# Patient Record
Sex: Female | Born: 1935 | Race: Black or African American | Hispanic: No | Smoking: Former smoker
Health system: Southern US, Community
[De-identification: ages and names within clinical notes are randomized; demographics above are authoritative.]

## PROBLEM LIST (undated history)

## (undated) DIAGNOSIS — I10 Essential (primary) hypertension: Secondary | ICD-10-CM

## (undated) DIAGNOSIS — J449 Chronic obstructive pulmonary disease, unspecified: Secondary | ICD-10-CM

## (undated) DIAGNOSIS — E875 Hyperkalemia: Secondary | ICD-10-CM

## (undated) DIAGNOSIS — J9690 Respiratory failure, unspecified, unspecified whether with hypoxia or hypercapnia: Secondary | ICD-10-CM

## (undated) DIAGNOSIS — N289 Disorder of kidney and ureter, unspecified: Secondary | ICD-10-CM

## (undated) DIAGNOSIS — E119 Type 2 diabetes mellitus without complications: Secondary | ICD-10-CM

## (undated) DIAGNOSIS — J40 Bronchitis, not specified as acute or chronic: Secondary | ICD-10-CM

## (undated) DIAGNOSIS — M1990 Unspecified osteoarthritis, unspecified site: Secondary | ICD-10-CM

## (undated) DIAGNOSIS — F0390 Unspecified dementia without behavioral disturbance: Secondary | ICD-10-CM

## (undated) DIAGNOSIS — I509 Heart failure, unspecified: Secondary | ICD-10-CM

## (undated) HISTORY — DX: Bronchitis, not specified as acute or chronic: J40

## (undated) HISTORY — PX: OTHER SURGICAL HISTORY: SHX169

---

## 1973-06-01 HISTORY — PX: ABDOMINAL HYSTERECTOMY: SHX81

## 2000-10-07 ENCOUNTER — Ambulatory Visit (HOSPITAL_COMMUNITY): Admission: RE | Admit: 2000-10-07 | Discharge: 2000-10-07 | Payer: Self-pay | Admitting: Orthopaedic Surgery

## 2000-11-12 ENCOUNTER — Ambulatory Visit (HOSPITAL_COMMUNITY): Admission: RE | Admit: 2000-11-12 | Discharge: 2000-11-12 | Payer: Self-pay | Admitting: Orthopaedic Surgery

## 2000-11-12 ENCOUNTER — Encounter: Payer: Self-pay | Admitting: Orthopaedic Surgery

## 2000-12-20 ENCOUNTER — Encounter: Payer: Self-pay | Admitting: Family Medicine

## 2000-12-20 ENCOUNTER — Ambulatory Visit (HOSPITAL_COMMUNITY): Admission: RE | Admit: 2000-12-20 | Discharge: 2000-12-20 | Payer: Self-pay | Admitting: Family Medicine

## 2001-06-29 ENCOUNTER — Encounter (HOSPITAL_COMMUNITY): Admission: RE | Admit: 2001-06-29 | Discharge: 2001-07-29 | Payer: Self-pay | Admitting: Orthopedic Surgery

## 2002-01-11 ENCOUNTER — Encounter: Payer: Self-pay | Admitting: Family Medicine

## 2002-01-11 ENCOUNTER — Ambulatory Visit (HOSPITAL_COMMUNITY): Admission: RE | Admit: 2002-01-11 | Discharge: 2002-01-11 | Payer: Self-pay | Admitting: Family Medicine

## 2002-03-01 ENCOUNTER — Encounter: Payer: Self-pay | Admitting: Family Medicine

## 2002-03-01 ENCOUNTER — Ambulatory Visit (HOSPITAL_COMMUNITY): Admission: RE | Admit: 2002-03-01 | Discharge: 2002-03-01 | Payer: Self-pay | Admitting: Family Medicine

## 2002-03-15 ENCOUNTER — Other Ambulatory Visit: Admission: RE | Admit: 2002-03-15 | Discharge: 2002-03-15 | Payer: Self-pay | Admitting: Dermatology

## 2003-03-01 ENCOUNTER — Encounter: Payer: Self-pay | Admitting: Orthopedic Surgery

## 2003-03-14 ENCOUNTER — Encounter: Payer: Self-pay | Admitting: Family Medicine

## 2003-03-14 ENCOUNTER — Ambulatory Visit (HOSPITAL_COMMUNITY): Admission: RE | Admit: 2003-03-14 | Discharge: 2003-03-14 | Payer: Self-pay | Admitting: Family Medicine

## 2003-11-23 ENCOUNTER — Ambulatory Visit (HOSPITAL_COMMUNITY): Admission: RE | Admit: 2003-11-23 | Discharge: 2003-11-23 | Payer: Self-pay | Admitting: Family Medicine

## 2004-03-17 ENCOUNTER — Ambulatory Visit (HOSPITAL_COMMUNITY): Admission: RE | Admit: 2004-03-17 | Discharge: 2004-03-17 | Payer: Self-pay | Admitting: Family Medicine

## 2004-04-21 ENCOUNTER — Ambulatory Visit: Payer: Self-pay | Admitting: Orthopedic Surgery

## 2004-04-30 ENCOUNTER — Ambulatory Visit (HOSPITAL_COMMUNITY): Admission: RE | Admit: 2004-04-30 | Discharge: 2004-04-30 | Payer: Self-pay | Admitting: Orthopedic Surgery

## 2004-07-21 ENCOUNTER — Ambulatory Visit: Payer: Self-pay | Admitting: Orthopedic Surgery

## 2004-07-31 ENCOUNTER — Encounter: Admission: RE | Admit: 2004-07-31 | Discharge: 2004-07-31 | Payer: Self-pay | Admitting: Orthopedic Surgery

## 2004-08-19 ENCOUNTER — Encounter: Admission: RE | Admit: 2004-08-19 | Discharge: 2004-08-19 | Payer: Self-pay | Admitting: Orthopedic Surgery

## 2004-09-04 ENCOUNTER — Encounter: Admission: RE | Admit: 2004-09-04 | Discharge: 2004-09-04 | Payer: Self-pay | Admitting: Orthopedic Surgery

## 2005-01-14 ENCOUNTER — Ambulatory Visit: Payer: Self-pay | Admitting: Orthopedic Surgery

## 2005-03-19 ENCOUNTER — Ambulatory Visit (HOSPITAL_COMMUNITY): Admission: RE | Admit: 2005-03-19 | Discharge: 2005-03-19 | Payer: Self-pay | Admitting: Family Medicine

## 2006-01-06 ENCOUNTER — Ambulatory Visit: Payer: Self-pay | Admitting: Orthopedic Surgery

## 2006-02-11 ENCOUNTER — Ambulatory Visit: Payer: Self-pay | Admitting: Internal Medicine

## 2006-02-15 ENCOUNTER — Encounter (INDEPENDENT_AMBULATORY_CARE_PROVIDER_SITE_OTHER): Payer: Self-pay | Admitting: Internal Medicine

## 2006-03-09 ENCOUNTER — Ambulatory Visit (HOSPITAL_COMMUNITY): Admission: RE | Admit: 2006-03-09 | Discharge: 2006-03-09 | Payer: Self-pay | Admitting: Ophthalmology

## 2006-03-11 ENCOUNTER — Ambulatory Visit: Payer: Self-pay | Admitting: Internal Medicine

## 2006-03-11 LAB — CONVERTED CEMR LAB
ALT: 18 units/L
Alkaline Phosphatase: 73 units/L
Glucose, Bld: 109 mg/dL
Sodium: 141 meq/L
Total Bilirubin: 0.5 mg/dL
Total CHOL/HDL Ratio: 3.1
Total Protein: 7.4 g/dL
Triglycerides: 108 mg/dL
VLDL: 22 mg/dL

## 2006-03-25 ENCOUNTER — Ambulatory Visit (HOSPITAL_COMMUNITY): Admission: RE | Admit: 2006-03-25 | Discharge: 2006-03-25 | Payer: Self-pay | Admitting: Internal Medicine

## 2006-03-26 ENCOUNTER — Encounter (INDEPENDENT_AMBULATORY_CARE_PROVIDER_SITE_OTHER): Payer: Self-pay | Admitting: Internal Medicine

## 2006-04-16 ENCOUNTER — Ambulatory Visit: Payer: Self-pay | Admitting: Internal Medicine

## 2006-04-16 LAB — CONVERTED CEMR LAB
Microalb Creat Ratio: 10.4 mg/g
Microalb, Ur: 2.41 mg/dL

## 2006-04-27 ENCOUNTER — Encounter (INDEPENDENT_AMBULATORY_CARE_PROVIDER_SITE_OTHER): Payer: Self-pay | Admitting: Internal Medicine

## 2006-04-27 DIAGNOSIS — R32 Unspecified urinary incontinence: Secondary | ICD-10-CM

## 2006-04-27 DIAGNOSIS — I10 Essential (primary) hypertension: Secondary | ICD-10-CM | POA: Insufficient documentation

## 2006-04-27 DIAGNOSIS — K219 Gastro-esophageal reflux disease without esophagitis: Secondary | ICD-10-CM | POA: Insufficient documentation

## 2006-04-27 DIAGNOSIS — J45909 Unspecified asthma, uncomplicated: Secondary | ICD-10-CM

## 2006-04-27 DIAGNOSIS — E785 Hyperlipidemia, unspecified: Secondary | ICD-10-CM | POA: Insufficient documentation

## 2006-04-27 DIAGNOSIS — Z8679 Personal history of other diseases of the circulatory system: Secondary | ICD-10-CM | POA: Insufficient documentation

## 2006-04-27 DIAGNOSIS — J309 Allergic rhinitis, unspecified: Secondary | ICD-10-CM | POA: Insufficient documentation

## 2006-04-27 DIAGNOSIS — G5600 Carpal tunnel syndrome, unspecified upper limb: Secondary | ICD-10-CM

## 2006-04-27 DIAGNOSIS — N6019 Diffuse cystic mastopathy of unspecified breast: Secondary | ICD-10-CM

## 2006-04-27 DIAGNOSIS — J42 Unspecified chronic bronchitis: Secondary | ICD-10-CM

## 2006-06-11 ENCOUNTER — Ambulatory Visit: Payer: Self-pay | Admitting: Internal Medicine

## 2006-06-11 LAB — CONVERTED CEMR LAB: Hgb A1c MFr Bld: 5.6 %

## 2006-06-14 ENCOUNTER — Encounter (INDEPENDENT_AMBULATORY_CARE_PROVIDER_SITE_OTHER): Payer: Self-pay | Admitting: Internal Medicine

## 2006-06-21 ENCOUNTER — Encounter (INDEPENDENT_AMBULATORY_CARE_PROVIDER_SITE_OTHER): Payer: Self-pay | Admitting: Internal Medicine

## 2006-08-02 ENCOUNTER — Encounter (INDEPENDENT_AMBULATORY_CARE_PROVIDER_SITE_OTHER): Payer: Self-pay | Admitting: Internal Medicine

## 2006-08-25 ENCOUNTER — Telehealth (INDEPENDENT_AMBULATORY_CARE_PROVIDER_SITE_OTHER): Payer: Self-pay | Admitting: *Deleted

## 2006-09-10 ENCOUNTER — Ambulatory Visit: Payer: Self-pay | Admitting: Internal Medicine

## 2006-09-10 DIAGNOSIS — R634 Abnormal weight loss: Secondary | ICD-10-CM

## 2006-09-10 DIAGNOSIS — R42 Dizziness and giddiness: Secondary | ICD-10-CM | POA: Insufficient documentation

## 2006-09-10 LAB — CONVERTED CEMR LAB: Hgb A1c MFr Bld: 5.9 %

## 2006-09-13 LAB — CONVERTED CEMR LAB
ALT: 10 units/L (ref 0–35)
Albumin: 4.2 g/dL (ref 3.5–5.2)
Basophils Absolute: 0 10*3/uL (ref 0.0–0.1)
CO2: 26 meq/L (ref 19–32)
Calcium: 9.6 mg/dL (ref 8.4–10.5)
Chloride: 101 meq/L (ref 96–112)
Hemoglobin: 13.1 g/dL (ref 12.0–15.0)
Lymphocytes Relative: 42 % (ref 12–46)
Monocytes Absolute: 0.5 10*3/uL (ref 0.2–0.7)
Neutro Abs: 3.3 10*3/uL (ref 1.7–7.7)
Platelets: 243 10*3/uL (ref 150–400)
RDW: 14.2 % — ABNORMAL HIGH (ref 11.5–14.0)
Sodium: 141 meq/L (ref 135–145)
TSH: 0.825 microintl units/mL (ref 0.350–5.50)
Total Protein: 7.4 g/dL (ref 6.0–8.3)

## 2006-09-17 ENCOUNTER — Ambulatory Visit (HOSPITAL_COMMUNITY): Admission: RE | Admit: 2006-09-17 | Discharge: 2006-09-17 | Payer: Self-pay | Admitting: Family Medicine

## 2006-09-22 ENCOUNTER — Encounter (INDEPENDENT_AMBULATORY_CARE_PROVIDER_SITE_OTHER): Payer: Self-pay | Admitting: Internal Medicine

## 2006-09-24 ENCOUNTER — Ambulatory Visit: Payer: Self-pay | Admitting: Internal Medicine

## 2006-09-24 DIAGNOSIS — J45901 Unspecified asthma with (acute) exacerbation: Secondary | ICD-10-CM | POA: Insufficient documentation

## 2006-10-05 ENCOUNTER — Encounter (INDEPENDENT_AMBULATORY_CARE_PROVIDER_SITE_OTHER): Payer: Self-pay | Admitting: Internal Medicine

## 2006-10-08 ENCOUNTER — Ambulatory Visit: Payer: Self-pay | Admitting: Internal Medicine

## 2006-10-08 DIAGNOSIS — K5909 Other constipation: Secondary | ICD-10-CM

## 2006-10-11 ENCOUNTER — Encounter (INDEPENDENT_AMBULATORY_CARE_PROVIDER_SITE_OTHER): Payer: Self-pay | Admitting: Internal Medicine

## 2006-11-05 ENCOUNTER — Ambulatory Visit: Payer: Self-pay | Admitting: Internal Medicine

## 2006-11-05 DIAGNOSIS — F0390 Unspecified dementia without behavioral disturbance: Secondary | ICD-10-CM

## 2006-11-29 ENCOUNTER — Ambulatory Visit: Payer: Self-pay | Admitting: Internal Medicine

## 2006-11-29 DIAGNOSIS — F329 Major depressive disorder, single episode, unspecified: Secondary | ICD-10-CM

## 2006-11-30 ENCOUNTER — Telehealth (INDEPENDENT_AMBULATORY_CARE_PROVIDER_SITE_OTHER): Payer: Self-pay | Admitting: Internal Medicine

## 2006-12-01 ENCOUNTER — Encounter (INDEPENDENT_AMBULATORY_CARE_PROVIDER_SITE_OTHER): Payer: Self-pay | Admitting: Internal Medicine

## 2006-12-28 ENCOUNTER — Ambulatory Visit: Payer: Self-pay | Admitting: Internal Medicine

## 2006-12-28 DIAGNOSIS — M6530 Trigger finger, unspecified finger: Secondary | ICD-10-CM | POA: Insufficient documentation

## 2007-01-03 ENCOUNTER — Ambulatory Visit: Payer: Self-pay | Admitting: Orthopedic Surgery

## 2007-01-03 ENCOUNTER — Encounter (INDEPENDENT_AMBULATORY_CARE_PROVIDER_SITE_OTHER): Payer: Self-pay | Admitting: Internal Medicine

## 2007-01-10 ENCOUNTER — Telehealth (INDEPENDENT_AMBULATORY_CARE_PROVIDER_SITE_OTHER): Payer: Self-pay | Admitting: *Deleted

## 2007-01-22 ENCOUNTER — Encounter (INDEPENDENT_AMBULATORY_CARE_PROVIDER_SITE_OTHER): Payer: Self-pay | Admitting: Internal Medicine

## 2007-01-23 ENCOUNTER — Encounter (INDEPENDENT_AMBULATORY_CARE_PROVIDER_SITE_OTHER): Payer: Self-pay | Admitting: Internal Medicine

## 2007-02-02 ENCOUNTER — Ambulatory Visit: Payer: Self-pay | Admitting: Internal Medicine

## 2007-02-08 ENCOUNTER — Ambulatory Visit: Payer: Self-pay | Admitting: Internal Medicine

## 2007-02-08 ENCOUNTER — Telehealth (INDEPENDENT_AMBULATORY_CARE_PROVIDER_SITE_OTHER): Payer: Self-pay | Admitting: *Deleted

## 2007-02-08 DIAGNOSIS — E1149 Type 2 diabetes mellitus with other diabetic neurological complication: Secondary | ICD-10-CM | POA: Insufficient documentation

## 2007-02-10 ENCOUNTER — Telehealth (INDEPENDENT_AMBULATORY_CARE_PROVIDER_SITE_OTHER): Payer: Self-pay | Admitting: *Deleted

## 2007-03-03 ENCOUNTER — Encounter (INDEPENDENT_AMBULATORY_CARE_PROVIDER_SITE_OTHER): Payer: Self-pay | Admitting: Internal Medicine

## 2007-03-25 ENCOUNTER — Ambulatory Visit: Payer: Self-pay | Admitting: Internal Medicine

## 2007-03-28 ENCOUNTER — Ambulatory Visit (HOSPITAL_COMMUNITY): Admission: RE | Admit: 2007-03-28 | Discharge: 2007-03-28 | Payer: Self-pay | Admitting: Internal Medicine

## 2007-03-29 ENCOUNTER — Telehealth (INDEPENDENT_AMBULATORY_CARE_PROVIDER_SITE_OTHER): Payer: Self-pay | Admitting: *Deleted

## 2007-03-29 ENCOUNTER — Ambulatory Visit: Payer: Self-pay | Admitting: Internal Medicine

## 2007-03-30 ENCOUNTER — Encounter (INDEPENDENT_AMBULATORY_CARE_PROVIDER_SITE_OTHER): Payer: Self-pay | Admitting: Internal Medicine

## 2007-03-30 ENCOUNTER — Telehealth (INDEPENDENT_AMBULATORY_CARE_PROVIDER_SITE_OTHER): Payer: Self-pay | Admitting: *Deleted

## 2007-03-30 LAB — CONVERTED CEMR LAB
Albumin: 4.1 g/dL (ref 3.5–5.2)
Alkaline Phosphatase: 103 units/L (ref 39–117)
BUN: 19 mg/dL (ref 6–23)
CO2: 27 meq/L (ref 19–32)
Cholesterol: 138 mg/dL (ref 0–200)
Eosinophils Absolute: 0.2 10*3/uL (ref 0.0–0.7)
Eosinophils Relative: 3 % (ref 0–5)
Glucose, Bld: 102 mg/dL — ABNORMAL HIGH (ref 70–99)
HCT: 37.7 % (ref 36.0–46.0)
HDL: 45 mg/dL (ref >39)
LDL Cholesterol: 68 mg/dL (ref 0–99)
Lymphs Abs: 2.9 10*3/uL (ref 0.7–3.3)
MCV: 82.5 fL (ref 78.0–100.0)
Monocytes Relative: 8 % (ref 3–11)
Potassium: 3.7 meq/L (ref 3.5–5.3)
RBC: 4.57 M/uL (ref 3.87–5.11)
Total Bilirubin: 0.3 mg/dL (ref 0.3–1.2)
Triglycerides: 126 mg/dL (ref <150)
WBC: 6.7 10*3/uL (ref 4.0–10.5)

## 2007-04-13 ENCOUNTER — Encounter (INDEPENDENT_AMBULATORY_CARE_PROVIDER_SITE_OTHER): Payer: Self-pay | Admitting: Internal Medicine

## 2007-04-19 ENCOUNTER — Encounter (INDEPENDENT_AMBULATORY_CARE_PROVIDER_SITE_OTHER): Payer: Self-pay | Admitting: Internal Medicine

## 2007-05-19 ENCOUNTER — Encounter (INDEPENDENT_AMBULATORY_CARE_PROVIDER_SITE_OTHER): Payer: Self-pay | Admitting: Internal Medicine

## 2007-05-23 ENCOUNTER — Encounter (INDEPENDENT_AMBULATORY_CARE_PROVIDER_SITE_OTHER): Payer: Self-pay | Admitting: Internal Medicine

## 2007-05-31 ENCOUNTER — Encounter (INDEPENDENT_AMBULATORY_CARE_PROVIDER_SITE_OTHER): Payer: Self-pay | Admitting: Internal Medicine

## 2007-06-03 ENCOUNTER — Encounter (INDEPENDENT_AMBULATORY_CARE_PROVIDER_SITE_OTHER): Payer: Self-pay | Admitting: Internal Medicine

## 2007-06-06 ENCOUNTER — Ambulatory Visit: Payer: Self-pay | Admitting: Internal Medicine

## 2007-06-14 ENCOUNTER — Ambulatory Visit (HOSPITAL_COMMUNITY): Admission: RE | Admit: 2007-06-14 | Discharge: 2007-06-14 | Payer: Self-pay | Admitting: Ophthalmology

## 2007-06-28 ENCOUNTER — Ambulatory Visit: Payer: Self-pay | Admitting: Internal Medicine

## 2007-06-29 ENCOUNTER — Telehealth (INDEPENDENT_AMBULATORY_CARE_PROVIDER_SITE_OTHER): Payer: Self-pay | Admitting: *Deleted

## 2007-07-11 ENCOUNTER — Encounter (INDEPENDENT_AMBULATORY_CARE_PROVIDER_SITE_OTHER): Payer: Self-pay | Admitting: Internal Medicine

## 2007-07-29 ENCOUNTER — Encounter (INDEPENDENT_AMBULATORY_CARE_PROVIDER_SITE_OTHER): Payer: Self-pay | Admitting: Internal Medicine

## 2007-09-26 ENCOUNTER — Ambulatory Visit: Payer: Self-pay | Admitting: Internal Medicine

## 2007-09-26 LAB — CONVERTED CEMR LAB: Hgb A1c MFr Bld: 5.9 %

## 2007-09-27 ENCOUNTER — Encounter (INDEPENDENT_AMBULATORY_CARE_PROVIDER_SITE_OTHER): Payer: Self-pay | Admitting: Internal Medicine

## 2007-09-27 ENCOUNTER — Telehealth (INDEPENDENT_AMBULATORY_CARE_PROVIDER_SITE_OTHER): Payer: Self-pay | Admitting: *Deleted

## 2007-09-27 LAB — CONVERTED CEMR LAB
ALT: 14 units/L (ref 0–35)
AST: 20 units/L (ref 0–37)
Albumin: 3.9 g/dL (ref 3.5–5.2)
CO2: 25 meq/L (ref 19–32)
Calcium: 9 mg/dL (ref 8.4–10.5)
Chloride: 103 meq/L (ref 96–112)
Cholesterol: 121 mg/dL (ref 0–200)
Creatinine, Ser: 1.1 mg/dL (ref 0.40–1.20)
Potassium: 4 meq/L (ref 3.5–5.3)
Sodium: 142 meq/L (ref 135–145)
Total Protein: 7.4 g/dL (ref 6.0–8.3)

## 2007-09-30 ENCOUNTER — Encounter (INDEPENDENT_AMBULATORY_CARE_PROVIDER_SITE_OTHER): Payer: Self-pay | Admitting: Internal Medicine

## 2007-10-03 ENCOUNTER — Ambulatory Visit: Payer: Self-pay | Admitting: Internal Medicine

## 2007-11-14 ENCOUNTER — Ambulatory Visit: Payer: Self-pay | Admitting: Orthopedic Surgery

## 2007-11-14 DIAGNOSIS — S73119A Iliofemoral ligament sprain of unspecified hip, initial encounter: Secondary | ICD-10-CM | POA: Insufficient documentation

## 2007-12-01 ENCOUNTER — Ambulatory Visit: Payer: Self-pay | Admitting: Internal Medicine

## 2007-12-01 LAB — CONVERTED CEMR LAB
Blood Glucose, Fingerstick: 124
Hgb A1c MFr Bld: 5.9 %

## 2007-12-13 ENCOUNTER — Telehealth (INDEPENDENT_AMBULATORY_CARE_PROVIDER_SITE_OTHER): Payer: Self-pay | Admitting: Internal Medicine

## 2007-12-14 ENCOUNTER — Encounter (INDEPENDENT_AMBULATORY_CARE_PROVIDER_SITE_OTHER): Payer: Self-pay | Admitting: Internal Medicine

## 2007-12-26 ENCOUNTER — Ambulatory Visit: Payer: Self-pay | Admitting: Internal Medicine

## 2008-01-16 ENCOUNTER — Encounter (INDEPENDENT_AMBULATORY_CARE_PROVIDER_SITE_OTHER): Payer: Self-pay | Admitting: Internal Medicine

## 2008-01-31 ENCOUNTER — Encounter (INDEPENDENT_AMBULATORY_CARE_PROVIDER_SITE_OTHER): Payer: Self-pay | Admitting: Internal Medicine

## 2008-02-07 ENCOUNTER — Telehealth (INDEPENDENT_AMBULATORY_CARE_PROVIDER_SITE_OTHER): Payer: Self-pay | Admitting: *Deleted

## 2008-02-08 ENCOUNTER — Ambulatory Visit: Payer: Self-pay | Admitting: Internal Medicine

## 2008-02-15 ENCOUNTER — Ambulatory Visit: Payer: Self-pay | Admitting: Internal Medicine

## 2008-02-15 ENCOUNTER — Ambulatory Visit (HOSPITAL_COMMUNITY): Admission: RE | Admit: 2008-02-15 | Discharge: 2008-02-15 | Payer: Self-pay | Admitting: Internal Medicine

## 2008-02-15 DIAGNOSIS — R05 Cough: Secondary | ICD-10-CM | POA: Insufficient documentation

## 2008-02-22 ENCOUNTER — Ambulatory Visit: Payer: Self-pay | Admitting: Internal Medicine

## 2008-02-28 ENCOUNTER — Ambulatory Visit: Payer: Self-pay | Admitting: Internal Medicine

## 2008-03-07 ENCOUNTER — Ambulatory Visit: Payer: Self-pay | Admitting: Internal Medicine

## 2008-03-07 LAB — CONVERTED CEMR LAB
Blood Glucose, Fingerstick: 203
Hgb A1c MFr Bld: 6.2 %

## 2008-03-22 ENCOUNTER — Encounter (INDEPENDENT_AMBULATORY_CARE_PROVIDER_SITE_OTHER): Payer: Self-pay | Admitting: Internal Medicine

## 2008-03-27 ENCOUNTER — Ambulatory Visit: Payer: Self-pay | Admitting: Internal Medicine

## 2008-03-28 ENCOUNTER — Encounter (INDEPENDENT_AMBULATORY_CARE_PROVIDER_SITE_OTHER): Payer: Self-pay | Admitting: Internal Medicine

## 2008-03-28 ENCOUNTER — Ambulatory Visit (HOSPITAL_COMMUNITY): Admission: RE | Admit: 2008-03-28 | Discharge: 2008-03-28 | Payer: Self-pay | Admitting: Internal Medicine

## 2008-03-29 LAB — CONVERTED CEMR LAB
AST: 23 units/L (ref 0–37)
Albumin: 4 g/dL (ref 3.5–5.2)
Alkaline Phosphatase: 80 units/L (ref 39–117)
LDL Cholesterol: 63 mg/dL (ref 0–99)
Potassium: 3.9 meq/L (ref 3.5–5.3)
Sodium: 143 meq/L (ref 135–145)
Total Bilirubin: 0.4 mg/dL (ref 0.3–1.2)
Total Protein: 6.8 g/dL (ref 6.0–8.3)
Triglycerides: 91 mg/dL (ref <150)
VLDL: 18 mg/dL (ref 0–40)

## 2008-04-04 ENCOUNTER — Encounter (INDEPENDENT_AMBULATORY_CARE_PROVIDER_SITE_OTHER): Payer: Self-pay | Admitting: Internal Medicine

## 2008-04-10 ENCOUNTER — Telehealth (INDEPENDENT_AMBULATORY_CARE_PROVIDER_SITE_OTHER): Payer: Self-pay | Admitting: Internal Medicine

## 2008-04-24 ENCOUNTER — Encounter (INDEPENDENT_AMBULATORY_CARE_PROVIDER_SITE_OTHER): Payer: Self-pay | Admitting: Internal Medicine

## 2008-05-22 ENCOUNTER — Encounter (INDEPENDENT_AMBULATORY_CARE_PROVIDER_SITE_OTHER): Payer: Self-pay | Admitting: Internal Medicine

## 2008-06-04 ENCOUNTER — Telehealth (INDEPENDENT_AMBULATORY_CARE_PROVIDER_SITE_OTHER): Payer: Self-pay | Admitting: *Deleted

## 2008-06-07 ENCOUNTER — Ambulatory Visit: Payer: Self-pay | Admitting: Internal Medicine

## 2008-06-07 LAB — CONVERTED CEMR LAB: Blood Glucose, Fingerstick: 116

## 2008-06-13 ENCOUNTER — Ambulatory Visit: Payer: Self-pay | Admitting: Internal Medicine

## 2008-06-19 ENCOUNTER — Telehealth (INDEPENDENT_AMBULATORY_CARE_PROVIDER_SITE_OTHER): Payer: Self-pay | Admitting: *Deleted

## 2008-08-08 ENCOUNTER — Encounter (INDEPENDENT_AMBULATORY_CARE_PROVIDER_SITE_OTHER): Payer: Self-pay | Admitting: Internal Medicine

## 2008-08-10 ENCOUNTER — Ambulatory Visit: Payer: Self-pay | Admitting: Internal Medicine

## 2008-09-06 ENCOUNTER — Ambulatory Visit: Payer: Self-pay | Admitting: Internal Medicine

## 2008-09-06 DIAGNOSIS — R635 Abnormal weight gain: Secondary | ICD-10-CM | POA: Insufficient documentation

## 2008-09-06 LAB — CONVERTED CEMR LAB
Blood Glucose, Fingerstick: 161
Hgb A1c MFr Bld: 6.1 %

## 2008-09-07 ENCOUNTER — Encounter (INDEPENDENT_AMBULATORY_CARE_PROVIDER_SITE_OTHER): Payer: Self-pay | Admitting: Internal Medicine

## 2008-09-07 LAB — CONVERTED CEMR LAB
AST: 20 units/L (ref 0–37)
Alkaline Phosphatase: 86 units/L (ref 39–117)
BUN: 23 mg/dL (ref 6–23)
Basophils Absolute: 0 10*3/uL (ref 0.0–0.1)
Basophils Relative: 0 % (ref 0–1)
Eosinophils Relative: 4 % (ref 0–5)
Glucose, Bld: 139 mg/dL — ABNORMAL HIGH (ref 70–99)
HCT: 38.4 % (ref 36.0–46.0)
HDL: 44 mg/dL (ref >39)
Hemoglobin: 13 g/dL (ref 12.0–15.0)
LDL Cholesterol: 55 mg/dL (ref 0–99)
Lymphocytes Relative: 38 % (ref 12–46)
MCHC: 33.9 g/dL (ref 30.0–36.0)
Monocytes Absolute: 0.5 10*3/uL (ref 0.1–1.0)
Neutro Abs: 3.8 10*3/uL (ref 1.7–7.7)
Platelets: 228 10*3/uL (ref 150–400)
RDW: 13.2 % (ref 11.5–15.5)
TSH: 1.861 microintl units/mL (ref 0.350–4.500)
Total Bilirubin: 0.4 mg/dL (ref 0.3–1.2)
Total CHOL/HDL Ratio: 2.8
Triglycerides: 117 mg/dL (ref <150)
VLDL: 23 mg/dL (ref 0–40)

## 2008-09-18 ENCOUNTER — Telehealth (INDEPENDENT_AMBULATORY_CARE_PROVIDER_SITE_OTHER): Payer: Self-pay | Admitting: Internal Medicine

## 2008-10-03 ENCOUNTER — Ambulatory Visit: Payer: Self-pay | Admitting: Internal Medicine

## 2008-10-04 ENCOUNTER — Encounter (INDEPENDENT_AMBULATORY_CARE_PROVIDER_SITE_OTHER): Payer: Self-pay | Admitting: Internal Medicine

## 2008-11-08 ENCOUNTER — Ambulatory Visit: Payer: Self-pay | Admitting: Orthopedic Surgery

## 2008-11-08 DIAGNOSIS — M25519 Pain in unspecified shoulder: Secondary | ICD-10-CM

## 2008-11-08 DIAGNOSIS — M7580 Other shoulder lesions, unspecified shoulder: Secondary | ICD-10-CM

## 2008-11-14 ENCOUNTER — Encounter (INDEPENDENT_AMBULATORY_CARE_PROVIDER_SITE_OTHER): Payer: Self-pay | Admitting: Internal Medicine

## 2008-11-15 ENCOUNTER — Ambulatory Visit: Payer: Self-pay | Admitting: Internal Medicine

## 2008-12-12 ENCOUNTER — Ambulatory Visit: Payer: Self-pay | Admitting: Internal Medicine

## 2008-12-13 ENCOUNTER — Encounter (INDEPENDENT_AMBULATORY_CARE_PROVIDER_SITE_OTHER): Payer: Self-pay | Admitting: Internal Medicine

## 2008-12-13 LAB — CONVERTED CEMR LAB: Microalb, Ur: 1.14 mg/dL (ref 0.00–1.89)

## 2008-12-18 ENCOUNTER — Encounter (INDEPENDENT_AMBULATORY_CARE_PROVIDER_SITE_OTHER): Payer: Self-pay | Admitting: Internal Medicine

## 2008-12-27 ENCOUNTER — Encounter (INDEPENDENT_AMBULATORY_CARE_PROVIDER_SITE_OTHER): Payer: Self-pay | Admitting: Internal Medicine

## 2009-01-14 ENCOUNTER — Ambulatory Visit (HOSPITAL_COMMUNITY): Admission: RE | Admit: 2009-01-14 | Discharge: 2009-01-14 | Payer: Self-pay | Admitting: Ophthalmology

## 2009-01-18 ENCOUNTER — Encounter (INDEPENDENT_AMBULATORY_CARE_PROVIDER_SITE_OTHER): Payer: Self-pay | Admitting: Internal Medicine

## 2009-01-23 ENCOUNTER — Ambulatory Visit: Payer: Self-pay | Admitting: Internal Medicine

## 2009-01-28 ENCOUNTER — Ambulatory Visit: Payer: Self-pay | Admitting: Internal Medicine

## 2009-01-31 ENCOUNTER — Encounter (INDEPENDENT_AMBULATORY_CARE_PROVIDER_SITE_OTHER): Payer: Self-pay | Admitting: Internal Medicine

## 2009-04-08 ENCOUNTER — Ambulatory Visit (HOSPITAL_COMMUNITY): Admission: RE | Admit: 2009-04-08 | Discharge: 2009-04-08 | Payer: Self-pay | Admitting: Internal Medicine

## 2009-11-29 ENCOUNTER — Ambulatory Visit (HOSPITAL_COMMUNITY): Admission: RE | Admit: 2009-11-29 | Discharge: 2009-11-29 | Payer: Self-pay | Admitting: Internal Medicine

## 2010-02-28 ENCOUNTER — Ambulatory Visit (HOSPITAL_COMMUNITY): Admission: RE | Admit: 2010-02-28 | Discharge: 2010-02-28 | Payer: Self-pay | Admitting: Internal Medicine

## 2010-04-10 ENCOUNTER — Ambulatory Visit (HOSPITAL_COMMUNITY): Admission: RE | Admit: 2010-04-10 | Discharge: 2010-04-10 | Payer: Self-pay | Admitting: Internal Medicine

## 2010-04-15 ENCOUNTER — Ambulatory Visit: Payer: Self-pay | Admitting: Orthopedic Surgery

## 2010-04-15 DIAGNOSIS — M25539 Pain in unspecified wrist: Secondary | ICD-10-CM

## 2010-07-01 NOTE — Assessment & Plan Note (Signed)
Summary: LEFT HAND/WRIST NUMBNESS/MCR/MCD/BSF   Vital Signs:  Patient profile:   77 year old female Height:      66 inches Weight:      220 pounds Pulse rate:   76 / minute Resp:     18 per minute  Vitals Entered By: Fuller Canada MD (05/02/2010 10:27 AM)  Visit Type:  new problem Referring Provider:  self Primary Provider:  Dr. Felecia Shelling  CC:  left wrist pain.  History of Present Illness: I saw Brittney Li in the office today for a new problem visit.  She is a 77 years old woman with the complaint of:  left wrist pain and swelling.  No injury.  Xrays today.  Meds: HCTZ, Pravastatin, Lotrel, Famotidine, ASA, Tramadol, Mirtazapine, Aricept, Albuterol, Nebulizer as needed.   Brittney Li presents with throbbing intermittent pain in the LEFT wrist with swelling over the dorsum of LEFT wrist. Status post LEFT carpal tunnel syndrome and released in each [place] years ago.  She denies any trauma.   Allergies: 1)  Penicillin 2)  Erythromycin 3)  * Flu Shots  Past History:  Past Medical History: Last updated: 12/12/2008 Allergic rhinitis Asthma GERD Hyperlipidemia Hypertension Cerebrovascular accident, hx of-memory problems-right side-1989 Urinary incontinence bronchitis arthritis-right knee carpal tunnel NIDDM, controlled, with neuropathy fibrocystic breast mild dementia-MME 27 trauma right leg-1979  Past Surgical History: Last updated: 06/28/2007 Carpal tunnel release-2002/2004 Cataract extraction-left with lens implant-03/2006 right--1/09 Hysterectomy-secondary to bleeding-1966 right knee-1979  Family History: Last updated: 05-02-10 father-deceased-1935 mother-deceased-1965 sister x1-deceased brother x3-deceased son-60-asthma, "heart weak" Family History Coronary Heart Disease female < 24 Family History of Arthritis  Social History: Last updated: May 02, 2010 Divorced lives alone retired Former Smoker-3/4ppd x52 years-quit 12/01 Alcohol  use-no Drug use-no caffeine occasionally 10th grade ed  Risk Factors: Smoking Status: quit (09/10/2006) Packs/Day: 0.75 (04/27/2006)  Family History: father-deceased-1935 mother-deceased-1965 sister x1-deceased brother x3-deceased son-60-asthma, "heart weak" Family History Coronary Heart Disease female < 15 Family History of Arthritis  Social History: Divorced lives alone retired Former Smoker-3/4ppd x52 years-quit 12/01 Alcohol use-no Drug use-no caffeine occasionally 10th grade ed  Review of Systems Respiratory:  Complains of short of breath, wheezing, couch, tightness, and snoring; denies pain on inspiration and snoring . Neurologic:  Complains of numbness and tingling; denies unsteady gait, dizziness, tremors, and seizure. Musculoskeletal:  Complains of joint pain, swelling, instability, and stiffness; denies redness, heat, and muscle pain. Endocrine:  Complains of heat or cold intolerance; denies excessive thirst and exessive urination. Psychiatric:  Complains of depression; denies nervousness, anxiety, and hallucinations.  The review of systems is negative for Constitutional, Cardiovascular, Gastrointestinal, Genitourinary, Skin, HEENT, Immunology, and Hemoatologic.  Physical Exam  Additional Exam:  abnormal grooming and hygiene.  She's ambulating with a LEFT cane. She is oriented x3. Mood and affect are normal.  She has swelling and tenderness over the dorsum of the wrist and swelling in the wrist joint between the 2nd and 3rd metacarpals and the carpal bones. She has normal extension, maybe a slight decrease in flexion, normal range of motion.  Her skin incision from the carpal tunnel releases. Normal.  There is normal. Sensation in the hand. The wrist is stable.     Impression & Recommendations: 3 views, LEFT wrist.  There mild degenerative changes noted overall normal looking wrist.  Impression normal wrist  Other Orders: Est. Patient Level III  (16109) Wrist x-ray complete, minimum 3 views (60454)  Patient Instructions: 1)  Wear brace x 1 month  2)  Please schedule  a follow-up appointment as needed.   Orders Added: 1)  Est. Patient Level III [10272] 2)  Wrist x-ray complete, minimum 3 views [73110]

## 2010-07-01 NOTE — Letter (Signed)
Summary: History form  History form   Imported By: Jacklynn Ganong 04/16/2010 13:39:11  _____________________________________________________________________  External Attachment:    Type:   Image     Comment:   External Document

## 2010-09-19 ENCOUNTER — Ambulatory Visit (HOSPITAL_COMMUNITY)
Admission: RE | Admit: 2010-09-19 | Discharge: 2010-09-19 | Disposition: A | Discharge status: Home / Self Care | Payer: Medicare Other | Source: Ambulatory Visit | Attending: Internal Medicine | Admitting: Internal Medicine

## 2010-09-19 ENCOUNTER — Other Ambulatory Visit (HOSPITAL_COMMUNITY): Payer: Self-pay | Admitting: Internal Medicine

## 2010-09-19 DIAGNOSIS — J40 Bronchitis, not specified as acute or chronic: Secondary | ICD-10-CM

## 2010-09-19 DIAGNOSIS — E119 Type 2 diabetes mellitus without complications: Secondary | ICD-10-CM | POA: Insufficient documentation

## 2010-09-19 DIAGNOSIS — I10 Essential (primary) hypertension: Secondary | ICD-10-CM | POA: Insufficient documentation

## 2011-01-06 ENCOUNTER — Encounter: Payer: Self-pay | Admitting: Orthopedic Surgery

## 2011-01-06 ENCOUNTER — Ambulatory Visit (INDEPENDENT_AMBULATORY_CARE_PROVIDER_SITE_OTHER): Payer: Medicare Other | Admitting: Orthopedic Surgery

## 2011-01-06 DIAGNOSIS — M1990 Unspecified osteoarthritis, unspecified site: Secondary | ICD-10-CM

## 2011-01-06 DIAGNOSIS — M129 Arthropathy, unspecified: Secondary | ICD-10-CM

## 2011-01-06 DIAGNOSIS — M19032 Primary osteoarthritis, left wrist: Secondary | ICD-10-CM | POA: Insufficient documentation

## 2011-01-06 DIAGNOSIS — M19039 Primary osteoarthritis, unspecified wrist: Secondary | ICD-10-CM

## 2011-01-06 MED ORDER — DICLOFENAC POTASSIUM 50 MG PO TABS: 50.0000 mg | ORAL_TABLET | Freq: Two times a day (BID) | ORAL | Status: AC

## 2011-01-06 NOTE — Patient Instructions (Signed)
Take new medication for Arthritis  Wear brace   Come back in 6 weeks

## 2011-01-06 NOTE — Progress Notes (Signed)
Chief complaint LEFT hand swelling  This 77 year old female who is an est. Patient presents with a two-week history of gradual onset of sharp pain over the dorsum of the wrist associated with swelling and a possible ganglion.  Her pain is 9/10 she is to come and go and is worse at night but relieved by bracing.  She was on some prednisone for another reason and this seemed to help her hand  She reports swelling over the dorsum of the wrist and pain radiating from the wrist into the LEFT long finger  She has a negative review of systems for cardiovascular GU complaints does complain of some nausea from medication since a week wheezing and tightness in her chest.  She is followed by Dr. Earvin Hansen he'll.  GENERAL: normal development   CDV: pulses are normal   Skin: normal  Psychiatric: awake, alert and oriented  Neuro: normal sensation  MSK left hand *left hand and wrist:Inspection reveals a dorsal wrist ganglion over the wrist joint.  Her range of motion is normal but she's tender over the wrist joint and she does have tenderness in the LEFT long finger.  There is no wrist instability there is some weakness to grip skin is intact.    Assessment:  X-rays: Show dorsal and intercarpal wrist arthritis    Plan: Start diclofenac 50 mg twice a day continue bracing  X-ray report to separate and identifiable  3 views LEFT wrist.  There is cyst forming in the intercarpal joints of the wrist consistent with osteoarthritis of the hand and wrist joint.

## 2011-01-15 ENCOUNTER — Telehealth: Payer: Self-pay | Admitting: Orthopedic Surgery

## 2011-01-15 NOTE — Telephone Encounter (Signed)
Called patient, relayed. °

## 2011-01-15 NOTE — Telephone Encounter (Signed)
Patient called to relay that she has discontinued the medication diclofenac (CATAFLAM) 50 MG tablet, as of 3 days ago due to side effect of constipation.  She is asking if there is another medication she may take instead that may not have same side effect.  Her pharmacy is [place] and she gets home delivery.  Her ph# is [phone].

## 2011-01-15 NOTE — Telephone Encounter (Signed)
She can take ibuprofen 800 mg 3 times a day over the counter

## 2011-02-17 ENCOUNTER — Ambulatory Visit (INDEPENDENT_AMBULATORY_CARE_PROVIDER_SITE_OTHER): Payer: Medicare Other | Admitting: Orthopedic Surgery

## 2011-02-17 ENCOUNTER — Encounter: Payer: Self-pay | Admitting: Orthopedic Surgery

## 2011-02-17 DIAGNOSIS — M19049 Primary osteoarthritis, unspecified hand: Secondary | ICD-10-CM

## 2011-02-17 NOTE — Progress Notes (Signed)
Followup visit. LEFT hand previously placed in a LEFT wrist splint.  No major change since her last visit. She does note increased pain, when it's raining like today.  Review of systems multiple joint aches.  She does have a dorsal ganglion, which is tender for her.  Her range of motion in the hand and wrist are normal. She has tenderness over the ganglion, as well as the carpal bones, and wrist joint with mild swelling. Neurovascular exam is intact.  Impression dorsal ganglion. Impression #2 arthritis of the wrist and hand.  Plan continue ibuprofen for pain also at capsaicin cream 3 times a day.  If she would like the ganglion removed we'll refer her for that,  She'll followup as needed

## 2011-02-18 LAB — BASIC METABOLIC PANEL
Calcium: 9.3
Creatinine, Ser: 0.84
GFR calc Af Amer: >60
GFR calc non Af Amer: >60
Sodium: 140

## 2011-02-18 LAB — HEMOGLOBIN AND HEMATOCRIT, BLOOD
HCT: 40.2
Hemoglobin: 13.4

## 2011-04-09 ENCOUNTER — Other Ambulatory Visit (HOSPITAL_COMMUNITY): Payer: Self-pay | Admitting: Internal Medicine

## 2011-04-09 DIAGNOSIS — Z139 Encounter for screening, unspecified: Secondary | ICD-10-CM

## 2011-04-17 ENCOUNTER — Ambulatory Visit (HOSPITAL_COMMUNITY)
Admission: RE | Admit: 2011-04-17 | Discharge: 2011-04-17 | Disposition: A | Discharge status: Home / Self Care | Payer: Medicare Other | Source: Ambulatory Visit | Attending: Internal Medicine | Admitting: Internal Medicine

## 2011-04-17 DIAGNOSIS — Z1231 Encounter for screening mammogram for malignant neoplasm of breast: Secondary | ICD-10-CM | POA: Insufficient documentation

## 2011-04-17 DIAGNOSIS — Z139 Encounter for screening, unspecified: Secondary | ICD-10-CM

## 2011-11-06 DIAGNOSIS — N39498 Other specified urinary incontinence: Secondary | ICD-10-CM | POA: Diagnosis not present

## 2011-12-01 DIAGNOSIS — E7800 Pure hypercholesterolemia, unspecified: Secondary | ICD-10-CM | POA: Diagnosis not present

## 2011-12-01 DIAGNOSIS — M1990 Unspecified osteoarthritis, unspecified site: Secondary | ICD-10-CM | POA: Diagnosis not present

## 2011-12-01 DIAGNOSIS — I10 Essential (primary) hypertension: Secondary | ICD-10-CM | POA: Diagnosis not present

## 2011-12-01 DIAGNOSIS — Z0000 Encounter for general adult medical examination without abnormal findings: Secondary | ICD-10-CM | POA: Diagnosis not present

## 2011-12-01 DIAGNOSIS — R7309 Other abnormal glucose: Secondary | ICD-10-CM | POA: Diagnosis not present

## 2011-12-01 DIAGNOSIS — E119 Type 2 diabetes mellitus without complications: Secondary | ICD-10-CM | POA: Diagnosis not present

## 2012-01-07 DIAGNOSIS — N39498 Other specified urinary incontinence: Secondary | ICD-10-CM | POA: Diagnosis not present

## 2012-01-14 DIAGNOSIS — N39498 Other specified urinary incontinence: Secondary | ICD-10-CM | POA: Diagnosis not present

## 2012-02-02 DIAGNOSIS — M549 Dorsalgia, unspecified: Secondary | ICD-10-CM | POA: Diagnosis not present

## 2012-02-02 DIAGNOSIS — R42 Dizziness and giddiness: Secondary | ICD-10-CM | POA: Diagnosis not present

## 2012-02-11 DIAGNOSIS — N39498 Other specified urinary incontinence: Secondary | ICD-10-CM | POA: Diagnosis not present

## 2012-03-07 DIAGNOSIS — M1990 Unspecified osteoarthritis, unspecified site: Secondary | ICD-10-CM | POA: Diagnosis not present

## 2012-03-07 DIAGNOSIS — E669 Obesity, unspecified: Secondary | ICD-10-CM | POA: Diagnosis not present

## 2012-03-07 DIAGNOSIS — J410 Simple chronic bronchitis: Secondary | ICD-10-CM | POA: Diagnosis not present

## 2012-03-07 DIAGNOSIS — I10 Essential (primary) hypertension: Secondary | ICD-10-CM | POA: Diagnosis not present

## 2012-03-08 DIAGNOSIS — N39498 Other specified urinary incontinence: Secondary | ICD-10-CM | POA: Diagnosis not present

## 2012-05-05 DIAGNOSIS — N39498 Other specified urinary incontinence: Secondary | ICD-10-CM | POA: Diagnosis not present

## 2012-05-09 DIAGNOSIS — N61 Mastitis without abscess: Secondary | ICD-10-CM | POA: Diagnosis not present

## 2012-05-12 DIAGNOSIS — N61 Mastitis without abscess: Secondary | ICD-10-CM | POA: Diagnosis not present

## 2012-07-01 DIAGNOSIS — N39498 Other specified urinary incontinence: Secondary | ICD-10-CM | POA: Diagnosis not present

## 2012-07-26 ENCOUNTER — Other Ambulatory Visit (HOSPITAL_COMMUNITY): Payer: Self-pay | Admitting: Internal Medicine

## 2012-07-26 DIAGNOSIS — Z139 Encounter for screening, unspecified: Secondary | ICD-10-CM

## 2012-07-29 ENCOUNTER — Ambulatory Visit (HOSPITAL_COMMUNITY)
Admission: RE | Admit: 2012-07-29 | Discharge: 2012-07-29 | Disposition: A | Discharge status: Home / Self Care | Payer: Medicare Other | Source: Ambulatory Visit | Attending: Internal Medicine | Admitting: Internal Medicine

## 2012-07-29 DIAGNOSIS — Z1231 Encounter for screening mammogram for malignant neoplasm of breast: Secondary | ICD-10-CM | POA: Diagnosis not present

## 2012-07-29 DIAGNOSIS — Z139 Encounter for screening, unspecified: Secondary | ICD-10-CM

## 2012-08-30 DIAGNOSIS — I6789 Other cerebrovascular disease: Secondary | ICD-10-CM | POA: Diagnosis not present

## 2012-08-30 DIAGNOSIS — E119 Type 2 diabetes mellitus without complications: Secondary | ICD-10-CM | POA: Diagnosis not present

## 2012-08-30 DIAGNOSIS — M1990 Unspecified osteoarthritis, unspecified site: Secondary | ICD-10-CM | POA: Diagnosis not present

## 2012-08-30 DIAGNOSIS — I10 Essential (primary) hypertension: Secondary | ICD-10-CM | POA: Diagnosis not present

## 2012-08-31 DIAGNOSIS — N39498 Other specified urinary incontinence: Secondary | ICD-10-CM | POA: Diagnosis not present

## 2012-09-21 ENCOUNTER — Encounter: Payer: Self-pay | Admitting: Orthopedic Surgery

## 2012-09-21 ENCOUNTER — Ambulatory Visit (INDEPENDENT_AMBULATORY_CARE_PROVIDER_SITE_OTHER): Payer: Medicare Other | Admitting: Orthopedic Surgery

## 2012-09-21 ENCOUNTER — Ambulatory Visit (INDEPENDENT_AMBULATORY_CARE_PROVIDER_SITE_OTHER): Payer: Medicare Other

## 2012-09-21 VITALS — BP 102/64 | Ht 63.0 in | Wt 217.0 lb

## 2012-09-21 DIAGNOSIS — M7542 Impingement syndrome of left shoulder: Secondary | ICD-10-CM | POA: Insufficient documentation

## 2012-09-21 DIAGNOSIS — M25512 Pain in left shoulder: Secondary | ICD-10-CM

## 2012-09-21 DIAGNOSIS — M12812 Other specific arthropathies, not elsewhere classified, left shoulder: Secondary | ICD-10-CM

## 2012-09-21 DIAGNOSIS — M25519 Pain in unspecified shoulder: Secondary | ICD-10-CM

## 2012-09-21 DIAGNOSIS — M75100 Unspecified rotator cuff tear or rupture of unspecified shoulder, not specified as traumatic: Secondary | ICD-10-CM | POA: Insufficient documentation

## 2012-09-21 DIAGNOSIS — M12819 Other specific arthropathies, not elsewhere classified, unspecified shoulder: Secondary | ICD-10-CM | POA: Insufficient documentation

## 2012-09-21 DIAGNOSIS — M19019 Primary osteoarthritis, unspecified shoulder: Secondary | ICD-10-CM

## 2012-09-21 NOTE — Progress Notes (Signed)
Patient ID: Brittney Li, female   DOB: 1929/10/07, 79 y.o.   MRN: [id number] Chief Complaint  Patient presents with  . Shoulder Pain    Left shoulder pain mostly at night when lying down. Referred by Dr. Felecia Shelling    79 year old female with long history of shoulder pain presents with gradual onset of increasing left shoulder pain primarily at night when lying on her left side. She denies any trauma. Her symptoms include sharp throbbing stabbing 9/10 intermittent pain with no improvement from any interventions although no interventions are mentioned. She denies numbness tingling locking catching  Review of systems fatigue wheezing joint pain cold intolerance all other systems were reviewed and were negative   She is allergic to penicillin and erythromycin she is asthmatic she denies any surgery  Family physician Dr. Felecia Shelling  Pharmacy [facility]  Family history arthritis asthma diabetes  Social history divorced  Denies smoking drinking or street drugs.  Medications are reviewed and section of medication.  General appearance is normal, the patient is alert and oriented x3 with normal mood and affect. BP 102/64  Ht 5\' 3"  (1.6 m)  Wt 217 lb (98.431 kg)  BMI 38.45 kg/m2 Ambulation is compromised but this is chronic, gait is slow stride length is diminished cadence is diminished  Inspection of the right shoulder reveals no tenderness malalignment crepitation defects or mass  Range of motion reveals normal flexion in the scapular plane normal external rotation with the arm at the side and normal internal rotation.  The right shoulder is stable in abduction external rotation and the sulcus sign is normal. The posterior stress test is also normal and there is no laxity.  Strength and muscle tone are normal without spasticity atrophy or abnormal motion. Her  supraspinatus strength is 5 over 5 external rotator strength is 5 over 5 in internal rotation strength is 5 over 5 with a normal  liftoff test and a normal belly press test  The impingement test of NEER is neg  Skin over the right shoulder reveals note scar rash or caf au lait spots and no ulcer  She does have tenderness in the posterior subacromial space and decreased external rotation The left shoulder is stable in abduction external rotation and the sulcus sign is normal. The posterior stress test is also normal and there is no laxity.  Strength and muscle tone are normal without spasticity atrophy or abnormal motion. The  supraspinatus strength is 4 over 5 external rotator strength is 5 over 5 internal rotation strength is 5 over 5 with a normal liftoff test and a normal belly press test  The impingement test of NEER is positive  Skin over the right shoulder reveals note scar rash or caf au lait spots and no ulcer  Deep tendon reflexes are 2+ and sensation is normal    Ordered x-ray and read I've read the x-ray as worsening glenohumeral arthritis  She also has impingement syndrome Pain in joint, shoulder region, left - Plan: DG Shoulder Left  Rotator cuff tear arthropathy, left  Impingement syndrome of left shoulder  Arthritis of shoulder region, degenerative, left  Recommend injection subacromial space and home exercise program she did not want further therapy she says it usually makes her worse  Home exercises followup as needed

## 2012-09-26 ENCOUNTER — Ambulatory Visit: Payer: Medicare Other | Admitting: Orthopedic Surgery

## 2012-09-30 DIAGNOSIS — N39498 Other specified urinary incontinence: Secondary | ICD-10-CM | POA: Diagnosis not present

## 2012-11-04 DIAGNOSIS — N39498 Other specified urinary incontinence: Secondary | ICD-10-CM | POA: Diagnosis not present

## 2012-12-15 DIAGNOSIS — E119 Type 2 diabetes mellitus without complications: Secondary | ICD-10-CM | POA: Diagnosis not present

## 2012-12-15 DIAGNOSIS — I10 Essential (primary) hypertension: Secondary | ICD-10-CM | POA: Diagnosis not present

## 2012-12-15 DIAGNOSIS — M1990 Unspecified osteoarthritis, unspecified site: Secondary | ICD-10-CM | POA: Diagnosis not present

## 2012-12-23 DIAGNOSIS — N39498 Other specified urinary incontinence: Secondary | ICD-10-CM | POA: Diagnosis not present

## 2013-01-31 ENCOUNTER — Inpatient Hospital Stay (HOSPITAL_COMMUNITY)
Admission: EM | Admit: 2013-01-31 | Discharge: 2013-02-06 | DRG: 293 | Disposition: A | Discharge status: Home Health | Payer: Medicare Other | Attending: Internal Medicine | Admitting: Internal Medicine

## 2013-01-31 ENCOUNTER — Encounter (HOSPITAL_COMMUNITY): Payer: Self-pay | Admitting: *Deleted

## 2013-01-31 ENCOUNTER — Emergency Department (HOSPITAL_COMMUNITY): Payer: Medicare Other

## 2013-01-31 DIAGNOSIS — R0602 Shortness of breath: Secondary | ICD-10-CM | POA: Diagnosis not present

## 2013-01-31 DIAGNOSIS — Z8673 Personal history of transient ischemic attack (TIA), and cerebral infarction without residual deficits: Secondary | ICD-10-CM | POA: Diagnosis not present

## 2013-01-31 DIAGNOSIS — J4489 Other specified chronic obstructive pulmonary disease: Secondary | ICD-10-CM | POA: Diagnosis present

## 2013-01-31 DIAGNOSIS — Z825 Family history of asthma and other chronic lower respiratory diseases: Secondary | ICD-10-CM

## 2013-01-31 DIAGNOSIS — I10 Essential (primary) hypertension: Secondary | ICD-10-CM | POA: Diagnosis not present

## 2013-01-31 DIAGNOSIS — F0280 Dementia in other diseases classified elsewhere without behavioral disturbance: Secondary | ICD-10-CM | POA: Diagnosis not present

## 2013-01-31 DIAGNOSIS — Z8261 Family history of arthritis: Secondary | ICD-10-CM | POA: Diagnosis not present

## 2013-01-31 DIAGNOSIS — J449 Chronic obstructive pulmonary disease, unspecified: Secondary | ICD-10-CM | POA: Diagnosis present

## 2013-01-31 DIAGNOSIS — I5033 Acute on chronic diastolic (congestive) heart failure: Secondary | ICD-10-CM | POA: Diagnosis not present

## 2013-01-31 DIAGNOSIS — J811 Chronic pulmonary edema: Secondary | ICD-10-CM | POA: Diagnosis not present

## 2013-01-31 DIAGNOSIS — F0390 Unspecified dementia without behavioral disturbance: Secondary | ICD-10-CM | POA: Diagnosis present

## 2013-01-31 DIAGNOSIS — I129 Hypertensive chronic kidney disease with stage 1 through stage 4 chronic kidney disease, or unspecified chronic kidney disease: Secondary | ICD-10-CM | POA: Diagnosis present

## 2013-01-31 DIAGNOSIS — N182 Chronic kidney disease, stage 2 (mild): Secondary | ICD-10-CM | POA: Diagnosis present

## 2013-01-31 DIAGNOSIS — E785 Hyperlipidemia, unspecified: Secondary | ICD-10-CM | POA: Diagnosis present

## 2013-01-31 DIAGNOSIS — Z833 Family history of diabetes mellitus: Secondary | ICD-10-CM

## 2013-01-31 DIAGNOSIS — I509 Heart failure, unspecified: Secondary | ICD-10-CM | POA: Diagnosis present

## 2013-01-31 DIAGNOSIS — J90 Pleural effusion, not elsewhere classified: Secondary | ICD-10-CM | POA: Diagnosis not present

## 2013-01-31 DIAGNOSIS — I517 Cardiomegaly: Secondary | ICD-10-CM | POA: Diagnosis not present

## 2013-01-31 LAB — BASIC METABOLIC PANEL
CO2: 30 mEq/L (ref 19–32)
Calcium: 9.3 mg/dL (ref 8.4–10.5)
Chloride: 97 mEq/L (ref 96–112)
Creatinine, Ser: 1.44 mg/dL — ABNORMAL HIGH (ref 0.50–1.10)
GFR calc Af Amer: 38 mL/min — ABNORMAL LOW (ref >90)
Sodium: 136 mEq/L (ref 135–145)

## 2013-01-31 LAB — CBC WITH DIFFERENTIAL/PLATELET
Basophils Absolute: 0 10*3/uL (ref 0.0–0.1)
Basophils Relative: 0 % (ref 0–1)
Eosinophils Relative: 2 % (ref 0–5)
Lymphocytes Relative: 21 % (ref 12–46)
MCHC: 34.5 g/dL (ref 30.0–36.0)
Neutro Abs: 5.1 10*3/uL (ref 1.7–7.7)
Platelets: 178 10*3/uL (ref 150–400)
RDW: 15.5 % (ref 11.5–15.5)
WBC: 7.6 10*3/uL (ref 4.0–10.5)

## 2013-01-31 LAB — URINE MICROSCOPIC-ADD ON

## 2013-01-31 LAB — URINALYSIS, ROUTINE W REFLEX MICROSCOPIC
Glucose, UA: NEGATIVE mg/dL
Ketones, ur: NEGATIVE mg/dL
Leukocytes, UA: NEGATIVE
Nitrite: NEGATIVE
Protein, ur: NEGATIVE mg/dL
pH: 5 (ref 5.0–8.0)

## 2013-01-31 LAB — TROPONIN I
Troponin I: <0.3 ng/mL (ref <0.30)
Troponin I: <0.3 ng/mL (ref <0.30)

## 2013-01-31 LAB — PRO B NATRIURETIC PEPTIDE: Pro B Natriuretic peptide (BNP): 4720 pg/mL — ABNORMAL HIGH (ref 0–450)

## 2013-01-31 MED ORDER — BENAZEPRIL HCL 10 MG PO TABS
20.0000 mg | ORAL_TABLET | Freq: Every day | ORAL | Status: DC
  Administered 2013-02-01 – 2013-02-02 (×2): 20 mg via ORAL
  Filled 2013-01-31 (×2): qty 2

## 2013-01-31 MED ORDER — ENOXAPARIN SODIUM 40 MG/0.4ML ~~LOC~~ SOLN
40.0000 mg | SUBCUTANEOUS | Status: DC
  Administered 2013-01-31 – 2013-02-05 (×6): 40 mg via SUBCUTANEOUS
  Filled 2013-01-31 (×6): qty 0.4

## 2013-01-31 MED ORDER — AMLODIPINE BESY-BENAZEPRIL HCL 10-20 MG PO CAPS
1.0000 | ORAL_CAPSULE | Freq: Every day | ORAL | Status: DC
Start: 2013-01-31 — End: 2013-01-31

## 2013-01-31 MED ORDER — ASPIRIN 81 MG PO CHEW
81.0000 mg | CHEWABLE_TABLET | Freq: Every day | ORAL | Status: DC
  Administered 2013-02-01 – 2013-02-06 (×6): 81 mg via ORAL
  Filled 2013-01-31 (×6): qty 1

## 2013-01-31 MED ORDER — MIRTAZAPINE 30 MG PO TABS
30.0000 mg | ORAL_TABLET | Freq: Every day | ORAL | Status: DC
  Administered 2013-01-31 – 2013-02-05 (×6): 30 mg via ORAL
  Filled 2013-01-31 (×6): qty 1

## 2013-01-31 MED ORDER — ALBUTEROL SULFATE HFA 108 (90 BASE) MCG/ACT IN AERS
2.0000 | INHALATION_SPRAY | Freq: Four times a day (QID) | RESPIRATORY_TRACT | Status: DC | PRN
  Administered 2013-01-31 – 2013-02-01 (×2): 2 via RESPIRATORY_TRACT
  Filled 2013-01-31: qty 6.7

## 2013-01-31 MED ORDER — DONEPEZIL HCL 5 MG PO TABS
10.0000 mg | ORAL_TABLET | Freq: Every day | ORAL | Status: DC
  Administered 2013-01-31 – 2013-02-05 (×6): 10 mg via ORAL
  Filled 2013-01-31 (×2): qty 2
  Filled 2013-01-31: qty 1
  Filled 2013-01-31 (×3): qty 2
  Filled 2013-01-31: qty 1

## 2013-01-31 MED ORDER — FAMOTIDINE 20 MG PO TABS
20.0000 mg | ORAL_TABLET | Freq: Every day | ORAL | Status: DC
  Administered 2013-02-01 – 2013-02-06 (×6): 20 mg via ORAL
  Filled 2013-01-31 (×6): qty 1

## 2013-01-31 MED ORDER — POTASSIUM CHLORIDE CRYS ER 20 MEQ PO TBCR
40.0000 meq | EXTENDED_RELEASE_TABLET | Freq: Two times a day (BID) | ORAL | Status: DC
  Administered 2013-01-31 – 2013-02-06 (×12): 40 meq via ORAL
  Filled 2013-01-31 (×8): qty 2
  Filled 2013-01-31: qty 1
  Filled 2013-01-31 (×4): qty 2

## 2013-01-31 MED ORDER — HYDROCHLOROTHIAZIDE 12.5 MG PO CAPS: 12.5000 mg | ORAL_CAPSULE | Freq: Every day | ORAL | Status: DC

## 2013-01-31 MED ORDER — ADULT MULTIVITAMIN W/MINERALS CH
1.0000 | ORAL_TABLET | Freq: Every day | ORAL | Status: DC
  Administered 2013-02-01 – 2013-02-06 (×6): 1 via ORAL
  Filled 2013-01-31 (×6): qty 1

## 2013-01-31 MED ORDER — AMLODIPINE BESYLATE 5 MG PO TABS
10.0000 mg | ORAL_TABLET | Freq: Every day | ORAL | Status: DC
  Administered 2013-02-01 – 2013-02-06 (×6): 10 mg via ORAL
  Filled 2013-01-31 (×6): qty 2

## 2013-01-31 MED ORDER — TRAMADOL HCL 50 MG PO TABS: 50.0000 mg | ORAL_TABLET | Freq: Two times a day (BID) | ORAL | Status: DC | PRN

## 2013-01-31 MED ORDER — FUROSEMIDE 10 MG/ML IJ SOLN
40.0000 mg | Freq: Two times a day (BID) | INTRAMUSCULAR | Status: DC
  Administered 2013-01-31 – 2013-02-06 (×12): 40 mg via INTRAVENOUS
  Filled 2013-01-31 (×12): qty 4

## 2013-01-31 MED ORDER — SIMVASTATIN 20 MG PO TABS
40.0000 mg | ORAL_TABLET | Freq: Every day | ORAL | Status: DC
  Administered 2013-01-31: 40 mg via ORAL
  Filled 2013-01-31: qty 2

## 2013-01-31 MED ORDER — DOCUSATE SODIUM 100 MG PO CAPS
200.0000 mg | ORAL_CAPSULE | Freq: Every day | ORAL | Status: DC
  Administered 2013-01-31 – 2013-02-05 (×6): 200 mg via ORAL
  Filled 2013-01-31: qty 1
  Filled 2013-01-31 (×3): qty 2
  Filled 2013-01-31: qty 1
  Filled 2013-01-31: qty 2
  Filled 2013-01-31: qty 1
  Filled 2013-01-31: qty 2

## 2013-01-31 MED ORDER — FUROSEMIDE 10 MG/ML IJ SOLN
40.0000 mg | Freq: Once | INTRAMUSCULAR | Status: AC
  Administered 2013-01-31: 40 mg via INTRAVENOUS
  Filled 2013-01-31: qty 4

## 2013-01-31 MED ORDER — OXYBUTYNIN CHLORIDE 5 MG PO TABS
5.0000 mg | ORAL_TABLET | Freq: Every day | ORAL | Status: DC
  Administered 2013-02-01 – 2013-02-06 (×6): 5 mg via ORAL
  Filled 2013-01-31 (×6): qty 1

## 2013-01-31 NOTE — ED Notes (Signed)
Increased sob x 3 days.  Sent here by Dr. Felecia Shelling.  Pulse ox on RA upon arrival 60%.  Pt now 89% on 4L.  Denies pain.

## 2013-01-31 NOTE — H&P (Signed)
Brittney Li MRN: [id number] DOB/AGE: 08/30/1929 79 y.o. Primary Care Physician:Brittney Radebaugh, MD Admit date: 01/31/2013 Chief Complaint:  Shortness of breath and leg edema HPI:  This is an 79 years old female patient with history of multiple medical illness was seen in Er due to SOB and bilateral leg edema. Patient claims over the last 1 month she developed worsening shortness of breath. This was associated with bilateral leg edema. She has no chest pain. She has been taking her medications regularly including her inhalers. Patient was evaluated in the office and was found to be hypoxic. Patient was referred to ER. Her chest x-ray showed pulmonary  Congestion. Her BNP was also elevated.  No fever, chills, cough, chest pain, nausea or vomiting. No dysuria, urgency or frequency of urination.  Past Medical History  Diagnosis Date  . Asthma   . Bronchitis   hypertension dementia Past Surgical History  Procedure Laterality Date  . Hands          Family History  Problem Relation Age of Onset  . Arthritis    . Asthma    . Diabetes      Social History:  reports that she has never smoked. She does not have any smokeless tobacco history on file. She reports that she does not drink alcohol or use illicit drugs.   Allergies:  Allergies  Allergen Reactions  . Erythromycin Other (See Comments)    Unknown   . Penicillins Other (See Comments)    Unknown     Medications Prior to Admission  Medication Sig Dispense Refill  . albuterol (PROVENTIL HFA;VENTOLIN HFA) 108 (90 BASE) MCG/ACT inhaler Inhale 2 puffs into the lungs every 6 (six) hours as needed for wheezing.      Marland Kitchen albuterol (PROVENTIL) (2.5 MG/3ML) 0.083% nebulizer solution Take 2.5 mg by nebulization every 6 (six) hours as needed for wheezing.      Marland Kitchen amLODipine-benazepril (LOTREL) 10-20 MG per capsule Take 1 capsule by mouth daily.      Marland Kitchen aspirin 81 MG tablet Take 81 mg by mouth daily.        Marland Kitchen docusate sodium (COLACE) 100 MG  capsule Take 200 mg by mouth at bedtime.      . donepezil (ARICEPT) 10 MG tablet Take 10 mg by mouth at bedtime.      . famotidine (PEPCID) 20 MG tablet Take 20 mg by mouth daily.      . hydrochlorothiazide (MICROZIDE) 12.5 MG capsule Take 12.5 mg by mouth daily.      . mirtazapine (REMERON) 30 MG tablet Take 30 mg by mouth at bedtime.      . Multiple Vitamin (MULTIVITAMIN WITH MINERALS) TABS tablet Take 1 tablet by mouth daily.      Marland Kitchen oxybutynin (DITROPAN) 5 MG tablet Take 5 mg by mouth daily.      . pravastatin (PRAVACHOL) 80 MG tablet Take 80 mg by mouth daily.      . traMADol (ULTRAM) 50 MG tablet Take 50 mg by mouth 3 (three) times daily as needed for pain (Takes one tablet every morning and evening.).           NWG:NFAOZ from the symptoms mentioned above,there are no other symptoms referable to all systems reviewed.  Physical Exam: Blood pressure 145/85, pulse 72, temperature 97.9 F (36.6 C), temperature source Oral, resp. rate 23, height 5\' 5"  (1.651 m), weight 90.719 kg (200 lb), SpO2 92.00%. General Condition: chronically sik looking,  HE ENT- Pupils equal and reactive, neck supple  Respiratory- decreased air entry, bilateral rales at the base CVS- S1 and S2 heard, no murmur or gallop ABD- soft and lax, bowel sound is + EXT-++ leg edema    Recent Labs  01/31/13 1230  WBC 7.6  NEUTROABS 5.1  HGB 13.2  HCT 38.3  MCV 80.1  PLT 178    Recent Labs  01/31/13 1230  NA 136  K 4.4  CL 97  CO2 30  GLUCOSE 122*  BUN 39*  CREATININE 1.44*  CALCIUM 9.3  lablast2(ast:2,ALT:2,alkphos:2,bilitot:2,prot:2,albumin:2)@    No results found for this or any previous visit (from the past 240 hour(s)).   Dg Chest Portable 1 View  01/31/2013   CLINICAL DATA:  79 year old female shortness of Breath.  EXAM: PORTABLE CHEST - 1 VIEW  COMPARISON:  09/19/2010 and earlier.  FINDINGS: Portable upright AP view at 1233 hrs. Lower lung volumes. Increased interstitial opacity most  confluent at the lung bases. Trace fluid at the right minor fissure. Evidence of small pleural effusions. No pneumothorax or consolidation. Stable cardiac size and mediastinal contours.  IMPRESSION: Lower lung volumes with small pleural effusions and increased interstitial opacity favored to represent acute pulmonary edema. Vital/atypical respiratory infection is felt less likely.   Electronically Signed   By: Augusto Gamble   On: 01/31/2013 12:46   Impression: 1. Shortness of breath with pulmonary congestion probably secondary to Acute CHF 2. Hypertension 3.H/O of Asthma 4.Dementia 5.Renal failure  Active Problems:   * No active hospital problems. *     Plan: Admit under telemetry Will do serial EKG and cardiac enzyme IV diureses Echo Cardiology consult Will monitor BMP      Brittney Li  01/31/2013, 6:53 PM

## 2013-01-31 NOTE — ED Provider Notes (Addendum)
CSN: [id number]     Arrival date & time 01/31/13  1205 History   This chart was scribed for Donnetta Hutching, MD, by Yevette Edwards, ED Scribe. This patient was seen in room APA06/APA06 and the patient's care was started at 1:12 PM. First MD Initiated Contact with Patient 01/31/13 1308     Chief Complaint  Patient presents with  . Shortness of Breath   The history is provided by the patient and a caregiver. No language interpreter was used.   HPI Comments: Brittney Li is a 79 y.o. female, with a h/o asthma, who presents to the Emergency Department complaining of gradual-onset, gradually-increasing SOB which has been occurring for the past four days. She uses an at-home nebulizer, but recently the nebulizer has not provided the normal resolution. The pt has experienced bilateral pedal edema, gradually-increasing dizziness, and vision impairment for four days. She denies experiencing any pain. She had a stroke in 1989.    Dr. Felecia Shelling is the pt's PCP.  Past Medical History  Diagnosis Date  . Asthma   . Bronchitis    Past Surgical History  Procedure Laterality Date  . Hands     Family History  Problem Relation Age of Onset  . Arthritis    . Asthma    . Diabetes     History  Substance Use Topics  . Smoking status: Never Smoker   . Smokeless tobacco: Not on file  . Alcohol Use: No   No OB history provided.  Review of Systems  Constitutional: Negative for fever.  Eyes: Positive for visual disturbance.  Respiratory: Positive for shortness of breath.   Cardiovascular: Positive for leg swelling.  Gastrointestinal: Negative for abdominal pain.  Musculoskeletal: Negative for back pain.  Neurological: Positive for dizziness.  All other systems reviewed and are negative.    Allergies  Erythromycin and Penicillins  Home Medications   Current Outpatient Rx  Name  Route  Sig  Dispense  Refill  . aspirin 81 MG tablet   Oral   Take 81 mg by mouth daily.           .  hydrochlorothiazide (MICROZIDE) 12.5 MG capsule   Oral   Take 12.5 mg by mouth daily.          Triage Vitals: BP 136/62  Temp(Src) 98.1 F (36.7 C) (Oral)  Resp 22  Ht 5\' 5"  (1.651 m)  Wt 217 lb (98.431 kg)  BMI 36.11 kg/m2  SpO2 93%  Physical Exam  Nursing note and vitals reviewed. Constitutional: She is oriented to person, place, and time. She appears well-developed and well-nourished. No distress.  HENT:  Head: Normocephalic and atraumatic.  Eyes: EOM are normal.  Neck: Neck supple. No tracheal deviation present.  Cardiovascular: Normal rate.   Pulmonary/Chest: Effort normal. No respiratory distress.  Musculoskeletal: Normal range of motion. She exhibits edema.  2 + peripheral edema.  Neurological: She is alert and oriented to person, place, and time.  Grossly intact.   Skin: Skin is warm and dry.  Psychiatric: She has a normal mood and affect. Her behavior is normal.    ED Course  Procedures (including critical care time)  DIAGNOSTIC STUDIES: Oxygen Saturation is 93% on [place], low by my interpretation.    COORDINATION OF CARE:  1:19 PM- Discussed treatment plan with patient which includes posssible admission , and the patient agreed to the plan.   Labs Review Labs Reviewed  BASIC METABOLIC PANEL - Abnormal; Notable for the following:  Glucose, Bld 122 (*)    BUN 39 (*)    Creatinine, Ser 1.44 (*)    GFR calc non Af Amer 33 (*)    GFR calc Af Amer 38 (*)    All other components within normal limits  PRO B NATRIURETIC PEPTIDE - Abnormal; Notable for the following:    Pro B Natriuretic peptide (BNP) 4720.0 (*)    All other components within normal limits  CBC WITH DIFFERENTIAL  TROPONIN I  URINALYSIS, ROUTINE W REFLEX MICROSCOPIC   Imaging Review Dg Chest Portable 1 View  01/31/2013   CLINICAL DATA:  79 year old female shortness of Breath.  EXAM: PORTABLE CHEST - 1 VIEW  COMPARISON:  09/19/2010 and earlier.  FINDINGS: Portable upright AP view at 1233 hrs.  Lower lung volumes. Increased interstitial opacity most confluent at the lung bases. Trace fluid at the right minor fissure. Evidence of small pleural effusions. No pneumothorax or consolidation. Stable cardiac size and mediastinal contours.  IMPRESSION: Lower lung volumes with small pleural effusions and increased interstitial opacity favored to represent acute pulmonary edema. Vital/atypical respiratory infection is felt less likely.   Electronically Signed   By: Augusto Gamble   On: 01/31/2013 12:46   Date: 01/31/2013  Rate: 68  Rhythm: normal sinus rhythm  QRS Axis: normal  Intervals: normal  ST/T Wave abnormalities: normal  Conduction Disutrbances: RBBB  Narrative Interpretation: unremarkable     MDM  No diagnosis found. History and physical consistent with pulmonary edema. IV Lasix given c good response.  Admit  I personally performed the services described in this documentation, which was scribed in my presence. The recorded information has been reviewed and is accurate.    Donnetta Hutching, MD 01/31/13 1542  Donnetta Hutching, MD 02/01/13 [phone]

## 2013-01-31 NOTE — ED Notes (Signed)
C/o shortness of breath x 2 days. Oxygen saturations in the 60s per MD office/patient. Reports using nebulizer at home without change/relief. Patient on 4 liters [place] at present with oxygen saturations 91-93%. No distress. Patient also c/o bilateral leg swelling.

## 2013-01-31 NOTE — ED Notes (Signed)
Pt was placed on monitor as well and v/s obtained when pt was placed into room on arrival. Brittney Li

## 2013-01-31 NOTE — ED Notes (Signed)
Patient assisted to [facility] by Nurse Tech. Tech reports patient experienced increased shortness of breath and oxygen saturations decreased to 70 %. Assisted back to bed, oxygen saturations increased back to 91%. MD aware and verbal order for foley catheter obtained.

## 2013-02-01 DIAGNOSIS — I10 Essential (primary) hypertension: Secondary | ICD-10-CM | POA: Diagnosis not present

## 2013-02-01 DIAGNOSIS — I509 Heart failure, unspecified: Secondary | ICD-10-CM | POA: Diagnosis not present

## 2013-02-01 DIAGNOSIS — I517 Cardiomegaly: Secondary | ICD-10-CM | POA: Diagnosis not present

## 2013-02-01 DIAGNOSIS — F0280 Dementia in other diseases classified elsewhere without behavioral disturbance: Secondary | ICD-10-CM | POA: Diagnosis not present

## 2013-02-01 DIAGNOSIS — J449 Chronic obstructive pulmonary disease, unspecified: Secondary | ICD-10-CM | POA: Diagnosis not present

## 2013-02-01 LAB — BASIC METABOLIC PANEL
BUN: 38 mg/dL — ABNORMAL HIGH (ref 6–23)
CO2: 33 mEq/L — ABNORMAL HIGH (ref 19–32)
Chloride: 101 mEq/L (ref 96–112)
GFR calc Af Amer: 45 mL/min — ABNORMAL LOW (ref >90)
GFR calc non Af Amer: 38 mL/min — ABNORMAL LOW (ref >90)

## 2013-02-01 MED ORDER — ATORVASTATIN CALCIUM 20 MG PO TABS
20.0000 mg | ORAL_TABLET | Freq: Every day | ORAL | Status: DC
  Administered 2013-02-01 – 2013-02-05 (×5): 20 mg via ORAL
  Filled 2013-02-01 (×5): qty 1

## 2013-02-01 NOTE — Progress Notes (Signed)
Subjective: Patent was admitted yesterday due shortness of breath and leg edema. She is being diuresed. Patient feels better. No nausea or vomiting.  Objective: Vital signs in last 24 hours: Temp:  [97.9 F (36.6 C)-98.4 F (36.9 C)] 98.4 F (36.9 C) (09/03 0500) Pulse Rate:  [62-72] 66 (09/03 0500) Resp:  [20-23] 20 (09/03 0500) BP: (118-145)/(62-87) 121/69 mmHg (09/03 0500) SpO2:  [91 %-96 %] 96 % (09/03 0500) Weight:  [90.719 kg (200 lb)-98.431 kg (217 lb)] 90.719 kg (200 lb) (09/02 1726) Weight change:     Intake/Output from previous day: 09/02 0701 - 09/03 0700 In: -  Out: 200 [Urine:200]  PHYSICAL EXAM General appearance: alert and no distress Resp: dullness to percussion bibasilar and rales bibasilar Cardio: S1, S2 normal GI: soft, non-tender; bowel sounds normal; no masses,  no organomegaly Extremities: edema 2++  Lab Results:    @labtest @ [facility] No results found for this basename: PHART, PCO2, PO2ART, TCO2, HCO3,  in the last 72 hours CULTURES No results found for this or any previous visit (from the past 240 hour(s)). Studies/Results: Dg Chest Portable 1 View  01/31/2013   CLINICAL DATA:  79 year old female shortness of Breath.  EXAM: PORTABLE CHEST - 1 VIEW  COMPARISON:  09/19/2010 and earlier.  FINDINGS: Portable upright AP view at 1233 hrs. Lower lung volumes. Increased interstitial opacity most confluent at the lung bases. Trace fluid at the right minor fissure. Evidence of small pleural effusions. No pneumothorax or consolidation. Stable cardiac size and mediastinal contours.  IMPRESSION: Lower lung volumes with small pleural effusions and increased interstitial opacity favored to represent acute pulmonary edema. Vital/atypical respiratory infection is felt less likely.   Electronically Signed   By: Augusto Gamble   On: 01/31/2013 12:46    Medications: I have reviewed the patient's current medications.  Assesment: 1. Shortness of breath with pulmonary congestion  probably secondary to Acute CHF  2. Hypertension  3.H/O of Asthma  4.Dementia  5.Renal failure   Active Problems:   * No active hospital problems. *    Plan: Continue Iv diuretics Echo requested Cardiology consult Will monitor BMP    LOS: 1 day   Shakeema Lippman 02/01/2013, 7:36 AM

## 2013-02-01 NOTE — Progress Notes (Signed)
ANTICOAGULATION CONSULT NOTE - Initial Consult  Pharmacy Consult for Lovenox  Indication: VTE prophylaxis  Allergies  Allergen Reactions  . Erythromycin Other (See Comments)    Unknown   . Penicillins Other (See Comments)    Unknown     Patient Measurements: Height: 5\' 5"  (165.1 cm) Weight: 200 lb (90.719 kg) IBW/kg (Calculated) : 57  Vital Signs: Temp: 98.4 F (36.9 C) (09/03 0500) Temp src: Oral (09/03 0500) BP: 121/69 mmHg (09/03 0500) Pulse Rate: 66 (09/03 0500)  Labs:  Recent Labs  01/31/13 1230 01/31/13 1601 02/01/13 0542  HGB 13.2  --   --   HCT 38.3  --   --   PLT 178  --   --   CREATININE 1.44*  --  1.27*  TROPONINI <0.30 <0.30  --     Estimated Creatinine Clearance: 38.7 ml/min (by C-G formula based on Cr of 1.27).   Medical History: Past Medical History  Diagnosis Date  . Asthma   . Bronchitis     Medications:  Scheduled:  . amLODipine  10 mg Oral Daily   And  . benazepril  20 mg Oral Daily  . aspirin  81 mg Oral Daily  . docusate sodium  200 mg Oral QHS  . donepezil  10 mg Oral QHS  . enoxaparin (LOVENOX) injection  40 mg Subcutaneous Q24H  . famotidine  20 mg Oral Daily  . furosemide  40 mg Intravenous BID  . mirtazapine  30 mg Oral QHS  . multivitamin with minerals  1 tablet Oral Daily  . oxybutynin  5 mg Oral Daily  . potassium chloride  40 mEq Oral BID  . simvastatin  40 mg Oral q1800    Assessment: 79 yo F admitted with HF exacerbation.  Renal function has improved since admission and is >80ml/min. No bleeding noted.  CBC reviewed.  Lovenox for VTE px while hospitalized.  Goal of Therapy:  Monitor platelets by anticoagulation protocol: Yes   Plan:  Lovenox 40mg  sq q24h Pharmacy to sign off.  Re-consult as needed.  Elson Clan 02/01/2013,7:47 AM

## 2013-02-01 NOTE — Progress Notes (Signed)
UR Chart Review Completed  

## 2013-02-01 NOTE — Progress Notes (Signed)
*  PRELIMINARY RESULTS* Echocardiogram 2D Echocardiogram has been performed.  Conrad Collins 02/01/2013, 3:25 PM

## 2013-02-01 NOTE — Care Management Note (Signed)
    Page 1 of 2   02/06/2013     2:37:00 PM   CARE MANAGEMENT NOTE 02/06/2013  Patient:  Brittney Li, Brittney Li   Account Number:  [id number]  Date Initiated:  02/01/2013  Documentation initiated by:  Anibal Henderson  Subjective/Objective Assessment:   Pt admitted with dyspnea and edema,  CHF. She lives at home alone, but has Li CAP aide from [employer] in [place], who is with her here in the hosp. She also has family who live nearby and assist as needed     Action/Plan:   Pt is on O2 here, but not at home.  Will follow for possible O2 and therefore HH needs at D/C   Anticipated DC Date:  02/04/2013   Anticipated DC Plan:  HOME W HOME HEALTH SERVICES      DC Planning Services  CM consult      [facility] Choice  HOME HEALTH   Choice offered to / List presented to:  C-1 Patient   DME arranged  [facility]      DME agency  [facility]     [facility] arranged  HH-1 RN  HH-2 PT  HH-10 DISEASE MANAGEMENT      HH agency  [facility]   Status of service:  Completed, signed off Medicare Important Message given?  YES (If response is "NO", the following Medicare IM given date fields will be blank) Date Medicare IM given:  02/06/2013 Date Additional Medicare IM given:    Discharge Disposition:  HOME W HOME HEALTH SERVICES  Per UR Regulation:  Reviewed for med. necessity/level of care/duration of stay  If discussed at Long Length of Stay Meetings, dates discussed:    Comments:  02/06/13 Rosemary Holms RN BSN CM Pt discharging with [facility]. New order faxed to [facility] agency. O2 needed for DC. [facility] provided.  02/03/13 1130 Anibal Henderson RN Pt remembers that she has Li nurse who comes fro [facility] to fill her pill box every 2-4 weeks, and to supervise the aide. Notified [facility] of admission 02/01/13 1100 Anibal Henderson RN/CM

## 2013-02-02 DIAGNOSIS — I509 Heart failure, unspecified: Secondary | ICD-10-CM | POA: Diagnosis not present

## 2013-02-02 DIAGNOSIS — J449 Chronic obstructive pulmonary disease, unspecified: Secondary | ICD-10-CM | POA: Diagnosis not present

## 2013-02-02 DIAGNOSIS — I10 Essential (primary) hypertension: Secondary | ICD-10-CM | POA: Diagnosis not present

## 2013-02-02 DIAGNOSIS — I5033 Acute on chronic diastolic (congestive) heart failure: Principal | ICD-10-CM

## 2013-02-02 DIAGNOSIS — F0280 Dementia in other diseases classified elsewhere without behavioral disturbance: Secondary | ICD-10-CM | POA: Diagnosis not present

## 2013-02-02 LAB — BASIC METABOLIC PANEL
Chloride: 104 mEq/L (ref 96–112)
Creatinine, Ser: 1.18 mg/dL — ABNORMAL HIGH (ref 0.50–1.10)
GFR calc Af Amer: 49 mL/min — ABNORMAL LOW (ref >90)

## 2013-02-02 NOTE — Progress Notes (Signed)
While RT was giving PRN breathing tx pts sats were in 70s due to pt coughing on 3L [place]. RT put pt on venturi mask at 50%. Pt then refused at 2300 that she does not want the mask anymore cause she could not go to sleep. Pt was put back on the [place] at 4L and sats were rechecked at 2320, pts sats were 96-97%. Sats were rechecked again at 0030, sats were 97%. Continued to monitor through the night and pts maintained sats in upper 90's. O2 was lowered to 3L and still maintained sats in mid to upper 90s. Pt is safe and stable. Will continue to monitor.

## 2013-02-02 NOTE — Progress Notes (Signed)
Subjective: Patent feels slightly better. She diuresing well. Echo showed ef 60% and grade 2 diastolic dysfunction  Objective: Vital signs in last 24 hours: Temp:  [97.8 F (36.6 C)-99.1 F (37.3 C)] 97.8 F (36.6 C) (09/04 0440) Pulse Rate:  [63-71] 70 (09/04 0440) Resp:  [20-21] 20 (09/04 0440) BP: (116-129)/(50-76) 122/53 mmHg (09/04 0440) SpO2:  [91 %-99 %] 96 % (09/04 0454) FiO2 (%):  [40 %] 40 % (09/03 2148) Weight change:  Last BM Date: 01/30/13  Intake/Output from previous day: 09/03 0701 - 09/04 0700 In: 840 [P.O.:840] Out: 5110 [Urine:5110]  PHYSICAL EXAM General appearance: alert and no distress Resp: dullness to percussion bibasilar and rales bibasilar Cardio: S1, S2 normal GI: soft, non-tender; bowel sounds normal; no masses,  no organomegaly Extremities: edema 2++  Lab Results:    @labtest @ [facility] No results found for this basename: PHART, PCO2, PO2ART, TCO2, HCO3,  in the last 72 hours CULTURES No results found for this or any previous visit (from the past 240 hour(s)). Studies/Results: Dg Chest Portable 1 View  01/31/2013   CLINICAL DATA:  79 year old female shortness of Breath.  EXAM: PORTABLE CHEST - 1 VIEW  COMPARISON:  09/19/2010 and earlier.  FINDINGS: Portable upright AP view at 1233 hrs. Lower lung volumes. Increased interstitial opacity most confluent at the lung bases. Trace fluid at the right minor fissure. Evidence of small pleural effusions. No pneumothorax or consolidation. Stable cardiac size and mediastinal contours.  IMPRESSION: Lower lung volumes with small pleural effusions and increased interstitial opacity favored to represent acute pulmonary edema. Vital/atypical respiratory infection is felt less likely.   Electronically Signed   By: Augusto Gamble   On: 01/31/2013 12:46    Medications: I have reviewed the patient's current medications.  Assesment: 1. Acute CHF (Diastolic dysfunction) 2. Hypertension  3.H/O of Asthma  4.Dementia  5.Renal  failure   Active Problems:   * No active hospital problems. *    Plan: Continue Iv diuretics Cardiology consult pending Will monitor BMP    LOS: 2 days   Berlyn Malina 02/02/2013, 7:47 AM

## 2013-02-02 NOTE — Consult Note (Signed)
CARDIOLOGY CONSULT NOTE  Patient ID: Brittney Li MRN: [id number] DOB/AGE: [birth year] 79 y.o.  Admit date: 01/31/2013 Referring Physician: Felecia Shelling MD Primary Raina Mina, MD Consulting Cardiologist: Dr. Diona Browner Reason for Consultation: Diastolic CHF/Pulmonary Edema  HPI:  79 year old patient with no prior cardiac history but long-standing history of asthma and chronic bronchitis, hypertension, and hypelipidemia.  She has had greater than one month complaint of insidious lower extremity edema with worsening, breathing status with 3 pillow orthopnea for approximately 3 weeks. The patient felt was related to her asthma she did not seek medical attention. On day of admission the patient was seen by her primary care physician, was havin a good bit of trouble breathing with blurred vision and generalized weakness. She also been having some frequent productive coughing over the week prior to being seen.     The patient was seen by Dr. Felecia Shelling in his office and was found to have an O2 sat of 68% on room air. She was significantly congested in her lungs and was referred to ER for admission. In ER the patient's blood pressure is 136/62 with O2 sat of 93% on 6 L. Troponin was found to be negative. Creatinine 1.44, BUN 39, there was no evidence of anemia or leukocytosis. Pro-BNP 4720. Chest x-ray revealed pulmonary edema. She was given 40 mg of IV Lasix in ER and admitted for ongoing diuresis and treatment.       Echocardiogram was completed this admission demonstrated a normal ejection fraction of 60-65% with grade 2 diastolic dysfunction. No valvular abnormalities. Since admission the patient is diuresis greater than 4000 cc, was creatinine improving to 1.18. She remains on Lasix 40 mg IV twice a day.   Review of systems complete and found to be negative unless listed above   Past Medical History  Diagnosis Date  . Asthma   . Bronchitis      Family History  Problem Relation Age of Onset   . Arthritis    . Asthma    . Diabetes       History   Social History  . Marital Status: Divorced    Spouse Name: N/A    Number of Children: N/A  . Years of Education: 12th grade   Occupational History  . retired    Social History Main Topics  . Smoking status: Never Smoker   . Smokeless tobacco: Not on file  . Alcohol Use: No  . Drug Use: No  . Sexual Activity: Not on file   Other Topics Concern  . Not on file   Social History Narrative  . No narrative on file     Past Surgical History  Procedure Laterality Date  . Hands      Current medication: . amLODipine  10 mg Oral Daily   And  . benazepril  20 mg Oral Daily  . aspirin  81 mg Oral Daily  . atorvastatin  20 mg Oral q1800  . docusate sodium  200 mg Oral QHS  . donepezil  10 mg Oral QHS  . enoxaparin (LOVENOX) injection  40 mg Subcutaneous Q24H  . famotidine  20 mg Oral Daily  . furosemide  40 mg Intravenous BID  . mirtazapine  30 mg Oral QHS  . multivitamin with minerals  1 tablet Oral Daily  . oxybutynin  5 mg Oral Daily  . potassium chloride  40 mEq Oral BID    Echocardigram: Left ventricle: Wall thickness was increased in a pattern of mild LVH. Systolic function  was normal. The estimated ejection fraction was in the range of 60% to 65%. Features are consistent with a pseudonormal left ventricular filling pattern, with concomitant abnormal relaxation and increased filling pressure (grade 2 diastolic dysfunction). Doppler parameters are consistent with high ventricular filling pressure. - Ventricular septum: Septal motion consistent with an underlying conduction abnormality. - Left atrium: The atrium was mildly dilated.   Physical Exam: Blood pressure 122/53, pulse 70, temperature 97.8 F (36.6 C), temperature source Oral, resp. rate 18, height 5\' 5"  (1.651 m), weight 200 lb (90.719 kg), SpO2 96.00%.   General: No acute distress Head: Eyes PERRLA, No xanthomas.   Normal cephalic and  atramatic  Lungs:  Inspiratory and expiratory rales and wheezing, with frequent productive coughing. Heart: HRRR S1 S2, without MRG.  Pulses are 2+ & equal.            No carotid bruit. No JVD.   Abdomen: Bowel sounds are positive, abdomen soft and non-tender without masses or                  Hernia's noted. Msk:  Back normal,  Normal strength and tone for age. Extremities: No clubbing, cyanosis, non-pitting edema.  DP +1 Neuro: Alert and oriented X 3. Psych:  Good affect, responds appropriately  Labs:   Lab Results  Component Value Date   WBC 7.6 01/31/2013   HGB 13.2 01/31/2013   HCT 38.3 01/31/2013   MCV 80.1 01/31/2013   PLT 178 01/31/2013    Recent Labs Lab 02/02/13 0522  NA 144  K 4.8  CL 104  CO2 34*  BUN 38*  CREATININE 1.18*  CALCIUM 9.1  GLUCOSE 108*   Lab Results  Component Value Date   TROPONINI <0.30 01/31/2013     BNP (last 3 results)  Recent Labs  01/31/13 1230  PROBNP 4720.0*    Radiology: Dg Chest Portable 1 View  01/31/2013   CLINICAL DATA:  79 year old female shortness of Breath.  EXAM: PORTABLE CHEST - 1 VIEW  COMPARISON:  09/19/2010 and earlier.  FINDINGS: Portable upright AP view at 1233 hrs. Lower lung volumes. Increased interstitial opacity most confluent at the lung bases. Trace fluid at the right minor fissure. Evidence of small pleural effusions. No pneumothorax or consolidation. Stable cardiac size and mediastinal contours.  IMPRESSION: Lower lung volumes with small pleural effusions and increased interstitial opacity favored to represent acute pulmonary edema. Vital/atypical respiratory infection is felt less likely.   Electronically Signed   By: Augusto Gamble   On: 01/31/2013 12:46    EKG: NSR with RBBB rate of 68 bpm. No ischemia   ASSESSMENT AND PLAN:   1. Probable acute (on chronic) diastolic CHF: Patient has had worsening symptoms greater than one month in duration beginning with lower extremity edema and declining breathing status. Patient  did not seem medical attention until day of admission where she was seen by Dr. Felecia Shelling, O2 sat in his office was 68% on room air. Since admission she is diuresed greater than 4000 cc of urine on IV Lasix 40 mg twice a day. Uncertain of accuracy of weights or dry weight. linitiate strict I&O and daily weight for better evaluation of status. Daily BMET's.  2. Hypertension: Currently blood pressure is stable, she remains on amlodipine, benazepril,. She is on microzide at home. Creatinine 1.18 this am.  3. Hyperlipidemia: Currently on statin. As this is one of many CVRF;s this will need to be continued and followed with ongoing labs and low cholesterol  diet.   4. Chronic Asthma: Recommend pulmonary consultation. Continues on inhaled steroids.   Signed: Bettey Mare. Lyman Bishop NP [facility] 02/02/2013, 11:03 AM Co-Sign MD   Attending note:  Patient seen and examined. Modified above note by Ms. Lawrence NP. Brittney Li presents with progressive shortness of breath, cough, leg edema, and ultimately orthopnea. She has evidence of acute (on probably chronic) diastolic heart failure with volume overload, also exacerbation of chronic lung disease. She is beginning to feel better, still has wheezing on examination. Left leg edema. Significant diuresis of oral and IV Lasix. Her echocardiogram shows mild LVH with LVEF 60-65% and grade 2 diastolic dysfunction. Current medical regimen reviewed and reasonable. Would continue to work on diuresis, consider Pulmonary consultation to adjust pulmonary medical therapy further.  Jonelle Sidle, M.D., F.A.C.C.

## 2013-02-03 DIAGNOSIS — F0280 Dementia in other diseases classified elsewhere without behavioral disturbance: Secondary | ICD-10-CM | POA: Diagnosis not present

## 2013-02-03 DIAGNOSIS — I10 Essential (primary) hypertension: Secondary | ICD-10-CM | POA: Diagnosis not present

## 2013-02-03 DIAGNOSIS — J449 Chronic obstructive pulmonary disease, unspecified: Secondary | ICD-10-CM | POA: Diagnosis not present

## 2013-02-03 DIAGNOSIS — I509 Heart failure, unspecified: Secondary | ICD-10-CM | POA: Diagnosis not present

## 2013-02-03 DIAGNOSIS — I5033 Acute on chronic diastolic (congestive) heart failure: Secondary | ICD-10-CM | POA: Diagnosis not present

## 2013-02-03 DIAGNOSIS — J811 Chronic pulmonary edema: Secondary | ICD-10-CM

## 2013-02-03 LAB — BASIC METABOLIC PANEL
CO2: 37 mEq/L — ABNORMAL HIGH (ref 19–32)
Calcium: 9.7 mg/dL (ref 8.4–10.5)
GFR calc Af Amer: 53 mL/min — ABNORMAL LOW (ref >90)
GFR calc non Af Amer: 45 mL/min — ABNORMAL LOW (ref >90)
Sodium: 144 mEq/L (ref 135–145)

## 2013-02-03 MED ORDER — LOSARTAN POTASSIUM 50 MG PO TABS
100.0000 mg | ORAL_TABLET | Freq: Every day | ORAL | Status: DC
  Administered 2013-02-03 – 2013-02-06 (×4): 100 mg via ORAL
  Filled 2013-02-03 (×4): qty 2

## 2013-02-03 MED ORDER — MOMETASONE FURO-FORMOTEROL FUM 200-5 MCG/ACT IN AERO
2.0000 | INHALATION_SPRAY | Freq: Two times a day (BID) | RESPIRATORY_TRACT | Status: DC
  Administered 2013-02-03 – 2013-02-06 (×6): 2 via RESPIRATORY_TRACT
  Filled 2013-02-03: qty 8.8

## 2013-02-03 NOTE — Progress Notes (Signed)
Subjective: Patent feels slightly better. She is still short of breath.  Objective: Vital signs in last 24 hours: Temp:  [98 F (36.7 C)-98.8 F (37.1 C)] 98.6 F (37 C) (09/05 0525) Pulse Rate:  [63-70] 66 (09/05 0525) Resp:  [18] 18 (09/05 0525) BP: (119-129)/(60-76) 120/60 mmHg (09/05 0525) SpO2:  [93 %-96 %] 95 % (09/05 0525) Weight:  [100.971 kg (222 lb 9.6 oz)] 100.971 kg (222 lb 9.6 oz) (09/05 0404) Weight change:  Last BM Date: 02/02/13  Intake/Output from previous day: 09/04 0701 - 09/05 0700 In: 1080 [P.O.:1080] Out: 2500 [Urine:2500]  PHYSICAL EXAM General appearance: alert and no distress Resp: dullness to percussion bibasilar and rales bibasilar Cardio: S1, S2 normal GI: soft, non-tender; bowel sounds normal; no masses,  no organomegaly Extremities: edema 2++  Lab Results:    @labtest @ [facility] No results found for this basename: PHART, PCO2, PO2ART, TCO2, HCO3,  in the last 72 hours CULTURES No results found for this or any previous visit (from the past 240 hour(s)). Studies/Results: No results found.  Medications: I have reviewed the patient's current medications.  Assesment: 1. Shortness of breath with pulmonary congestion probably secondary to Acute CHF  2. Hypertension  3.H/O of Asthma  4.Dementia  5.Renal failure   Active Problems:   Acute on chronic diastolic heart failure    Plan: Continue Iv diuretics Cardiology consult appreciated Pulmonary consult Will monitor BMP    LOS: 3 days   Latorie Montesano 02/03/2013, 7:52 AM

## 2013-02-03 NOTE — Progress Notes (Addendum)
Subjective: No Cp  Breathing is not back to normal. Objective: Filed Vitals:   02/02/13 1449 02/02/13 2130 02/03/13 0404 02/03/13 0525  BP: 119/70 129/76  120/60  Pulse: 63 65  66  Temp: 98 F (36.7 C) 98.8 F (37.1 C)  98.6 F (37 C)  TempSrc:  Oral  Oral  Resp: 18 18  18   Height:      Weight:   222 lb 9.6 oz (100.971 kg)   SpO2: 93% 94%  95%   Weight change:   Intake/Output Summary (Last 24 hours) at 02/03/13 0913 Last data filed at 02/03/13 0856  Gross per 24 hour  Intake    720 ml  Output   2675 ml  Net  -1955 ml   Net neg:  6.1 L  General: Alert, awake, oriented x3, in no acute distress Neck:  JVP is normal Heart: Regular rate and rhythm, without murmurs, rubs, gallops.  Lungs: Somce decreased airflwo  Rare wheeze Exemities:  No edema.   Neuro: Grossly intact, nonfocal.  Tele:  SR  Lab Results: Results for orders placed during the hospital encounter of 01/31/13 (from the past 24 hour(s))  BASIC METABOLIC PANEL     Status: Abnormal   Collection Time    02/03/13  5:37 AM      Result Value Range   Sodium 144  135 - 145 mEq/L   Potassium 4.9  3.5 - 5.1 mEq/L   Chloride 104  96 - 112 mEq/L   CO2 37 (*) 19 - 32 mEq/L   Glucose, Bld 106 (*) 70 - 99 mg/dL   BUN 36 (*) 6 - 23 mg/dL   Creatinine, Ser 1.61 (*) 0.50 - 1.10 mg/dL   Calcium 9.7  8.4 - 09.6 mg/dL   GFR calc non Af Amer 45 (*) >90 mL/min   GFR calc Af Amer 53 (*) >90 mL/min    Studies/Results: @RISRSLT24 @  Medications:  Reviewed   @PROBHOSP @  1.  Acute on chronic diastolic CHF  Still with some increased volume on exam  Breathing is not back to normal  Continue diuresis  I have instructed patient on daily AM wts  She denies salt overuse.  Doesn't like salt    2.  HTN  Improved.  With breathing issues will switch ACEI to ARB.  Try to titrate O2.     LOS: 3 days   Dietrich Pates 02/03/2013, 9:13 AM

## 2013-02-03 NOTE — Consult Note (Signed)
Consult requested by:Dr. Felecia Shelling Consult requested for asthma:  HPI: This is an 79 year old African American female who was admitted with what Seems to be congestive heart failure. In addition to that she has a history of asthma. She does have some dementia so the history may not be totally accurate but she says that she developed asthma during childhood and then seemed to grow out of it and then started having trouble again she thinks about 20 years ago. She says she uses a nebulizer at home at least 3 or 4 times a day. She short of breath frequently. She has no other new complaints. She has not had chest pain. Past Medical History  Diagnosis Date  . Asthma   . Bronchitis      Family History  Problem Relation Age of Onset  . Arthritis    . Asthma    . Diabetes       History   Social History  . Marital Status: Divorced    Spouse Name: N/A    Number of Children: N/A  . Years of Education: 12th grade   Occupational History  . retired    Social History Main Topics  . Smoking status: Never Smoker   . Smokeless tobacco: None  . Alcohol Use: No  . Drug Use: No  . Sexual Activity: None   Other Topics Concern  . None   Social History Narrative  . None     ROS: She has not had hemoptysis. She doesn't have any swelling of her ankles. She does have wheezing.    Objective: Vital signs in last 24 hours: Temp:  [98 F (36.7 C)-98.8 F (37.1 C)] 98.6 F (37 C) (09/05 0525) Pulse Rate:  [63-66] 66 (09/05 0525) Resp:  [18] 18 (09/05 0525) BP: (119-129)/(60-76) 120/60 mmHg (09/05 0525) SpO2:  [93 %-95 %] 95 % (09/05 0525) Weight:  [100.971 kg (222 lb 9.6 oz)] 100.971 kg (222 lb 9.6 oz) (09/05 0404) Weight change:  Last BM Date: 02/02/13  Intake/Output from previous day: 09/04 0701 - 09/05 0700 In: 1080 [P.O.:1080] Out: 2500 [Urine:2500]  PHYSICAL EXAM She is awake and alert. She has no complaints. Her pupils are reactive. Her nose and throat are clear. Her neck is  supple without masses. Her chest shows bilateral end expiratory wheezes. Her heart is regular without gallop now. Her abdomen is soft. Central nervous system exam shows that she may be mildly confused  Lab Results: Basic Metabolic Panel:  Recent Labs  16/10/96 0522 02/03/13 0537  NA 144 144  K 4.8 4.9  CL 104 104  CO2 34* 37*  GLUCOSE 108* 106*  BUN 38* 36*  CREATININE 1.18* 1.11*  CALCIUM 9.1 9.7   Liver Function Tests: No results found for this basename: AST, ALT, ALKPHOS, BILITOT, PROT, ALBUMIN,  in the last 72 hours No results found for this basename: LIPASE, AMYLASE,  in the last 72 hours No results found for this basename: AMMONIA,  in the last 72 hours CBC:  Recent Labs  01/31/13 1230  WBC 7.6  NEUTROABS 5.1  HGB 13.2  HCT 38.3  MCV 80.1  PLT 178   Cardiac Enzymes:  Recent Labs  01/31/13 1230 01/31/13 1601  TROPONINI <0.30 <0.30   BNP:  Recent Labs  01/31/13 1230  PROBNP 4720.0*   D-Dimer: No results found for this basename: DDIMER,  in the last 72 hours CBG: No results found for this basename: GLUCAP,  in the last 72 hours Hemoglobin A1C: No results  found for this basename: HGBA1C,  in the last 72 hours Fasting Lipid Panel: No results found for this basename: CHOL, HDL, LDLCALC, TRIG, CHOLHDL, LDLDIRECT,  in the last 72 hours Thyroid Function Tests: No results found for this basename: TSH, T4TOTAL, FREET4, T3FREE, THYROIDAB,  in the last 72 hours Anemia Panel: No results found for this basename: VITAMINB12, FOLATE, FERRITIN, TIBC, IRON, RETICCTPCT,  in the last 72 hours Coagulation: No results found for this basename: LABPROT, INR,  in the last 72 hours Urine Drug Screen: Drugs of Abuse  No results found for this basename: labopia, cocainscrnur, labbenz, amphetmu, thcu, labbarb    Alcohol Level: No results found for this basename: ETH,  in the last 72 hours Urinalysis:  Recent Labs  01/31/13 1456  COLORURINE YELLOW  LABSPEC 1.015   PHURINE 5.0  GLUCOSEU NEGATIVE  HGBUR LARGE*  BILIRUBINUR NEGATIVE  KETONESUR NEGATIVE  PROTEINUR NEGATIVE  UROBILINOGEN 0.2  NITRITE NEGATIVE  LEUKOCYTESUR NEGATIVE   Misc. Labs:   ABGS: No results found for this basename: PHART, PCO2, PO2ART, TCO2, HCO3,  in the last 72 hours   MICROBIOLOGY: No results found for this or any previous visit (from the past 240 hour(s)).  Studies/Results: No results found.  Medications:  Prior to Admission:  Prescriptions prior to admission  Medication Sig Dispense Refill  . albuterol (PROVENTIL HFA;VENTOLIN HFA) 108 (90 BASE) MCG/ACT inhaler Inhale 2 puffs into the lungs every 6 (six) hours as needed for wheezing.      Marland Kitchen albuterol (PROVENTIL) (2.5 MG/3ML) 0.083% nebulizer solution Take 2.5 mg by nebulization every 6 (six) hours as needed for wheezing.      Marland Kitchen amLODipine-benazepril (LOTREL) 10-20 MG per capsule Take 1 capsule by mouth daily.      Marland Kitchen aspirin 81 MG tablet Take 81 mg by mouth daily.        Marland Kitchen docusate sodium (COLACE) 100 MG capsule Take 200 mg by mouth at bedtime.      . donepezil (ARICEPT) 10 MG tablet Take 10 mg by mouth at bedtime.      . famotidine (PEPCID) 20 MG tablet Take 20 mg by mouth daily.      . hydrochlorothiazide (MICROZIDE) 12.5 MG capsule Take 12.5 mg by mouth daily.      . mirtazapine (REMERON) 30 MG tablet Take 30 mg by mouth at bedtime.      . Multiple Vitamin (MULTIVITAMIN WITH MINERALS) TABS tablet Take 1 tablet by mouth daily.      Marland Kitchen oxybutynin (DITROPAN) 5 MG tablet Take 5 mg by mouth daily.      . pravastatin (PRAVACHOL) 80 MG tablet Take 80 mg by mouth daily.      . traMADol (ULTRAM) 50 MG tablet Take 50 mg by mouth 3 (three) times daily as needed for pain (Takes one tablet every morning and evening.).       Scheduled: . amLODipine  10 mg Oral Daily   And  . benazepril  20 mg Oral Daily  . aspirin  81 mg Oral Daily  . atorvastatin  20 mg Oral q1800  . docusate sodium  200 mg Oral QHS  . donepezil  10  mg Oral QHS  . enoxaparin (LOVENOX) injection  40 mg Subcutaneous Q24H  . famotidine  20 mg Oral Daily  . furosemide  40 mg Intravenous BID  . mirtazapine  30 mg Oral QHS  . mometasone-formoterol  2 puff Inhalation BID  . multivitamin with minerals  1 tablet Oral Daily  .  oxybutynin  5 mg Oral Daily  . potassium chloride  40 mEq Oral BID   Continuous:  ZOX:[id number], traMADol  Assesment: She was admitted with acute on chronic diastolic heart failure. I think that is causing some of her symptoms but she does have a history of asthma as well. She has apparently been on inhaled steroid but it is not controlling her symptoms. Considering that I am going to put her on a combined agent with long acting bronchodilator and inhaled steroid. Active Problems:   Acute on chronic diastolic heart failure    Plan: She will start that. She will have pulmonary function tests but that will be done as an outpatient.    LOS: 3 days   Arianis Bowditch L 02/03/2013, 8:41 AM

## 2013-02-04 DIAGNOSIS — J449 Chronic obstructive pulmonary disease, unspecified: Secondary | ICD-10-CM | POA: Diagnosis not present

## 2013-02-04 DIAGNOSIS — I10 Essential (primary) hypertension: Secondary | ICD-10-CM | POA: Diagnosis not present

## 2013-02-04 DIAGNOSIS — I509 Heart failure, unspecified: Secondary | ICD-10-CM | POA: Diagnosis not present

## 2013-02-04 DIAGNOSIS — F0280 Dementia in other diseases classified elsewhere without behavioral disturbance: Secondary | ICD-10-CM | POA: Diagnosis not present

## 2013-02-04 LAB — BASIC METABOLIC PANEL
GFR calc Af Amer: 51 mL/min — ABNORMAL LOW (ref >90)
GFR calc non Af Amer: 44 mL/min — ABNORMAL LOW (ref >90)
Glucose, Bld: 98 mg/dL (ref 70–99)
Potassium: 5.2 mEq/L — ABNORMAL HIGH (ref 3.5–5.1)
Sodium: 144 mEq/L (ref 135–145)

## 2013-02-04 LAB — PRO B NATRIURETIC PEPTIDE: Pro B Natriuretic peptide (BNP): 2586 pg/mL — ABNORMAL HIGH (ref 0–450)

## 2013-02-04 NOTE — Progress Notes (Signed)
Subjective: Patient's breathing is better. However, she feels very weak. Objective: Vital signs in last 24 hours: Temp:  [98.1 F (36.7 C)-98.6 F (37 C)] 98.6 F (37 C) (09/06 0422) Pulse Rate:  [68-93] 68 (09/06 0422) Resp:  [20] 20 (09/06 0422) BP: (107-138)/(63-72) 138/64 mmHg (09/06 0422) SpO2:  [91 %-96 %] 93 % (09/06 0422) Weight:  [100.472 kg (221 lb 8 oz)] 100.472 kg (221 lb 8 oz) (09/06 0422) Weight change: -0.499 kg (-1 lb 1.6 oz) Last BM Date: 02/02/13  Intake/Output from previous day: 09/05 0701 - 09/06 0700 In: 838 [P.O.:838] Out: 1475 [Urine:1475]  PHYSICAL EXAM General appearance: alert and no distress Resp: dullness to percussion bibasilar and rales bibasilar Cardio: S1, S2 normal GI: soft, non-tender; bowel sounds normal; no masses,  no organomegaly Extremities: edema 2++  Lab Results:    @labtest @ ABGS No results found for this basename: PHART, PCO2, PO2ART, TCO2, HCO3,  in the last 72 hours CULTURES No results found for this or any previous visit (from the past 240 hour(s)). Studies/Results: No results found.  Medications: I have reviewed the patient's current medications.  Assesment: 1. Acute CHF (Diastolic dysfunction) 2. Hypertension  3.H/O of Asthma  4.Dementia  5.Renal failure   Active Problems:   Acute on chronic diastolic heart failure    Plan: Continue Iv diuretics Physical therapy Pulmonary consult appreciated Will monitor BMP    LOS: 4 days   Stephnie Parlier 02/04/2013, 8:28 AM

## 2013-02-05 ENCOUNTER — Inpatient Hospital Stay (HOSPITAL_COMMUNITY): Payer: Medicare Other

## 2013-02-05 DIAGNOSIS — I10 Essential (primary) hypertension: Secondary | ICD-10-CM | POA: Diagnosis not present

## 2013-02-05 DIAGNOSIS — J449 Chronic obstructive pulmonary disease, unspecified: Secondary | ICD-10-CM | POA: Diagnosis not present

## 2013-02-05 DIAGNOSIS — J90 Pleural effusion, not elsewhere classified: Secondary | ICD-10-CM | POA: Diagnosis not present

## 2013-02-05 DIAGNOSIS — F0280 Dementia in other diseases classified elsewhere without behavioral disturbance: Secondary | ICD-10-CM | POA: Diagnosis not present

## 2013-02-05 DIAGNOSIS — I509 Heart failure, unspecified: Secondary | ICD-10-CM | POA: Diagnosis not present

## 2013-02-05 LAB — BLOOD GAS, ARTERIAL
O2 Content: 3 L/min
Patient temperature: 37
pH, Arterial: 7.381 (ref 7.350–7.450)

## 2013-02-05 LAB — BASIC METABOLIC PANEL
GFR calc Af Amer: 63 mL/min — ABNORMAL LOW (ref >90)
GFR calc non Af Amer: 54 mL/min — ABNORMAL LOW (ref >90)
Potassium: 5.1 mEq/L (ref 3.5–5.1)
Sodium: 144 mEq/L (ref 135–145)

## 2013-02-05 NOTE — Progress Notes (Signed)
Subjective: Patient feels weak. Her breathing is better. Objective: Vital signs in last 24 hours: Temp:  [98.9 F (37.2 C)-99.1 F (37.3 C)] 99.1 F (37.3 C) (09/07 0435) Pulse Rate:  [67-69] 69 (09/07 0435) Resp:  [20] 20 (09/07 0435) BP: (116-122)/(45-68) 116/45 mmHg (09/07 0435) SpO2:  [91 %-93 %] 93 % (09/07 0718) Weight:  [96.798 kg (213 lb 6.4 oz)] 96.798 kg (213 lb 6.4 oz) (09/07 0435) Weight change: -3.674 kg (-8 lb 1.6 oz) Last BM Date: 02/04/13  Intake/Output from previous day: 09/06 0701 - 09/07 0700 In: 858 [P.O.:858] Out: 1700 [Urine:1700]  PHYSICAL EXAM General appearance: alert and no distress Resp: dullness to percussion bibasilar and rales bibasilar Cardio: S1, S2 normal GI: soft, non-tender; bowel sounds normal; no masses,  no organomegaly Extremities: edema 2++  Lab Results:    @labtest @ ABGS No results found for this basename: PHART, PCO2, PO2ART, TCO2, HCO3,  in the last 72 hours CULTURES No results found for this or any previous visit (from the past 240 hour(s)). Studies/Results: No results found.  Medications: I have reviewed the patient's current medications.  Assesment: 1. Acute CHF (Diastolic dysfunction) 2. Hypertension  3.H/O of Asthma  4.Dementia  5.Renal failure   Active Problems:   Acute on chronic diastolic heart failure    Plan: Continue Iv diuretics Physical therapy ABG Chest x-ray Will monitor BMP    LOS: 5 days   Brittney Li 02/05/2013, 9:02 AM

## 2013-02-05 NOTE — Progress Notes (Signed)
CRITICAL VALUE ALERT  Critical value received:  pc02 61.9 po2 64.1 Bicarb 35.8  Date of notification:  02/05/2013  Time of notification:  0900  Critical value read back:yes  Nurse who received alert:  Chipper Herb, RN    MD notified (1st page):  Dr. Ouida Sills  Time of first page:  0910  MD notified (2nd page):  Time of second page:   Responding MD:  Dr. Ouida Sills  Time MD responded:  0915   No new orders given, MD to see patient, and nursing to continue to monitor.

## 2013-02-06 DIAGNOSIS — F0280 Dementia in other diseases classified elsewhere without behavioral disturbance: Secondary | ICD-10-CM | POA: Diagnosis not present

## 2013-02-06 DIAGNOSIS — I10 Essential (primary) hypertension: Secondary | ICD-10-CM | POA: Diagnosis not present

## 2013-02-06 DIAGNOSIS — J449 Chronic obstructive pulmonary disease, unspecified: Secondary | ICD-10-CM | POA: Diagnosis not present

## 2013-02-06 DIAGNOSIS — I509 Heart failure, unspecified: Secondary | ICD-10-CM | POA: Diagnosis not present

## 2013-02-06 MED ORDER — MOMETASONE FURO-FORMOTEROL FUM 200-5 MCG/ACT IN AERO: 2.0000 | INHALATION_SPRAY | Freq: Two times a day (BID) | RESPIRATORY_TRACT | Status: AC

## 2013-02-06 MED ORDER — FUROSEMIDE 40 MG PO TABS: 40.0000 mg | ORAL_TABLET | Freq: Every day | ORAL | Status: DC

## 2013-02-06 MED ORDER — POTASSIUM CHLORIDE ER 10 MEQ PO TBCR: 20.0000 meq | EXTENDED_RELEASE_TABLET | Freq: Two times a day (BID) | ORAL | Status: DC

## 2013-02-06 NOTE — Progress Notes (Signed)
Patient given educational packet on Living Better with Heart Failure, information include importance of daily weighing,taking medications,dietary restrictions regarding sodium intake,etc,patient verbalized understanding. A healthometer digital scale was also given to patient.

## 2013-02-06 NOTE — Plan of Care (Signed)
Problem: Discharge Progression Outcomes Goal: Pneumonia vaccine received if indicated Outcome: Not Applicable Date Met:  02/06/13 To follow up with PCP.

## 2013-02-06 NOTE — Plan of Care (Signed)
Problem: Discharge Progression Outcomes Goal: Dyspnea controlled Outcome: Completed/Met Date Met:  02/06/13 Patient being discharged with oxygen. Goal: O2 sats > or equal 90% or at baseline Outcome: Progressing Sats greater than or equal to to 90%, on 3 liters of nasal canula.

## 2013-02-06 NOTE — Discharge Summary (Signed)
Physician Discharge Summary  Patient ID: Brittney Li MRN: [id number] DOB/AGE: 1930-04-15 79 y.o. Primary Care Physician:Demitrus Francisco, MD Admit date: 01/31/2013 Discharge date: 02/06/2013    Discharge Diagnoses:  . Acute CHF (Diastolic dysfunction)  2. Hypertension  3.H/O of Asthma  4.Dementia  5.Renal failure 6. COPD    Active Problems:   Acute on chronic diastolic heart failure     Medication List    STOP taking these medications       hydrochlorothiazide 12.5 MG capsule  Commonly known as:  MICROZIDE      TAKE these medications       albuterol (2.5 MG/3ML) 0.083% nebulizer solution  Commonly known as:  PROVENTIL  Take 2.5 mg by nebulization every 6 (six) hours as needed for wheezing.     albuterol 108 (90 BASE) MCG/ACT inhaler  Commonly known as:  PROVENTIL HFA;VENTOLIN HFA  Inhale 2 puffs into the lungs every 6 (six) hours as needed for wheezing.     amLODipine-benazepril 10-20 MG per capsule  Commonly known as:  LOTREL  Take 1 capsule by mouth daily.     aspirin 81 MG tablet  Take 81 mg by mouth daily.     docusate sodium 100 MG capsule  Commonly known as:  COLACE  Take 200 mg by mouth at bedtime.     donepezil 10 MG tablet  Commonly known as:  ARICEPT  Take 10 mg by mouth at bedtime.     famotidine 20 MG tablet  Commonly known as:  PEPCID  Take 20 mg by mouth daily.     furosemide 40 MG tablet  Commonly known as:  LASIX  Take 1 tablet (40 mg total) by mouth daily.     mirtazapine 30 MG tablet  Commonly known as:  REMERON  Take 30 mg by mouth at bedtime.     mometasone-formoterol 200-5 MCG/ACT Aero  Commonly known as:  DULERA  Inhale 2 puffs into the lungs 2 (two) times daily.     multivitamin with minerals Tabs tablet  Take 1 tablet by mouth daily.     oxybutynin 5 MG tablet  Commonly known as:  DITROPAN  Take 5 mg by mouth daily.     potassium chloride 10 MEQ tablet  Commonly known as:  K-DUR  Take 2 tablets (20 mEq total) by  mouth 2 (two) times daily.     pravastatin 80 MG tablet  Commonly known as:  PRAVACHOL  Take 80 mg by mouth daily.     traMADol 50 MG tablet  Commonly known as:  ULTRAM  Take 50 mg by mouth 3 (three) times daily as needed for pain (Takes one tablet every morning and evening.).        Discharged Condition: improved    Consults: Cardiology and pulmonary  Significant Diagnostic Studies: Dg Chest 1 View  02/05/2013   *RADIOLOGY REPORT*  Clinical Data: Lethargic.  Weakness.  Question CHF.  CHEST - 1 VIEW  Comparison: 01/31/2013  Findings: Midline trachea.  Cardiomegaly accentuated by AP portable technique.  Suspect a small right pleural effusion. No pneumothorax.  Mild pulmonary venous congestion, slightly improved. Accentuated by mildly low lung volumes.  Similar left and worsened right base aeration.  IMPRESSION: Slightly improved interstitial edema/venous congestion.  Suspect a small right pleural effusion with developing adjacent airspace disease, likely atelectasis.   Original Report Authenticated By: Jeronimo Greaves, M.D.   Dg Chest Portable 1 View  01/31/2013   CLINICAL DATA:  79 year old female shortness of Breath.  EXAM: PORTABLE CHEST - 1 VIEW  COMPARISON:  09/19/2010 and earlier.  FINDINGS: Portable upright AP view at 1233 hrs. Lower lung volumes. Increased interstitial opacity most confluent at the lung bases. Trace fluid at the right minor fissure. Evidence of small pleural effusions. No pneumothorax or consolidation. Stable cardiac size and mediastinal contours.  IMPRESSION: Lower lung volumes with small pleural effusions and increased interstitial opacity favored to represent acute pulmonary edema. Vital/atypical respiratory infection is felt less likely.   Electronically Signed   By: Augusto Gamble   On: 01/31/2013 12:46    Lab Results: Basic Metabolic Panel:  Recent Labs  09/81/19 0627 02/05/13 0622  NA 144 144  K 5.2* 5.1  CL 103 105  CO2 36* 36*  GLUCOSE 98 104*  BUN 33* 30*   CREATININE 1.13* 0.96  CALCIUM 9.8 9.2   Liver Function Tests: No results found for this basename: AST, ALT, ALKPHOS, BILITOT, PROT, ALBUMIN,  in the last 72 hours   CBC: No results found for this basename: WBC, NEUTROABS, HGB, HCT, MCV, PLT,  in the last 72 hours  No results found for this or any previous visit (from the past 240 hour(s)).   Hospital Course:  This is an 79 years old female patient admitted due to worsening shortness of breath. She was started on diuresis. Her Echo showed grade II diastolic dysfunction. She was evaluated by both cardiologist and pulmonologist. Her medications were adjusted and discharged to be followed in out-patient.  Discharge Exam: Blood pressure 120/51, pulse 62, temperature 98 F (36.7 C), temperature source Oral, resp. rate 20, height 5\' 5"  (1.651 m), weight 99.927 kg (220 lb 4.8 oz), SpO2 100.00%.   Disposition:  Home        Follow-up Information   Follow up In 2 days. (they will call you)    Contact information:   [facility] [phone]      Follow up with [facility], MD In 1 week.   Specialty:  Internal Medicine   Contact information:   [address] [place] [address] [phone]       Signed: Avon Gully   02/06/2013, 8:08 AM

## 2013-02-06 NOTE — Progress Notes (Signed)
Patient's room air oxygen level 66% at rest. Oxygen 3 liters reapplied via nasal canula.Saturation 95% on 3 liters now.No c/o pain or discomfort noted.

## 2013-02-06 NOTE — Progress Notes (Addendum)
Patient discharged with instructions given on medications and follow up visits,patient verbalized understanding.Prescriptions sent to Pharmacy of choice that's documented on the AVS. Caretaker at bedside. No c/o pain or discomfort noted.Accompanied by staff to an awaiting vehicle.Patient also voiding without difficulty after foley was discontinued,output documented in Doc Flowsheets.

## 2013-02-06 NOTE — Plan of Care (Signed)
Problem: Discharge Progression Outcomes Goal: Independent ADLs or Home Health Care Outcome: Progressing Has Home Health aide that comes out and assistance with daily activities,bath,grooming,etc.

## 2013-02-09 DIAGNOSIS — J449 Chronic obstructive pulmonary disease, unspecified: Secondary | ICD-10-CM | POA: Diagnosis not present

## 2013-02-09 DIAGNOSIS — I509 Heart failure, unspecified: Secondary | ICD-10-CM | POA: Diagnosis not present

## 2013-02-14 DIAGNOSIS — E7800 Pure hypercholesterolemia, unspecified: Secondary | ICD-10-CM | POA: Diagnosis not present

## 2013-02-14 DIAGNOSIS — I10 Essential (primary) hypertension: Secondary | ICD-10-CM | POA: Diagnosis not present

## 2013-02-14 DIAGNOSIS — I509 Heart failure, unspecified: Secondary | ICD-10-CM | POA: Diagnosis not present

## 2013-02-17 DIAGNOSIS — I509 Heart failure, unspecified: Secondary | ICD-10-CM | POA: Diagnosis not present

## 2013-02-24 DIAGNOSIS — N39498 Other specified urinary incontinence: Secondary | ICD-10-CM | POA: Diagnosis not present

## 2013-02-27 ENCOUNTER — Emergency Department (HOSPITAL_COMMUNITY): Payer: Medicare Other

## 2013-02-27 ENCOUNTER — Encounter (HOSPITAL_COMMUNITY): Payer: Self-pay | Admitting: Emergency Medicine

## 2013-02-27 ENCOUNTER — Inpatient Hospital Stay (HOSPITAL_COMMUNITY)
Admission: EM | Admit: 2013-02-27 | Discharge: 2013-03-06 | DRG: 682 | Disposition: A | Discharge status: Skilled Nursing Facility | Payer: Medicare Other | Attending: Internal Medicine | Admitting: Internal Medicine

## 2013-02-27 DIAGNOSIS — E8779 Other fluid overload: Secondary | ICD-10-CM | POA: Diagnosis present

## 2013-02-27 DIAGNOSIS — E875 Hyperkalemia: Secondary | ICD-10-CM | POA: Diagnosis not present

## 2013-02-27 DIAGNOSIS — E876 Hypokalemia: Secondary | ICD-10-CM | POA: Diagnosis not present

## 2013-02-27 DIAGNOSIS — M7542 Impingement syndrome of left shoulder: Secondary | ICD-10-CM

## 2013-02-27 DIAGNOSIS — N183 Chronic kidney disease, stage 3 unspecified: Secondary | ICD-10-CM | POA: Diagnosis present

## 2013-02-27 DIAGNOSIS — J9601 Acute respiratory failure with hypoxia: Secondary | ICD-10-CM

## 2013-02-27 DIAGNOSIS — M6281 Muscle weakness (generalized): Secondary | ICD-10-CM | POA: Diagnosis not present

## 2013-02-27 DIAGNOSIS — J9600 Acute respiratory failure, unspecified whether with hypoxia or hypercapnia: Secondary | ICD-10-CM | POA: Diagnosis not present

## 2013-02-27 DIAGNOSIS — I10 Essential (primary) hypertension: Secondary | ICD-10-CM | POA: Diagnosis not present

## 2013-02-27 DIAGNOSIS — M12819 Other specific arthropathies, not elsewhere classified, unspecified shoulder: Secondary | ICD-10-CM

## 2013-02-27 DIAGNOSIS — M25519 Pain in unspecified shoulder: Secondary | ICD-10-CM

## 2013-02-27 DIAGNOSIS — K219 Gastro-esophageal reflux disease without esophagitis: Secondary | ICD-10-CM

## 2013-02-27 DIAGNOSIS — J45909 Unspecified asthma, uncomplicated: Secondary | ICD-10-CM

## 2013-02-27 DIAGNOSIS — E871 Hypo-osmolality and hyponatremia: Secondary | ICD-10-CM | POA: Diagnosis not present

## 2013-02-27 DIAGNOSIS — R5381 Other malaise: Secondary | ICD-10-CM | POA: Diagnosis not present

## 2013-02-27 DIAGNOSIS — K5909 Other constipation: Secondary | ICD-10-CM

## 2013-02-27 DIAGNOSIS — M7989 Other specified soft tissue disorders: Secondary | ICD-10-CM | POA: Diagnosis not present

## 2013-02-27 DIAGNOSIS — Z6835 Body mass index (BMI) 35.0-35.9, adult: Secondary | ICD-10-CM

## 2013-02-27 DIAGNOSIS — Z87891 Personal history of nicotine dependence: Secondary | ICD-10-CM | POA: Diagnosis not present

## 2013-02-27 DIAGNOSIS — E1169 Type 2 diabetes mellitus with other specified complication: Secondary | ICD-10-CM | POA: Diagnosis present

## 2013-02-27 DIAGNOSIS — J811 Chronic pulmonary edema: Secondary | ICD-10-CM | POA: Diagnosis present

## 2013-02-27 DIAGNOSIS — Z8679 Personal history of other diseases of the circulatory system: Secondary | ICD-10-CM

## 2013-02-27 DIAGNOSIS — J9621 Acute and chronic respiratory failure with hypoxia: Secondary | ICD-10-CM | POA: Diagnosis present

## 2013-02-27 DIAGNOSIS — R32 Unspecified urinary incontinence: Secondary | ICD-10-CM

## 2013-02-27 DIAGNOSIS — I1310 Hypertensive heart and chronic kidney disease without heart failure, with stage 1 through stage 4 chronic kidney disease, or unspecified chronic kidney disease: Secondary | ICD-10-CM | POA: Diagnosis present

## 2013-02-27 DIAGNOSIS — Z8673 Personal history of transient ischemic attack (TIA), and cerebral infarction without residual deficits: Secondary | ICD-10-CM | POA: Diagnosis not present

## 2013-02-27 DIAGNOSIS — R262 Difficulty in walking, not elsewhere classified: Secondary | ICD-10-CM | POA: Diagnosis not present

## 2013-02-27 DIAGNOSIS — E162 Hypoglycemia, unspecified: Secondary | ICD-10-CM | POA: Diagnosis not present

## 2013-02-27 DIAGNOSIS — R0902 Hypoxemia: Secondary | ICD-10-CM | POA: Diagnosis not present

## 2013-02-27 DIAGNOSIS — Z7982 Long term (current) use of aspirin: Secondary | ICD-10-CM

## 2013-02-27 DIAGNOSIS — I517 Cardiomegaly: Secondary | ICD-10-CM | POA: Diagnosis not present

## 2013-02-27 DIAGNOSIS — E785 Hyperlipidemia, unspecified: Secondary | ICD-10-CM | POA: Diagnosis present

## 2013-02-27 DIAGNOSIS — J4489 Other specified chronic obstructive pulmonary disease: Secondary | ICD-10-CM | POA: Diagnosis present

## 2013-02-27 DIAGNOSIS — G459 Transient cerebral ischemic attack, unspecified: Secondary | ICD-10-CM | POA: Diagnosis not present

## 2013-02-27 DIAGNOSIS — F0390 Unspecified dementia without behavioral disturbance: Secondary | ICD-10-CM | POA: Diagnosis present

## 2013-02-27 DIAGNOSIS — I6789 Other cerebrovascular disease: Secondary | ICD-10-CM | POA: Diagnosis not present

## 2013-02-27 DIAGNOSIS — Z79899 Other long term (current) drug therapy: Secondary | ICD-10-CM | POA: Diagnosis not present

## 2013-02-27 DIAGNOSIS — G5600 Carpal tunnel syndrome, unspecified upper limb: Secondary | ICD-10-CM

## 2013-02-27 DIAGNOSIS — M6530 Trigger finger, unspecified finger: Secondary | ICD-10-CM

## 2013-02-27 DIAGNOSIS — J449 Chronic obstructive pulmonary disease, unspecified: Secondary | ICD-10-CM | POA: Diagnosis present

## 2013-02-27 DIAGNOSIS — R6889 Other general symptoms and signs: Secondary | ICD-10-CM | POA: Diagnosis not present

## 2013-02-27 DIAGNOSIS — J309 Allergic rhinitis, unspecified: Secondary | ICD-10-CM

## 2013-02-27 DIAGNOSIS — N179 Acute kidney failure, unspecified: Principal | ICD-10-CM | POA: Diagnosis present

## 2013-02-27 DIAGNOSIS — F329 Major depressive disorder, single episode, unspecified: Secondary | ICD-10-CM

## 2013-02-27 DIAGNOSIS — I130 Hypertensive heart and chronic kidney disease with heart failure and stage 1 through stage 4 chronic kidney disease, or unspecified chronic kidney disease: Secondary | ICD-10-CM | POA: Diagnosis present

## 2013-02-27 DIAGNOSIS — F068 Other specified mental disorders due to known physiological condition: Secondary | ICD-10-CM

## 2013-02-27 DIAGNOSIS — I5031 Acute diastolic (congestive) heart failure: Secondary | ICD-10-CM | POA: Diagnosis not present

## 2013-02-27 DIAGNOSIS — J9819 Other pulmonary collapse: Secondary | ICD-10-CM | POA: Diagnosis not present

## 2013-02-27 DIAGNOSIS — I509 Heart failure, unspecified: Secondary | ICD-10-CM | POA: Diagnosis present

## 2013-02-27 DIAGNOSIS — J42 Unspecified chronic bronchitis: Secondary | ICD-10-CM

## 2013-02-27 DIAGNOSIS — M19032 Primary osteoarthritis, left wrist: Secondary | ICD-10-CM

## 2013-02-27 DIAGNOSIS — I5033 Acute on chronic diastolic (congestive) heart failure: Secondary | ICD-10-CM | POA: Diagnosis not present

## 2013-02-27 DIAGNOSIS — M25539 Pain in unspecified wrist: Secondary | ICD-10-CM

## 2013-02-27 DIAGNOSIS — J90 Pleural effusion, not elsewhere classified: Secondary | ICD-10-CM | POA: Diagnosis not present

## 2013-02-27 DIAGNOSIS — N6019 Diffuse cystic mastopathy of unspecified breast: Secondary | ICD-10-CM

## 2013-02-27 DIAGNOSIS — R42 Dizziness and giddiness: Secondary | ICD-10-CM

## 2013-02-27 HISTORY — DX: Unspecified osteoarthritis, unspecified site: M19.90

## 2013-02-27 HISTORY — DX: Essential (primary) hypertension: I10

## 2013-02-27 HISTORY — DX: Type 2 diabetes mellitus without complications: E11.9

## 2013-02-27 HISTORY — DX: Chronic obstructive pulmonary disease, unspecified: J44.9

## 2013-02-27 HISTORY — DX: Heart failure, unspecified: I50.9

## 2013-02-27 LAB — BASIC METABOLIC PANEL
BUN: 59 mg/dL — ABNORMAL HIGH (ref 6–23)
Calcium: 9.9 mg/dL (ref 8.4–10.5)
GFR calc Af Amer: 18 mL/min — ABNORMAL LOW (ref >90)
GFR calc non Af Amer: 16 mL/min — ABNORMAL LOW (ref >90)
Potassium: >7.5 mEq/L (ref 3.5–5.1)

## 2013-02-27 LAB — CBC WITH DIFFERENTIAL/PLATELET
Basophils Absolute: 0 10*3/uL (ref 0.0–0.1)
Basophils Relative: 0 % (ref 0–1)
Eosinophils Absolute: 0.1 10*3/uL (ref 0.0–0.7)
MCH: 26.8 pg (ref 26.0–34.0)
MCHC: 33.6 g/dL (ref 30.0–36.0)
Monocytes Absolute: 0.7 10*3/uL (ref 0.1–1.0)
Neutro Abs: 5 10*3/uL (ref 1.7–7.7)
Neutrophils Relative %: 68 % (ref 43–77)
RDW: 16.6 % — ABNORMAL HIGH (ref 11.5–15.5)

## 2013-02-27 MED ORDER — SODIUM CHLORIDE 0.9 % IV SOLN
1.0000 g | Freq: Once | INTRAVENOUS | Status: AC
  Administered 2013-02-28: 1 g via INTRAVENOUS
  Filled 2013-02-27: qty 10

## 2013-02-27 MED ORDER — INSULIN ASPART 100 UNIT/ML ~~LOC~~ SOLN
8.0000 [IU] | Freq: Once | SUBCUTANEOUS | Status: AC
  Administered 2013-02-28: 8 [IU] via INTRAVENOUS

## 2013-02-27 MED ORDER — FUROSEMIDE 10 MG/ML IJ SOLN
40.0000 mg | Freq: Once | INTRAMUSCULAR | Status: AC
  Administered 2013-02-28: 40 mg via INTRAVENOUS
  Filled 2013-02-27: qty 4

## 2013-02-27 MED ORDER — DEXTROSE 50 % IV SOLN
INTRAVENOUS | Status: AC
  Filled 2013-02-27: qty 50

## 2013-02-27 MED ORDER — SODIUM BICARBONATE 8.4 % IV SOLN
50.0000 meq | Freq: Once | INTRAVENOUS | Status: AC
  Administered 2013-02-28: 50 meq via INTRAVENOUS
  Filled 2013-02-27: qty 50

## 2013-02-27 MED ORDER — DEXTROSE 50 % IV SOLN
1.0000 | Freq: Once | INTRAVENOUS | Status: AC
  Administered 2013-02-28: 50 mL via INTRAVENOUS

## 2013-02-27 MED ORDER — CALCIUM CHLORIDE 10 % IV SOLN
INTRAVENOUS | Status: AC
  Filled 2013-02-27: qty 10

## 2013-02-27 MED ORDER — SODIUM POLYSTYRENE SULFONATE 15 GM/60ML PO SUSP
30.0000 g | Freq: Once | ORAL | Status: AC
  Administered 2013-02-28: 30 g via ORAL
  Filled 2013-02-27: qty 120

## 2013-02-27 MED ORDER — FUROSEMIDE 40 MG PO TABS: 40.0000 mg | ORAL_TABLET | Freq: Every day | ORAL | Status: DC

## 2013-02-27 NOTE — ED Notes (Signed)
Pt states it hurts to lift legs and arms. Pt's speech is slurred at times and she has poor grasp bilateral.

## 2013-02-27 NOTE — ED Notes (Signed)
Pt c/o sob and inability to walk since 5am.

## 2013-02-28 ENCOUNTER — Encounter (HOSPITAL_COMMUNITY): Payer: Self-pay | Admitting: Internal Medicine

## 2013-02-28 DIAGNOSIS — I5033 Acute on chronic diastolic (congestive) heart failure: Secondary | ICD-10-CM

## 2013-02-28 DIAGNOSIS — E162 Hypoglycemia, unspecified: Secondary | ICD-10-CM | POA: Diagnosis not present

## 2013-02-28 DIAGNOSIS — J9621 Acute and chronic respiratory failure with hypoxia: Secondary | ICD-10-CM | POA: Diagnosis present

## 2013-02-28 DIAGNOSIS — N179 Acute kidney failure, unspecified: Principal | ICD-10-CM

## 2013-02-28 DIAGNOSIS — E875 Hyperkalemia: Secondary | ICD-10-CM | POA: Diagnosis present

## 2013-02-28 DIAGNOSIS — J9600 Acute respiratory failure, unspecified whether with hypoxia or hypercapnia: Secondary | ICD-10-CM

## 2013-02-28 DIAGNOSIS — I5031 Acute diastolic (congestive) heart failure: Secondary | ICD-10-CM | POA: Diagnosis present

## 2013-02-28 LAB — CBC
HCT: 33.7 % — ABNORMAL LOW (ref 36.0–46.0)
Platelets: 141 10*3/uL — ABNORMAL LOW (ref 150–400)
RBC: 4.36 MIL/uL (ref 3.87–5.11)
RDW: 16.2 % — ABNORMAL HIGH (ref 11.5–15.5)
WBC: 8.6 10*3/uL (ref 4.0–10.5)

## 2013-02-28 LAB — POCT I-STAT, CHEM 8
BUN: 59 mg/dL — ABNORMAL HIGH (ref 6–23)
Calcium, Ion: 1.32 mmol/L — ABNORMAL HIGH (ref 1.13–1.30)
Calcium, Ion: 1.36 mmol/L — ABNORMAL HIGH (ref 1.13–1.30)
Chloride: 102 mEq/L (ref 96–112)
Chloride: 103 mEq/L (ref 96–112)
Creatinine, Ser: 2.4 mg/dL — ABNORMAL HIGH (ref 0.50–1.10)
Creatinine, Ser: 2.4 mg/dL — ABNORMAL HIGH (ref 0.50–1.10)
Glucose, Bld: 71 mg/dL (ref 70–99)
Glucose, Bld: 50 mg/dL — ABNORMAL LOW (ref 70–99)
HCT: 36 % (ref 36.0–46.0)
Hemoglobin: 11.9 g/dL — ABNORMAL LOW (ref 12.0–15.0)
Hemoglobin: 12.2 g/dL (ref 12.0–15.0)
Potassium: 8 mEq/L (ref 3.5–5.1)
Potassium: 6.9 mEq/L (ref 3.5–5.1)
Sodium: 131 mEq/L — ABNORMAL LOW (ref 135–145)
TCO2: 29 mmol/L (ref 0–100)

## 2013-02-28 LAB — BASIC METABOLIC PANEL
BUN: 53 mg/dL — ABNORMAL HIGH (ref 6–23)
BUN: 49 mg/dL — ABNORMAL HIGH (ref 6–23)
CO2: 30 mEq/L (ref 19–32)
Calcium: 10.2 mg/dL (ref 8.4–10.5)
Calcium: 10 mg/dL (ref 8.4–10.5)
Calcium: 9.2 mg/dL (ref 8.4–10.5)
Chloride: 98 mEq/L (ref 96–112)
Chloride: 100 mEq/L (ref 96–112)
Creatinine, Ser: 2.16 mg/dL — ABNORMAL HIGH (ref 0.50–1.10)
GFR calc Af Amer: 22 mL/min — ABNORMAL LOW (ref >90)
GFR calc non Af Amer: 19 mL/min — ABNORMAL LOW (ref >90)
GFR calc non Af Amer: 23 mL/min — ABNORMAL LOW (ref >90)
Glucose, Bld: 78 mg/dL (ref 70–99)
Glucose, Bld: 78 mg/dL (ref 70–99)
Glucose, Bld: 84 mg/dL (ref 70–99)
Potassium: 5.8 mEq/L — ABNORMAL HIGH (ref 3.5–5.1)
Sodium: 133 mEq/L — ABNORMAL LOW (ref 135–145)

## 2013-02-28 LAB — POCT I-STAT 3, ART BLOOD GAS (G3+)
Acid-Base Excess: 2 mmol/L (ref 0.0–2.0)
Bicarbonate: 29.2 mEq/L — ABNORMAL HIGH (ref 20.0–24.0)
O2 Saturation: 90 %
pO2, Arterial: 65 mmHg — ABNORMAL LOW (ref 80.0–100.0)

## 2013-02-28 LAB — URINALYSIS, ROUTINE W REFLEX MICROSCOPIC
Bilirubin Urine: NEGATIVE
Glucose, UA: NEGATIVE mg/dL
Glucose, UA: NEGATIVE mg/dL
Nitrite: NEGATIVE
Nitrite: NEGATIVE
Protein, ur: 100 mg/dL — AB
Protein, ur: 100 mg/dL — AB
Protein, ur: 30 mg/dL — AB
Specific Gravity, Urine: 1.025 (ref 1.005–1.030)
Specific Gravity, Urine: 1.006 (ref 1.005–1.030)
Specific Gravity, Urine: 1.008 (ref 1.005–1.030)
Urobilinogen, UA: 1 mg/dL (ref 0.0–1.0)
pH: 5 (ref 5.0–8.0)

## 2013-02-28 LAB — URINE MICROSCOPIC-ADD ON

## 2013-02-28 LAB — GLUCOSE, CAPILLARY
Glucose-Capillary: 153 mg/dL — ABNORMAL HIGH (ref 70–99)
Glucose-Capillary: 57 mg/dL — ABNORMAL LOW (ref 70–99)
Glucose-Capillary: 82 mg/dL (ref 70–99)

## 2013-02-28 MED ORDER — ALBUTEROL SULFATE HFA 108 (90 BASE) MCG/ACT IN AERS
2.0000 | INHALATION_SPRAY | Freq: Four times a day (QID) | RESPIRATORY_TRACT | Status: DC | PRN
  Filled 2013-02-28: qty 6.7

## 2013-02-28 MED ORDER — AMLODIPINE BESYLATE 10 MG PO TABS
10.0000 mg | ORAL_TABLET | Freq: Every day | ORAL | Status: DC
  Administered 2013-02-28 – 2013-03-06 (×7): 10 mg via ORAL
  Filled 2013-02-28 (×7): qty 1

## 2013-02-28 MED ORDER — OXYBUTYNIN CHLORIDE 5 MG PO TABS
5.0000 mg | ORAL_TABLET | Freq: Every day | ORAL | Status: DC
  Administered 2013-02-28 – 2013-03-06 (×7): 5 mg via ORAL
  Filled 2013-02-28 (×7): qty 1

## 2013-02-28 MED ORDER — CALCIUM CHLORIDE 10 % IV SOLN
1.0000 g | Freq: Once | INTRAVENOUS | Status: DC
  Filled 2013-02-28: qty 10

## 2013-02-28 MED ORDER — SODIUM CHLORIDE 0.9 % IV SOLN
1.0000 g | Freq: Once | INTRAVENOUS | Status: AC
  Administered 2013-02-28: 1 g via INTRAVENOUS

## 2013-02-28 MED ORDER — DEXTROSE 50 % IV SOLN
INTRAVENOUS | Status: AC
  Filled 2013-02-28: qty 50

## 2013-02-28 MED ORDER — MOMETASONE FURO-FORMOTEROL FUM 200-5 MCG/ACT IN AERO
2.0000 | INHALATION_SPRAY | Freq: Two times a day (BID) | RESPIRATORY_TRACT | Status: DC
  Administered 2013-02-28 – 2013-03-06 (×10): 2 via RESPIRATORY_TRACT
  Filled 2013-02-28 (×2): qty 8.8

## 2013-02-28 MED ORDER — ALBUTEROL SULFATE (5 MG/ML) 0.5% IN NEBU
2.5000 mg | INHALATION_SOLUTION | RESPIRATORY_TRACT | Status: DC | PRN
  Filled 2013-02-28: qty 0.5

## 2013-02-28 MED ORDER — ADULT MULTIVITAMIN W/MINERALS CH
1.0000 | ORAL_TABLET | Freq: Every day | ORAL | Status: DC
  Administered 2013-02-28 – 2013-03-06 (×7): 1 via ORAL
  Filled 2013-02-28 (×7): qty 1

## 2013-02-28 MED ORDER — MIRTAZAPINE 30 MG PO TABS
30.0000 mg | ORAL_TABLET | Freq: Every day | ORAL | Status: DC
  Administered 2013-02-28 – 2013-03-05 (×6): 30 mg via ORAL
  Filled 2013-02-28 (×7): qty 1

## 2013-02-28 MED ORDER — HEPARIN SODIUM (PORCINE) 5000 UNIT/ML IJ SOLN
5000.0000 [IU] | Freq: Three times a day (TID) | INTRAMUSCULAR | Status: DC
  Administered 2013-02-28 – 2013-03-06 (×19): 5000 [IU] via SUBCUTANEOUS
  Filled 2013-02-28 (×22): qty 1

## 2013-02-28 MED ORDER — FAMOTIDINE 20 MG PO TABS
20.0000 mg | ORAL_TABLET | Freq: Every day | ORAL | Status: DC
  Administered 2013-02-28 – 2013-03-06 (×7): 20 mg via ORAL
  Filled 2013-02-28 (×7): qty 1

## 2013-02-28 MED ORDER — SIMVASTATIN 40 MG PO TABS
40.0000 mg | ORAL_TABLET | Freq: Every day | ORAL | Status: DC
  Administered 2013-02-28: 40 mg via ORAL
  Filled 2013-02-28 (×2): qty 1

## 2013-02-28 MED ORDER — INSULIN ASPART 100 UNIT/ML ~~LOC~~ SOLN
5.0000 [IU] | Freq: Once | SUBCUTANEOUS | Status: DC
  Filled 2013-02-28: qty 1

## 2013-02-28 MED ORDER — SODIUM CHLORIDE 0.9 % IJ SOLN
3.0000 mL | Freq: Two times a day (BID) | INTRAMUSCULAR | Status: DC
  Administered 2013-02-28: 6 mL via INTRAVENOUS
  Administered 2013-02-28 – 2013-03-06 (×11): 3 mL via INTRAVENOUS

## 2013-02-28 MED ORDER — FUROSEMIDE 10 MG/ML IJ SOLN
80.0000 mg | Freq: Once | INTRAMUSCULAR | Status: AC
  Administered 2013-02-28: 80 mg via INTRAVENOUS
  Filled 2013-02-28: qty 8

## 2013-02-28 MED ORDER — DEXTROSE 50 % IV SOLN
1.0000 | Freq: Once | INTRAVENOUS | Status: AC
  Administered 2013-02-28: 50 mL via INTRAVENOUS
  Filled 2013-02-28: qty 50

## 2013-02-28 MED ORDER — DONEPEZIL HCL 10 MG PO TABS
10.0000 mg | ORAL_TABLET | Freq: Every day | ORAL | Status: DC
  Administered 2013-02-28 – 2013-03-05 (×6): 10 mg via ORAL
  Filled 2013-02-28 (×9): qty 1

## 2013-02-28 MED ORDER — FUROSEMIDE 10 MG/ML IJ SOLN
80.0000 mg | Freq: Two times a day (BID) | INTRAMUSCULAR | Status: DC
  Administered 2013-02-28: 80 mg via INTRAVENOUS
  Filled 2013-02-28 (×3): qty 8

## 2013-02-28 MED ORDER — ASPIRIN EC 81 MG PO TBEC
81.0000 mg | DELAYED_RELEASE_TABLET | Freq: Every day | ORAL | Status: DC
  Administered 2013-02-28 – 2013-03-06 (×7): 81 mg via ORAL
  Filled 2013-02-28 (×7): qty 1

## 2013-02-28 MED ORDER — CHLORHEXIDINE GLUCONATE 0.12 % MT SOLN
15.0000 mL | Freq: Two times a day (BID) | OROMUCOSAL | Status: DC
  Administered 2013-02-28 (×2): 15 mL via OROMUCOSAL

## 2013-02-28 MED ORDER — SODIUM POLYSTYRENE SULFONATE 15 GM/60ML PO SUSP
30.0000 g | Freq: Once | ORAL | Status: AC
  Administered 2013-02-28: 30 g via ORAL

## 2013-02-28 MED ORDER — DEXTROSE 5 % IV SOLN
Freq: Once | INTRAVENOUS | Status: AC
  Administered 2013-02-28: 08:00:00 via INTRAVENOUS

## 2013-02-28 MED ORDER — SODIUM BICARBONATE 8.4 % IV SOLN
INTRAVENOUS | Status: DC
  Administered 2013-02-28: 09:00:00 via INTRAVENOUS
  Filled 2013-02-28 (×2): qty 150

## 2013-02-28 MED ORDER — DEXTROSE 10 % IV SOLN
INTRAVENOUS | Status: DC
  Administered 2013-02-28: 15:00:00 via INTRAVENOUS

## 2013-02-28 MED ORDER — BIOTENE DRY MOUTH MT LIQD
15.0000 mL | Freq: Two times a day (BID) | OROMUCOSAL | Status: DC
  Administered 2013-02-28 (×2): 15 mL via OROMUCOSAL

## 2013-02-28 MED ORDER — INSULIN ASPART 100 UNIT/ML ~~LOC~~ SOLN
5.0000 [IU] | Freq: Once | SUBCUTANEOUS | Status: AC
  Administered 2013-02-28: 5 [IU] via INTRAVENOUS

## 2013-02-28 NOTE — ED Provider Notes (Signed)
CSN: [id number]     Arrival date & time 02/27/13  2130 History   First MD Initiated Contact with Patient 02/27/13 2137     Chief Complaint  Patient presents with  . Shortness of Breath  . Fatigue   (Consider location/radiation/quality/duration/timing/severity/associated sxs/prior Treatment) The history is limited by the condition of the patient.   History provided by patient. Has history of CHF, HTN and renal failure presented with shortness of breath to [facility] urgency department and was evaluated with imaging and labs and found to be hyperkalemic. Hospitalist at that facility was not comfortable admitting the patient and recommended transfer to [facility] emergency department for further evaluation. Patient received IV calcium, insulin and bicarbonate. She was also given Kayexalate. On arrival to [facility] ED, patient was evaluated and is now asymptomatic without any difficulty breathing. No chest pain. No complaints. Past Medical History  Diagnosis Date  . Asthma   . Bronchitis   . CHF (congestive heart failure)   . COPD (chronic obstructive pulmonary disease)   . Hypertension   . Diabetes mellitus without complication   . Arthritis    Past Surgical History  Procedure Laterality Date  . Hands     Family History  Problem Relation Age of Onset  . Arthritis    . Asthma    . Diabetes     History  Substance Use Topics  . Smoking status: Never Smoker   . Smokeless tobacco: Not on file  . Alcohol Use: No   OB History   Grav Para Term Preterm Abortions TAB SAB Ect Mult Living                 Review of Systems  Constitutional: Negative for fever and chills.  HENT: Negative for neck pain and neck stiffness.   Respiratory: Positive for shortness of breath.   Cardiovascular: Negative for chest pain.  Gastrointestinal: Negative for abdominal pain.  Genitourinary: Negative for hematuria.  Musculoskeletal: Negative for back pain.  Skin: Negative for rash.  Neurological: Negative  for headaches.  All other systems reviewed and are negative.    Allergies  Erythromycin and Penicillins  Home Medications   Current Outpatient Rx  Name  Route  Sig  Dispense  Refill  . albuterol (PROVENTIL HFA;VENTOLIN HFA) 108 (90 BASE) MCG/ACT inhaler   Inhalation   Inhale 2 puffs into the lungs every 6 (six) hours as needed for wheezing.         Marland Kitchen albuterol (PROVENTIL) (2.5 MG/3ML) 0.083% nebulizer solution   Nebulization   Take 2.5 mg by nebulization every 6 (six) hours as needed for wheezing.         Marland Kitchen amLODipine-benazepril (LOTREL) 10-20 MG per capsule   Oral   Take 1 capsule by mouth daily.         Marland Kitchen aspirin 81 MG tablet   Oral   Take 81 mg by mouth daily.           Marland Kitchen docusate sodium (COLACE) 100 MG capsule   Oral   Take 200 mg by mouth at bedtime.         . donepezil (ARICEPT) 10 MG tablet   Oral   Take 10 mg by mouth at bedtime.         . famotidine (PEPCID) 20 MG tablet   Oral   Take 20 mg by mouth daily.         . furosemide (LASIX) 40 MG tablet   Oral   Take 1  tablet (40 mg total) by mouth daily.   30 tablet   3   . mirtazapine (REMERON) 30 MG tablet   Oral   Take 30 mg by mouth at bedtime.         . mometasone-formoterol (DULERA) 200-5 MCG/ACT AERO   Inhalation   Inhale 2 puffs into the lungs 2 (two) times daily.   1 Inhaler   3   . Multiple Vitamin (MULTIVITAMIN WITH MINERALS) TABS tablet   Oral   Take 1 tablet by mouth daily.         Marland Kitchen oxybutynin (DITROPAN) 5 MG tablet   Oral   Take 5 mg by mouth daily.         . potassium chloride (K-DUR) 10 MEQ tablet   Oral   Take 2 tablets (20 mEq total) by mouth 2 (two) times daily.   60 tablet   3   . pravastatin (PRAVACHOL) 80 MG tablet   Oral   Take 80 mg by mouth daily.         . traMADol (ULTRAM) 50 MG tablet   Oral   Take 50 mg by mouth 3 (three) times daily as needed for pain (Takes one tablet every morning and evening.).          BP 133/65  Pulse 55   Temp(Src) 99 F (37.2 C) (Oral)  Resp 24  Ht 5\' 5"  (1.651 m)  Wt 217 lb (98.431 kg)  BMI 36.11 kg/m2  SpO2 95% Physical Exam  Constitutional: She is oriented to person, place, and time. She appears well-developed and well-nourished.  HENT:  Head: Normocephalic and atraumatic.  Eyes: EOM are normal. Pupils are equal, round, and reactive to light.  Neck: Neck supple.  Cardiovascular: Regular rhythm and intact distal pulses.   Pulmonary/Chest: Effort normal.  Mildly decreased bilateral breath sounds without respiratory distress  Musculoskeletal: Normal range of motion.  Symmetric LE edema  Neurological: She is alert and oriented to person, place, and time.  Skin: Skin is warm and dry.    ED Course  Procedures (including critical care time) Labs Review Labs Reviewed  CBC WITH DIFFERENTIAL - Abnormal; Notable for the following:    Hemoglobin 11.1 (*)    HCT 33.0 (*)    RDW 16.6 (*)    All other components within normal limits  PRO B NATRIURETIC PEPTIDE - Abnormal; Notable for the following:    Pro B Natriuretic peptide (BNP) 4992.0 (*)    All other components within normal limits  BASIC METABOLIC PANEL - Abnormal; Notable for the following:    Sodium 123 (*)    Potassium >7.5 (*)    Chloride 90 (*)    BUN 59 (*)    Creatinine, Ser 2.67 (*)    GFR calc non Af Amer 16 (*)    GFR calc Af Amer 18 (*)    All other components within normal limits  URINALYSIS, ROUTINE W REFLEX MICROSCOPIC - Abnormal; Notable for the following:    APPearance HAZY (*)    Hgb urine dipstick LARGE (*)    Protein, ur 100 (*)    Leukocytes, UA TRACE (*)    All other components within normal limits  GLUCOSE, CAPILLARY - Abnormal; Notable for the following:    Glucose-Capillary 57 (*)    All other components within normal limits  URINE MICROSCOPIC-ADD ON - Abnormal; Notable for the following:    Squamous Epithelial / LPF MANY (*)    Bacteria, UA MANY (*)  Casts GRANULAR CAST (*)    All other  components within normal limits  URINALYSIS, ROUTINE W REFLEX MICROSCOPIC - Abnormal; Notable for the following:    APPearance TURBID (*)    Hgb urine dipstick LARGE (*)    Protein, ur 100 (*)    Leukocytes, UA LARGE (*)    All other components within normal limits  URINE MICROSCOPIC-ADD ON - Abnormal; Notable for the following:    Bacteria, UA MANY (*)    Casts HYALINE CASTS (*)    All other components within normal limits  POCT I-STAT, CHEM 8 - Abnormal; Notable for the following:    Sodium 131 (*)    Potassium 8.0 (*)    BUN 66 (*)    Creatinine, Ser 2.40 (*)    Calcium, Ion 1.32 (*)    Hemoglobin 11.9 (*)    HCT 35.0 (*)    All other components within normal limits  POCT I-STAT, CHEM 8 - Abnormal; Notable for the following:    Sodium 134 (*)    Potassium 6.9 (*)    BUN 59 (*)    Creatinine, Ser 2.40 (*)    Glucose, Bld 50 (*)    Calcium, Ion 1.36 (*)    All other components within normal limits  URINE CULTURE  URINE CULTURE  MRSA PCR SCREENING  TROPONIN I  URINALYSIS, ROUTINE W REFLEX MICROSCOPIC   Imaging Review Dg Chest Port 1 View  02/27/2013   CLINICAL DATA:  Shortness of breath, fatigue.  EXAM: PORTABLE CHEST - 1 VIEW  COMPARISON:  02/05/2013  FINDINGS: Mild cardiomegaly. Diffuse interstitial prominence throughout the lungs is stable since prior study, likely mild pulmonary edema. Bibasilar atelectasis and small effusions.  IMPRESSION: Findings similar to prior study with diffuse interstitial prominence, bibasilar atelectasis and small effusions. Suspect mild interstitial edema.   Electronically Signed   By: Charlett Nose M.D.   On: 02/27/2013 22:20   CRITICAL CARE Performed by: Sunnie Nielsen Total critical care time: 40 Critical care time was exclusive of separately billable procedures and treating other patients. Critical care was necessary to treat or prevent imminent or life-threatening deterioration. Critical care was time spent personally by me on the following  activities: development of treatment plan with patient and/or surrogate as well as nursing, discussions with consultants, evaluation of patient's response to treatment, examination of patient, obtaining history from patient or surrogate, ordering and performing treatments and interventions, ordering and review of laboratory studies, ordering and review of radiographic studies, pulse oximetry and re-evaluation of patient's condition. Repeated IV insulin and D50. REN consult. Renal consult. Repeat labs and EKG    Date: 02/28/2013  Rate: 52  Rhythm: sinus bradycardia  QRS Axis: left  Intervals: Wide QRS  ST/T Wave abnormalities: nonspecific ST changes  Conduction Disutrbances:right bundle branch block  Narrative Interpretation:   Old EKG Reviewed: changes noted previous EKG dated 01/31/2013 demonstrates right bundle-branch block with QRS 130  3:22 AM d/w DR Conley Rolls, will admit, requesting REN consult for possible dialysis 3:22 AM d/w Dr Hyman Hopes, would like repeat potassium / chem 8 now to help determine need for dialysis versus MED managemnet  3:42 AM repeat potassium 8.0 - discussed again with Dr. Hyman Hopes recommends repeat calcium, give insulin and D50 now, start D5W drip with 3 amps of bicarbonate at 75 per hour, and a total of 60 g of Kayexalate. Will repeat i-STAT chemistry panel and 1 hour. He will evaluate patient bedside.   MDM   Diagnosis: Acute renal failure, hyperkalemia  Repeat labs, repeat EKG  Persistent hyperkalemia with further medications provided  Previous chest x-ray labs reviewed Renal consult and bedside evaluation MED consult and admit  Sunnie Nielsen, MD 02/28/13 [phone]

## 2013-02-28 NOTE — Progress Notes (Signed)
[facility] Progress Note [place] TEAM 1 - Stepdown ICU Team   Brittney Li YNW:[id number] DOB: 04-20-1930 DOA: 02/27/2013 PCP: Avon Gully, MD  Brief narrative: 79 y.o. female with hx of CHF, HTN, on Benazepril along with K supplements, DM, COPD, presented to [facility] ER with weakness. She was found to be in ARF with Cr of 2.5, K greater than 9.0 mEq/L with EKG showed widened QRS complex and symmetrically peaked Twaves. She was given Calcium Chloride, Insulin and Dextrose, along with 30 grams of Kayexalate. She was transferred subsequently to [facility] ER for further treatment. In the [facility] ER, she was found to have K of 8.0 mEq/L again with persistent widened QRS when compared to older EKG. She endorsed she has been feeling weaker recently. Last week, she was able to ambulate, but she is to weak to walk now. She was in the nursing home, but it was not helping, so she returned to her home.   Assessment/Plan: Active Problems:   Acute renal failure (ARF)/Acute hyperkalemia -suspect precipitated by ACE I/K supplements pre admit -K+ trending down with use of KAYEXALATE -briefly on Bicarb drip -prior EKG changes have resolved -Scr trending slowly to baseline -Nephro following/no indications for HD    Acute respiratory failure with hypoxia/Pulmonary edema -seen on CXR and has crackles on exam -likely due to ARF and initial aggressive fluid administration -will dc IVF's -was given Lasix with good results- will continue BID for now -continue supportive care    HYPERTENSION -BP controlled but given need for diuresis may wish to hold Norvasc -pre admit ACE I on hold    CKD (chronic kidney disease) stage 3, GFR 30-59 ml/min -baseline Scr 1.13    Hypoglycemia - unclear etiology -not on OHA's or insulin at home -begin low dose D10 for now    Abnormal urinalysis -Followup on cultures -Deferred empiric antibiotics for now since otherwise asymptomatic    DEMENTIA, MILD   CEREBROVASCULAR ACCIDENT, HX OF -may need swallow eval - follow for s/s aspiration   DVT prophylaxis: Subcutaneous heparin Code Status: Full Family Communication: Patient-no family at bedside Disposition Plan/Expected LOS: Stepdown Isolation: None Nutritional Status: Suspect acute protein calorie malnutrition related to acute illness-even history of CVA and recent hospitalization may also have her degree of chronic protein calorie malnutrition  Consultants: Nephrology  Procedures: None  Antibiotics: None  HPI/Subjective: Patient awake but quite lethargic. No apparent complaints of shortness of breath, abdominal pain, dysuria, chest pain, muscular cramps.  Objective: Blood pressure 104/44, pulse 72, temperature 98.4 F (36.9 C), temperature source Oral, resp. rate 17, height 5\' 5"  (1.651 m), weight 98.431 kg (217 lb), SpO2 91.00%.  Intake/Output Summary (Last 24 hours) at 02/28/13 1418 Last data filed at 02/28/13 1400  Gross per 24 hour  Intake  573.5 ml  Output   2800 ml  Net -2226.5 ml     Exam: Followup exam completed. Patient admitted at 5:17 AM today.  Scheduled Meds:  Scheduled Meds: . amLODipine  10 mg Oral Daily  . antiseptic oral rinse  15 mL Mouth Rinse q12n4p  . aspirin EC  81 mg Oral Daily  . chlorhexidine  15 mL Mouth Rinse BID  . donepezil  10 mg Oral QHS  . famotidine  20 mg Oral Daily  . [START ON 03/01/2013] furosemide  80 mg Intravenous Q12H  . heparin  5,000 Units Subcutaneous Q8H  . mirtazapine  30 mg Oral QHS  . mometasone-formoterol  2 puff Inhalation BID  . multivitamin with minerals  1 tablet Oral Daily  . oxybutynin  5 mg Oral Daily  . simvastatin  40 mg Oral QHS  . sodium chloride  3 mL Intravenous Q12H   Continuous Infusions: . dextrose      **Reviewed in detail by the Attending Physicia  Data Reviewed: Basic Metabolic Panel:  Recent Labs Lab 02/27/13 2158 02/28/13 0336 02/28/13 0550 02/28/13 0945  NA 123* 131* 134* 133*    K >7.5* 8.0* 6.9* 5.8*  CL 90* 102 103 98  CO2 28  --   --  24  GLUCOSE 94 71 50* 78  BUN 59* 66* 59* 54*  CREATININE 2.67* 2.40* 2.40* 2.27*  CALCIUM 9.9  --   --  10.2   Liver Function Tests: No results found for this basename: AST, ALT, ALKPHOS, BILITOT, PROT, ALBUMIN,  in the last 168 hours No results found for this basename: LIPASE, AMYLASE,  in the last 168 hours No results found for this basename: AMMONIA,  in the last 168 hours CBC:  Recent Labs Lab 02/27/13 2158 02/28/13 0336 02/28/13 0550 02/28/13 0945  WBC 7.3  --   --  8.6  NEUTROABS 5.0  --   --   --   HGB 11.1* 11.9* 12.2 11.8*  HCT 33.0* 35.0* 36.0 33.7*  MCV 79.7  --   --  77.3*  PLT 151  --   --  141*   Cardiac Enzymes:  Recent Labs Lab 02/27/13 2158  TROPONINI <0.30   BNP (last 3 results)  Recent Labs  01/31/13 1230 02/04/13 0627 02/27/13 2158  PROBNP 4720.0* 2586.0* 4992.0*   CBG:  Recent Labs Lab 02/28/13 0131 02/28/13 0150 02/28/13 0646 02/28/13 1132 02/28/13 1405  GLUCAP 57* 153* 82 91 77    Recent Results (from the past 240 hour(s))  MRSA PCR SCREENING     Status: None   Collection Time    02/28/13  6:54 AM      Result Value Range Status   MRSA by PCR NEGATIVE  NEGATIVE Final   Comment:            The GeneXpert MRSA Assay (FDA     approved for NASAL specimens     only), is one component of a     comprehensive MRSA colonization     surveillance program. It is not     intended to diagnose MRSA     infection nor to guide or     monitor treatment for     MRSA infections.     Studies:  Recent x-ray studies have been reviewed in detail by the Attending Physician    Junious Silk, ANP [facility] Office  [phone] Pager [phone]  **If unable to reach the above provider after paging please contact the Flow Manager @ [phone]  On-Call/Text Page:      [email or web address]      password [email or web address]  If 7PM-7AM, please contact night-coverage [email or web address] Password  [email or web address] 02/28/2013, 2:18 PM   LOS: 1 day   I have examined the patient, reviewed the chart and modified the above note which I agree with.   Torrian Canion,MD [phone] 02/28/2013, 5:21 PM

## 2013-02-28 NOTE — ED Notes (Signed)
Pt alert, NAD, calm, interactive, resps e/u, speaking in clear complete sentences, skin W&D, foley draining to gravity, NS liter hanging locked off. Pt updated with plan/ admission.

## 2013-02-28 NOTE — ED Provider Notes (Signed)
CSN: [id number]     Arrival date & time 02/27/13  2130 History   First MD Initiated Contact with Patient 02/27/13 2137     Chief Complaint  Patient presents with  . Shortness of Breath  . Fatigue     HPI  Pt with h/o Asthma/COPD.  Recent (9/9) admit to [facility] with New CHF, EF 60%, Diastolic dysfunction.  Diuresed, improved, D/Cd to Rehab.  Progressive weakness, and leg edema.  Unable to walk this morning, transferred to [facility] ED by EMS  Past Medical History  Diagnosis Date  . Asthma   . Bronchitis   . CHF (congestive heart failure)   . COPD (chronic obstructive pulmonary disease)   . Hypertension   . Diabetes mellitus without complication   . Arthritis    Past Surgical History  Procedure Laterality Date  . Hands     Family History  Problem Relation Age of Onset  . Arthritis    . Asthma    . Diabetes     History  Substance Use Topics  . Smoking status: Never Smoker   . Smokeless tobacco: Not on file  . Alcohol Use: No   OB History   Grav Para Term Preterm Abortions TAB SAB Ect Mult Living                 Review of Systems  Constitutional: Negative for fever, chills, diaphoresis, appetite change and fatigue.  HENT: Negative for sore throat, mouth sores and trouble swallowing.   Eyes: Negative for visual disturbance.  Respiratory: Positive for chest tightness. Negative for cough, shortness of breath and wheezing.   Cardiovascular: Positive for chest pain and leg swelling.  Gastrointestinal: Negative for nausea, vomiting, abdominal pain, diarrhea and abdominal distention.  Endocrine: Negative for polydipsia, polyphagia and polyuria.  Genitourinary: Negative for dysuria, frequency and hematuria.  Musculoskeletal: Negative for gait problem.  Skin: Negative for color change, pallor and rash.  Neurological: Positive for weakness. Negative for dizziness, syncope, light-headedness and headaches.  Hematological: Does not bruise/bleed easily.  Psychiatric/Behavioral:  Negative for behavioral problems and confusion.    Allergies  Erythromycin and Penicillins  Home Medications   Current Outpatient Rx  Name  Route  Sig  Dispense  Refill  . albuterol (PROVENTIL HFA;VENTOLIN HFA) 108 (90 BASE) MCG/ACT inhaler   Inhalation   Inhale 2 puffs into the lungs every 6 (six) hours as needed for wheezing.         Marland Kitchen albuterol (PROVENTIL) (2.5 MG/3ML) 0.083% nebulizer solution   Nebulization   Take 2.5 mg by nebulization every 6 (six) hours as needed for wheezing.         Marland Kitchen amLODipine-benazepril (LOTREL) 10-20 MG per capsule   Oral   Take 1 capsule by mouth daily.         Marland Kitchen aspirin 81 MG tablet   Oral   Take 81 mg by mouth daily.           Marland Kitchen docusate sodium (COLACE) 100 MG capsule   Oral   Take 200 mg by mouth at bedtime.         . donepezil (ARICEPT) 10 MG tablet   Oral   Take 10 mg by mouth at bedtime.         . famotidine (PEPCID) 20 MG tablet   Oral   Take 20 mg by mouth daily.         . furosemide (LASIX) 40 MG tablet   Oral   Take 1 tablet (  40 mg total) by mouth daily.   30 tablet   3   . mirtazapine (REMERON) 30 MG tablet   Oral   Take 30 mg by mouth at bedtime.         . mometasone-formoterol (DULERA) 200-5 MCG/ACT AERO   Inhalation   Inhale 2 puffs into the lungs 2 (two) times daily.   1 Inhaler   3   . Multiple Vitamin (MULTIVITAMIN WITH MINERALS) TABS tablet   Oral   Take 1 tablet by mouth daily.         Marland Kitchen oxybutynin (DITROPAN) 5 MG tablet   Oral   Take 5 mg by mouth daily.         . potassium chloride (K-DUR) 10 MEQ tablet   Oral   Take 2 tablets (20 mEq total) by mouth 2 (two) times daily.   60 tablet   3   . pravastatin (PRAVACHOL) 80 MG tablet   Oral   Take 80 mg by mouth daily.         . traMADol (ULTRAM) 50 MG tablet   Oral   Take 50 mg by mouth 3 (three) times daily as needed for pain (Takes one tablet every morning and evening.).          BP 149/62  Pulse 72  Temp(Src) 98.4  F (36.9 C) (Oral)  Resp 24  Ht 5\' 5"  (1.651 m)  Wt 217 lb (98.431 kg)  BMI 36.11 kg/m2  SpO2 93% Physical Exam  Constitutional: She is oriented to person, place, and time. No distress.  HENT:  Head: Normocephalic.  Eyes: Conjunctivae are normal. Pupils are equal, round, and reactive to light. No scleral icterus.  Neck: Normal range of motion. Neck supple. No thyromegaly present.  Cardiovascular: Normal rate, regular rhythm, S1 normal and S2 normal.  Exam reveals no gallop and no friction rub.   No murmur heard. Wide QRS.  Perfusing each beat with strong radial pulses.  Dependent edema 2-3+  Pulmonary/Chest: Effort normal. No respiratory distress. She has decreased breath sounds. She has no wheezes. She has rales.  Abdominal: Soft. Bowel sounds are normal. She exhibits no distension. There is no tenderness. There is no rebound.  Musculoskeletal: Normal range of motion.  Neurological: She is alert and oriented to person, place, and time.  Skin: Skin is warm and dry. No rash noted.  Psychiatric: She has a normal mood and affect. Her behavior is normal.    ED Course  CRITICAL CARE Performed by: Roney Marion Authorized by: Rolland Porter J Total critical care time: 36 minutes Critical care start time: 02/27/2013 11:56 PM Critical care end time: 02/28/2013 12:32 AM Critical care time was exclusive of separately billable procedures and treating other patients. Critical care was necessary to treat or prevent imminent or life-threatening deterioration of the following conditions: cardiac failure (hyperkalemia with rhythm change, and CHF). Critical care was time spent personally by me on the following activities: discussions with consultants, evaluation of patient's response to treatment, examination of patient, obtaining history from patient or surrogate, ordering and performing treatments and interventions, ordering and review of laboratory studies and re-evaluation of patient's condition.    (including critical care time)  EKG:  No p waves, wide QRS, Peaked T waves.   Labs Review Labs Reviewed  CBC WITH DIFFERENTIAL - Abnormal; Notable for the following:    Hemoglobin 11.1 (*)    HCT 33.0 (*)    RDW 16.6 (*)    All other components  within normal limits  PRO B NATRIURETIC PEPTIDE - Abnormal; Notable for the following:    Pro B Natriuretic peptide (BNP) 4992.0 (*)    All other components within normal limits  BASIC METABOLIC PANEL - Abnormal; Notable for the following:    Sodium 123 (*)    Potassium >7.5 (*)    Chloride 90 (*)    BUN 59 (*)    Creatinine, Ser 2.67 (*)    GFR calc non Af Amer 16 (*)    GFR calc Af Amer 18 (*)    All other components within normal limits  URINE CULTURE  TROPONIN I  URINALYSIS, ROUTINE W REFLEX MICROSCOPIC   Imaging Review Dg Chest Port 1 View  02/27/2013   CLINICAL DATA:  Shortness of breath, fatigue.  EXAM: PORTABLE CHEST - 1 VIEW  COMPARISON:  02/05/2013  FINDINGS: Mild cardiomegaly. Diffuse interstitial prominence throughout the lungs is stable since prior study, likely mild pulmonary edema. Bibasilar atelectasis and small effusions.  IMPRESSION: Findings similar to prior study with diffuse interstitial prominence, bibasilar atelectasis and small effusions. Suspect mild interstitial edema.   Electronically Signed   By: Charlett Nose M.D.   On: 02/27/2013 22:20    MDM  No diagnosis found. Pt hyperkalemic.  Wide QRS, c Peaked T wave, no P.  Treated acutely in ED with IV CaCl, NaHco3, Insulin/D50, Lasix, PO Kayexolate. Hypoxemic, requiring 6l [place] O2, CHF on CXR.  Discussed with [facility]--Agree with Transfer to [facility].   Roney Marion, MD 02/28/13 [phone]

## 2013-02-28 NOTE — ED Notes (Signed)
Critical Potassium results shown to Dr.Opitz

## 2013-02-28 NOTE — Consult Note (Addendum)
PULMONARY  / CRITICAL CARE MEDICINE  Name: Brittney Li MRN: [id number] DOB: 11/14/1929    ADMISSION DATE:  02/27/2013 CONSULTATION DATE:  02/28/2013  PRIMARY SERVICE: TRH  CHIEF COMPLAINT:  Weakness, sob, bilateral LE edema  BRIEF PATIENT DESCRIPTION: 79 y.o female with PMH of CHF, HTN, on Benazepril along with K supplements, DM, COPD who presented to [facility] ED with weakness, shortness of breath, bilateral leg edema. She was found to be in ARF with Cr of 2.5, K greater than 9.0 mEq/L with EKG showed widen QRS complex and symmetrical peak Twaves. She was given Calcium Chloride, Insulin and Dextrose, along with 30 grams of Kayexalate. She was transferred subsequently to [facility] ER for further treatment. In the ER, she was found to have K of 8.0 mEq/L again with persistent widen QRS compared to older EKG. She was admitted to [facility] 9/30 for hyperkalemia with further evaluation and monitoring required by PCCM.   SIGNIFICANT EVENTS / STUDIES:  9/29 Admitted to [facility] for hyperkalemia and ARF 9/30 continue monitoring hyperkalemia and ARF by PCCM  LINES / TUBES: Peripheral IVs Foley Cath 9/30>>> Rectal Tube 9/30>>>  CULTURES: MRSA PCR>>> Neg Urine Culture 9/30 >>>  ANTIBIOTICS: None  HISTORY OF PRESENT ILLNESS:  79 y.o female with PMH of CHF, HTN, on Benazepril along with K supplements, DM, COPD who presented to [facility] ED with weakness. She was found to be in ARF with Cr of 2.5, K greater than 9.0 mEq/L with EKG showed widen QRS complex and symmetrical peak Twaves. She was given Calcium Chloride, Insulin and Dextrose, along with 30 grams of Kayexalate. She was transferred subsequently to [facility] ER for further treatment. In the ER, she was found to have K of 8.0 mEq/L again with persistent widen QRS compared to older EKG. A repeat potassium revealed a potassium of 8.0 although at time of evaluation the EKG rhythm strip showed increased heart rate and narrowing of QRS. She also was found to have had a  large bowel movement.    PAST MEDICAL HISTORY :  Past Medical History  Diagnosis Date  . Asthma   . Bronchitis   . CHF (congestive heart failure)   . COPD (chronic obstructive pulmonary disease)   . Hypertension   . Diabetes mellitus without complication   . Arthritis    Past Surgical History  Procedure Laterality Date  . Hands     Prior to Admission medications   Medication Sig Start Date End Date Taking? Authorizing Provider  albuterol (PROVENTIL HFA;VENTOLIN HFA) 108 (90 BASE) MCG/ACT inhaler Inhale 2 puffs into the lungs every 6 (six) hours as needed for wheezing.   Yes Historical Provider, MD  albuterol (PROVENTIL) (2.5 MG/3ML) 0.083% nebulizer solution Take 2.5 mg by nebulization every 6 (six) hours as needed for wheezing.   Yes Historical Provider, MD  amLODipine-benazepril (LOTREL) 10-20 MG per capsule Take 1 capsule by mouth daily.   Yes Historical Provider, MD  aspirin 81 MG tablet Take 81 mg by mouth daily.     Yes Historical Provider, MD  docusate sodium (COLACE) 100 MG capsule Take 200 mg by mouth at bedtime.   Yes Historical Provider, MD  donepezil (ARICEPT) 10 MG tablet Take 10 mg by mouth at bedtime.   Yes Historical Provider, MD  famotidine (PEPCID) 20 MG tablet Take 20 mg by mouth daily.   Yes Historical Provider, MD  furosemide (LASIX) 40 MG tablet Take 1 tablet (40 mg total) by mouth daily. 02/06/13  Yes Tesfaye Fanta,  MD  mirtazapine (REMERON) 30 MG tablet Take 30 mg by mouth at bedtime.   Yes Historical Provider, MD  mometasone-formoterol (DULERA) 200-5 MCG/ACT AERO Inhale 2 puffs into the lungs 2 (two) times daily. 02/06/13  Yes Avon Gully, MD  Multiple Vitamin (MULTIVITAMIN WITH MINERALS) TABS tablet Take 1 tablet by mouth daily.   Yes Historical Provider, MD  oxybutynin (DITROPAN) 5 MG tablet Take 5 mg by mouth daily.   Yes Historical Provider, MD  potassium chloride (K-DUR) 10 MEQ tablet Take 2 tablets (20 mEq total) by mouth 2 (two) times daily. 02/06/13  Yes  Avon Gully, MD  pravastatin (PRAVACHOL) 80 MG tablet Take 80 mg by mouth daily.   Yes Historical Provider, MD  traMADol (ULTRAM) 50 MG tablet Take 50 mg by mouth 3 (three) times daily as needed for pain (Takes one tablet every morning and evening.).   Yes Historical Provider, MD   Allergies  Allergen Reactions  . Erythromycin Other (See Comments)    Unknown   . Penicillins Other (See Comments)    Unknown     FAMILY HISTORY:  Family History  Problem Relation Age of Onset  . Arthritis    . Asthma    . Diabetes     SOCIAL HISTORY:  reports that she has never smoked. She does not have any smokeless tobacco history on file. She reports that she does not drink alcohol or use illicit drugs.   SUBJECTIVE: Patient is resting comfortably but responds appropriately to commands. On Nasal Cannula at 4 L. No complaints at this time.   VITAL SIGNS: Temp:  [98.4 F (36.9 C)-99 F (37.2 C)] 98.8 F (37.1 C) (09/30 0648) Pulse Rate:  [48-102] 86 (09/30 0745) Resp:  [15-24] 15 (09/30 0745) BP: (128-171)/(47-98) 137/96 mmHg (09/30 0745) SpO2:  [68 %-97 %] 95 % (09/30 0745) Weight:  [217 lb (98.431 kg)] 217 lb (98.431 kg) (09/29 2204)    INTAKE / OUTPUT: Intake/Output     09/29 0701 - 09/30 0700 09/30 0701 - 10/01 0700   I.V. (mL/kg) 400 (4.1)    Total Intake(mL/kg) 400 (4.1)    Urine (mL/kg/hr) 425 400 (3.4)   Total Output 425 400   Net -25 -400          PHYSICAL EXAMINATION: General:  Elderly female, NAD, resting comfortably Neuro: Sleeping, responds appropriately,  MAEs HEENT: NCAT, PERRL, EOMI, nasal cannula in place Cardiovascular:  RRR, no m/r/g Lungs:  Clear to auscultation bilaterally, normal respirations with out accessory muscle use Abdomen:  Soft, non tender, non distended, + BS Extremities:  1+ edema bilaterally, skin warm and dry   LABS:  CBC Recent Labs     02/27/13  2158  02/28/13  0336  02/28/13  0550  WBC  7.3   --    --   HGB  11.1*  11.9*  12.2  HCT   33.0*  35.0*  36.0  PLT  151   --    --    Coag's No results found for this basename: APTT, INR,  in the last 72 hours BMET Recent Labs     02/27/13  2158  02/28/13  0336  02/28/13  0550  NA  123*  131*  134*  K  >7.5*  8.0*  6.9*  CL  90*  102  103  CO2  28   --    --   BUN  59*  66*  59*  CREATININE  2.67*  2.40*  2.40*  GLUCOSE  94  71  50*   Electrolytes Recent Labs     02/27/13  2158  CALCIUM  9.9   Cardiac Enzymes Recent Labs     02/27/13  2158  TROPONINI  <0.30  PROBNP  4992.0*   Glucose Recent Labs     02/28/13  0131  02/28/13  0150  GLUCAP  57*  153*    Imaging Dg Chest Port 1 View  02/27/2013   CLINICAL DATA:  Shortness of breath, fatigue.  EXAM: PORTABLE CHEST - 1 VIEW  COMPARISON:  02/05/2013  FINDINGS: Mild cardiomegaly. Diffuse interstitial prominence throughout the lungs is stable since prior study, likely mild pulmonary edema. Bibasilar atelectasis and small effusions.  IMPRESSION: Findings similar to prior study with diffuse interstitial prominence, bibasilar atelectasis and small effusions. Suspect mild interstitial edema.   Electronically Signed   By: Charlett Nose M.D.   On: 02/27/2013 22:20     CXR 9/29: bilateral interstitial opacities, bibasilar atelectasis most likely interstitial edema secondary to CHF  ASSESSMENT / PLAN:  PULMONARY A: COPD Hx Asthma Hx Bronchitis P:   - Continue Dulera  -Continue Lasix -repeat CXR -abg reviewed, some concern acidotic, if K rises, may need NIMV  CARDIOVASCULAR A:  Elevated BNP (4992) CHF hypertension P:  -Repeat BNP -Repeat EKGs / tele -continue Amlodipine - continue ASA -recommend chf consult ( i have called them), diastolic dz? or new reduction in lv fxn or valvular dz -May need dopamine for beta agonistic help, start if crt does not improve firther  RENAL A:   Acute renal failure- secondary to K supplementation and Benezapril use? Hyponatremia Hyperkalemia Hyperlipidemia P:    -IV fluids + bicarb until K corrects further - continue Kayexalate - Continue Simvastatin  -continue Oxybutinin  - monitor BMET  GASTROINTESTINAL A:   Consider starting modified diet P:   -Continue Pepcid - Continue MVI  HEMATOLOGIC A:   No acute issues P:  - Continue Heparin per DVT prophylaxis   INFECTIOUS A:   No acute issues P:   - monitor CBC and fevers  ENDOCRINE A:   Type II DM hypoglycemia P:   - Monitor CBGs - SSI - Continue Dextrose -TSH -BMET frequent  NEUROLOGIC A:  Mild dementia P:   - supportive care  TODAY'S SUMMARY: Patient resting comfortably in NAD. Consider movement out of ICU once K+ WNL and stabilzed. CHF consult in am, called, echo repeat needed.  I have personally obtained a history, examined the patient, evaluated laboratory and imaging results, formulated the assessment and plan and placed orders.  Mcarthur Rossetti. Tyson Alias, MD, FACP Pgr: [phone] [facility]  Pulmonary and Critical Care Medicine [facility] Pager: [phone]  02/28/2013, 8:11 AM

## 2013-02-28 NOTE — ED Notes (Signed)
Dr. Conley Rolls (admitting hospitalist) into room.

## 2013-02-28 NOTE — H&P (Signed)
[facility] History and Physical  MYLEEN BRAILSFORD ZOX:[id number] DOB: 03/09/1930    PCP:   Avon Gully, MD   Chief Complaint: weakness.  Found to have K greater than 9 mEq/L with EKG changes.  HPI: Brittney Li is an 79 y.o. female with hx of CHF, HTN, on Benazepril along with K supplements, DM, COPD, presnets to [facility] ER with weakness.  She was found to be in ARF with Cr of 2.5, K greater than 9.0 mEq/L with EKG showed widen QRS complex and symmetrical peak Twaves.  She was given Calcium Chloride, Insulin and Dextrose, along with 30 grams of Kayexalate.  She was transferred subsequently to [facility] ER for further treatment.  In the ER, she was found to have K of 8.0 mEq/L again with persistent widen QRS compared to older EKG.  Hospitalist was asked to admit her for hyperkalemia.  She has been feeling weaker recently.  Last week, she was able to ambulate, but she is to weak to walk now.  She was in the nursing home, but it was not helping, so she returned home now.  Rewiew of Systems:  Constitutional: Negative for malaise, fever and chills. No significant weight loss or weight gain Eyes: Negative for eye pain, redness and discharge, diplopia, visual changes, or flashes of light. ENMT: Negative for ear pain, hoarseness, nasal congestion, sinus pressure and sore throat. No headaches; tinnitus, drooling, or problem swallowing. Cardiovascular: Negative for chest pain, palpitations, diaphoresis, dyspnea and peripheral edema. ; No orthopnea, PND Respiratory: Negative for cough, hemoptysis, wheezing and stridor. No pleuritic chestpain. Gastrointestinal: Negative for nausea, vomiting, diarrhea, constipation, abdominal pain, melena, blood in stool, hematemesis, jaundice and rectal bleeding.    Genitourinary: Negative for frequency, dysuria, incontinence,flank pain and hematuria; Musculoskeletal: Negative for back pain and neck pain. Negative for swelling and trauma.;  Skin: . Negative for pruritus,  rash, abrasions, bruising and skin lesion.; ulcerations Neuro: Negative for headache, lightheadedness and neck stiffness. Negative for altered level of consciousness , altered mental status,involuntary movement, seizure and syncope.  Psych: negative for anxiety, depression, insomnia, tearfulness, panic attacks, hallucinations, paranoia, suicidal or homicidal ideation.   Past Medical History  Diagnosis Date  . Asthma   . Bronchitis   . CHF (congestive heart failure)   . COPD (chronic obstructive pulmonary disease)   . Hypertension   . Diabetes mellitus without complication   . Arthritis     Past Surgical History  Procedure Laterality Date  . Hands      Medications:  HOME MEDS: Prior to Admission medications   Medication Sig Start Date End Date Taking? Authorizing Provider  albuterol (PROVENTIL HFA;VENTOLIN HFA) 108 (90 BASE) MCG/ACT inhaler Inhale 2 puffs into the lungs every 6 (six) hours as needed for wheezing.   Yes Historical Provider, MD  albuterol (PROVENTIL) (2.5 MG/3ML) 0.083% nebulizer solution Take 2.5 mg by nebulization every 6 (six) hours as needed for wheezing.   Yes Historical Provider, MD  amLODipine-benazepril (LOTREL) 10-20 MG per capsule Take 1 capsule by mouth daily.   Yes Historical Provider, MD  aspirin 81 MG tablet Take 81 mg by mouth daily.     Yes Historical Provider, MD  docusate sodium (COLACE) 100 MG capsule Take 200 mg by mouth at bedtime.   Yes Historical Provider, MD  donepezil (ARICEPT) 10 MG tablet Take 10 mg by mouth at bedtime.   Yes Historical Provider, MD  famotidine (PEPCID) 20 MG tablet Take 20 mg by mouth daily.   Yes Historical Provider, MD  furosemide (LASIX) 40 MG tablet Take 1 tablet (40 mg total) by mouth daily. 02/06/13  Yes Avon Gully, MD  mirtazapine (REMERON) 30 MG tablet Take 30 mg by mouth at bedtime.   Yes Historical Provider, MD  mometasone-formoterol (DULERA) 200-5 MCG/ACT AERO Inhale 2 puffs into the lungs 2 (two) times daily.  02/06/13  Yes Avon Gully, MD  Multiple Vitamin (MULTIVITAMIN WITH MINERALS) TABS tablet Take 1 tablet by mouth daily.   Yes Historical Provider, MD  oxybutynin (DITROPAN) 5 MG tablet Take 5 mg by mouth daily.   Yes Historical Provider, MD  potassium chloride (K-DUR) 10 MEQ tablet Take 2 tablets (20 mEq total) by mouth 2 (two) times daily. 02/06/13  Yes Avon Gully, MD  pravastatin (PRAVACHOL) 80 MG tablet Take 80 mg by mouth daily.   Yes Historical Provider, MD  traMADol (ULTRAM) 50 MG tablet Take 50 mg by mouth 3 (three) times daily as needed for pain (Takes one tablet every morning and evening.).   Yes Historical Provider, MD     Allergies:  Allergies  Allergen Reactions  . Erythromycin Other (See Comments)    Unknown   . Penicillins Other (See Comments)    Unknown     Social History:   reports that she has never smoked. She does not have any smokeless tobacco history on file. She reports that she does not drink alcohol or use illicit drugs.  Family History: Family History  Problem Relation Age of Onset  . Arthritis    . Asthma    . Diabetes       Physical Exam: Filed Vitals:   02/28/13 0330 02/28/13 0345 02/28/13 0400 02/28/13 0415  BP: 132/51 128/48 134/48 135/51  Pulse: 48 53 52 52  Temp:      TempSrc:      Resp: 18 19 16 16   Height:      Weight:      SpO2: 95% 94% 95% 94%   Blood pressure 135/51, pulse 52, temperature 99 F (37.2 C), temperature source Oral, resp. rate 16, height 5\' 5"  (1.651 m), weight 98.431 kg (217 lb), SpO2 94.00%.  GEN:  Pleasant  patient lying in the stretcher in no acute distress; cooperative with exam. PSYCH:  alert and oriented x4; does not appear anxious or depressed; affect is appropriate. HEENT: Mucous membranes pink and anicteric; PERRLA; EOM intact; no cervical lymphadenopathy nor thyromegaly or carotid bruit; no JVD; There were no stridor. Neck is very supple. Breasts:: Not examined CHEST WALL: No tenderness CHEST: Normal  respiration, clear to auscultation bilaterally.  HEART: Regular rate and rhythm.  There are no murmur, rub, or gallops.   BACK: No kyphosis or scoliosis; no CVA tenderness ABDOMEN: soft and non-tender; no masses, no organomegaly, normal abdominal bowel sounds; no pannus; no intertriginous candida. There is no rebound and no distention. Rectal Exam: Not done EXTREMITIES: No bone or joint deformity; age-appropriate arthropathy of the hands and knees; no edema; no ulcerations.  There is no calf tenderness. Genitalia: not examined PULSES: 2+ and symmetric SKIN: Normal hydration no rash or ulceration CNS: Cranial nerves 2-12 grossly intact no focal lateralizing neurologic deficit.  Speech is fluent; uvula elevated with phonation, facial symmetry and tongue midline. DTR are normal bilaterally, cerebella exam is intact, barbinski is negative and strengths are equaled bilaterally.  No sensory loss.   Labs on Admission:  Basic Metabolic Panel:  Recent Labs Lab 02/27/13 2158 02/28/13 0336  NA 123* 131*  K >7.5* 8.0*  CL 90* 102  CO2 28  --   GLUCOSE 94 71  BUN 59* 66*  CREATININE 2.67* 2.40*  CALCIUM 9.9  --    Liver Function Tests: No results found for this basename: AST, ALT, ALKPHOS, BILITOT, PROT, ALBUMIN,  in the last 168 hours No results found for this basename: LIPASE, AMYLASE,  in the last 168 hours No results found for this basename: AMMONIA,  in the last 168 hours CBC:  Recent Labs Lab 02/27/13 2158 02/28/13 0336  WBC 7.3  --   NEUTROABS 5.0  --   HGB 11.1* 11.9*  HCT 33.0* 35.0*  MCV 79.7  --   PLT 151  --    Cardiac Enzymes:  Recent Labs Lab 02/27/13 2158  TROPONINI <0.30    CBG:  Recent Labs Lab 02/28/13 0131  GLUCAP 57*     Radiological Exams on Admission: Dg Chest Port 1 View  02/27/2013   CLINICAL DATA:  Shortness of breath, fatigue.  EXAM: PORTABLE CHEST - 1 VIEW  COMPARISON:  02/05/2013  FINDINGS: Mild cardiomegaly. Diffuse interstitial  prominence throughout the lungs is stable since prior study, likely mild pulmonary edema. Bibasilar atelectasis and small effusions.  IMPRESSION: Findings similar to prior study with diffuse interstitial prominence, bibasilar atelectasis and small effusions. Suspect mild interstitial edema.   Electronically Signed   By: Charlett Nose M.D.   On: 02/27/2013 22:20    EKG: Independently reviewed. SR, Widen QRS, peaked Ts   Assessment/Plan Present on Admission:  . Acute renal failure (ARF) . Acute hyperkalemia . Acute on chronic diastolic heart failure . DEMENTIA, MILD . DIABETES MELLITUS, TYPE II, WITH NEUROLOGICAL COMPLICATIONS . HYPERLIPIDEMIA  PLAN:  Will admit her for hyperkalemia and ARF.  The causes are decreased GFR because of ARB and direct K supplementation.  Will Tx with IV Dextro and Insulin, along with Calcium and IVF with bicarb.  She was given K exalate.  Will give her another course of the same.  I hope her total body KCL will be eliminated with Kayexalate, but I afraid she would need dialysis.  I have consulted Dr Web who will consult on her.  In the interim, will follow her BMET every 2 hours and admit her to SDU.  Thank you for asking me to participate in her care.  Other plans as per orders.  Code Status: FULL Unk Lightning, MD. [facility] Pager [phone] 7pm to 7am.  02/28/2013, 5:17 AM

## 2013-02-28 NOTE — Care Management Note (Signed)
    Page 1 of 1   02/28/2013     11:19:51 AM   CARE MANAGEMENT NOTE 02/28/2013  Patient:  Brittney Li, Brittney Li   Account Number:  [id number]  Date Initiated:  02/28/2013  Documentation initiated by:  [facility]  Subjective/Objective Assessment:   Reamdmitted with hyperkalemia.     Action/Plan:   Anticipated DC Date:  03/03/2013   Anticipated DC Plan:  LONG TERM ACUTE CARE (LTAC)      DC Planning Services  CM consult      Choice offered to / List presented to:             Status of service:  In process, will continue to follow Medicare Important Message given?   (If response is "NO", the following Medicare IM given date fields will be blank) Date Medicare IM given:   Date Additional Medicare IM given:    Discharge Disposition:    Per UR Regulation:  Reviewed for med. necessity/level of care/duration of stay  If discussed at Long Length of Stay Meetings, dates discussed:    Comments:  ContactLionel December [phone]                 [facility] Son [phone]                 Neighbor,Geraldine Neighbor [phone]  02-28-13 11:15am Avie Arenas, RNBSN - [phone] Lethargic.  Good Ltach candidate - readmission - Spoke with physician Dr. Butler Denmark.

## 2013-02-28 NOTE — Progress Notes (Signed)
[facility] ROUNDING NOTE   Subjective:   Interval History: stable this morning  Objective:  Vital signs in last 24 hours:  Temp:  [98.4 F (36.9 C)-99 F (37.2 C)] 98.8 F (37.1 C) (09/30 0648) Pulse Rate:  [48-102] 75 (09/30 1030) Resp:  [15-24] 18 (09/30 1030) BP: (116-171)/(47-123) 116/51 mmHg (09/30 1030) SpO2:  [68 %-98 %] 94 % (09/30 1030) Weight:  [98.431 kg (217 lb)] 98.431 kg (217 lb) (09/29 2204)  Weight change:  Filed Weights   02/27/13 2204  Weight: 98.431 kg (217 lb)    Intake/Output: I/O last 3 completed shifts: In: 400 [I.V.:400] Out: 425 [Urine:425]   Intake/Output this shift:  Total I/O In: 6 [I.V.:6] Out: 400 [Urine:400]  CVS- RRR RS- CTA ABD- BS present soft non-distended EXT- no edema   Basic Metabolic Panel:  Recent Labs Lab 02/27/13 2158 02/28/13 0336 02/28/13 0550 02/28/13 0945  NA 123* 131* 134* 133*  K >7.5* 8.0* 6.9* 5.8*  CL 90* 102 103 98  CO2 28  --   --  24  GLUCOSE 94 71 50* 78  BUN 59* 66* 59* 54*  CREATININE 2.67* 2.40* 2.40* 2.27*  CALCIUM 9.9  --   --  10.2    Liver Function Tests: No results found for this basename: AST, ALT, ALKPHOS, BILITOT, PROT, ALBUMIN,  in the last 168 hours No results found for this basename: LIPASE, AMYLASE,  in the last 168 hours No results found for this basename: AMMONIA,  in the last 168 hours  CBC:  Recent Labs Lab 02/27/13 2158 02/28/13 0336 02/28/13 0550 02/28/13 0945  WBC 7.3  --   --  8.6  NEUTROABS 5.0  --   --   --   HGB 11.1* 11.9* 12.2 11.8*  HCT 33.0* 35.0* 36.0 33.7*  MCV 79.7  --   --  77.3*  PLT 151  --   --  141*    Cardiac Enzymes:  Recent Labs Lab 02/27/13 2158  TROPONINI <0.30    BNP: No components found with this basename: POCBNP,   CBG:  Recent Labs Lab 02/28/13 0131 02/28/13 0150 02/28/13 0646  GLUCAP 57* 153* 82    Microbiology: Results for orders placed during the hospital encounter of 02/27/13  MRSA PCR SCREENING      Status: None   Collection Time    02/28/13  6:54 AM      Result Value Range Status   MRSA by PCR NEGATIVE  NEGATIVE Final   Comment:            The GeneXpert MRSA Assay (FDA     approved for NASAL specimens     only), is one component of a     comprehensive MRSA colonization     surveillance program. It is not     intended to diagnose MRSA     infection nor to guide or     monitor treatment for     MRSA infections.    Coagulation Studies: No results found for this basename: LABPROT, INR,  in the last 72 hours  Urinalysis:  Recent Labs  02/28/13 0416 02/28/13 0656  COLORURINE YELLOW YELLOW  LABSPEC 1.006 1.008  PHURINE 5.0 5.0  GLUCOSEU NEGATIVE NEGATIVE  HGBUR LARGE* MODERATE*  BILIRUBINUR NEGATIVE NEGATIVE  KETONESUR NEGATIVE NEGATIVE  PROTEINUR 100* 30*  UROBILINOGEN 1.0 1.0  NITRITE NEGATIVE NEGATIVE  LEUKOCYTESUR LARGE* SMALL*      Imaging: Dg Chest Port 1 View  02/27/2013   CLINICAL DATA:  Shortness of breath, fatigue.  EXAM: PORTABLE CHEST - 1 VIEW  COMPARISON:  02/05/2013  FINDINGS: Mild cardiomegaly. Diffuse interstitial prominence throughout the lungs is stable since prior study, likely mild pulmonary edema. Bibasilar atelectasis and small effusions.  IMPRESSION: Findings similar to prior study with diffuse interstitial prominence, bibasilar atelectasis and small effusions. Suspect mild interstitial edema.   Electronically Signed   By: Charlett Nose M.D.   On: 02/27/2013 22:20     Medications:   .  sodium bicarbonate  infusion 1000 mL 75 mL/hr at 02/28/13 0922   . amLODipine  10 mg Oral Daily  . antiseptic oral rinse  15 mL Mouth Rinse q12n4p  . aspirin EC  81 mg Oral Daily  . calcium chloride      . chlorhexidine  15 mL Mouth Rinse BID  . dextrose      . dextrose      . donepezil  10 mg Oral QHS  . famotidine  20 mg Oral Daily  . furosemide  80 mg Intravenous Once  . heparin  5,000 Units Subcutaneous Q8H  . mirtazapine  30 mg Oral QHS  .  mometasone-formoterol  2 puff Inhalation BID  . multivitamin with minerals  1 tablet Oral Daily  . oxybutynin  5 mg Oral Daily  . simvastatin  40 mg Oral QHS  . sodium chloride  3 mL Intravenous Q12H   albuterol, albuterol  Assessment/Plan  Hyperkalemia improving K decreased to 5.8 with conservative therapy  Will continue to follow   LOS: 1 Anand Tejada W @TODAY @10 :59 AM

## 2013-02-28 NOTE — ED Notes (Addendum)
Pt states that she is unable to hold her weight... And cannot stand.

## 2013-02-28 NOTE — ED Notes (Signed)
Potassium results shown to Dr.Opitz

## 2013-02-28 NOTE — ED Notes (Signed)
Pt cleaned, flexiseal in place, full linen and gown change. No changes, pt to floor via stretcher, on monitor and O2, with EMT and RN, O2 Maili increased from 2L to 4L d/t SPO2 drop from pt lying flat and turning to be cleaned, pt remains calm, alert, NAD, interactive, cooperative, participatory, speaking in clear complete sentences, no sx of dyspnea except for low SPO2, improved at 4L, up to 90% SPO2.

## 2013-02-28 NOTE — Consult Note (Signed)
Referring Provider: No ref. provider found Primary Care Physician:  Avon Gully, MD Primary Nephrologist: none  Reason for Consultation:  Hyperkalemia. Acute renal failure HPI: Brittney Li is an 79 y.o. female with hx of CHF, HTN, on Benazepril along with K supplements, DM, COPD, presnets to [facility] ER with weakness. She was found to be in ARF with Cr of 2.5, K greater than 9.0 mEq/L with EKG showed widen QRS complex and symmetrical peak Twaves. She was given Calcium Chloride, Insulin and Dextrose, along with 30 grams of Kayexalate. She was transferred subsequently to [facility] ER for further treatment. A repeat potassium revealed a potassium of 8.0 although at time of evaluation the EKG rhythm strip showed increased heart rate and narrowing of QRS. She also was found to have had a large bowel movement.    Past Medical History  Diagnosis Date  . Asthma   . Bronchitis   . CHF (congestive heart failure)   . COPD (chronic obstructive pulmonary disease)   . Hypertension   . Diabetes mellitus without complication   . Arthritis     Past Surgical History  Procedure Laterality Date  . Hands      Prior to Admission medications   Medication Sig Start Date End Date Taking? Authorizing Provider  albuterol (PROVENTIL HFA;VENTOLIN HFA) 108 (90 BASE) MCG/ACT inhaler Inhale 2 puffs into the lungs every 6 (six) hours as needed for wheezing.   Yes Historical Provider, MD  albuterol (PROVENTIL) (2.5 MG/3ML) 0.083% nebulizer solution Take 2.5 mg by nebulization every 6 (six) hours as needed for wheezing.   Yes Historical Provider, MD  amLODipine-benazepril (LOTREL) 10-20 MG per capsule Take 1 capsule by mouth daily.   Yes Historical Provider, MD  aspirin 81 MG tablet Take 81 mg by mouth daily.     Yes Historical Provider, MD  docusate sodium (COLACE) 100 MG capsule Take 200 mg by mouth at bedtime.   Yes Historical Provider, MD  donepezil (ARICEPT) 10 MG tablet Take 10 mg by mouth at bedtime.   Yes Historical  Provider, MD  famotidine (PEPCID) 20 MG tablet Take 20 mg by mouth daily.   Yes Historical Provider, MD  furosemide (LASIX) 40 MG tablet Take 1 tablet (40 mg total) by mouth daily. 02/06/13  Yes Avon Gully, MD  mirtazapine (REMERON) 30 MG tablet Take 30 mg by mouth at bedtime.   Yes Historical Provider, MD  mometasone-formoterol (DULERA) 200-5 MCG/ACT AERO Inhale 2 puffs into the lungs 2 (two) times daily. 02/06/13  Yes Avon Gully, MD  Multiple Vitamin (MULTIVITAMIN WITH MINERALS) TABS tablet Take 1 tablet by mouth daily.   Yes Historical Provider, MD  oxybutynin (DITROPAN) 5 MG tablet Take 5 mg by mouth daily.   Yes Historical Provider, MD  potassium chloride (K-DUR) 10 MEQ tablet Take 2 tablets (20 mEq total) by mouth 2 (two) times daily. 02/06/13  Yes Avon Gully, MD  pravastatin (PRAVACHOL) 80 MG tablet Take 80 mg by mouth daily.   Yes Historical Provider, MD  traMADol (ULTRAM) 50 MG tablet Take 50 mg by mouth 3 (three) times daily as needed for pain (Takes one tablet every morning and evening.).   Yes Historical Provider, MD    Current Facility-Administered Medications  Medication Dose Route Frequency Provider Last Rate Last Dose  . calcium chloride 10 % injection           . dextrose 5 % solution   Intravenous Once Sunnie Nielsen, MD      . dextrose 50 %  solution           . dextrose 50 % solution            Current Outpatient Prescriptions  Medication Sig Dispense Refill  . albuterol (PROVENTIL HFA;VENTOLIN HFA) 108 (90 BASE) MCG/ACT inhaler Inhale 2 puffs into the lungs every 6 (six) hours as needed for wheezing.      Marland Kitchen albuterol (PROVENTIL) (2.5 MG/3ML) 0.083% nebulizer solution Take 2.5 mg by nebulization every 6 (six) hours as needed for wheezing.      Marland Kitchen amLODipine-benazepril (LOTREL) 10-20 MG per capsule Take 1 capsule by mouth daily.      Marland Kitchen aspirin 81 MG tablet Take 81 mg by mouth daily.        Marland Kitchen docusate sodium (COLACE) 100 MG capsule Take 200 mg by mouth at bedtime.      .  donepezil (ARICEPT) 10 MG tablet Take 10 mg by mouth at bedtime.      . famotidine (PEPCID) 20 MG tablet Take 20 mg by mouth daily.      . furosemide (LASIX) 40 MG tablet Take 1 tablet (40 mg total) by mouth daily.  30 tablet  3  . mirtazapine (REMERON) 30 MG tablet Take 30 mg by mouth at bedtime.      . mometasone-formoterol (DULERA) 200-5 MCG/ACT AERO Inhale 2 puffs into the lungs 2 (two) times daily.  1 Inhaler  3  . Multiple Vitamin (MULTIVITAMIN WITH MINERALS) TABS tablet Take 1 tablet by mouth daily.      Marland Kitchen oxybutynin (DITROPAN) 5 MG tablet Take 5 mg by mouth daily.      . potassium chloride (K-DUR) 10 MEQ tablet Take 2 tablets (20 mEq total) by mouth 2 (two) times daily.  60 tablet  3  . pravastatin (PRAVACHOL) 80 MG tablet Take 80 mg by mouth daily.      . traMADol (ULTRAM) 50 MG tablet Take 50 mg by mouth 3 (three) times daily as needed for pain (Takes one tablet every morning and evening.).        Allergies as of 02/27/2013 - Review Complete 02/27/2013  Allergen Reaction Noted  . Erythromycin Other (See Comments)   . Penicillins Other (See Comments)     Family History  Problem Relation Age of Onset  . Arthritis    . Asthma    . Diabetes      History   Social History  . Marital Status: Divorced    Spouse Name: N/A    Number of Children: N/A  . Years of Education: 12th grade   Occupational History  . retired    Social History Main Topics  . Smoking status: Never Smoker   . Smokeless tobacco: Not on file  . Alcohol Use: No  . Drug Use: No  . Sexual Activity: Not on file   Other Topics Concern  . Not on file   Social History Narrative  . No narrative on file    Review of Systems: Gen: Denies any fever, chills, sweats, anorexia, + fatigue,+weakness, + malaise,  HEENT: No visual complaints, No history of Retinopathy. Normal external appearance No Epistaxis or Sore throat. No sinusitis.   CV: Denies chest pain, angina, palpitations, syncope, orthopnea, PND,  peripheral edema, and claudication. Resp: Denies dyspnea at rest, dyspnea with exercise, cough, sputum, wheezing, coughing up blood, and pleurisy. GI: Denies vomiting blood, jaundice, Denies dysphagia or odynophagia. Loose BM GU : Denies urinary burning, blood in urine, urinary frequency, urinary hesitancy, nocturnal urination, and urinary  incontinence.  No renal calculi. MS: Denies joint pain, limitation of movement, and swelling, stiffness, low back pain, extremity pain. Denies muscle weakness, cramps, atrophy.  No use of non steroidal antiinflammatory drugs. Derm: Denies rash, itching, dry skin, hives, moles, warts, or unhealing ulcers.  Psych: Denies depression, anxiety, memory loss, suicidal ideation, hallucinations, paranoia, and confusion. Heme: Denies bruising, bleeding, and enlarged lymph nodes. Neuro: No headache.  No diplopia. No dysarthria.  No dysphasia.  No history of CVA.  No Seizures. No paresthesias.  No weakness. Endocrine DM.  No Thyroid disease.  No Adrenal disease.  Physical Exam: Vital signs in last 24 hours: Temp:  [98.4 F (36.9 C)-99 F (37.2 C)] 99 F (37.2 C) (09/30 0237) Pulse Rate:  [48-80] 52 (09/30 0415) Resp:  [16-24] 16 (09/30 0415) BP: (128-171)/(48-98) 135/51 mmHg (09/30 0415) SpO2:  [91 %-97 %] 94 % (09/30 0415) Weight:  [98.431 kg (217 lb)] 98.431 kg (217 lb) (09/29 2204)   General:   Elderly lady non distressed Head:  Normocephalic and atraumatic. Eyes:  Sclera clear, no icterus.   Conjunctiva pink. Ears:  Normal auditory acuity. Nose:  No deformity, discharge,  or lesions. Mouth:  No deformity or lesions, dentition normal. Neck:  Supple; no masses or thyromegaly. JVP not elevated Lungs:  Clear throughout to auscultation.   No wheezes, crackles, or rhonchi. No acute distress. Heart:  Regular rate and rhythm; no murmurs, clicks, rubs,  or gallops. Abdomen:  Soft, nontender and nondistended. No masses, hepatosplenomegaly or hernias noted. Normal  bowel sounds, without guarding, and without rebound.  Foley Msk:  Symmetrical without gross deformities. Normal posture. Pulses:  No carotid, renal, femoral bruits. DP and PT symmetrical and equal Extremities:  Without clubbing   2+ edema. Neurologic:  Alert and  oriented x4;  grossly normal neurologically. Skin:  Intact without significant lesions or rashes. Cervical Nodes:  No significant cervical adenopathy. Psych:  Alert and cooperative. Normal mood and affect.  Intake/Output from previous day: 09/29 0701 - 09/30 0700 In: -  Out: 425 [Urine:425] Intake/Output this shift: Total I/O In: -  Out: 425 [Urine:425]  Lab Results:  Recent Labs  02/27/13 2158 02/28/13 0336  WBC 7.3  --   HGB 11.1* 11.9*  HCT 33.0* 35.0*  PLT 151  --    BMET  Recent Labs  02/27/13 2158 02/28/13 0336  NA 123* 131*  K >7.5* 8.0*  CL 90* 102  CO2 28  --   GLUCOSE 94 71  BUN 59* 66*  CREATININE 2.67* 2.40*  CALCIUM 9.9  --    LFT No results found for this basename: PROT, ALBUMIN, AST, ALT, ALKPHOS, BILITOT, BILIDIR, IBILI,  in the last 72 hours PT/INR No results found for this basename: LABPROT, INR,  in the last 72 hours Hepatitis Panel No results found for this basename: HEPBSAG, HCVAB, HEPAIGM, HEPBIGM,  in the last 72 hours  Studies/Results: Dg Chest Port 1 View  02/27/2013   CLINICAL DATA:  Shortness of breath, fatigue.  EXAM: PORTABLE CHEST - 1 VIEW  COMPARISON:  02/05/2013  FINDINGS: Mild cardiomegaly. Diffuse interstitial prominence throughout the lungs is stable since prior study, likely mild pulmonary edema. Bibasilar atelectasis and small effusions.  IMPRESSION: Findings similar to prior study with diffuse interstitial prominence, bibasilar atelectasis and small effusions. Suspect mild interstitial edema.   Electronically Signed   By: Charlett Nose M.D.   On: 02/27/2013 22:20    Assessment/Plan:  Hyperkalemia. I agree with medical management and suggest the addition  of sodium  bicarbonate IV. This in addition to the discontinuation of benazepril and potassium supplements. Hopefully we can avoid dialysis as there appears to be quite a significant improvement in EKG and results of kayexalate.  Acute renal Failure  This appears to be related to ACE inhibitor and volume depletion.  Diastolic dysfunction  Caution with IVF will use only 75cc/hour   LOS: 1 Shadae Reino W @TODAY @5 :34 AM

## 2013-02-28 NOTE — Clinical Social Work Note (Signed)
Clinical Social Work Department BRIEF PSYCHOSOCIAL ASSESSMENT 02/28/2013  Patient:  Brittney Li, Brittney Li     Account Number:  [id number]     Admit date:  02/27/2013  Clinical Social Worker:  Read Drivers  Date/Time:  02/28/2013 11:29 AM  Referred by:  Physician  Date Referred:  02/28/2013 Referred for  ALF Placement   Other Referral:   pt from Assisted Living - [facility] on [address] in [place]   Interview type:  Other - See comment Other interview type:   pt son, Brittney Li    PSYCHOSOCIAL DATA Living Status:  FACILITY Admitted from facility:  [facility] Level of care:  Assisted Living Primary support name:  Brittney Li Primary support relationship to patient:  CHILD, ADULT Degree of support available:   adequate    CURRENT CONCERNS Current Concerns  Post-Acute Placement   Other Concerns:   none    SOCIAL WORK ASSESSMENT / PLAN CSW received a consult that pt was from a facility.  CSW spoke with pt son regarding pt current level of care.  CSW introduced self and CSW role.    Son unsure if the level of care she currently has is adequate.  Son reports that pt is from [facility], Assisted Living on [address] in [place].    CSW reviewed this pt disposition with RNCM who shared that pt may be a great candidate for LTACH.  RNCM reviewed this with MD.  CSW will continue to follow for SNF as possible back up.    If pt is projected to return to ALF or to SNF, pt will need PT/OT orders/evaluation.   Assessment/plan status:  Psychosocial Support/Ongoing Assessment of Needs Other assessment/ plan:   none   Information/referral to community resources:   SNF  ALF  [facility]  RNCM    PATIENT'S/FAMILY'S RESPONSE TO PLAN OF CARE: Pt son expressed appreciation of CSW assistance.       Brittney Li, LCSWA [phone]  Clinical Social Work

## 2013-02-28 NOTE — ED Notes (Signed)
Dr. Hyman Hopes into room.

## 2013-03-01 ENCOUNTER — Encounter (HOSPITAL_COMMUNITY): Payer: Self-pay | Admitting: Anesthesiology

## 2013-03-01 ENCOUNTER — Inpatient Hospital Stay (HOSPITAL_COMMUNITY): Payer: Medicare Other

## 2013-03-01 DIAGNOSIS — I10 Essential (primary) hypertension: Secondary | ICD-10-CM

## 2013-03-01 DIAGNOSIS — I517 Cardiomegaly: Secondary | ICD-10-CM

## 2013-03-01 LAB — GLUCOSE, CAPILLARY
Glucose-Capillary: 106 mg/dL — ABNORMAL HIGH (ref 70–99)
Glucose-Capillary: 109 mg/dL — ABNORMAL HIGH (ref 70–99)
Glucose-Capillary: 104 mg/dL — ABNORMAL HIGH (ref 70–99)

## 2013-03-01 LAB — BASIC METABOLIC PANEL
BUN: 49 mg/dL — ABNORMAL HIGH (ref 6–23)
BUN: 47 mg/dL — ABNORMAL HIGH (ref 6–23)
BUN: 45 mg/dL — ABNORMAL HIGH (ref 6–23)
CO2: 32 mEq/L (ref 19–32)
CO2: 35 mEq/L — ABNORMAL HIGH (ref 19–32)
Chloride: 100 mEq/L (ref 96–112)
Chloride: 98 mEq/L (ref 96–112)
Chloride: 100 mEq/L (ref 96–112)
Creatinine, Ser: 1.85 mg/dL — ABNORMAL HIGH (ref 0.50–1.10)
Creatinine, Ser: 1.8 mg/dL — ABNORMAL HIGH (ref 0.50–1.10)
Creatinine, Ser: 1.64 mg/dL — ABNORMAL HIGH (ref 0.50–1.10)
GFR calc Af Amer: 28 mL/min — ABNORMAL LOW (ref >90)
GFR calc Af Amer: 29 mL/min — ABNORMAL LOW (ref >90)
GFR calc Af Amer: 33 mL/min — ABNORMAL LOW (ref >90)
GFR calc non Af Amer: 24 mL/min — ABNORMAL LOW (ref >90)
GFR calc non Af Amer: 25 mL/min — ABNORMAL LOW (ref >90)
GFR calc non Af Amer: 28 mL/min — ABNORMAL LOW (ref >90)
Glucose, Bld: 111 mg/dL — ABNORMAL HIGH (ref 70–99)
Glucose, Bld: 88 mg/dL (ref 70–99)
Potassium: 4.3 mEq/L (ref 3.5–5.1)

## 2013-03-01 MED ORDER — BIOTENE DRY MOUTH MT LIQD
15.0000 mL | Freq: Two times a day (BID) | OROMUCOSAL | Status: DC
  Administered 2013-03-01 – 2013-03-06 (×9): 15 mL via OROMUCOSAL

## 2013-03-01 MED ORDER — FUROSEMIDE 10 MG/ML IJ SOLN
40.0000 mg | Freq: Two times a day (BID) | INTRAMUSCULAR | Status: DC
  Administered 2013-03-01 (×2): 40 mg via INTRAVENOUS
  Filled 2013-03-01 (×2): qty 4

## 2013-03-01 MED ORDER — PRAVASTATIN SODIUM 40 MG PO TABS
80.0000 mg | ORAL_TABLET | Freq: Every day | ORAL | Status: DC
  Administered 2013-03-01 – 2013-03-05 (×5): 80 mg via ORAL
  Filled 2013-03-01 (×5): qty 2

## 2013-03-01 NOTE — Progress Notes (Signed)
  Echocardiogram 2D Echocardiogram has been performed.  Brittney Li, Brittney Li 03/01/2013, 6:39 PM

## 2013-03-01 NOTE — Progress Notes (Signed)
[facility] ROUNDING NOTE   Subjective:   Interval History: awake alert and eating   Objective:  Vital signs in last 24 hours:  Temp:  [98.4 F (36.9 C)-100.1 F (37.8 C)] 100.1 F (37.8 C) (10/01 0816) Pulse Rate:  [69-94] 87 (10/01 1000) Resp:  [16-25] 23 (10/01 1000) BP: (98-137)/(34-103) 98/79 mmHg (10/01 1000) SpO2:  [89 %-97 %] 95 % (10/01 1000)  Weight change:  Filed Weights   02/27/13 2204  Weight: 98.431 kg (217 lb)    Intake/Output: I/O last 3 completed shifts: In: 1019.2 [P.O.:120; I.V.:899.2] Out: 6650 [Urine:6200; Stool:450]   Intake/Output this shift:  Total I/O In: 15 [I.V.:15] Out: 475 [Urine:475]  CVS- RRR RS- CTA ABD- BS present soft non-distended EXT- no edema   Basic Metabolic Panel:  Recent Labs Lab 02/28/13 1400 02/28/13 1835 03/01/13 0030 03/01/13 0415 03/01/13 0800  NA 136 139 140 139 140  K 5.4* 4.7 4.3 3.9 3.9  CL 98 100 100 98 100  CO2 30 29 32 35* 33*  GLUCOSE 78 84 111* 90 88  BUN 53* 49* 49* 47* 45*  CREATININE 2.16* 1.92* 1.85* 1.80* 1.64*  CALCIUM 10.0 9.2 9.3 9.4 9.2    Liver Function Tests: No results found for this basename: AST, ALT, ALKPHOS, BILITOT, PROT, ALBUMIN,  in the last 168 hours No results found for this basename: LIPASE, AMYLASE,  in the last 168 hours No results found for this basename: AMMONIA,  in the last 168 hours  CBC:  Recent Labs Lab 02/27/13 2158 02/28/13 0336 02/28/13 0550 02/28/13 0945  WBC 7.3  --   --  8.6  NEUTROABS 5.0  --   --   --   HGB 11.1* 11.9* 12.2 11.8*  HCT 33.0* 35.0* 36.0 33.7*  MCV 79.7  --   --  77.3*  PLT 151  --   --  141*    Cardiac Enzymes:  Recent Labs Lab 02/27/13 2158  TROPONINI <0.30    BNP: No components found with this basename: POCBNP,   CBG:  Recent Labs Lab 02/28/13 0646 02/28/13 1132 02/28/13 1405 02/28/13 1609 02/28/13 1944  GLUCAP 82 91 77 85 91    Microbiology: Results for orders placed during the hospital  encounter of 02/27/13  URINE CULTURE     Status: None   Collection Time    02/28/13  4:16 AM      Result Value Range Status   Specimen Description URINE, RANDOM   Final   Special Requests CX ADDED AT 0605 ON [id number]   Final   Culture  Setup Time     Final   Value: 02/28/2013 06:08     Performed at [facility]   Colony Count     Final   Value: >=100,000 COLONIES/ML     Performed at [facility]   Culture     Final   Value: GRAM NEGATIVE RODS     Performed at [facility]   Report Status PENDING   Incomplete  MRSA PCR SCREENING     Status: None   Collection Time    02/28/13  6:54 AM      Result Value Range Status   MRSA by PCR NEGATIVE  NEGATIVE Final   Comment:            The GeneXpert MRSA Assay (FDA     approved for NASAL specimens     only), is one component of a     comprehensive MRSA  colonization     surveillance program. It is not     intended to diagnose MRSA     infection nor to guide or     monitor treatment for     MRSA infections.  URINE CULTURE     Status: None   Collection Time    02/28/13  6:56 AM      Result Value Range Status   Specimen Description URINE, CATHETERIZED   Final   Special Requests NONE   Final   Culture  Setup Time     Final   Value: 02/28/2013 14:06     Performed at [facility]   Colony Count PENDING   Incomplete   Culture     Final   Value: Culture reincubated for better growth     Performed at [facility]   Report Status PENDING   Incomplete    Coagulation Studies: No results found for this basename: LABPROT, INR,  in the last 72 hours  Urinalysis:  Recent Labs  02/28/13 0416 02/28/13 0656  COLORURINE YELLOW YELLOW  LABSPEC 1.006 1.008  PHURINE 5.0 5.0  GLUCOSEU NEGATIVE NEGATIVE  HGBUR LARGE* MODERATE*  BILIRUBINUR NEGATIVE NEGATIVE  KETONESUR NEGATIVE NEGATIVE  PROTEINUR 100* 30*  UROBILINOGEN 1.0 1.0  NITRITE NEGATIVE NEGATIVE  LEUKOCYTESUR LARGE* SMALL*       Imaging: Dg Chest Port 1 View  03/01/2013   CLINICAL DATA:  COPD.  EXAM: PORTABLE CHEST - 1 VIEW  COMPARISON:  02/27/2013  FINDINGS: Cardiomegaly. Diffuse interstitial and alveolar opacities throughout the lungs, likely edema. Bibasilar opacities likely reflect atelectasis. Small bilateral effusions. Findings similar to prior study.  IMPRESSION: Suspect mild edema/CHF. Small effusions and bibasilar atelectasis. No real change.   Electronically Signed   By: Charlett Nose M.D.   On: 03/01/2013 06:58   Dg Chest Port 1 View  02/27/2013   CLINICAL DATA:  Shortness of breath, fatigue.  EXAM: PORTABLE CHEST - 1 VIEW  COMPARISON:  02/05/2013  FINDINGS: Mild cardiomegaly. Diffuse interstitial prominence throughout the lungs is stable since prior study, likely mild pulmonary edema. Bibasilar atelectasis and small effusions.  IMPRESSION: Findings similar to prior study with diffuse interstitial prominence, bibasilar atelectasis and small effusions. Suspect mild interstitial edema.   Electronically Signed   By: Charlett Nose M.D.   On: 02/27/2013 22:20     Medications:     . amLODipine  10 mg Oral Daily  . antiseptic oral rinse  15 mL Mouth Rinse BID  . aspirin EC  81 mg Oral Daily  . donepezil  10 mg Oral QHS  . famotidine  20 mg Oral Daily  . furosemide  40 mg Intravenous Q12H  . heparin  5,000 Units Subcutaneous Q8H  . mirtazapine  30 mg Oral QHS  . mometasone-formoterol  2 puff Inhalation BID  . multivitamin with minerals  1 tablet Oral Daily  . oxybutynin  5 mg Oral Daily  . simvastatin  40 mg Oral QHS  . sodium chloride  3 mL Intravenous Q12H   albuterol, albuterol  Assessment/ Plan:   Acute Hyperkalemic renal failure . Has recovered with Fluid and stopping of medications ( benazepril and potassium)  Will sign off   LOS: 2 Tarini Carrier W @TODAY @11 :24 AM

## 2013-03-01 NOTE — Evaluation (Signed)
Physical Therapy Evaluation Patient Details Name: Brittney Li MRN: [id number] DOB: [birth year] Today's Date: 03/01/2013 Time: 1135-1220 PT Time Calculation (min): 45 min  PT Assessment / Plan / Recommendation History of Present Illness  79 y.o female with PMH of CHF, HTN, on Benazepril along with K supplements, DM, COPD who presented to [facility] ED with hyperkalemia and chf.  Clinical Impression  Pt admitted with above. Pt currently with functional limitations due to the deficits listed below (see PT Problem List).  Pt will benefit from skilled PT to increase their independence and safety with mobility to allow discharge SNF.  Pt was at ALF just prior to admission but needs higher level of care at this time.     PT Assessment  Patient needs continued PT services    Follow Up Recommendations  SNF    Does the patient have the potential to tolerate intense rehabilitation      Barriers to Discharge Decreased caregiver support      Equipment Recommendations  None recommended by PT    Recommendations for Other Services     Frequency Min 3X/week    Precautions / Restrictions Precautions Precautions: Fall   Pertinent Vitals/Pain Mild pain in lt foot.      Mobility  Bed Mobility Bed Mobility: Supine to Sit;Sitting - Scoot to Edge of Bed Supine to Sit: 4: Min assist Sitting - Scoot to Edge of Bed: 4: Min assist Details for Bed Mobility Assistance: Assist to bring trunk up. Transfers Transfers: Sit to Stand;Stand to [employer] Transfers Sit to Stand: 4: Min assist;With upper extremity assist;From bed Stand to Sit: 4: Min assist;With upper extremity assist;With armrests;To chair/3-in-1 Stand Pivot Transfers: 4: Min assist;With armrests Details for Transfer Assistance: Assist for balance. Ambulation/Gait Ambulation/Gait Assistance: 4: Min assist Ambulation Distance (Feet): 3 Feet Assistive device: Rolling walker Ambulation/Gait Assistance Details: Assist for  balance Gait Pattern: Step-through pattern;Decreased step length - right;Decreased step length - left;Wide base of support;Trunk flexed Gait velocity: decr    Exercises     PT Diagnosis: Difficulty walking;Generalized weakness  PT Problem List: Decreased strength;Decreased activity tolerance;Decreased balance;Decreased mobility PT Treatment Interventions: Gait training;DME instruction;Balance training;Functional mobility training;Therapeutic activities;Therapeutic exercise;Patient/family education     PT Goals(Current goals can be found in the care plan section) Acute Rehab PT Goals Patient Stated Goal: Retun home PT Goal Formulation: With patient Time For Goal Achievement: 03/08/13 Potential to Achieve Goals: Good  Visit Information  Last PT Received On: 03/01/13 Assistance Needed: +1 History of Present Illness: 79 y.o female with PMH of CHF, HTN, on Benazepril along with K supplements, DM, COPD who presented to [facility] ED with hyperkalemia and chf.       Prior Functioning  Home Living Home Equipment: Wheelchair - manual;Walker - 2 wheels Prior Function Level of Independence: Needs assistance Gait / Transfers Assistance Needed: Pt was amb with rolling walker modified independent until 1-2 weeks ago. Had recently gone to assisted living. Communication Communication: No difficulties    Cognition  Cognition Arousal/Alertness: Awake/alert Behavior During Therapy: WFL for tasks assessed/performed Overall Cognitive Status: Within Functional Limits for tasks assessed    Extremity/Trunk Assessment Upper Extremity Assessment Upper Extremity Assessment: Defer to OT evaluation Lower Extremity Assessment Lower Extremity Assessment: Generalized weakness   Balance Balance Balance Assessed: Yes Static Sitting Balance Static Sitting - Balance Support: No upper extremity supported Static Sitting - Level of Assistance: 6: Modified independent (Device/Increase time) Static Sitting -  Comment/# of Minutes: Sat EOB x 10 minutes. Static  Standing Balance Static Standing - Balance Support: Bilateral upper extremity supported Static Standing - Level of Assistance: 4: Min assist  End of Session PT - End of Session Activity Tolerance: Patient limited by fatigue Patient left: in chair;with call bell/phone within reach Nurse Communication: Mobility status  GP     [facility] 03/01/2013, 12:40 PM  [employer] PT [phone]

## 2013-03-01 NOTE — Consult Note (Signed)
Advanced Heart Failure Team Consult Note  Referring Physician: Dr. Tyson Alias Primary Physician: Dr. Felecia Shelling  Reason for Consultation: Diastolic HF  HPI:    Brittney Li is an 79 y.o. female with hx of morbid obesity, diastolic HF EF 60-65% (01/2013), HTN, DM, and COPD. She resides in assisted living at [facility] in [place].   She was admitted in early Septemeber with SOB and was found to have diastolic HF, with grade II diastolic dysfunction. Pro-BNP 4720 and was treated with IV lasix. Discharge weight 220 lbs  She presented to [facility] ER with weakness and was found to be in ARF with Cr of 2.5, K greater than 9.0 mEq/L. EKG showed widen QRS complex and symmetrical peak Twaves. She was treated with Calcium Chloride, Insulin and Dextrose, along with 30 grams of Kayexalate and transferred to [facility]. Her Benazepril was stopped and she received fluids. Nephrology was consulted and have been following. She is diuresing well and her Cr is now improved and is 1.64.  Brittney Li reports that while she was at the nursing home that they were not weighing her or providing PT. She was concerned about how much edema she was having and SOB that she signed herself out and call 911. She does not have any family here just has friends in [place] and reports she will not go back to that nursing home.   Denies SOB or CP. 24 hr I/O -5.6 liters.   Review of Systems: [y] = yes, [ ]  = no   General: Weight gain [ ] ; Weight loss [ ] ; Anorexia [ ] ; Fatigue [Y ]; Fever [ ] ; Chills [ ] ; Weakness [Y ]  Cardiac: Chest pain/pressure [ ] ; Resting SOB [ ] ; Exertional SOB [ ] ; Orthopnea [ Y]; Pedal Edema [ Y]; Palpitations Klaus.Mock ]; Syncope [ ] ; Presyncope [ ] ; Paroxysmal nocturnal dyspnea[ ]   Pulmonary: Cough [ ] ; Wheezing[ ] ; Hemoptysis[ ] ; Sputum [ ] ; Snoring [ ]   GI: Vomiting[ ] ; Dysphagia[ ] ; Melena[ ] ; Hematochezia [ ] ; Heartburn[ ] ; Abdominal pain [ ] ; Constipation [ ] ; Diarrhea [ ] ; BRBPR [ ]   GU: Hematuria[ ] ; Dysuria [  ]; Nocturia[ ]   Vascular: Pain in legs with walking [ ] ; Pain in feet with lying flat [ ] ; Non-healing sores [ ] ; Stroke [ ] ; TIA [ ] ; Slurred speech [ ] ;  Neuro: Headaches[ ] ; Vertigo[ ] ; Seizures[ ] ; Paresthesias[ ] ;Blurred vision [ ] ; Diplopia [ ] ; Vision changes [ ]   Ortho/Skin: Arthritis [ ] ; Joint pain [ ] ; Muscle pain [ ] ; Joint swelling [ ] ; Back Pain [ ] ; Rash [ ]   Psych: Depression[ ] ; Anxiety[ ]   Heme: Bleeding problems [ ] ; Clotting disorders [ ] ; Anemia [ ]   Endocrine: Diabetes [ ] ; Thyroid dysfunction[ ]   Home Medications Prior to Admission medications   Medication Sig Start Date End Date Taking? Authorizing Provider  albuterol (PROVENTIL HFA;VENTOLIN HFA) 108 (90 BASE) MCG/ACT inhaler Inhale 2 puffs into the lungs every 6 (six) hours as needed for wheezing.   Yes Historical Provider, MD  albuterol (PROVENTIL) (2.5 MG/3ML) 0.083% nebulizer solution Take 2.5 mg by nebulization every 6 (six) hours as needed for wheezing.   Yes Historical Provider, MD  amLODipine-benazepril (LOTREL) 10-20 MG per capsule Take 1 capsule by mouth daily.   Yes Historical Provider, MD  aspirin 81 MG tablet Take 81 mg by mouth daily.     Yes Historical Provider, MD  docusate sodium (COLACE) 100 MG capsule Take 200 mg by mouth at bedtime.  Yes Historical Provider, MD  donepezil (ARICEPT) 10 MG tablet Take 10 mg by mouth at bedtime.   Yes Historical Provider, MD  famotidine (PEPCID) 20 MG tablet Take 20 mg by mouth daily.   Yes Historical Provider, MD  furosemide (LASIX) 40 MG tablet Take 1 tablet (40 mg total) by mouth daily. 02/06/13  Yes Avon Gully, MD  mirtazapine (REMERON) 30 MG tablet Take 30 mg by mouth at bedtime.   Yes Historical Provider, MD  mometasone-formoterol (DULERA) 200-5 MCG/ACT AERO Inhale 2 puffs into the lungs 2 (two) times daily. 02/06/13  Yes Avon Gully, MD  Multiple Vitamin (MULTIVITAMIN WITH MINERALS) TABS tablet Take 1 tablet by mouth daily.   Yes Historical Provider, MD   oxybutynin (DITROPAN) 5 MG tablet Take 5 mg by mouth daily.   Yes Historical Provider, MD  potassium chloride (K-DUR) 10 MEQ tablet Take 2 tablets (20 mEq total) by mouth 2 (two) times daily. 02/06/13  Yes Avon Gully, MD  pravastatin (PRAVACHOL) 80 MG tablet Take 80 mg by mouth daily.   Yes Historical Provider, MD  traMADol (ULTRAM) 50 MG tablet Take 50 mg by mouth 3 (three) times daily as needed for pain (Takes one tablet every morning and evening.).   Yes Historical Provider, MD    Past Medical History: Past Medical History  Diagnosis Date  . Asthma   . Bronchitis   . CHF (congestive heart failure)     a. ECHO (01/2013) EF 60-65%, grade II diastolic dysfx, LA mildly dilated  . COPD (chronic obstructive pulmonary disease)   . Hypertension   . Diabetes mellitus without complication   . Arthritis     Past Surgical History: Past Surgical History  Procedure Laterality Date  . Hands    . Abdominal hysterectomy  1975    Family History: Family History  Problem Relation Age of Onset  . Arthritis    . Asthma    . Diabetes      Social History: History   Social History  . Marital Status: Divorced    Spouse Name: N/A    Number of Children: N/A  . Years of Education: 12th grade   Occupational History  . retired    Social History Main Topics  . Smoking status: Former Games developer  . Smokeless tobacco: Never Used     Comment: quit smoking 1990  . Alcohol Use: No  . Drug Use: No  . Sexual Activity: None   Other Topics Concern  . None   Social History Narrative  . None    Allergies:  Allergies  Allergen Reactions  . Erythromycin Other (See Comments)    Unknown   . Penicillins Other (See Comments)    Unknown     Objective:    Vital Signs:   Temp:  [98.1 F (36.7 C)-100.1 F (37.8 C)] 98.1 F (36.7 C) (10/01 1400) Pulse Rate:  [45-94] 45 (10/01 1400) Resp:  [16-25] 22 (10/01 1400) BP: (98-137)/(43-103) 123/52 mmHg (10/01 1400) SpO2:  [89 %-97 %] 93 % (10/01  1400) Last BM Date: 03/01/13  Weight change: Filed Weights   02/27/13 2204  Weight: 98.431 kg (217 lb)    Intake/Output:   Intake/Output Summary (Last 24 hours) at 03/01/13 1837 Last data filed at 03/01/13 1700  Gross per 24 hour  Intake    875 ml  Output   3575 ml  Net  -2700 ml     Physical Exam: General:  Chronically ill appearing. No resp difficulty HEENT: normal Neck: supple.  JVP to ear . Carotids 2+ bilat; no bruits. No lymphadenopathy or thryomegaly appreciated. Cor: PMI nondisplaced. Regular rate & rhythm. No rubs, gallops or murmurs. Lungs: clear Abdomen: obese, soft, nontender, nondistended. No hepatosplenomegaly. No bruits or masses. Good bowel sounds. Extremities: no cyanosis, clubbing, rash, 2+ bilateral edema Neuro: alert & orientedx3, cranial nerves grossly intact. moves all 4 extremities w/o difficulty. Affect pleasant  Telemetry: SR 70s  Labs: Basic Metabolic Panel:  Recent Labs Lab 02/28/13 1400 02/28/13 1835 03/01/13 0030 03/01/13 0415 03/01/13 0800  NA 136 139 140 139 140  K 5.4* 4.7 4.3 3.9 3.9  CL 98 100 100 98 100  CO2 30 29 32 35* 33*  GLUCOSE 78 84 111* 90 88  BUN 53* 49* 49* 47* 45*  CREATININE 2.16* 1.92* 1.85* 1.80* 1.64*  CALCIUM 10.0 9.2 9.3 9.4 9.2    Liver Function Tests: No results found for this basename: AST, ALT, ALKPHOS, BILITOT, PROT, ALBUMIN,  in the last 168 hours No results found for this basename: LIPASE, AMYLASE,  in the last 168 hours No results found for this basename: AMMONIA,  in the last 168 hours  CBC:  Recent Labs Lab 02/27/13 2158 02/28/13 0336 02/28/13 0550 02/28/13 0945  WBC 7.3  --   --  8.6  NEUTROABS 5.0  --   --   --   HGB 11.1* 11.9* 12.2 11.8*  HCT 33.0* 35.0* 36.0 33.7*  MCV 79.7  --   --  77.3*  PLT 151  --   --  141*    Cardiac Enzymes:  Recent Labs Lab 02/27/13 2158  TROPONINI <0.30    BNP: BNP (last 3 results)  Recent Labs  01/31/13 1230 02/04/13 0627 02/27/13 2158   PROBNP 4720.0* 2586.0* 4992.0*    CBG:  Recent Labs Lab 02/28/13 1405 02/28/13 1609 02/28/13 1944 03/01/13 1114 03/01/13 1616  GLUCAP 77 85 91 106* 109*    Coagulation Studies: No results found for this basename: LABPROT, INR,  in the last 72 hours  Other results: EKG: NSR 74. RBBB and LAFB No ST-T wave abnormalities.    Imaging: Dg Chest Port 1 View  03/01/2013   CLINICAL DATA:  COPD.  EXAM: PORTABLE CHEST - 1 VIEW  COMPARISON:  02/27/2013  FINDINGS: Cardiomegaly. Diffuse interstitial and alveolar opacities throughout the lungs, likely edema. Bibasilar opacities likely reflect atelectasis. Small bilateral effusions. Findings similar to prior study.  IMPRESSION: Suspect mild edema/CHF. Small effusions and bibasilar atelectasis. No real change.   Electronically Signed   By: Charlett Nose M.D.   On: 03/01/2013 06:58   Dg Chest Port 1 View  02/27/2013   CLINICAL DATA:  Shortness of breath, fatigue.  EXAM: PORTABLE CHEST - 1 VIEW  COMPARISON:  02/05/2013  FINDINGS: Mild cardiomegaly. Diffuse interstitial prominence throughout the lungs is stable since prior study, likely mild pulmonary edema. Bibasilar atelectasis and small effusions.  IMPRESSION: Findings similar to prior study with diffuse interstitial prominence, bibasilar atelectasis and small effusions. Suspect mild interstitial edema.   Electronically Signed   By: Charlett Nose M.D.   On: 02/27/2013 22:20     Medications:     Current Medications: . amLODipine  10 mg Oral Daily  . antiseptic oral rinse  15 mL Mouth Rinse BID  . aspirin EC  81 mg Oral Daily  . donepezil  10 mg Oral QHS  . famotidine  20 mg Oral Daily  . furosemide  40 mg Intravenous Q12H  . heparin  5,000 Units Subcutaneous  Q8H  . mirtazapine  30 mg Oral QHS  . mometasone-formoterol  2 puff Inhalation BID  . multivitamin with minerals  1 tablet Oral Daily  . oxybutynin  5 mg Oral Daily  . pravastatin  80 mg Oral q1800  . sodium chloride  3 mL  Intravenous Q12H    Infusions:     Assessment:   1) Acute renal failure, baseline 0.9 2) Acute on chronic diastolic HF 3) Cardio renal syndrome 4) HTN 5) DM 6) COPD  Plan/Discussion:    Brittney Li has severe diastolic HF and is still markedly volume overloaded. She was admitted with cardiorenal syndrome which may have been due to hypoperfusion. Renal function now improving. Will continue IV lasix at this time and continue to watch Cr closely. Will place TED hose. Will not restart ACE-I.  Will need renal artery u/s to exclude RAS.  Will get daily weights, consult PT/OT and CR. Will likely need f/u in HF Clinic after d/c.   Case management following, need to decide where patient is going to go after being discharged since she does not want to return to the current nursing home.    Length of Stay: 2  Holly Bodily 03/01/2013, 6:37 PM  Advanced Heart Failure Team Pager [phone] (M-F; 7a - 4p)  Please contact [facility] for night-coverage after hours (4p -7a ) and weekends on [email or web address]

## 2013-03-01 NOTE — Consult Note (Signed)
PULMONARY  / CRITICAL CARE MEDICINE  Name: Brittney Li MRN: [id number] DOB: [birth year]    ADMISSION DATE:  02/27/2013 CONSULTATION DATE:  03/01/2013  PRIMARY SERVICE: TRH  CHIEF COMPLAINT:  No complaints  BRIEF PATIENT DESCRIPTION: 79 y.o female with PMH of CHF, HTN, on Benazepril along with K supplements, DM, COPD who presented to [facility] ED with hyperkalemia and chf.   SIGNIFICANT EVENTS / STUDIES:  9/29 Admitted to [facility] for hyperkalemia and ARF 9/30 continue monitoring hyperkalemia and ARF by PCCM 9/30 Nephro consulted  LINES / TUBES: Peripheral IVs Foley Cath 9/30>>> Rectal Tube 9/30>>>  CULTURES: MRSA PCR>>> Neg Urine Culture 9/30 >>>  ANTIBIOTICS: None  SUBJECTIVE: Patient is resting comfortably but responds appropriately to commands. On Nasal Cannula at 4 L. No complaints at this time. Gross neg balance  VITAL SIGNS: Temp:  [98.4 F (36.9 C)-99.2 F (37.3 C)] 99.2 F (37.3 C) (10/01 0400) Pulse Rate:  [69-102] 85 (10/01 0600) Resp:  [15-25] 19 (10/01 0600) BP: (102-162)/(34-123) 122/47 mmHg (10/01 0600) SpO2:  [69 %-99 %] 95 % (10/01 0600)    INTAKE / OUTPUT: Intake/Output     09/30 0701 - 10/01 0700   P.O. 120   I.V. (mL/kg) 479.2 (4.9)   Total Intake(mL/kg) 599.2 (6.1)   Urine (mL/kg/hr) 5425 (2.3)   Stool 400 (0.2)   Total Output 5825   Net -5225.8         PHYSICAL EXAMINATION: General:  Elderly female, NAD, resting comfortably Neuro: A & O,  responds appropriately,  MAEs HEENT: NCAT, PERRL, EOMI, nasal cannula in place Cardiovascular:  RRR, no m/r/g Lungs:  Clear to auscultation bilaterally, normal respirations with out accessory muscle use Abdomen:  Soft, non tender, non distended, + BS Extremities:  1+ edema bilaterally, skin warm and dry   LABS:  CBC Recent Labs     02/27/13  2158  02/28/13  0336  02/28/13  0550  02/28/13  0945  WBC  7.3   --    --   8.6  HGB  11.1*  11.9*  12.2  11.8*  HCT  33.0*  35.0*  36.0  33.7*  PLT   151   --    --   141*   Coag's No results found for this basename: APTT, INR,  in the last 72 hours BMET Recent Labs     02/28/13  1835  03/01/13  0030  03/01/13  0415  NA  139  140  139  K  4.7  4.3  3.9  CL  100  100  98  CO2  29  32  35*  BUN  49*  49*  47*  CREATININE  1.92*  1.85*  1.80*  GLUCOSE  84  111*  90   Electrolytes Recent Labs     02/28/13  1835  03/01/13  0030  03/01/13  0415  CALCIUM  9.2  9.3  9.4   Cardiac Enzymes Recent Labs     02/27/13  2158  TROPONINI  <0.30  PROBNP  4992.0*   Glucose Recent Labs     02/28/13  0150  02/28/13  0646  02/28/13  1132  02/28/13  1405  02/28/13  1609  02/28/13  1944  GLUCAP  153*  82  91  77  85  91    CXR 10/1: bilateral interstitial opacities, bibasilar atelectasis most likely interstitial edema secondary to CHF. Improved from 9/29 film aeration bases, underpenetrated  ASSESSMENT / PLAN:  PULMONARY  A: COPD Hx Asthma Hx Bronchitis P:   - Continue Dulera  -Continue Lasix, see renal -repeat CXR to re eval edema  And further need neg balance - Continue Merkel at 4L as needed -mobilize  CARDIOVASCULAR A:  Elevated BNP (4992) CHF hypertension P:  -Repeat BNP / pcxr -continue Amlodipine - continue ASA -Waiting on Cardio consult, they have been called-- diastolic dz? or new reduction in lv fxn or valvular dz -Echo awaited, repeat -tele  RENAL A:   Acute renal failure- secondary to K supplementation and Benezapril use?, cardiac failure Hyponatremia resolved Hyperkalemia- Improved with conservative therapy now 3.9 Hyperlipidemia P:   -IV fluids -D/c bicarb  - d/c Kayexalate - Continue Simvastatin  -continue Oxybutinin  - monitor BMET in am -would like to have foley dc -consider lasix reduction  GASTROINTESTINAL A:   Consider starting modified diet- Carb Modified P:   -Continue Pepcid - Continue MVI -diet tolerated  HEMATOLOGIC A:   DVT prevention P:  - Continue Heparin per DVT  prophylaxis  Limit phlebotomy  INFECTIOUS A:   No acute issues P:   - monitor CBC and fevers  ENDOCRINE A:   Type II DM Hypoglycemia resolved after diet started P:   - Monitor CBGs - SSI to ac hs - dc Dextrose  NEUROLOGIC A:  Mild dementia P:   - supportive care -continue Remeron -PT consult  TODAY'S SUMMARY: Patient resting comfortably in NAD. Consider movement out of ICU now that K+ WNL and stabilzed. CHF consult today, called, echo repeat needed.  Nathanial Rancher PA- Student Florence Canner  I have personally obtained a history, examined the patient, evaluated laboratory and imaging results, formulated the assessment and plan and placed orders.  Mcarthur Rossetti. Tyson Alias, MD, FACP Pgr: [phone] [facility]  Pulmonary and Critical Care Medicine [facility] Pager: [phone]  03/01/2013, 6:38 AM

## 2013-03-01 NOTE — Progress Notes (Signed)
[facility]  Patient Status: Active (receiving services up to time of hospitalization)  [facility] is providing the following services: RN and PT Referred for services but admitted to Hospital prior to start of services.  If patient discharges after hours, please call [phone].   Brittney Li 03/01/2013, 4:11 PM

## 2013-03-01 NOTE — Progress Notes (Signed)
Patient has arrived to unit, report received from nurse on [facility], patient is stable; will continue to monitor patient. Lorretta Harp RN

## 2013-03-02 DIAGNOSIS — I1310 Hypertensive heart and chronic kidney disease without heart failure, with stage 1 through stage 4 chronic kidney disease, or unspecified chronic kidney disease: Secondary | ICD-10-CM | POA: Diagnosis present

## 2013-03-02 DIAGNOSIS — N179 Acute kidney failure, unspecified: Secondary | ICD-10-CM

## 2013-03-02 DIAGNOSIS — N19 Unspecified kidney failure: Secondary | ICD-10-CM

## 2013-03-02 DIAGNOSIS — J811 Chronic pulmonary edema: Secondary | ICD-10-CM

## 2013-03-02 LAB — URINE CULTURE
Colony Count: >=100000
Colony Count: >=100000

## 2013-03-02 LAB — GAMMA GT: GGT: 37 U/L (ref 7–51)

## 2013-03-02 LAB — GLUCOSE, CAPILLARY
Glucose-Capillary: 88 mg/dL (ref 70–99)
Glucose-Capillary: 109 mg/dL — ABNORMAL HIGH (ref 70–99)
Glucose-Capillary: 114 mg/dL — ABNORMAL HIGH (ref 70–99)

## 2013-03-02 LAB — BASIC METABOLIC PANEL
BUN: 40 mg/dL — ABNORMAL HIGH (ref 6–23)
Chloride: 97 mEq/L (ref 96–112)
GFR calc Af Amer: 43 mL/min — ABNORMAL LOW (ref >90)
GFR calc non Af Amer: 37 mL/min — ABNORMAL LOW (ref >90)
Potassium: 3.4 mEq/L — ABNORMAL LOW (ref 3.5–5.1)
Sodium: 141 mEq/L (ref 135–145)

## 2013-03-02 MED ORDER — POTASSIUM CHLORIDE CRYS ER 20 MEQ PO TBCR
20.0000 meq | EXTENDED_RELEASE_TABLET | Freq: Two times a day (BID) | ORAL | Status: DC
  Administered 2013-03-02 – 2013-03-06 (×9): 20 meq via ORAL
  Filled 2013-03-02 (×9): qty 1

## 2013-03-02 MED ORDER — FUROSEMIDE 10 MG/ML IJ SOLN: 60.0000 mg | Freq: Two times a day (BID) | INTRAMUSCULAR | Status: DC

## 2013-03-02 NOTE — Progress Notes (Signed)
Clinical Social Work Department CLINICAL SOCIAL WORK PLACEMENT NOTE 03/02/2013  Patient:  Brittney Li, Brittney Li  Account Number:  [id number] Admit date:  02/27/2013  Clinical Social Worker:  Sharol Harness, Theresia Majors  Date/time:  03/02/2013 04:00 PM  Clinical Social Work is seeking post-discharge placement for this patient at the following level of care:   SKILLED NURSING   (*CSW will update this form in Epic as items are completed)   03/02/2013  Patient/family provided with [facility] Department of Clinical Social Work's list of facilities offering this level of care within the geographic area requested by the patient (or if unable, by the patient's family).  03/02/2013  Patient/family informed of their freedom to choose among providers that offer the needed level of care, that participate in Medicare, Medicaid or managed care program needed by the patient, have an available bed and are willing to accept the patient.  03/02/2013  Patient/family informed of [facility]' ownership interest in [facility], as well as of the fact that they are under no obligation to receive care at this facility.  PASARR submitted to EDS on 03/02/2013 PASARR number received from EDS on 03/02/2013  FL2 transmitted to all facilities in geographic area requested by pt/family on  03/02/2013 FL2 transmitted to all facilities within larger geographic area on   Patient informed that his/her managed care company has contracts with or will negotiate with  certain facilities, including the following:     Patient/family informed of bed offers received:   Patient chooses bed at  Physician recommends and patient chooses bed at    Patient to be transferred to  on   Patient to be transferred to facility by   The following physician request were entered in Epic:   Additional CommentsSharol Harness, LCSWA [phone]

## 2013-03-02 NOTE — Evaluation (Signed)
Clinical/Bedside Swallow Evaluation Patient Details  Name: LAURAL EILAND MRN: [id number] Date of Birth: 08-08-1929  Today's Date: 03/02/2013 Time: 1040-1100 SLP Time Calculation (min): 20 min  Past Medical History:  Past Medical History  Diagnosis Date  . Asthma   . Bronchitis   . CHF (congestive heart failure)     a. ECHO (01/2013) EF 60-65%, grade II diastolic dysfx, LA mildly dilated  . COPD (chronic obstructive pulmonary disease)   . Hypertension   . Diabetes mellitus without complication   . Arthritis    Past Surgical History:  Past Surgical History  Procedure Laterality Date  . Hands    . Abdominal hysterectomy  1975   HPI:  79 y.o. female with hx of CHF, HTN, DM, COPD, CVA presented to [facility] ER with weakness. She was found to be in ARF. She was given Calcium Chloride, Insulin and Dextrose, along with 30 grams of Kayexalate. She was transferred subsequently to [facility] ER for further treatment. Diagnosed with acute renal failure and acute respiratory failure with hypoxia/pulmonary edema.    Assessment / Plan / Recommendation Clinical Impression  Patient presents with a functional oropharyngeal swallow with intact mastication of regular texture solids despite missing upper dentition. No overt indication of aspiration observed. No SLP f/u indicated at this time. Signing off.     Aspiration Risk  Mild    Diet Recommendation Regular;Thin liquid   Liquid Administration via: Cup;Straw Medication Administration: Whole meds with liquid Supervision: Patient able to self feed Compensations: Slow rate;Small sips/bites Postural Changes and/or Swallow Maneuvers: Seated upright 90 degrees    Other  Recommendations Oral Care Recommendations: Oral care BID   Follow Up Recommendations  None    Frequency and Duration        Pertinent Vitals/Pain none        Swallow Study    General HPI: 79 y.o. female with hx of CHF, HTN, DM, COPD, CVA presented to [facility] ER with weakness. She was  found to be in ARF. She was given Calcium Chloride, Insulin and Dextrose, along with 30 grams of Kayexalate. She was transferred subsequently to [facility] ER for further treatment. Diagnosed with acute renal failure and acute respiratory failure with hypoxia/pulmonary edema.  Type of Study: Bedside swallow evaluation Previous Swallow Assessment: none noted Diet Prior to this Study: Regular;Thin liquids Temperature Spikes Noted: No Respiratory Status: Supplemental O2 delivered via (comment) (nasal cannula) History of Recent Intubation: No Behavior/Cognition: Alert;Cooperative;Pleasant mood Oral Cavity - Dentition: Missing dentition (missing upper) Self-Feeding Abilities: Able to feed self Patient Positioning: Upright in bed Baseline Vocal Quality: Clear Volitional Cough: Strong Volitional Swallow: Able to elicit    Oral/Motor/Sensory Function Overall Oral Motor/Sensory Function: Appears within functional limits for tasks assessed   Ice Chips Ice chips: Not tested   Thin Liquid Thin Liquid: Within functional limits    Nectar Thick Nectar Thick Liquid: Not tested   Honey Thick Honey Thick Liquid: Not tested   Puree Puree: Within functional limits   Solid   GO   Maribeth Jiles MA, CCC-SLP [phone]  Solid: Within functional limits       Angela Platner Meryl 03/02/2013,12:54 PM

## 2013-03-02 NOTE — Progress Notes (Signed)
Patient has returned to unit; will continue to monitor. Lorretta Harp RN

## 2013-03-02 NOTE — Progress Notes (Addendum)
Bilateral renal artery duplex completed.  No evidence of significant renal artery stenosis.  Abnormal intrarenal resistive indices.  A cyst is noted on the left kidney.

## 2013-03-02 NOTE — Progress Notes (Signed)
OT Cancellation Note  Patient Details Name: META KROENKE MRN: [id number] DOB: 13-Jun-1929   Cancelled Treatment:    Reason Eval/Treat Not Completed: Other (comment) PT to D/C to SNF. Will defer OT to SNF. Signing off. [facility] Graeden Bitner, OTR/L  [phone] 03/02/2013 03/02/2013, 5:32 PM

## 2013-03-02 NOTE — Progress Notes (Signed)
CSW Proofreader) spoke with Brittney Li about Brittney Li recommendation for SNF. Brittney Li is agreeable and would like to stay in [place] if possible. Brittney Li first choice is for [facility] and second choice is [facility]. CSW explained SNF search process to Brittney Li. Brittney Li was understanding and agreeable to referral being sent out to all of Olen Cordial, LCSWA [phone]

## 2013-03-02 NOTE — Progress Notes (Signed)
Patient report received and assessment has been completed, patient is alert and oriented but very sleepy, will continue to monitor patient. D.Bellatrix Devonshire RN

## 2013-03-02 NOTE — Progress Notes (Signed)
Advanced Heart Failure Rounding Note  Referring Physician: Dr. Tyson Alias  Primary Physician: Dr. Felecia Shelling  Reason for Consultation: Diastolic HF  HPI:    Brittney Li is an 79 y.o. female with hx of morbid obesity, diastolic HF EF 60-65% (01/2013), HTN, DM, and COPD. She resides in assisted living at [facility] in [place].   She was admitted in early Septemeber with SOB and was found to have diastolic HF, with grade II diastolic dysfunction. Pro-BNP 4720 and was treated with IV lasix. Discharge weight 220 lbs   She presented to [facility] ER with weakness and was found to be in ARF with Cr of 2.5, K greater than 9.0 mEq/L. EKG showed widen QRS complex and symmetrical peak Twaves. She was treated with Calcium Chloride, Insulin and Dextrose, along with 30 grams of Kayexalate and transferred to [facility]. Her Benazepril was stopped and she received fluids. Nephrology was consulted and have been following.  Transferred to telemetry yesterday. Stable overnight. Cr continues to improve 1.31. Weights probably not accurate along with 24 hr I/O. Denies SOB, orthopnea or CP. TED hose placed.   Objective:   Weight Range:  Vital Signs:   Temp:  [98.1 F (36.7 C)-99.1 F (37.3 C)] 98.7 F (37.1 C) (10/02 0954) Pulse Rate:  [45-83] 68 (10/02 0954) Resp:  [16-22] 16 (10/02 0954) BP: (123-142)/(47-58) 137/53 mmHg (10/02 0954) SpO2:  [92 %-94 %] 92 % (10/02 0954) Weight:  [102.8 kg (226 lb 10.1 oz)-103.2 kg (227 lb 8.2 oz)] 103.2 kg (227 lb 8.2 oz) (10/02 0456) Last BM Date: 03/01/13  Weight change: Filed Weights   02/27/13 2204 03/01/13 1100 03/02/13 0456  Weight: 98.431 kg (217 lb) 102.8 kg (226 lb 10.1 oz) 103.2 kg (227 lb 8.2 oz)    Intake/Output:   Intake/Output Summary (Last 24 hours) at 03/02/13 1046 Last data filed at 03/02/13 0957  Gross per 24 hour  Intake    483 ml  Output    900 ml  Net   -417 ml     Physical Exam:  General: Chronically ill appearing. No resp difficulty  HEENT: normal   Neck: supple. JVP 10 cm. Carotids 2+ bilat; no bruits. No lymphadenopathy or thryomegaly appreciated.  Cor: PMI nondisplaced. Regular rate & rhythm. No rubs, gallops or murmurs.  Lungs: clear  Abdomen: obese, soft, nontender, nondistended. No hepatosplenomegaly. No bruits or masses. Good bowel sounds.  Extremities: no cyanosis, clubbing, rash, 2+ bilateral edema  Neuro: alert & orientedx3, cranial nerves grossly intact. moves all 4 extremities w/o difficulty. Affect pleasant  Telemetry: SR 70s   Labs: Basic Metabolic Panel:  Recent Labs Lab 02/28/13 1835 03/01/13 0030 03/01/13 0415 03/01/13 0800 03/02/13 0450  NA 139 140 139 140 141  K 4.7 4.3 3.9 3.9 3.4*  CL 100 100 98 100 97  CO2 29 32 35* 33* 33*  GLUCOSE 84 111* 90 88 90  BUN 49* 49* 47* 45* 40*  CREATININE 1.92* 1.85* 1.80* 1.64* 1.31*  CALCIUM 9.2 9.3 9.4 9.2 9.1    Liver Function Tests: No results found for this basename: AST, ALT, ALKPHOS, BILITOT, PROT, ALBUMIN,  in the last 168 hours No results found for this basename: LIPASE, AMYLASE,  in the last 168 hours No results found for this basename: AMMONIA,  in the last 168 hours  CBC:  Recent Labs Lab 02/27/13 2158 02/28/13 0336 02/28/13 0550 02/28/13 0945  WBC 7.3  --   --  8.6  NEUTROABS 5.0  --   --   --  HGB 11.1* 11.9* 12.2 11.8*  HCT 33.0* 35.0* 36.0 33.7*  MCV 79.7  --   --  77.3*  PLT 151  --   --  141*    Cardiac Enzymes:  Recent Labs Lab 02/27/13 2158  TROPONINI <0.30    BNP: BNP (last 3 results)  Recent Labs  01/31/13 1230 02/04/13 0627 02/27/13 2158  PROBNP 4720.0* 2586.0* 4992.0*     Other results:  EKG:   Imaging: Dg Chest Port 1 View  03/01/2013   CLINICAL DATA:  COPD.  EXAM: PORTABLE CHEST - 1 VIEW  COMPARISON:  02/27/2013  FINDINGS: Cardiomegaly. Diffuse interstitial and alveolar opacities throughout the lungs, likely edema. Bibasilar opacities likely reflect atelectasis. Small bilateral effusions. Findings similar  to prior study.  IMPRESSION: Suspect mild edema/CHF. Small effusions and bibasilar atelectasis. No real change.   Electronically Signed   By: Charlett Nose M.D.   On: 03/01/2013 06:58     Medications:     Scheduled Medications: . amLODipine  10 mg Oral Daily  . antiseptic oral rinse  15 mL Mouth Rinse BID  . aspirin EC  81 mg Oral Daily  . donepezil  10 mg Oral QHS  . famotidine  20 mg Oral Daily  . heparin  5,000 Units Subcutaneous Q8H  . mirtazapine  30 mg Oral QHS  . mometasone-formoterol  2 puff Inhalation BID  . multivitamin with minerals  1 tablet Oral Daily  . oxybutynin  5 mg Oral Daily  . potassium chloride  20 mEq Oral BID  . pravastatin  80 mg Oral q1800  . sodium chloride  3 mL Intravenous Q12H    Infusions:    PRN Medications: albuterol, albuterol   Assessment:    1) Acute renal failure, baseline 0.9  2) Acute on chronic diastolic HF  3) Cardio renal syndrome  4) HTN  5) DM  6) COPD    Plan/Discussion:    Brittney Li is still volume overloaded. Cr stable. Will increase lasix to 60 mg IV BID and supplement K+. Continue to follow renal function closely. PT recommends SNF and patient very debilitated and weak. Would not restart ACEI.    Case management following, need to decide where patient is going to go after being discharged since she does not want to return to the current nursing home.   Length of Stay: 3   Marca Ancona 03/02/2013, 10:46 AM  Advanced Heart Failure Team Pager [phone] (M-F; 7a - 4p)  Please contact [facility] for night-coverage after hours (4p -7a ) and weekends on [email or web address]

## 2013-03-02 NOTE — Progress Notes (Addendum)
[facility] PROGRESS NOTE Interim History: 79 y.o. female with hx of CHF, HTN, on Benazepril along with K supplements, DM, COPD, presented to [facility] ER with weakness. She was found to be in ARF with Cr of 2.5, K greater than 9.0 mEq/L with EKG showed widened QRS complex and symmetrically peaked Twaves. She was given Calcium Chloride, Insulin and Dextrose, along with 30 grams of Kayexalate. She was transferred subsequently to [facility] ER for further treatment. In the [facility] ER, she was found to have K of 8.0 mEq/L again with persistent widened QRS when compared to older EKG. She endorsed she has been feeling weaker recently.  Intake/Output Summary (Last 24 hours) at 03/02/13 0745 Last data filed at 03/01/13 1932  Gross per 24 hour  Intake    495 ml  Output   1375 ml  Net   -880 ml       Filed Weights   02/27/13 2204 03/01/13 1100 03/02/13 0456  Weight: 98.431 kg (217 lb) 102.8 kg (226 lb 10.1 oz) 103.2 kg (227 lb 8.2 oz)       Recent Labs  01/31/13 1230 02/04/13 0627 02/27/13 2158  PROBNP 4720.0* 2586.0* 4992.0*     Assessment/Plan: Acute respiratory failure with hypoxia/Pulmonary edema - was given lasix with good respond. Cont current dose of lasix. - most likely due to HF. - now on nasal canula sat >90 %.  Acute renal failure (ARF)/Acute hyperkalemia/Cardiorenal syndrome with renal failure: - cardiology and renal consulted. Estimated dry weight 96 Kg. - Renal duplex US rule out RAS. - mild hypokalemia on 10.1, b-met pending. - Has JVD with crackles on lower loves. - multifactorial ACE-I and supplement. - Kayexalate given. - nephrology no indication for HD.  CKD (chronic kidney disease) stage 3, GFR 30-59 ml/min - baseline Cr. Around 1.0.  DEMENTIA, MILD - stable.   CEREBROVASCULAR ACCIDENT, HX OF  -may need swallow eval - follow for s/s aspiration   Code Status: full Family Communication: none  Disposition Plan:  inpatient   Consultants:  Cardiology  renal  Procedures: ECHO 10.2.2014: showed EF 60% grade 2 diastolic HF.  Antibiotics:  none  HPI/Subjective: Pt is sleepy.   Objective: Filed Vitals:   03/01/13 1400 03/01/13 2027 03/01/13 2211 03/02/13 0456  BP: 123/52  142/58 138/47  Pulse: 45  75 80  Temp: 98.1 F (36.7 C)  99 F (37.2 C) 99.1 F (37.3 C)  TempSrc: Oral  Oral Oral  Resp: 22  20 18   Height:      Weight:    103.2 kg (227 lb 8.2 oz)  SpO2: 93% 93% 92% 94%     Exam:  General: Alert, awake, oriented x3, in no acute distress.  HEENT: No bruits, no goiter.  Heart: Regular rate and rhythm, without murmurs, rubs, gallops.  Lungs: Good air movement, bilateral air movement.  Abdomen: Soft, nontender, nondistended, positive bowel sounds.  Neuro: Grossly intact, nonfocal.   Data Reviewed: Basic Metabolic Panel:  Recent Labs Lab 02/28/13 1835 03/01/13 0030 03/01/13 0415 03/01/13 0800 03/02/13 0450  NA 139 140 139 140 141  K 4.7 4.3 3.9 3.9 3.4*  CL 100 100 98 100 97  CO2 29 32 35* 33* 33*  GLUCOSE 84 111* 90 88 90  BUN 49* 49* 47* 45* 40*  CREATININE 1.92* 1.85* 1.80* 1.64* 1.31*  CALCIUM 9.2 9.3 9.4 9.2 9.1   Liver Function Tests: No results found for this basename: AST, ALT, ALKPHOS, BILITOT, PROT, ALBUMIN,  in the last 168 hours  No results found for this basename: LIPASE, AMYLASE,  in the last 168 hours No results found for this basename: AMMONIA,  in the last 168 hours CBC:  Recent Labs Lab 02/27/13 2158 02/28/13 0336 02/28/13 0550 02/28/13 0945  WBC 7.3  --   --  8.6  NEUTROABS 5.0  --   --   --   HGB 11.1* 11.9* 12.2 11.8*  HCT 33.0* 35.0* 36.0 33.7*  MCV 79.7  --   --  77.3*  PLT 151  --   --  141*   Cardiac Enzymes:  Recent Labs Lab 02/27/13 2158  TROPONINI <0.30   BNP (last 3 results)  Recent Labs  01/31/13 1230 02/04/13 0627 02/27/13 2158  PROBNP 4720.0* 2586.0* 4992.0*   CBG:  Recent Labs Lab 02/28/13 1944  03/01/13 1114 03/01/13 1616 03/01/13 2216 03/02/13 0602  GLUCAP 91 106* 109* 104* 88    Recent Results (from the past 240 hour(s))  URINE CULTURE     Status: None   Collection Time    02/28/13 12:47 AM      Result Value Range Status   Specimen Description URINE, CATHETERIZED   Final   Special Requests NONE   Final   Culture  Setup Time     Final   Value: 02/28/2013 10:30     Performed at [facility] Count     Final   Value: >=100,000 COLONIES/ML     Performed at [facility]   Culture     Final   Value: KLEBSIELLA PNEUMONIAE     Performed at [facility]   Report Status 03/02/2013 FINAL   Final   Organism ID, Bacteria KLEBSIELLA PNEUMONIAE   Final  URINE CULTURE     Status: None   Collection Time    02/28/13  4:16 AM      Result Value Range Status   Specimen Description URINE, RANDOM   Final   Special Requests CX ADDED AT 0605 ON [id number]   Final   Culture  Setup Time     Final   Value: 02/28/2013 06:08     Performed at [facility]   Colony Count     Final   Value: >=100,000 COLONIES/ML     Performed at [facility]   Culture     Final   Value: KLEBSIELLA PNEUMONIAE     Note: SUSCEPTIBILITIES PERFORMED ON PREVIOUS CULTURE WITHIN THE LAST 5 DAYS.     Performed at [facility]   Report Status 03/02/2013 FINAL   Final  MRSA PCR SCREENING     Status: None   Collection Time    02/28/13  6:54 AM      Result Value Range Status   MRSA by PCR NEGATIVE  NEGATIVE Final   Comment:            The GeneXpert MRSA Assay (FDA     approved for NASAL specimens     only), is one component of a     comprehensive MRSA colonization     surveillance program. It is not     intended to diagnose MRSA     infection nor to guide or     monitor treatment for     MRSA infections.  URINE CULTURE     Status: None   Collection Time    02/28/13  6:56 AM      Result Value Range Status   Specimen Description URINE, CATHETERIZED  Final    Special Requests NONE   Final   Culture  Setup Time     Final   Value: 02/28/2013 14:06     Performed at [facility] Count     Final   Value: >=100,000 COLONIES/ML     Performed at [facility]   Culture     Final   Value: GRAM NEGATIVE RODS     Performed at [facility]   Report Status PENDING   Incomplete     Studies: Dg Chest Port 1 View  03/01/2013   CLINICAL DATA:  COPD.  EXAM: PORTABLE CHEST - 1 VIEW  COMPARISON:  02/27/2013  FINDINGS: Cardiomegaly. Diffuse interstitial and alveolar opacities throughout the lungs, likely edema. Bibasilar opacities likely reflect atelectasis. Small bilateral effusions. Findings similar to prior study.  IMPRESSION: Suspect mild edema/CHF. Small effusions and bibasilar atelectasis. No real change.   Electronically Signed   By: Charlett Nose M.D.   On: 03/01/2013 06:58    Scheduled Meds: . amLODipine  10 mg Oral Daily  . antiseptic oral rinse  15 mL Mouth Rinse BID  . aspirin EC  81 mg Oral Daily  . donepezil  10 mg Oral QHS  . famotidine  20 mg Oral Daily  . furosemide  40 mg Intravenous Q12H  . heparin  5,000 Units Subcutaneous Q8H  . mirtazapine  30 mg Oral QHS  . mometasone-formoterol  2 puff Inhalation BID  . multivitamin with minerals  1 tablet Oral Daily  . oxybutynin  5 mg Oral Daily  . pravastatin  80 mg Oral q1800  . sodium chloride  3 mL Intravenous Q12H   Continuous Infusions:    Marinda Elk  [facility] Pager [phone]. If 8PM-8AM, please contact night-coverage at [email or web address], password [email or web address] 03/02/2013, 7:45 AM  LOS: 3 days

## 2013-03-03 LAB — BASIC METABOLIC PANEL
Calcium: 8.6 mg/dL (ref 8.4–10.5)
Creatinine, Ser: 1.12 mg/dL — ABNORMAL HIGH (ref 0.50–1.10)
GFR calc Af Amer: 52 mL/min — ABNORMAL LOW (ref >90)
GFR calc non Af Amer: 45 mL/min — ABNORMAL LOW (ref >90)
Glucose, Bld: 95 mg/dL (ref 70–99)
Sodium: 140 mEq/L (ref 135–145)

## 2013-03-03 LAB — GLUCOSE, CAPILLARY
Glucose-Capillary: 132 mg/dL — ABNORMAL HIGH (ref 70–99)
Glucose-Capillary: 108 mg/dL — ABNORMAL HIGH (ref 70–99)

## 2013-03-03 MED ORDER — FUROSEMIDE 10 MG/ML IJ SOLN
60.0000 mg | Freq: Three times a day (TID) | INTRAMUSCULAR | Status: DC
  Administered 2013-03-03 – 2013-03-05 (×9): 60 mg via INTRAVENOUS
  Filled 2013-03-03 (×9): qty 6

## 2013-03-03 MED ORDER — FUROSEMIDE 10 MG/ML IJ SOLN: 60.0000 mg | Freq: Two times a day (BID) | INTRAMUSCULAR | Status: DC

## 2013-03-03 NOTE — Progress Notes (Signed)
Physical Therapy Treatment Patient Details Name: Brittney Li MRN: [id number] DOB: [birth year] Today's Date: 03/03/2013 Time: 1001-1010 PT Time Calculation (min): 9 min  PT Assessment / Plan / Recommendation  History of Present Illness 79 y.o female with PMH of CHF, HTN, on Benazepril along with K supplements, DM, COPD who presented to [facility] ED with hyperkalemia and chf.   PT Comments   Pt making steady progress.  Follow Up Recommendations  SNF     Does the patient have the potential to tolerate intense rehabilitation     Barriers to Discharge        Equipment Recommendations  None recommended by PT    Recommendations for Other Services    Frequency Min 2X/week   Progress towards PT Goals Progress towards PT goals: Progressing toward goals  Plan Current plan remains appropriate;Frequency needs to be updated    Precautions / Restrictions Precautions Precautions: Fall   Pertinent Vitals/Pain See flow sheet.    Mobility  Transfers Sit to Stand: 3: Mod assist;With upper extremity assist;With armrests;From chair/3-in-1 Stand to Sit: 4: Min assist;With upper extremity assist;With armrests;To chair/3-in-1 Ambulation/Gait Ambulation/Gait Assistance: 4: Min assist Ambulation Distance (Feet): 40 Feet Assistive device: Rolling walker Ambulation/Gait Assistance Details: Verbal cues to stand more erect. Gait Pattern: Step-through pattern;Decreased step length - right;Decreased step length - left;Shuffle;Trunk flexed;Wide base of support Gait velocity: decr    Exercises     PT Diagnosis:    PT Problem List:   PT Treatment Interventions:     PT Goals (current goals can now be found in the care plan section)    Visit Information  Last PT Received On: 03/03/13 Assistance Needed: +1 History of Present Illness: 79 y.o female with PMH of CHF, HTN, on Benazepril along with K supplements, DM, COPD who presented to [facility] ED with hyperkalemia and chf.    Subjective  Data      Cognition  Cognition Arousal/Alertness: Awake/alert Behavior During Therapy: WFL for tasks assessed/performed Overall Cognitive Status: Within Functional Limits for tasks assessed Memory: Decreased short-term memory    Balance  Static Standing Balance Static Standing - Balance Support: Bilateral upper extremity supported Static Standing - Level of Assistance: 4: Min assist  End of Session PT - End of Session Activity Tolerance: Patient limited by fatigue Patient left: in chair;with call bell/phone within reach Nurse Communication: Mobility status   GP     [facility] 03/03/2013, 11:05 AM  Skip Mayer PT [phone]

## 2013-03-03 NOTE — Progress Notes (Signed)
Advanced Heart Failure Rounding Note  Referring Physician: Dr. Tyson Li  Primary Physician: Dr. Felecia Li  Reason for Consultation: Diastolic HF  HPI:    Brittney Li is an 79 y.o. female with hx of morbid obesity, diastolic HF EF 60-65% (01/2013), HTN, DM, and COPD. She resides in assisted living at [facility] in [place].   She was admitted in early Septemeber with SOB and was found to have diastolic HF, with grade II diastolic dysfunction. Pro-BNP 4720 and was treated with IV lasix. Discharge weight 220 lbs   She presented to [facility] ER with weakness and was found to be in ARF with Cr of 2.5, K greater than 9.0 mEq/L. EKG showed widen QRS complex and symmetrical peak Twaves. She was treated with Calcium Chloride, Insulin and Dextrose, along with 30 grams of Kayexalate and transferred to [facility]. Her Benazepril was stopped and she received fluids. Nephrology was consulted and have been following.  Stable. Cr 1.12. Weights and 24 hr I/O not accurate. Still SOB and orthopnea. Denies CP. TED hose placed. OOB to chair yesterday and is working with PT. Recommended SNF and they are looking at [facility]. Lasix was stopped yesterday.   Objective:   Weight Range:  Vital Signs:   Temp:  [97.2 F (36.2 C)-98.7 F (37.1 C)] 97.2 F (36.2 C) (10/03 0527) Pulse Rate:  [66-68] 67 (10/02 2008) Resp:  [16-18] 18 (10/03 0527) BP: (118-137)/(50-57) 127/57 mmHg (10/03 0527) SpO2:  [92 %-97 %] 97 % (10/03 0527) Weight:  [226 lb 10.1 oz (102.8 kg)] 226 lb 10.1 oz (102.8 kg) (10/03 0527) Last BM Date: 03/02/13  Weight change: Filed Weights   03/01/13 1100 03/02/13 0456 03/03/13 0527  Weight: 226 lb 10.1 oz (102.8 kg) 227 lb 8.2 oz (103.2 kg) 226 lb 10.1 oz (102.8 kg)    Intake/Output:   Intake/Output Summary (Last 24 hours) at 03/03/13 0940 Last data filed at 03/03/13 0829  Gross per 24 hour  Intake    723 ml  Output    700 ml  Net     23 ml     Physical Exam:  General: Chronically ill  appearing. No resp difficulty  HEENT: normal  Neck: supple. JVP 10 cm. Carotids 2+ bilat; no bruits. No lymphadenopathy or thryomegaly appreciated.  Cor: PMI nondisplaced. Regular rate & rhythm. No rubs, gallops or murmurs.  Lungs: clear  Abdomen: obese, soft, nontender, nondistended. No hepatosplenomegaly. No bruits or masses. Good bowel sounds.  Extremities: no cyanosis, clubbing, rash, 2+ bilateral edema  Neuro: alert & orientedx3, cranial nerves grossly intact. moves all 4 extremities w/o difficulty. Affect pleasant   Telemetry: SR 70s   Labs: Basic Metabolic Panel:  Recent Labs Lab 03/01/13 0030 03/01/13 0415 03/01/13 0800 03/02/13 0450 03/03/13 0515  NA 140 139 140 141 140  K 4.3 3.9 3.9 3.4* 4.1  CL 100 98 100 97 100  CO2 32 35* 33* 33* 35*  GLUCOSE 111* 90 88 90 95  BUN 49* 47* 45* 40* 37*  CREATININE 1.85* 1.80* 1.64* 1.31* 1.12*  CALCIUM 9.3 9.4 9.2 9.1 8.6    Liver Function Tests: No results found for this basename: AST, ALT, ALKPHOS, BILITOT, PROT, ALBUMIN,  in the last 168 hours No results found for this basename: LIPASE, AMYLASE,  in the last 168 hours No results found for this basename: AMMONIA,  in the last 168 hours  CBC:  Recent Labs Lab 02/27/13 2158 02/28/13 0336 02/28/13 0550 02/28/13 0945  WBC 7.3  --   --  8.6  NEUTROABS 5.0  --   --   --   HGB 11.1* 11.9* 12.2 11.8*  HCT 33.0* 35.0* 36.0 33.7*  MCV 79.7  --   --  77.3*  PLT 151  --   --  141*    Cardiac Enzymes:  Recent Labs Lab 02/27/13 2158  TROPONINI <0.30    BNP: BNP (last 3 results)  Recent Labs  01/31/13 1230 02/04/13 0627 02/27/13 2158  PROBNP 4720.0* 2586.0* 4992.0*     Other results:  EKG:   Imaging: No results found.   Medications:     Scheduled Medications: . amLODipine  10 mg Oral Daily  . antiseptic oral rinse  15 mL Mouth Rinse BID  . aspirin EC  81 mg Oral Daily  . donepezil  10 mg Oral QHS  . famotidine  20 mg Oral Daily  . furosemide  60  mg Intravenous BID  . heparin  5,000 Units Subcutaneous Q8H  . mirtazapine  30 mg Oral QHS  . mometasone-formoterol  2 puff Inhalation BID  . multivitamin with minerals  1 tablet Oral Daily  . oxybutynin  5 mg Oral Daily  . potassium chloride  20 mEq Oral BID  . pravastatin  80 mg Oral q1800  . sodium chloride  3 mL Intravenous Q12H    Infusions:    PRN Medications: albuterol, albuterol   Assessment:    1) Acute renal failure, baseline 0.9  2) Acute on chronic diastolic HF  3) Cardio renal syndrome  4) HTN  5) DM  6) COPD    Plan/Discussion:    Brittney Li is still volume overloaded, still SOB. Will increase lasix to 60 mg IV TID. Cr stable. Continue to follow renal function closely. PT recommends SNF. Doing better getting up OOB.   Case management following, need to decide where patient is going to go after being discharged since she does not want to return to the current nursing home.   Length of Stay: 4   Brittney Li 03/03/2013, 9:40 AM  Advanced Heart Failure Team Pager [phone] (M-F; 7a - 4p)  Please contact [facility] for night-coverage after hours (4p -7a ) and weekends on [email or web address]  Patient seen with NP, agree with the above note.  She is volume overloaded still and is short of breath.  It appears that she actually did not get any Lasix yesterday.  Will have her get Lasix 60 mg IV every 8 hrs today.  Hopefully we can get an adequate amount of fluid off her in a couple more days.  Creatinine is lower today.   Brittney Li 03/03/2013 9:50 AM

## 2013-03-03 NOTE — Progress Notes (Signed)
[facility] PROGRESS NOTE Interim History: 79 y.o. female with hx of CHF, HTN, on Benazepril along with K supplements, DM, COPD, presented to [facility] ER with weakness. She was found to be in ARF with Cr of 2.5, K greater than 9.0 mEq/L with EKG showed widened QRS complex and symmetrically peaked Twaves. She was given Calcium Chloride, Insulin and Dextrose, along with 30 grams of Kayexalate. She was transferred subsequently to [facility] ER for further treatment. In the [facility] ER, she was found to have K of 8.0 mEq/L again with persistent widened QRS when compared to older EKG. She endorsed she has been feeling weaker recently.  Intake/Output Summary (Last 24 hours) at 03/03/13 0853 Last data filed at 03/03/13 0829  Gross per 24 hour  Intake    723 ml  Output    700 ml  Net     23 ml       Filed Weights   03/01/13 1100 03/02/13 0456 03/03/13 0527  Weight: 102.8 kg (226 lb 10.1 oz) 103.2 kg (227 lb 8.2 oz) 102.8 kg (226 lb 10.1 oz)        Recent Labs  01/31/13 1230 02/04/13 0627 02/27/13 2158  PROBNP 4720.0* 2586.0* 4992.0*     Assessment/Plan: Acute respiratory failure with hypoxia/Pulmonary edema - Cont current dose of lasix. - most likely due to HF. - now on nasal canula sat >90 %.  Acute renal failure (ARF)/Acute hyperkalemia/Cardiorenal syndrome with renal failure: - cardiology. Estimated dry weight 96 Kg. - Renal duplex US no RAS. - Creatinine cont to improved. - Has JVD lung seem clear. - multifactorial ACE-I and supplement. - Kayexalate given.  CKD (chronic kidney disease) stage 3, GFR 30-59 ml/min - baseline Cr. Around 1.0. - trending down at baseline  DEMENTIA, MILD - stable.   CEREBROVASCULAR ACCIDENT, HX OF  -may need swallow eval - follow for s/s aspiration   Code Status: full Family Communication: none  Disposition Plan: inpatient   Consultants:  Cardiology  renal  Procedures: ECHO 10.2.2014: showed EF 60% grade 2 diastolic  HF.  Antibiotics:  none  HPI/Subjective: Feels better no complains.   Objective: Filed Vitals:   03/02/13 1404 03/02/13 1705 03/02/13 2008 03/03/13 0527  BP: 118/53 124/50 123/56 127/57  Pulse: 66 66 67   Temp: 98 F (36.7 C) 98.6 F (37 C) 98.5 F (36.9 C) 97.2 F (36.2 C)  TempSrc: Oral Oral Oral Axillary  Resp: 16 18 18 18   Height:      Weight:    102.8 kg (226 lb 10.1 oz)  SpO2: 96% 95% 96% 97%     Exam:  General: Alert, awake, oriented x3, in no acute distress.  HEENT: No bruits, no goiter. JVD Heart: Regular rate and rhythm, without murmurs, rubs, gallops.  Lungs: Good air movement, bilateral air movement.  Abdomen: Soft, nontender, nondistended, positive bowel sounds.  Neuro: Grossly intact, nonfocal.   Data Reviewed: Basic Metabolic Panel:  Recent Labs Lab 03/01/13 0030 03/01/13 0415 03/01/13 0800 03/02/13 0450 03/03/13 0515  NA 140 139 140 141 140  K 4.3 3.9 3.9 3.4* 4.1  CL 100 98 100 97 100  CO2 32 35* 33* 33* 35*  GLUCOSE 111* 90 88 90 95  BUN 49* 47* 45* 40* 37*  CREATININE 1.85* 1.80* 1.64* 1.31* 1.12*  CALCIUM 9.3 9.4 9.2 9.1 8.6   Liver Function Tests: No results found for this basename: AST, ALT, ALKPHOS, BILITOT, PROT, ALBUMIN,  in the last 168 hours No results found for this  basename: LIPASE, AMYLASE,  in the last 168 hours No results found for this basename: AMMONIA,  in the last 168 hours CBC:  Recent Labs Lab 02/27/13 2158 02/28/13 0336 02/28/13 0550 02/28/13 0945  WBC 7.3  --   --  8.6  NEUTROABS 5.0  --   --   --   HGB 11.1* 11.9* 12.2 11.8*  HCT 33.0* 35.0* 36.0 33.7*  MCV 79.7  --   --  77.3*  PLT 151  --   --  141*   Cardiac Enzymes:  Recent Labs Lab 02/27/13 2158  TROPONINI <0.30   BNP (last 3 results)  Recent Labs  01/31/13 1230 02/04/13 0627 02/27/13 2158  PROBNP 4720.0* 2586.0* 4992.0*   CBG:  Recent Labs Lab 03/02/13 0602 03/02/13 1135 03/02/13 1600 03/02/13 2125 03/03/13 0559  GLUCAP  88 109* 114* 132* 108*    Recent Results (from the past 240 hour(s))  URINE CULTURE     Status: None   Collection Time    02/28/13 12:47 AM      Result Value Range Status   Specimen Description URINE, CATHETERIZED   Final   Special Requests NONE   Final   Culture  Setup Time     Final   Value: 02/28/2013 10:30     Performed at [facility] Count     Final   Value: >=100,000 COLONIES/ML     Performed at [facility]   Culture     Final   Value: KLEBSIELLA PNEUMONIAE     Performed at [facility]   Report Status 03/02/2013 FINAL   Final   Organism ID, Bacteria KLEBSIELLA PNEUMONIAE   Final  URINE CULTURE     Status: None   Collection Time    02/28/13  4:16 AM      Result Value Range Status   Specimen Description URINE, RANDOM   Final   Special Requests CX ADDED AT 0605 ON [id number]   Final   Culture  Setup Time     Final   Value: 02/28/2013 06:08     Performed at [facility]   Colony Count     Final   Value: >=100,000 COLONIES/ML     Performed at [facility]   Culture     Final   Value: KLEBSIELLA PNEUMONIAE     Note: SUSCEPTIBILITIES PERFORMED ON PREVIOUS CULTURE WITHIN THE LAST 5 DAYS.     Performed at [facility]   Report Status 03/02/2013 FINAL   Final  MRSA PCR SCREENING     Status: None   Collection Time    02/28/13  6:54 AM      Result Value Range Status   MRSA by PCR NEGATIVE  NEGATIVE Final   Comment:            The GeneXpert MRSA Assay (FDA     approved for NASAL specimens     only), is one component of a     comprehensive MRSA colonization     surveillance program. It is not     intended to diagnose MRSA     infection nor to guide or     monitor treatment for     MRSA infections.  URINE CULTURE     Status: None   Collection Time    02/28/13  6:56 AM      Result Value Range Status   Specimen Description URINE, CATHETERIZED   Final   Special  Requests NONE   Final   Culture  Setup Time     Final    Value: 02/28/2013 14:06     Performed at [facility] Count     Final   Value: >=100,000 COLONIES/ML     Performed at [facility]   Culture     Final   Value: KLEBSIELLA PNEUMONIAE     Performed at [facility]   Report Status 03/02/2013 FINAL   Final   Organism ID, Bacteria KLEBSIELLA PNEUMONIAE   Final     Studies: No results found.  Scheduled Meds: . amLODipine  10 mg Oral Daily  . antiseptic oral rinse  15 mL Mouth Rinse BID  . aspirin EC  81 mg Oral Daily  . donepezil  10 mg Oral QHS  . famotidine  20 mg Oral Daily  . heparin  5,000 Units Subcutaneous Q8H  . mirtazapine  30 mg Oral QHS  . mometasone-formoterol  2 puff Inhalation BID  . multivitamin with minerals  1 tablet Oral Daily  . oxybutynin  5 mg Oral Daily  . potassium chloride  20 mEq Oral BID  . pravastatin  80 mg Oral q1800  . sodium chloride  3 mL Intravenous Q12H   Continuous Infusions:    Marinda Elk  [facility] Pager [phone]. If 8PM-8AM, please contact night-coverage at [email or web address], password [facility] 03/03/2013, 8:53 AM  LOS: 4 days

## 2013-03-03 NOTE — Progress Notes (Signed)
Pt O4x, no complaints of pain or sob, ted hose on. Pt states she cough up small amount of blood on 10-2, none today. Pt on 3l of O2 will add humidity . Will continue to monitor

## 2013-03-04 LAB — BASIC METABOLIC PANEL
BUN: 36 mg/dL — ABNORMAL HIGH (ref 6–23)
CO2: 38 mEq/L — ABNORMAL HIGH (ref 19–32)
Creatinine, Ser: 1.14 mg/dL — ABNORMAL HIGH (ref 0.50–1.10)
GFR calc non Af Amer: 44 mL/min — ABNORMAL LOW (ref >90)
Glucose, Bld: 93 mg/dL (ref 70–99)
Potassium: 4.1 mEq/L (ref 3.5–5.1)
Sodium: 147 mEq/L — ABNORMAL HIGH (ref 135–145)

## 2013-03-04 LAB — GLUCOSE, CAPILLARY
Glucose-Capillary: 92 mg/dL (ref 70–99)
Glucose-Capillary: 113 mg/dL — ABNORMAL HIGH (ref 70–99)

## 2013-03-04 NOTE — Progress Notes (Signed)
[facility] PROGRESS NOTE Interim History: 79 y.o. female with hx of CHF, HTN, on Benazepril along with K supplements, DM, COPD, presented to [facility] ER with weakness. She was found to be in ARF with Cr of 2.5, K greater than 9.0 mEq/L with EKG showed widened QRS complex and symmetrically peaked Twaves. She was given Calcium Chloride, Insulin and Dextrose, along with 30 grams of Kayexalate. She was transferred subsequently to [facility] ER for further treatment. In the [facility] ER, she was found to have K of 8.0 mEq/L again with persistent widened QRS when compared to older EKG. She endorsed she has been feeling weaker recently.  Intake/Output Summary (Last 24 hours) at 03/03/13 0853 Last data filed at 03/03/13 0829  Gross per 24 hour  Intake    723 ml  Output    700 ml  Net     23 ml       Filed Weights   03/01/13 1100 03/02/13 0456 03/03/13 0527  Weight: 102.8 kg (226 lb 10.1 oz) 103.2 kg (227 lb 8.2 oz) 102.8 kg (226 lb 10.1 oz)        Recent Labs  01/31/13 1230 02/04/13 0627 02/27/13 2158  PROBNP 4720.0* 2586.0* 4992.0*     Assessment/Plan: Acute respiratory failure with hypoxia/Pulmonary edema/ Acute diastolic heart failure: - Cont current dose of lasix. Cardiology on board. b-met daily monitor electrolytes. - Cont to be negative, weight decreasing. - Appreciate Cardiology assistance. Estimated dry weight 96 Kg. - still has + HJ reflex.  Acute on CKD (chronic kidney disease) stage 3, GFR 30-59 ml/min/Acute hyperkalemia/Cardiorenal syndrome with renal failure: - Renal duplex US no RAS. - Creatinine at baseline.  DEMENTIA, MILD - stable.   CEREBROVASCULAR ACCIDENT, HX OF  - cont ASA.  Code Status: full Family Communication: none  Disposition Plan: inpatient   Consultants:  Cardiology  renal  Procedures: ECHO 10.2.2014: showed EF 60% grade 2 diastolic HF.  Antibiotics:  none  HPI/Subjective: Feels better no complains.   Objective: Filed Vitals:   03/02/13  1404 03/02/13 1705 03/02/13 2008 03/03/13 0527  BP: 118/53 124/50 123/56 127/57  Pulse: 66 66 67   Temp: 98 F (36.7 C) 98.6 F (37 C) 98.5 F (36.9 C) 97.2 F (36.2 C)  TempSrc: Oral Oral Oral Axillary  Resp: 16 18 18 18   Height:      Weight:    102.8 kg (226 lb 10.1 oz)  SpO2: 96% 95% 96% 97%     Exam:  General: Alert, awake, oriented x3, in no acute distress.  HEENT: No bruits, no goiter. JVD Heart: Regular rate and rhythm, without murmurs, rubs, gallops.  Lungs: Good air movement, bilateral air movement.     Data Reviewed: Basic Metabolic Panel:  Recent Labs Lab 03/01/13 0030 03/01/13 0415 03/01/13 0800 03/02/13 0450 03/03/13 0515  NA 140 139 140 141 140  K 4.3 3.9 3.9 3.4* 4.1  CL 100 98 100 97 100  CO2 32 35* 33* 33* 35*  GLUCOSE 111* 90 88 90 95  BUN 49* 47* 45* 40* 37*  CREATININE 1.85* 1.80* 1.64* 1.31* 1.12*  CALCIUM 9.3 9.4 9.2 9.1 8.6   Liver Function Tests: No results found for this basename: AST, ALT, ALKPHOS, BILITOT, PROT, ALBUMIN,  in the last 168 hours No results found for this basename: LIPASE, AMYLASE,  in the last 168 hours No results found for this basename: AMMONIA,  in the last 168 hours CBC:  Recent Labs Lab 02/27/13 2158 02/28/13 0336 02/28/13 0550 02/28/13  0945  WBC 7.3  --   --  8.6  NEUTROABS 5.0  --   --   --   HGB 11.1* 11.9* 12.2 11.8*  HCT 33.0* 35.0* 36.0 33.7*  MCV 79.7  --   --  77.3*  PLT 151  --   --  141*   Cardiac Enzymes:  Recent Labs Lab 02/27/13 2158  TROPONINI <0.30   BNP (last 3 results)  Recent Labs  01/31/13 1230 02/04/13 0627 02/27/13 2158  PROBNP 4720.0* 2586.0* 4992.0*   CBG:  Recent Labs Lab 03/02/13 0602 03/02/13 1135 03/02/13 1600 03/02/13 2125 03/03/13 0559  GLUCAP 88 109* 114* 132* 108*    Recent Results (from the past 240 hour(s))  URINE CULTURE     Status: None   Collection Time    02/28/13 12:47 AM      Result Value Range Status   Specimen Description URINE,  CATHETERIZED   Final   Special Requests NONE   Final   Culture  Setup Time     Final   Value: 02/28/2013 10:30     Performed at [facility] Count     Final   Value: >=100,000 COLONIES/ML     Performed at [facility]   Culture     Final   Value: KLEBSIELLA PNEUMONIAE     Performed at [facility]   Report Status 03/02/2013 FINAL   Final   Organism ID, Bacteria KLEBSIELLA PNEUMONIAE   Final  URINE CULTURE     Status: None   Collection Time    02/28/13  4:16 AM      Result Value Range Status   Specimen Description URINE, RANDOM   Final   Special Requests CX ADDED AT 0605 ON [id number]   Final   Culture  Setup Time     Final   Value: 02/28/2013 06:08     Performed at [facility]   Colony Count     Final   Value: >=100,000 COLONIES/ML     Performed at [facility]   Culture     Final   Value: KLEBSIELLA PNEUMONIAE     Note: SUSCEPTIBILITIES PERFORMED ON PREVIOUS CULTURE WITHIN THE LAST 5 DAYS.     Performed at [facility]   Report Status 03/02/2013 FINAL   Final  MRSA PCR SCREENING     Status: None   Collection Time    02/28/13  6:54 AM      Result Value Range Status   MRSA by PCR NEGATIVE  NEGATIVE Final   Comment:            The GeneXpert MRSA Assay (FDA     approved for NASAL specimens     only), is one component of a     comprehensive MRSA colonization     surveillance program. It is not     intended to diagnose MRSA     infection nor to guide or     monitor treatment for     MRSA infections.  URINE CULTURE     Status: None   Collection Time    02/28/13  6:56 AM      Result Value Range Status   Specimen Description URINE, CATHETERIZED   Final   Special Requests NONE   Final   Culture  Setup Time     Final   Value: 02/28/2013 14:06     Performed at [facility] Count  Final   Value: >=100,000 COLONIES/ML     Performed at [facility]   Culture     Final   Value: KLEBSIELLA  PNEUMONIAE     Performed at [facility]   Report Status 03/02/2013 FINAL   Final   Organism ID, Bacteria KLEBSIELLA PNEUMONIAE   Final     Studies: No results found.  Scheduled Meds: . amLODipine  10 mg Oral Daily  . antiseptic oral rinse  15 mL Mouth Rinse BID  . aspirin EC  81 mg Oral Daily  . donepezil  10 mg Oral QHS  . famotidine  20 mg Oral Daily  . heparin  5,000 Units Subcutaneous Q8H  . mirtazapine  30 mg Oral QHS  . mometasone-formoterol  2 puff Inhalation BID  . multivitamin with minerals  1 tablet Oral Daily  . oxybutynin  5 mg Oral Daily  . potassium chloride  20 mEq Oral BID  . pravastatin  80 mg Oral q1800  . sodium chloride  3 mL Intravenous Q12H   Continuous Infusions:    Marinda Elk  [facility] Pager [phone]. If 8PM-8AM, please contact night-coverage at [email or web address], password [facility] 03/03/2013, 8:53 AM  LOS: 4 days

## 2013-03-04 NOTE — Progress Notes (Signed)
Pt ambulated to hallway with use of O2. C/o being very tired

## 2013-03-04 NOTE — Progress Notes (Signed)
Patient ID: Brittney Li, female   DOB: 10/27/1929, 79 y.o.   MRN: [id number]      Primary cardiologist:  Subjective:   Patient reports some mild SOB today. Asking for more frequent breathing treatments.    Objective:   Temp:  [98 F (36.7 C)-98.6 F (37 C)] 98.3 F (36.8 C) (10/04 0700) Pulse Rate:  [60-73] 60 (10/04 0700) Resp:  [16-20] 20 (10/04 0700) BP: (117-139)/(52-70) 120/53 mmHg (10/04 0700) SpO2:  [95 %-98 %] 96 % (10/04 0700) Weight:  [217 lb 9.5 oz (98.7 kg)] 217 lb 9.5 oz (98.7 kg) (10/04 0700) Last BM Date: 03/03/13  Filed Weights   03/02/13 0456 03/03/13 0527 03/04/13 0700  Weight: 227 lb 8.2 oz (103.2 kg) 226 lb 10.1 oz (102.8 kg) 217 lb 9.5 oz (98.7 kg)    Intake/Output Summary (Last 24 hours) at 03/04/13 0735 Last data filed at 03/04/13 0700  Gross per 24 hour  Intake    540 ml  Output   1251 ml  Net   -711 ml    Telemetry:NSR  Exam:  General:NAD  HEENT:sclera clear, pupils equal round and reactive  Resp:faint crackles in bases, expiratory wheeze  Cardiac:RRR, no m/r/g, no JVD  NW:GNFAOZH soft, NT, ND  YQM:VHQI, 1+ bilateral edema  Neuro: no focal deficits  Psych: appropriate affect  Lab Results:  Basic Metabolic Panel:  Recent Labs Lab 03/02/13 0450 03/03/13 0515 03/04/13 0455  NA 141 140 147*  K 3.4* 4.1 4.1  CL 97 100 102  CO2 33* 35* 38*  GLUCOSE 90 95 93  BUN 40* 37* 36*  CREATININE 1.31* 1.12* 1.14*  CALCIUM 9.1 8.6 8.8    Liver Function Tests: No results found for this basename: AST, ALT, ALKPHOS, BILITOT, PROT, ALBUMIN,  in the last 168 hours  CBC:  Recent Labs Lab 02/27/13 2158 02/28/13 0336 02/28/13 0550 02/28/13 0945  WBC 7.3  --   --  8.6  HGB 11.1* 11.9* 12.2 11.8*  HCT 33.0* 35.0* 36.0 33.7*  MCV 79.7  --   --  77.3*  PLT 151  --   --  141*    Cardiac Enzymes:  Recent Labs Lab 02/27/13 2158  TROPONINI <0.30    BNP:  Recent Labs  01/31/13 1230 02/04/13 0627 02/27/13 2158  PROBNP  4720.0* 2586.0* 4992.0*    Coagulation: No results found for this basename: INR,  in the last 168 hours  ECG:   Medications:   Scheduled Medications: . amLODipine  10 mg Oral Daily  . antiseptic oral rinse  15 mL Mouth Rinse BID  . aspirin EC  81 mg Oral Daily  . donepezil  10 mg Oral QHS  . famotidine  20 mg Oral Daily  . furosemide  60 mg Intravenous TID  . heparin  5,000 Units Subcutaneous Q8H  . mirtazapine  30 mg Oral QHS  . mometasone-formoterol  2 puff Inhalation BID  . multivitamin with minerals  1 tablet Oral Daily  . oxybutynin  5 mg Oral Daily  . potassium chloride  20 mEq Oral BID  . pravastatin  80 mg Oral q1800  . sodium chloride  3 mL Intravenous Q12H     Infusions:     PRN Medications:  albuterol, albuterol     Assessment/Plan   79 yo female with history of diastolic heart failure, HTN, DM, and COPD admitted with AKI and hyperkalemia with K greater than 9.   1. Acute on chronic diastolic heart failure - still with  some evidence of volume overload by exam and symptoms, Cr trending down with continued diuresis. Recommend continue IV diuresis today  2. AKI - Cr continues to improve, she has been started back on IV diuresis with lasix 60mg  tid and her Cr continues to trend down.  - off ACE-I due to presentation with AKI and severe hyperkalemia  3. Hyperkalemia - resolved - continue to follow labs while aggressively diuresis.   4. COPD - patient asking for more frequent nebs, written as q4 prn but does not appear she is asking for. Patient made aware of prn order and that she can ask for as needed.       Dina Rich, M.D., F.A.C.C.

## 2013-03-05 LAB — BASIC METABOLIC PANEL
BUN: 34 mg/dL — ABNORMAL HIGH (ref 6–23)
CO2: 38 mEq/L — ABNORMAL HIGH (ref 19–32)
CO2: 41 mEq/L (ref 19–32)
Calcium: 9 mg/dL (ref 8.4–10.5)
Creatinine, Ser: 1.09 mg/dL (ref 0.50–1.10)
GFR calc Af Amer: 54 mL/min — ABNORMAL LOW (ref >90)
GFR calc Af Amer: 54 mL/min — ABNORMAL LOW (ref >90)
GFR calc non Af Amer: 46 mL/min — ABNORMAL LOW (ref >90)
GFR calc non Af Amer: 47 mL/min — ABNORMAL LOW (ref >90)
Glucose, Bld: 91 mg/dL (ref 70–99)
Glucose, Bld: 112 mg/dL — ABNORMAL HIGH (ref 70–99)
Potassium: 4.2 mEq/L (ref 3.5–5.1)
Sodium: 144 mEq/L (ref 135–145)
Sodium: 143 mEq/L (ref 135–145)

## 2013-03-05 LAB — GLUCOSE, CAPILLARY
Glucose-Capillary: 93 mg/dL (ref 70–99)
Glucose-Capillary: 117 mg/dL — ABNORMAL HIGH (ref 70–99)

## 2013-03-05 MED ORDER — FUROSEMIDE 40 MG PO TABS: 40.0000 mg | ORAL_TABLET | Freq: Two times a day (BID) | ORAL | Status: DC

## 2013-03-05 NOTE — Progress Notes (Signed)
Patient ID: Brittney Li, female   DOB: 09-Dec-1929, 79 y.o.   MRN: [id number]     Primary cardiologist:  Subjective:   Breathing improving   Objective:   Temp:  [98 F (36.7 C)-98.2 F (36.8 C)] 98.2 F (36.8 C) (10/04 2100) Pulse Rate:  [66-70] 66 (10/04 2100) Resp:  [20-22] 22 (10/04 2100) BP: (134-141)/(49-61) 141/61 mmHg (10/04 2100) SpO2:  [92 %-99 %] 92 % (10/04 2100) Last BM Date: 03/03/13  Filed Weights   03/02/13 0456 03/03/13 0527 03/04/13 0700  Weight: 227 lb 8.2 oz (103.2 kg) 226 lb 10.1 oz (102.8 kg) 217 lb 9.5 oz (98.7 kg)    Intake/Output Summary (Last 24 hours) at 03/05/13 0723 Last data filed at 03/04/13 2257  Gross per 24 hour  Intake    480 ml  Output   1600 ml  Net  -1120 ml    Telemetry: NSR, 7 beat run of NSVT  Exam: General:NAD   HEENT:sclera clear, pupils equal round and reactive   Resp:faint crackles in bases, expiratory wheeze   Cardiac:RRR, no m/r/g, no JVD   WU:JWJXBJY soft, NT, ND   NWG:NFAO, 1+ bilateral edema   Neuro: no focal deficits   Psych: appropriate affect   Lab Results:  Basic Metabolic Panel:  Recent Labs Lab 03/03/13 0515 03/04/13 0455 03/05/13 0410  NA 140 147* 144  K 4.1 4.1 4.1  CL 100 102 101  CO2 35* 38* 38*  GLUCOSE 95 93 91  BUN 37* 36* 34*  CREATININE 1.12* 1.14* 1.09  CALCIUM 8.6 8.8 9.0    Liver Function Tests: No results found for this basename: AST, ALT, ALKPHOS, BILITOT, PROT, ALBUMIN,  in the last 168 hours  CBC:  Recent Labs Lab 02/27/13 2158 02/28/13 0336 02/28/13 0550 02/28/13 0945  WBC 7.3  --   --  8.6  HGB 11.1* 11.9* 12.2 11.8*  HCT 33.0* 35.0* 36.0 33.7*  MCV 79.7  --   --  77.3*  PLT 151  --   --  141*    Cardiac Enzymes:  Recent Labs Lab 02/27/13 2158  TROPONINI <0.30    BNP:  Recent Labs  02/04/13 0627 02/27/13 2158 03/05/13 0410  PROBNP 2586.0* 4992.0* 4765.0*    Coagulation: No results found for this basename: INR,  in the last 168  hours  ECG:   Medications:   Scheduled Medications: . amLODipine  10 mg Oral Daily  . antiseptic oral rinse  15 mL Mouth Rinse BID  . aspirin EC  81 mg Oral Daily  . donepezil  10 mg Oral QHS  . famotidine  20 mg Oral Daily  . furosemide  60 mg Intravenous TID  . heparin  5,000 Units Subcutaneous Q8H  . mirtazapine  30 mg Oral QHS  . mometasone-formoterol  2 puff Inhalation BID  . multivitamin with minerals  1 tablet Oral Daily  . oxybutynin  5 mg Oral Daily  . potassium chloride  20 mEq Oral BID  . pravastatin  80 mg Oral q1800  . sodium chloride  3 mL Intravenous Q12H     Infusions:     PRN Medications:  albuterol, albuterol   03/01/13 Echo: LVEF 50-55%, grade II diastolic dysfunction   Assessment/Plan    79 yo female with history of diastolic heart failure, HTN, DM, and COPD admitted with AKI and hyperkalemia with K greater than 9.   1. Acute on chronic diastolic heart failure  - suspect still slightly hypervolemic, Cr trending down with  diuresis. Recommend one more day IV diuresis, convert to orals tomorrow. Ambulate tomorrow, pending symptoms consider possible discharge tomorrow.   2. AKI  - Cr continues to improve, she has been started back on IV diuresis with lasix 60mg  tid and her Cr continues to trend down.  - off ACE-I due to presentation with AKI and severe hyperkalemia   3. Hyperkalemia  - resolved  - continue to follow labs while aggressively diuresis.        Dina Rich, M.D., F.A.C.C.

## 2013-03-05 NOTE — Discharge Summary (Signed)
Physician Discharge Summary  Brittney Li WUJ:[id number] DOB: Nov 04, 1929 DOA: 02/27/2013  PCP: Avon Gully, MD  Admit date: 02/27/2013 Discharge date: 03/06/2013  Time spent: 35 minutes  Recommendations for Outpatient Follow-up:  1. Follow up with cardiology in 1 week. 2. pcp in 3 weeks. 3. b-met on 10.10.2014. Daily weights.   BNP   Estimated dry weight 96.9 kg   BNP    Component Value Date/Time   PROBNP 4765.0* 03/05/2013 0410    Discharge Diagnoses:  Principal Problem:   Acute respiratory failure with hypoxia Active Problems:   DEMENTIA, MILD   HYPERTENSION   BRONCHITIS, CHRONIC NOS   CEREBROVASCULAR ACCIDENT, HX OF   Acute renal failure (ARF)   Acute hyperkalemia   Acute diastolic heart failure   CKD (chronic kidney disease) stage 3, GFR 30-59 ml/min   Hypoglycemia   Cardiorenal syndrome with renal failure   Discharge Condition: stable  Diet recommendation: low sodium diet  Filed Weights   03/04/13 0700 03/05/13 0700 03/06/13 9562  Weight: 98.7 kg (217 lb 9.5 oz) 97.2 kg (214 lb 4.6 oz) 96.9 kg (213 lb 10 oz)    History of present illness:  79 y.o. female with hx of CHF, HTN, on Benazepril along with K supplements, DM, COPD, presnets to [facility] ER with weakness. She was found to be in ARF with Cr of 2.5, K greater than 9.0 mEq/L with EKG showed widen QRS complex and symmetrical peak Twaves. She was given Calcium Chloride, Insulin and Dextrose, along with 30 grams of Kayexalate. She was transferred subsequently to [facility] ER for further treatment. In the ER, she was found to have K of 8.0 mEq/L again with persistent widen QRS compared to older EKG. Hospitalist was asked to admit her for hyperkalemia. She has been feeling weaker recently. Last week, she was able to ambulate, but she is to weak to walk now. She was in the nursing home, but it was not helping, so she returned home now.   Hospital Course:  Acute respiratory failure with hypoxia/Pulmonary edema/ Acute  diastolic heart failure:  - Started on IV lasix,diureses well,  change to orals torsemide. - B-met daily monitor electrolytes.  - Appreciate Cardiology assistance. Estimated dry weight 96 Kg.  - she will need  Acute on CKD (chronic kidney disease) stage 3, GFR 30-59 ml/min/Acute hyperkalemia/Cardiorenal syndrome with renal failure:  - Renal duplex US no RAS. High on admission improved with diuresis. - Creatinine at baseline.   DEMENTIA, MILD  - stable.   CEREBROVASCULAR ACCIDENT, HX OF  - cont ASA.   Procedures: CZXR  ECHO 10.4.2014 Consultations:  cardiology  Discharge Exam: Filed Vitals:   03/06/13 0613  BP: 129/55  Pulse: 68  Temp: 98.3 F (36.8 C)  Resp: 18    General: A&O x3 Cardiovascular: RRR Respiratory: good air movement CTA B/L  Discharge Instructions      Discharge Orders   Future Orders Complete By Expires   Diet - low sodium heart healthy  As directed    Increase activity slowly  As directed        Medication List    STOP taking these medications       furosemide 40 MG tablet  Commonly known as:  LASIX      TAKE these medications       albuterol (2.5 MG/3ML) 0.083% nebulizer solution  Commonly known as:  PROVENTIL  Take 2.5 mg by nebulization every 6 (six) hours as needed for wheezing.     albuterol 108 (  90 BASE) MCG/ACT inhaler  Commonly known as:  PROVENTIL HFA;VENTOLIN HFA  Inhale 2 puffs into the lungs every 6 (six) hours as needed for wheezing.     amLODipine-benazepril 10-20 MG per capsule  Commonly known as:  LOTREL  Take 1 capsule by mouth daily.     aspirin 81 MG tablet  Take 81 mg by mouth daily.     docusate sodium 100 MG capsule  Commonly known as:  COLACE  Take 200 mg by mouth at bedtime.     donepezil 10 MG tablet  Commonly known as:  ARICEPT  Take 10 mg by mouth at bedtime.     famotidine 20 MG tablet  Commonly known as:  PEPCID  Take 20 mg by mouth daily.     mirtazapine 30 MG tablet  Commonly known as:   REMERON  Take 30 mg by mouth at bedtime.     mometasone-formoterol 200-5 MCG/ACT Aero  Commonly known as:  DULERA  Inhale 2 puffs into the lungs 2 (two) times daily.     multivitamin with minerals Tabs tablet  Take 1 tablet by mouth daily.     oxybutynin 5 MG tablet  Commonly known as:  DITROPAN  Take 5 mg by mouth daily.     potassium chloride 10 MEQ tablet  Commonly known as:  K-DUR  Take 2 tablets (20 mEq total) by mouth 2 (two) times daily.     pravastatin 80 MG tablet  Commonly known as:  PRAVACHOL  Take 80 mg by mouth daily.     torsemide 20 MG tablet  Commonly known as:  DEMADEX  Take 1 tablet (20 mg total) by mouth 2 (two) times daily.     traMADol 50 MG tablet  Commonly known as:  ULTRAM  Take 50 mg by mouth 3 (three) times daily as needed for pain (Takes one tablet every morning and evening.).       Allergies  Allergen Reactions  . Erythromycin Other (See Comments)    Unknown   . Penicillins Other (See Comments)    Unknown    Follow-up Information   Follow up with FANTA,TESFAYE, MD In 1 week. (hospital follow up)    Specialty:  Internal Medicine   Contact information:   [address] [place] [address] [phone]       Follow up with  [facility] In 1 week. (hospital follow up)    Specialty:  Cardiology   Contact information:   [address] [id number] [place] [address] [phone]       The results of significant diagnostics from this hospitalization (including imaging, microbiology, ancillary and laboratory) are listed below for reference.    Significant Diagnostic Studies: Dg Chest 1 View  02/05/2013   *RADIOLOGY REPORT*  Clinical Data: Lethargic.  Weakness.  Question CHF.  CHEST - 1 VIEW  Comparison: 01/31/2013  Findings: Midline trachea.  Cardiomegaly accentuated by AP portable technique.  Suspect a small right pleural effusion. No pneumothorax.  Mild pulmonary venous  congestion, slightly improved. Accentuated by mildly low lung volumes.  Similar left and worsened right base aeration.  IMPRESSION: Slightly improved interstitial edema/venous congestion.  Suspect a small right pleural effusion with developing adjacent airspace disease, likely atelectasis.   Original Report Authenticated By: Jeronimo Greaves, M.D.   Dg Chest Port 1 View  03/01/2013   CLINICAL DATA:  COPD.  EXAM: PORTABLE CHEST - 1 VIEW  COMPARISON:  02/27/2013  FINDINGS: Cardiomegaly. Diffuse  interstitial and alveolar opacities throughout the lungs, likely edema. Bibasilar opacities likely reflect atelectasis. Small bilateral effusions. Findings similar to prior study.  IMPRESSION: Suspect mild edema/CHF. Small effusions and bibasilar atelectasis. No real change.   Electronically Signed   By: Charlett Nose M.D.   On: 03/01/2013 06:58   Dg Chest Port 1 View  02/27/2013   CLINICAL DATA:  Shortness of breath, fatigue.  EXAM: PORTABLE CHEST - 1 VIEW  COMPARISON:  02/05/2013  FINDINGS: Mild cardiomegaly. Diffuse interstitial prominence throughout the lungs is stable since prior study, likely mild pulmonary edema. Bibasilar atelectasis and small effusions.  IMPRESSION: Findings similar to prior study with diffuse interstitial prominence, bibasilar atelectasis and small effusions. Suspect mild interstitial edema.   Electronically Signed   By: Charlett Nose M.D.   On: 02/27/2013 22:20    Microbiology: Recent Results (from the past 240 hour(s))  URINE CULTURE     Status: None   Collection Time    02/28/13 12:47 AM      Result Value Range Status   Specimen Description URINE, CATHETERIZED   Final   Special Requests NONE   Final   Culture  Setup Time     Final   Value: 02/28/2013 10:30     Performed at [facility] Count     Final   Value: >=100,000 COLONIES/ML     Performed at [facility]   Culture     Final   Value: KLEBSIELLA PNEUMONIAE     Performed at [facility]    Report Status 03/02/2013 FINAL   Final   Organism ID, Bacteria KLEBSIELLA PNEUMONIAE   Final  URINE CULTURE     Status: None   Collection Time    02/28/13  4:16 AM      Result Value Range Status   Specimen Description URINE, RANDOM   Final   Special Requests CX ADDED AT 0605 ON [id number]   Final   Culture  Setup Time     Final   Value: 02/28/2013 06:08     Performed at [facility]   Colony Count     Final   Value: >=100,000 COLONIES/ML     Performed at [facility]   Culture     Final   Value: KLEBSIELLA PNEUMONIAE     Note: SUSCEPTIBILITIES PERFORMED ON PREVIOUS CULTURE WITHIN THE LAST 5 DAYS.     Performed at [facility]   Report Status 03/02/2013 FINAL   Final  MRSA PCR SCREENING     Status: None   Collection Time    02/28/13  6:54 AM      Result Value Range Status   MRSA by PCR NEGATIVE  NEGATIVE Final   Comment:            The GeneXpert MRSA Assay (FDA     approved for NASAL specimens     only), is one component of a     comprehensive MRSA colonization     surveillance program. It is not     intended to diagnose MRSA     infection nor to guide or     monitor treatment for     MRSA infections.  URINE CULTURE     Status: None   Collection Time    02/28/13  6:56 AM      Result Value Range Status   Specimen Description URINE, CATHETERIZED   Final   Special Requests NONE   Final  Culture  Setup Time     Final   Value: 02/28/2013 14:06     Performed at [facility] Count     Final   Value: >=100,000 COLONIES/ML     Performed at [facility]   Culture     Final   Value: KLEBSIELLA PNEUMONIAE     Performed at [facility]   Report Status 03/02/2013 FINAL   Final   Organism ID, Bacteria KLEBSIELLA PNEUMONIAE   Final     Labs: Basic Metabolic Panel:  Recent Labs Lab 03/03/13 0515 03/04/13 0455 03/05/13 0410 03/05/13 0930 03/06/13 0535  NA 140 147* 144 143 145  K 4.1 4.1 4.1 4.2 4.4  CL 100 102 101 98  101  CO2 35* 38* 38* 41* 38*  GLUCOSE 95 93 91 112* 96  BUN 37* 36* 34* 33* 32*  CREATININE 1.12* 1.14* 1.09 1.08 1.06  CALCIUM 8.6 8.8 9.0 9.2 9.0   Liver Function Tests: No results found for this basename: AST, ALT, ALKPHOS, BILITOT, PROT, ALBUMIN,  in the last 168 hours No results found for this basename: LIPASE, AMYLASE,  in the last 168 hours No results found for this basename: AMMONIA,  in the last 168 hours CBC:  Recent Labs Lab 02/27/13 2158 02/28/13 0336 02/28/13 0550 02/28/13 0945  WBC 7.3  --   --  8.6  NEUTROABS 5.0  --   --   --   HGB 11.1* 11.9* 12.2 11.8*  HCT 33.0* 35.0* 36.0 33.7*  MCV 79.7  --   --  77.3*  PLT 151  --   --  141*   Cardiac Enzymes:  Recent Labs Lab 02/27/13 2158  TROPONINI <0.30   BNP: BNP (last 3 results)  Recent Labs  02/04/13 0627 02/27/13 2158 03/05/13 0410  PROBNP 2586.0* 4992.0* 4765.0*   CBG:  Recent Labs Lab 03/05/13 0539 03/05/13 1150 03/05/13 1609 03/05/13 2133 03/06/13 0619  GLUCAP 93 110* 127* 117* 103*       Signed:  FELIZ ORTIZ, ABRAHAM  [facility] 03/06/2013, 7:27 AM

## 2013-03-05 NOTE — Progress Notes (Addendum)
[facility] PROGRESS NOTE Interim History: 79 y.o. female with hx of CHF, HTN, on Benazepril along with K supplements, DM, COPD, presented to [facility] ER with weakness. She was found to be in ARF with Cr of 2.5, K greater than 9.0 mEq/L with EKG showed widened QRS complex and symmetrically peaked Twaves. She was given Calcium Chloride, Insulin and Dextrose, along with 30 grams of Kayexalate. She was transferred subsequently to [facility] ER for further treatment. In the [facility] ER, she was found to have K of 8.0 mEq/L again with persistent widened QRS when compared to older EKG. She endorsed she has been feeling weaker recently.  Filed Weights   03/03/13 0527 03/04/13 0700 03/05/13 0700  Weight: 102.8 kg (226 lb 10.1 oz) 98.7 kg (217 lb 9.5 oz) 97.2 kg (214 lb 4.6 oz)    Intake/Output Summary (Last 24 hours) at 03/05/13 8295 Last data filed at 03/05/13 0700  Gross per 24 hour  Intake    480 ml  Output   1900 ml  Net  -1420 ml   Assessment/Plan: Acute respiratory failure with hypoxia/Pulmonary edema/ Acute diastolic heart failure: - Cont current dose of lasix. Change to oral in am.  - B-met daily monitor electrolytes. - Cont to be negative, weight decreasing. - Appreciate Cardiology assistance. Estimated dry weight 96 Kg. - still has + JVD  Acute on CKD (chronic kidney disease) stage 3, GFR 30-59 ml/min/Acute hyperkalemia/Cardiorenal syndrome with renal failure: - Renal duplex US no RAS. - Creatinine at baseline.  DEMENTIA, MILD - stable.   CEREBROVASCULAR ACCIDENT, HX OF  - cont ASA.  Code Status: full Family Communication: none  Disposition Plan: inpatient   Consultants:  Cardiology  renal  Procedures: ECHO 10.2.2014: showed EF 60% grade 2 diastolic HF.  Antibiotics:  none  HPI/Subjective: Feels better no complains.   Objective: Patient Vitals for the past 24 hrs:  BP Temp Temp src Pulse Resp SpO2 Weight  03/05/13 0700 128/52 mmHg 98 F (36.7 C) Oral 66 20 93 %  97.2 kg (214 lb 4.6 oz)  03/04/13 2100 141/61 mmHg 98.2 F (36.8 C) Oral 66 22 92 % -  03/04/13 2053 - - - - - 98 % -  03/04/13 1300 134/49 mmHg 98 F (36.7 C) Oral 70 20 99 % -  03/04/13 0845 - - - - - 95 % -     Exam:  General: Alert, awake, oriented x3, in no acute distress.  HEENT: No bruits, no goiter. JVD Heart: Regular rate and rhythm, without murmurs, rubs, gallops.  Lungs: Good air movement, bilateral air movement.     Data Reviewed: Basic Metabolic Panel:  Recent Labs Lab 03/01/13 0030 03/01/13 0415 03/01/13 0800 03/02/13 0450 03/03/13 0515  NA 140 139 140 141 140  K 4.3 3.9 3.9 3.4* 4.1  CL 100 98 100 97 100  CO2 32 35* 33* 33* 35*  GLUCOSE 111* 90 88 90 95  BUN 49* 47* 45* 40* 37*  CREATININE 1.85* 1.80* 1.64* 1.31* 1.12*  CALCIUM 9.3 9.4 9.2 9.1 8.6   Liver Function Tests: No results found for this basename: AST, ALT, ALKPHOS, BILITOT, PROT, ALBUMIN,  in the last 168 hours No results found for this basename: LIPASE, AMYLASE,  in the last 168 hours No results found for this basename: AMMONIA,  in the last 168 hours CBC:  Recent Labs Lab 02/27/13 2158 02/28/13 0336 02/28/13 0550 02/28/13 0945  WBC 7.3  --   --  8.6  NEUTROABS 5.0  --   --   --  HGB 11.1* 11.9* 12.2 11.8*  HCT 33.0* 35.0* 36.0 33.7*  MCV 79.7  --   --  77.3*  PLT 151  --   --  141*   Cardiac Enzymes:  Recent Labs Lab 02/27/13 2158  TROPONINI <0.30   BNP (last 3 results)  Recent Labs  01/31/13 1230 02/04/13 0627 02/27/13 2158  PROBNP 4720.0* 2586.0* 4992.0*   CBG:  Recent Labs Lab 03/02/13 0602 03/02/13 1135 03/02/13 1600 03/02/13 2125 03/03/13 0559  GLUCAP 88 109* 114* 132* 108*    Recent Results (from the past 240 hour(s))  URINE CULTURE     Status: None   Collection Time    02/28/13 12:47 AM      Result Value Range Status   Specimen Description URINE, CATHETERIZED   Final   Special Requests NONE   Final   Culture  Setup Time     Final   Value:  02/28/2013 10:30     Performed at [facility] Count     Final   Value: >=100,000 COLONIES/ML     Performed at [facility]   Culture     Final   Value: KLEBSIELLA PNEUMONIAE     Performed at [facility]   Report Status 03/02/2013 FINAL   Final   Organism ID, Bacteria KLEBSIELLA PNEUMONIAE   Final  URINE CULTURE     Status: None   Collection Time    02/28/13  4:16 AM      Result Value Range Status   Specimen Description URINE, RANDOM   Final   Special Requests CX ADDED AT 0605 ON [id number]   Final   Culture  Setup Time     Final   Value: 02/28/2013 06:08     Performed at [facility]   Colony Count     Final   Value: >=100,000 COLONIES/ML     Performed at [facility]   Culture     Final   Value: KLEBSIELLA PNEUMONIAE     Note: SUSCEPTIBILITIES PERFORMED ON PREVIOUS CULTURE WITHIN THE LAST 5 DAYS.     Performed at [facility]   Report Status 03/02/2013 FINAL   Final  MRSA PCR SCREENING     Status: None   Collection Time    02/28/13  6:54 AM      Result Value Range Status   MRSA by PCR NEGATIVE  NEGATIVE Final   Comment:            The GeneXpert MRSA Assay (FDA     approved for NASAL specimens     only), is one component of a     comprehensive MRSA colonization     surveillance program. It is not     intended to diagnose MRSA     infection nor to guide or     monitor treatment for     MRSA infections.  URINE CULTURE     Status: None   Collection Time    02/28/13  6:56 AM      Result Value Range Status   Specimen Description URINE, CATHETERIZED   Final   Special Requests NONE   Final   Culture  Setup Time     Final   Value: 02/28/2013 14:06     Performed at [facility] Count     Final   Value: >=100,000 COLONIES/ML     Performed at [facility]  Final   Value: KLEBSIELLA PNEUMONIAE     Performed at [facility]   Report Status 03/02/2013 FINAL   Final    Organism ID, Bacteria KLEBSIELLA PNEUMONIAE   Final     Studies: No results found.  Scheduled Meds: . amLODipine  10 mg Oral Daily  . antiseptic oral rinse  15 mL Mouth Rinse BID  . aspirin EC  81 mg Oral Daily  . donepezil  10 mg Oral QHS  . famotidine  20 mg Oral Daily  . heparin  5,000 Units Subcutaneous Q8H  . mirtazapine  30 mg Oral QHS  . mometasone-formoterol  2 puff Inhalation BID  . multivitamin with minerals  1 tablet Oral Daily  . oxybutynin  5 mg Oral Daily  . potassium chloride  20 mEq Oral BID  . pravastatin  80 mg Oral q1800  . sodium chloride  3 mL Intravenous Q12H   Continuous Infusions:    Marinda Elk  [facility] Pager [phone]. If 8PM-8AM, please contact night-coverage at [email or web address], password [facility] 03/03/2013, 8:53 AM  LOS: 4 days

## 2013-03-06 DIAGNOSIS — J42 Unspecified chronic bronchitis: Secondary | ICD-10-CM | POA: Diagnosis not present

## 2013-03-06 DIAGNOSIS — G459 Transient cerebral ischemic attack, unspecified: Secondary | ICD-10-CM | POA: Diagnosis not present

## 2013-03-06 DIAGNOSIS — J9600 Acute respiratory failure, unspecified whether with hypoxia or hypercapnia: Secondary | ICD-10-CM | POA: Diagnosis not present

## 2013-03-06 DIAGNOSIS — R0902 Hypoxemia: Secondary | ICD-10-CM | POA: Diagnosis not present

## 2013-03-06 DIAGNOSIS — E875 Hyperkalemia: Secondary | ICD-10-CM | POA: Diagnosis not present

## 2013-03-06 DIAGNOSIS — I5031 Acute diastolic (congestive) heart failure: Secondary | ICD-10-CM | POA: Diagnosis not present

## 2013-03-06 DIAGNOSIS — N19 Unspecified kidney failure: Secondary | ICD-10-CM | POA: Diagnosis not present

## 2013-03-06 DIAGNOSIS — I10 Essential (primary) hypertension: Secondary | ICD-10-CM | POA: Diagnosis not present

## 2013-03-06 DIAGNOSIS — I6789 Other cerebrovascular disease: Secondary | ICD-10-CM | POA: Diagnosis not present

## 2013-03-06 DIAGNOSIS — N179 Acute kidney failure, unspecified: Secondary | ICD-10-CM | POA: Diagnosis not present

## 2013-03-06 DIAGNOSIS — I509 Heart failure, unspecified: Secondary | ICD-10-CM | POA: Diagnosis not present

## 2013-03-06 DIAGNOSIS — F0390 Unspecified dementia without behavioral disturbance: Secondary | ICD-10-CM | POA: Diagnosis not present

## 2013-03-06 DIAGNOSIS — R262 Difficulty in walking, not elsewhere classified: Secondary | ICD-10-CM | POA: Diagnosis not present

## 2013-03-06 DIAGNOSIS — M6281 Muscle weakness (generalized): Secondary | ICD-10-CM | POA: Diagnosis not present

## 2013-03-06 DIAGNOSIS — I5033 Acute on chronic diastolic (congestive) heart failure: Secondary | ICD-10-CM | POA: Diagnosis not present

## 2013-03-06 DIAGNOSIS — Z111 Encounter for screening for respiratory tuberculosis: Secondary | ICD-10-CM | POA: Diagnosis not present

## 2013-03-06 LAB — BASIC METABOLIC PANEL
CO2: 38 mEq/L — ABNORMAL HIGH (ref 19–32)
Calcium: 9 mg/dL (ref 8.4–10.5)
Creatinine, Ser: 1.06 mg/dL (ref 0.50–1.10)
GFR calc Af Amer: 56 mL/min — ABNORMAL LOW (ref >90)
Glucose, Bld: 96 mg/dL (ref 70–99)

## 2013-03-06 LAB — GLUCOSE, CAPILLARY

## 2013-03-06 MED ORDER — TORSEMIDE 20 MG PO TABS
20.0000 mg | ORAL_TABLET | Freq: Two times a day (BID) | ORAL | Status: DC
  Administered 2013-03-06: 20 mg via ORAL
  Filled 2013-03-06 (×3): qty 1

## 2013-03-06 MED ORDER — FUROSEMIDE 10 MG/ML IJ SOLN
60.0000 mg | Freq: Once | INTRAMUSCULAR | Status: AC
  Administered 2013-03-06: 10:00:00 60 mg via INTRAVENOUS

## 2013-03-06 MED ORDER — TORSEMIDE 20 MG PO TABS: 20.0000 mg | ORAL_TABLET | Freq: Two times a day (BID) | ORAL | Status: DC

## 2013-03-06 NOTE — Clinical Social Work Placement (Signed)
     Clinical Social Work Department CLINICAL SOCIAL WORK PLACEMENT NOTE 03/06/2013  Patient:  SPRING, SAN  Account Number:  [id number] Admit date:  02/27/2013  Clinical Social Worker:  Sharol Harness, Theresia Majors  Date/time:  03/02/2013 04:00 PM  Clinical Social Work is seeking post-discharge placement for this patient at the following level of care:   SKILLED NURSING   (*CSW will update this form in Epic as items are completed)   03/02/2013  Patient/family provided with [facility] Department of Clinical Social Works list of facilities offering this level of care within the geographic area requested by the patient (or if unable, by the patients family).  03/02/2013  Patient/family informed of their freedom to choose among providers that offer the needed level of care, that participate in Medicare, Medicaid or managed care program needed by the patient, have an available bed and are willing to accept the patient.  03/02/2013  Patient/family informed of [facility] ownership interest in [facility], as well as of the fact that they are under no obligation to receive care at this facility.  PASARR submitted to EDS on 03/02/2013 PASARR number received from EDS on 03/02/2013  FL2 transmitted to all facilities in geographic area requested by pt/family on  03/02/2013 FL2 transmitted to all facilities within larger geographic area on   Patient informed that his/her managed care company has contracts with or will negotiate with  certain facilities, including the following:   NA     Patient/family informed of bed offers received:  03/03/2013 Patient chooses bed at [facility] [place] Physician recommends and patient chooses bed at    Patient to be transferred to [facility] [place] on  03/06/2013 Patient to be transferred to facility by Ambulance  Sharin Mons)  The following physician request were entered in Epic:   Additional Comments: 03/06/13  OK per MD for d/c today to  SNF. Patient is pleased with d/c plan and does not want to return to ALF. Increased level of care is indicated.  CSW notified RN and facility.  No further CSW needs identified.  CSW signing off.

## 2013-03-06 NOTE — Progress Notes (Signed)
Patient's IV and telemetry has been discontinued, patient is being discharged to [facility], report has been given to nurse Sama at the facilty and discharged instructions has been sent with patient to facility. Lorretta Harp RN

## 2013-03-06 NOTE — Progress Notes (Signed)
Advanced Heart Failure Rounding Note  Referring Physician: Dr. Tyson Alias  Primary Physician: Dr. Felecia Shelling  Reason for Consultation: Diastolic HF  HPI:    Brittney Li is an 79 y.o. female with hx of morbid obesity, diastolic HF EF 60-65% (01/2013), HTN, DM, and COPD. She resides in assisted living at [facility] in [place].   03/01/13 ECHO EF 50-55%  Over the weekend she was diuresed with IV lasix. Overall she has diuresed 14 pounds. Denies SOB/PND/Orthopnea.   Objective:   Weight Range:  Vital Signs:   Temp:  [97.9 F (36.6 C)-98.4 F (36.9 C)] 98.3 F (36.8 C) (10/06 1610) Pulse Rate:  [64-74] 68 (10/06 0613) Resp:  [18-20] 18 (10/06 9604) BP: (121-129)/(54-77) 129/55 mmHg (10/06 0613) SpO2:  [95 %-98 %] 97 % (10/06 5409) Weight:  [213 lb 10 oz (96.9 kg)] 213 lb 10 oz (96.9 kg) (10/06 0613) Last BM Date: 03/05/13  Weight change: Filed Weights   03/04/13 0700 03/05/13 0700 03/06/13 0613  Weight: 217 lb 9.5 oz (98.7 kg) 214 lb 4.6 oz (97.2 kg) 213 lb 10 oz (96.9 kg)    Intake/Output:   Intake/Output Summary (Last 24 hours) at 03/06/13 8119 Last data filed at 03/06/13 0615  Gross per 24 hour  Intake    540 ml  Output   1375 ml  Net   -835 ml     Physical Exam:  General: Chronically ill appearing. No resp difficulty  HEENT: normal  Neck: supple. JVP 7-8 cm. Carotids 2+ bilat; no bruits. No lymphadenopathy or thryomegaly appreciated.  Cor: PMI nondisplaced. Regular rate & rhythm. No rubs, gallops or murmurs.  Lungs: clear  Abdomen: obese, soft, nontender, nondistended. No hepatosplenomegaly. No bruits or masses. Good bowel sounds.  Extremities: no cyanosis, clubbing, rash, Rand LLE trace edema bilateral ted hose.   Neuro: alert & orientedx3, cranial nerves grossly intact. moves all 4 extremities w/o difficulty. Affect pleasant   Telemetry: SR 70s   Labs: Basic Metabolic Panel:  Recent Labs Lab 03/03/13 0515 03/04/13 0455 03/05/13 0410 03/05/13 0930  03/06/13 0535  NA 140 147* 144 143 145  K 4.1 4.1 4.1 4.2 4.4  CL 100 102 101 98 101  CO2 35* 38* 38* 41* 38*  GLUCOSE 95 93 91 112* 96  BUN 37* 36* 34* 33* 32*  CREATININE 1.12* 1.14* 1.09 1.08 1.06  CALCIUM 8.6 8.8 9.0 9.2 9.0    Liver Function Tests: No results found for this basename: AST, ALT, ALKPHOS, BILITOT, PROT, ALBUMIN,  in the last 168 hours No results found for this basename: LIPASE, AMYLASE,  in the last 168 hours No results found for this basename: AMMONIA,  in the last 168 hours  CBC:  Recent Labs Lab 02/27/13 2158 02/28/13 0336 02/28/13 0550 02/28/13 0945  WBC 7.3  --   --  8.6  NEUTROABS 5.0  --   --   --   HGB 11.1* 11.9* 12.2 11.8*  HCT 33.0* 35.0* 36.0 33.7*  MCV 79.7  --   --  77.3*  PLT 151  --   --  141*    Cardiac Enzymes:  Recent Labs Lab 02/27/13 2158  TROPONINI <0.30    BNP: BNP (last 3 results)  Recent Labs  02/04/13 0627 02/27/13 2158 03/05/13 0410  PROBNP 2586.0* 4992.0* 4765.0*     Other results:    Imaging: No results found.   Medications:     Scheduled Medications: . amLODipine  10 mg Oral Daily  . antiseptic oral rinse  15 mL Mouth Rinse BID  . aspirin EC  81 mg Oral Daily  . donepezil  10 mg Oral QHS  . famotidine  20 mg Oral Daily  . furosemide  60 mg Intravenous TID  . heparin  5,000 Units Subcutaneous Q8H  . mirtazapine  30 mg Oral QHS  . mometasone-formoterol  2 puff Inhalation BID  . multivitamin with minerals  1 tablet Oral Daily  . oxybutynin  5 mg Oral Daily  . potassium chloride  20 mEq Oral BID  . pravastatin  80 mg Oral q1800  . sodium chloride  3 mL Intravenous Q12H    Infusions:    PRN Medications: albuterol, albuterol   Assessment:    1) Acute renal failure, baseline 0.9  2) Acute on chronic diastolic HF 03/01/13 ECHO EF 50-55% 3) Cardio renal syndrome  4) HTN  5) DM  6) COPD    Plan/Discussion:    Volume status much improved. Overall weight down 14 pounds. Stop IV  lasix after am dose and transition to 20 mg of torsemide twice a day.   Ace was stopped on admit due to hyperkalemia and elevated creatinine. Will consider low dose ace in outpatient follow up.   Will Heart Failure SNF orders. Daily weights, low salt diet, and < 2 liters of fluid per day. Will need BMET next week and follow up in HF clinic.   Length of Stay: 7   Advanced Heart Failure Team Pager [phone] (M-F; 7a - 4p)  Please contact [facility] for night-coverage after hours (4p -7a ) and weekends on [email or web address]  Patient seen with NP, agree with the above note.  Patient is doing well, edema is down considerably.  She walked yesterday 3 times.  I think that she can go home today on torsemide 20 mg bid.  We will see in clinic in followup.   Marca Ancona 03/06/2013 8:53 AM

## 2013-03-08 DIAGNOSIS — I6789 Other cerebrovascular disease: Secondary | ICD-10-CM | POA: Diagnosis not present

## 2013-03-08 DIAGNOSIS — N19 Unspecified kidney failure: Secondary | ICD-10-CM | POA: Diagnosis not present

## 2013-03-08 DIAGNOSIS — E875 Hyperkalemia: Secondary | ICD-10-CM | POA: Diagnosis not present

## 2013-03-08 DIAGNOSIS — I509 Heart failure, unspecified: Secondary | ICD-10-CM | POA: Diagnosis not present

## 2013-03-13 ENCOUNTER — Encounter (HOSPITAL_COMMUNITY): Payer: Medicare Other

## 2013-04-05 DIAGNOSIS — I509 Heart failure, unspecified: Secondary | ICD-10-CM | POA: Diagnosis not present

## 2013-04-05 DIAGNOSIS — E875 Hyperkalemia: Secondary | ICD-10-CM | POA: Diagnosis not present

## 2013-04-05 DIAGNOSIS — N19 Unspecified kidney failure: Secondary | ICD-10-CM | POA: Diagnosis not present

## 2013-04-05 DIAGNOSIS — I6789 Other cerebrovascular disease: Secondary | ICD-10-CM | POA: Diagnosis not present

## 2013-04-19 DIAGNOSIS — J9600 Acute respiratory failure, unspecified whether with hypoxia or hypercapnia: Secondary | ICD-10-CM | POA: Diagnosis not present

## 2013-04-19 DIAGNOSIS — M6281 Muscle weakness (generalized): Secondary | ICD-10-CM | POA: Diagnosis not present

## 2013-04-19 DIAGNOSIS — J441 Chronic obstructive pulmonary disease with (acute) exacerbation: Secondary | ICD-10-CM | POA: Diagnosis not present

## 2013-04-19 DIAGNOSIS — I509 Heart failure, unspecified: Secondary | ICD-10-CM | POA: Diagnosis not present

## 2013-04-19 DIAGNOSIS — E119 Type 2 diabetes mellitus without complications: Secondary | ICD-10-CM | POA: Diagnosis not present

## 2013-04-19 DIAGNOSIS — F0390 Unspecified dementia without behavioral disturbance: Secondary | ICD-10-CM | POA: Diagnosis not present

## 2013-04-19 DIAGNOSIS — Z9981 Dependence on supplemental oxygen: Secondary | ICD-10-CM | POA: Diagnosis not present

## 2013-04-19 DIAGNOSIS — I5031 Acute diastolic (congestive) heart failure: Secondary | ICD-10-CM | POA: Diagnosis not present

## 2013-04-19 DIAGNOSIS — R262 Difficulty in walking, not elsewhere classified: Secondary | ICD-10-CM | POA: Diagnosis not present

## 2013-04-19 DIAGNOSIS — Z8673 Personal history of transient ischemic attack (TIA), and cerebral infarction without residual deficits: Secondary | ICD-10-CM | POA: Diagnosis not present

## 2013-04-19 DIAGNOSIS — I1310 Hypertensive heart and chronic kidney disease without heart failure, with stage 1 through stage 4 chronic kidney disease, or unspecified chronic kidney disease: Secondary | ICD-10-CM | POA: Diagnosis not present

## 2013-04-19 DIAGNOSIS — IMO0001 Reserved for inherently not codable concepts without codable children: Secondary | ICD-10-CM | POA: Diagnosis not present

## 2013-04-25 DIAGNOSIS — R262 Difficulty in walking, not elsewhere classified: Secondary | ICD-10-CM | POA: Diagnosis not present

## 2013-04-25 DIAGNOSIS — M6281 Muscle weakness (generalized): Secondary | ICD-10-CM | POA: Diagnosis not present

## 2013-04-25 DIAGNOSIS — I1310 Hypertensive heart and chronic kidney disease without heart failure, with stage 1 through stage 4 chronic kidney disease, or unspecified chronic kidney disease: Secondary | ICD-10-CM | POA: Diagnosis not present

## 2013-04-25 DIAGNOSIS — IMO0001 Reserved for inherently not codable concepts without codable children: Secondary | ICD-10-CM | POA: Diagnosis not present

## 2013-04-25 DIAGNOSIS — E119 Type 2 diabetes mellitus without complications: Secondary | ICD-10-CM | POA: Diagnosis not present

## 2013-04-25 DIAGNOSIS — J441 Chronic obstructive pulmonary disease with (acute) exacerbation: Secondary | ICD-10-CM | POA: Diagnosis not present

## 2013-05-04 DIAGNOSIS — I6789 Other cerebrovascular disease: Secondary | ICD-10-CM | POA: Diagnosis not present

## 2013-05-04 DIAGNOSIS — I1310 Hypertensive heart and chronic kidney disease without heart failure, with stage 1 through stage 4 chronic kidney disease, or unspecified chronic kidney disease: Secondary | ICD-10-CM | POA: Diagnosis not present

## 2013-05-04 DIAGNOSIS — E119 Type 2 diabetes mellitus without complications: Secondary | ICD-10-CM | POA: Diagnosis not present

## 2013-05-04 DIAGNOSIS — IMO0001 Reserved for inherently not codable concepts without codable children: Secondary | ICD-10-CM | POA: Diagnosis not present

## 2013-05-04 DIAGNOSIS — M6281 Muscle weakness (generalized): Secondary | ICD-10-CM | POA: Diagnosis not present

## 2013-05-04 DIAGNOSIS — J441 Chronic obstructive pulmonary disease with (acute) exacerbation: Secondary | ICD-10-CM | POA: Diagnosis not present

## 2013-05-04 DIAGNOSIS — R262 Difficulty in walking, not elsewhere classified: Secondary | ICD-10-CM | POA: Diagnosis not present

## 2013-05-05 DIAGNOSIS — R262 Difficulty in walking, not elsewhere classified: Secondary | ICD-10-CM | POA: Diagnosis not present

## 2013-05-05 DIAGNOSIS — J441 Chronic obstructive pulmonary disease with (acute) exacerbation: Secondary | ICD-10-CM | POA: Diagnosis not present

## 2013-05-05 DIAGNOSIS — M6281 Muscle weakness (generalized): Secondary | ICD-10-CM | POA: Diagnosis not present

## 2013-05-05 DIAGNOSIS — I1310 Hypertensive heart and chronic kidney disease without heart failure, with stage 1 through stage 4 chronic kidney disease, or unspecified chronic kidney disease: Secondary | ICD-10-CM | POA: Diagnosis not present

## 2013-05-05 DIAGNOSIS — E119 Type 2 diabetes mellitus without complications: Secondary | ICD-10-CM | POA: Diagnosis not present

## 2013-05-05 DIAGNOSIS — IMO0001 Reserved for inherently not codable concepts without codable children: Secondary | ICD-10-CM | POA: Diagnosis not present

## 2013-05-08 DIAGNOSIS — M6281 Muscle weakness (generalized): Secondary | ICD-10-CM | POA: Diagnosis not present

## 2013-05-08 DIAGNOSIS — I1310 Hypertensive heart and chronic kidney disease without heart failure, with stage 1 through stage 4 chronic kidney disease, or unspecified chronic kidney disease: Secondary | ICD-10-CM | POA: Diagnosis not present

## 2013-05-08 DIAGNOSIS — IMO0001 Reserved for inherently not codable concepts without codable children: Secondary | ICD-10-CM | POA: Diagnosis not present

## 2013-05-08 DIAGNOSIS — E119 Type 2 diabetes mellitus without complications: Secondary | ICD-10-CM | POA: Diagnosis not present

## 2013-05-08 DIAGNOSIS — J441 Chronic obstructive pulmonary disease with (acute) exacerbation: Secondary | ICD-10-CM | POA: Diagnosis not present

## 2013-05-08 DIAGNOSIS — R262 Difficulty in walking, not elsewhere classified: Secondary | ICD-10-CM | POA: Diagnosis not present

## 2013-05-10 DIAGNOSIS — IMO0001 Reserved for inherently not codable concepts without codable children: Secondary | ICD-10-CM | POA: Diagnosis not present

## 2013-05-10 DIAGNOSIS — M6281 Muscle weakness (generalized): Secondary | ICD-10-CM | POA: Diagnosis not present

## 2013-05-10 DIAGNOSIS — I1310 Hypertensive heart and chronic kidney disease without heart failure, with stage 1 through stage 4 chronic kidney disease, or unspecified chronic kidney disease: Secondary | ICD-10-CM | POA: Diagnosis not present

## 2013-05-10 DIAGNOSIS — E119 Type 2 diabetes mellitus without complications: Secondary | ICD-10-CM | POA: Diagnosis not present

## 2013-05-10 DIAGNOSIS — R262 Difficulty in walking, not elsewhere classified: Secondary | ICD-10-CM | POA: Diagnosis not present

## 2013-05-10 DIAGNOSIS — J441 Chronic obstructive pulmonary disease with (acute) exacerbation: Secondary | ICD-10-CM | POA: Diagnosis not present

## 2013-05-15 DIAGNOSIS — M6281 Muscle weakness (generalized): Secondary | ICD-10-CM | POA: Diagnosis not present

## 2013-05-15 DIAGNOSIS — E119 Type 2 diabetes mellitus without complications: Secondary | ICD-10-CM | POA: Diagnosis not present

## 2013-05-15 DIAGNOSIS — R262 Difficulty in walking, not elsewhere classified: Secondary | ICD-10-CM | POA: Diagnosis not present

## 2013-05-15 DIAGNOSIS — I1310 Hypertensive heart and chronic kidney disease without heart failure, with stage 1 through stage 4 chronic kidney disease, or unspecified chronic kidney disease: Secondary | ICD-10-CM | POA: Diagnosis not present

## 2013-05-15 DIAGNOSIS — J441 Chronic obstructive pulmonary disease with (acute) exacerbation: Secondary | ICD-10-CM | POA: Diagnosis not present

## 2013-05-15 DIAGNOSIS — IMO0001 Reserved for inherently not codable concepts without codable children: Secondary | ICD-10-CM | POA: Diagnosis not present

## 2013-05-19 DIAGNOSIS — E119 Type 2 diabetes mellitus without complications: Secondary | ICD-10-CM | POA: Diagnosis not present

## 2013-05-19 DIAGNOSIS — R262 Difficulty in walking, not elsewhere classified: Secondary | ICD-10-CM | POA: Diagnosis not present

## 2013-05-19 DIAGNOSIS — IMO0001 Reserved for inherently not codable concepts without codable children: Secondary | ICD-10-CM | POA: Diagnosis not present

## 2013-05-19 DIAGNOSIS — J441 Chronic obstructive pulmonary disease with (acute) exacerbation: Secondary | ICD-10-CM | POA: Diagnosis not present

## 2013-05-19 DIAGNOSIS — M6281 Muscle weakness (generalized): Secondary | ICD-10-CM | POA: Diagnosis not present

## 2013-05-19 DIAGNOSIS — I1310 Hypertensive heart and chronic kidney disease without heart failure, with stage 1 through stage 4 chronic kidney disease, or unspecified chronic kidney disease: Secondary | ICD-10-CM | POA: Diagnosis not present

## 2013-05-23 DIAGNOSIS — J441 Chronic obstructive pulmonary disease with (acute) exacerbation: Secondary | ICD-10-CM | POA: Diagnosis not present

## 2013-05-23 DIAGNOSIS — IMO0001 Reserved for inherently not codable concepts without codable children: Secondary | ICD-10-CM | POA: Diagnosis not present

## 2013-05-23 DIAGNOSIS — R262 Difficulty in walking, not elsewhere classified: Secondary | ICD-10-CM | POA: Diagnosis not present

## 2013-05-23 DIAGNOSIS — I1310 Hypertensive heart and chronic kidney disease without heart failure, with stage 1 through stage 4 chronic kidney disease, or unspecified chronic kidney disease: Secondary | ICD-10-CM | POA: Diagnosis not present

## 2013-05-23 DIAGNOSIS — E119 Type 2 diabetes mellitus without complications: Secondary | ICD-10-CM | POA: Diagnosis not present

## 2013-05-23 DIAGNOSIS — M6281 Muscle weakness (generalized): Secondary | ICD-10-CM | POA: Diagnosis not present

## 2013-05-29 DIAGNOSIS — J441 Chronic obstructive pulmonary disease with (acute) exacerbation: Secondary | ICD-10-CM | POA: Diagnosis not present

## 2013-05-29 DIAGNOSIS — I1310 Hypertensive heart and chronic kidney disease without heart failure, with stage 1 through stage 4 chronic kidney disease, or unspecified chronic kidney disease: Secondary | ICD-10-CM | POA: Diagnosis not present

## 2013-05-29 DIAGNOSIS — IMO0001 Reserved for inherently not codable concepts without codable children: Secondary | ICD-10-CM | POA: Diagnosis not present

## 2013-05-29 DIAGNOSIS — E119 Type 2 diabetes mellitus without complications: Secondary | ICD-10-CM | POA: Diagnosis not present

## 2013-05-29 DIAGNOSIS — R262 Difficulty in walking, not elsewhere classified: Secondary | ICD-10-CM | POA: Diagnosis not present

## 2013-05-29 DIAGNOSIS — M6281 Muscle weakness (generalized): Secondary | ICD-10-CM | POA: Diagnosis not present

## 2013-05-31 DIAGNOSIS — J441 Chronic obstructive pulmonary disease with (acute) exacerbation: Secondary | ICD-10-CM | POA: Diagnosis not present

## 2013-05-31 DIAGNOSIS — I1310 Hypertensive heart and chronic kidney disease without heart failure, with stage 1 through stage 4 chronic kidney disease, or unspecified chronic kidney disease: Secondary | ICD-10-CM | POA: Diagnosis not present

## 2013-05-31 DIAGNOSIS — E119 Type 2 diabetes mellitus without complications: Secondary | ICD-10-CM | POA: Diagnosis not present

## 2013-05-31 DIAGNOSIS — IMO0001 Reserved for inherently not codable concepts without codable children: Secondary | ICD-10-CM | POA: Diagnosis not present

## 2013-05-31 DIAGNOSIS — R262 Difficulty in walking, not elsewhere classified: Secondary | ICD-10-CM | POA: Diagnosis not present

## 2013-05-31 DIAGNOSIS — M6281 Muscle weakness (generalized): Secondary | ICD-10-CM | POA: Diagnosis not present

## 2013-06-02 DIAGNOSIS — J441 Chronic obstructive pulmonary disease with (acute) exacerbation: Secondary | ICD-10-CM | POA: Diagnosis not present

## 2013-06-02 DIAGNOSIS — R262 Difficulty in walking, not elsewhere classified: Secondary | ICD-10-CM | POA: Diagnosis not present

## 2013-06-02 DIAGNOSIS — I509 Heart failure, unspecified: Secondary | ICD-10-CM | POA: Diagnosis not present

## 2013-06-02 DIAGNOSIS — M6281 Muscle weakness (generalized): Secondary | ICD-10-CM | POA: Diagnosis not present

## 2013-06-02 DIAGNOSIS — I1310 Hypertensive heart and chronic kidney disease without heart failure, with stage 1 through stage 4 chronic kidney disease, or unspecified chronic kidney disease: Secondary | ICD-10-CM | POA: Diagnosis not present

## 2013-06-02 DIAGNOSIS — E119 Type 2 diabetes mellitus without complications: Secondary | ICD-10-CM | POA: Diagnosis not present

## 2013-06-02 DIAGNOSIS — IMO0001 Reserved for inherently not codable concepts without codable children: Secondary | ICD-10-CM | POA: Diagnosis not present

## 2013-06-05 DIAGNOSIS — E119 Type 2 diabetes mellitus without complications: Secondary | ICD-10-CM | POA: Diagnosis not present

## 2013-06-05 DIAGNOSIS — R262 Difficulty in walking, not elsewhere classified: Secondary | ICD-10-CM | POA: Diagnosis not present

## 2013-06-05 DIAGNOSIS — M6281 Muscle weakness (generalized): Secondary | ICD-10-CM | POA: Diagnosis not present

## 2013-06-05 DIAGNOSIS — IMO0001 Reserved for inherently not codable concepts without codable children: Secondary | ICD-10-CM | POA: Diagnosis not present

## 2013-06-05 DIAGNOSIS — I1310 Hypertensive heart and chronic kidney disease without heart failure, with stage 1 through stage 4 chronic kidney disease, or unspecified chronic kidney disease: Secondary | ICD-10-CM | POA: Diagnosis not present

## 2013-06-05 DIAGNOSIS — J441 Chronic obstructive pulmonary disease with (acute) exacerbation: Secondary | ICD-10-CM | POA: Diagnosis not present

## 2013-06-06 DIAGNOSIS — J441 Chronic obstructive pulmonary disease with (acute) exacerbation: Secondary | ICD-10-CM | POA: Diagnosis not present

## 2013-06-06 DIAGNOSIS — E119 Type 2 diabetes mellitus without complications: Secondary | ICD-10-CM | POA: Diagnosis not present

## 2013-06-06 DIAGNOSIS — M6281 Muscle weakness (generalized): Secondary | ICD-10-CM | POA: Diagnosis not present

## 2013-06-06 DIAGNOSIS — IMO0001 Reserved for inherently not codable concepts without codable children: Secondary | ICD-10-CM | POA: Diagnosis not present

## 2013-06-06 DIAGNOSIS — R262 Difficulty in walking, not elsewhere classified: Secondary | ICD-10-CM | POA: Diagnosis not present

## 2013-06-06 DIAGNOSIS — I1310 Hypertensive heart and chronic kidney disease without heart failure, with stage 1 through stage 4 chronic kidney disease, or unspecified chronic kidney disease: Secondary | ICD-10-CM | POA: Diagnosis not present

## 2013-06-07 DIAGNOSIS — J441 Chronic obstructive pulmonary disease with (acute) exacerbation: Secondary | ICD-10-CM | POA: Diagnosis not present

## 2013-06-07 DIAGNOSIS — R262 Difficulty in walking, not elsewhere classified: Secondary | ICD-10-CM | POA: Diagnosis not present

## 2013-06-07 DIAGNOSIS — E119 Type 2 diabetes mellitus without complications: Secondary | ICD-10-CM | POA: Diagnosis not present

## 2013-06-07 DIAGNOSIS — I1310 Hypertensive heart and chronic kidney disease without heart failure, with stage 1 through stage 4 chronic kidney disease, or unspecified chronic kidney disease: Secondary | ICD-10-CM | POA: Diagnosis not present

## 2013-06-07 DIAGNOSIS — IMO0001 Reserved for inherently not codable concepts without codable children: Secondary | ICD-10-CM | POA: Diagnosis not present

## 2013-06-07 DIAGNOSIS — M6281 Muscle weakness (generalized): Secondary | ICD-10-CM | POA: Diagnosis not present

## 2013-06-08 DIAGNOSIS — J441 Chronic obstructive pulmonary disease with (acute) exacerbation: Secondary | ICD-10-CM | POA: Diagnosis not present

## 2013-06-08 DIAGNOSIS — I1310 Hypertensive heart and chronic kidney disease without heart failure, with stage 1 through stage 4 chronic kidney disease, or unspecified chronic kidney disease: Secondary | ICD-10-CM | POA: Diagnosis not present

## 2013-06-08 DIAGNOSIS — M6281 Muscle weakness (generalized): Secondary | ICD-10-CM | POA: Diagnosis not present

## 2013-06-08 DIAGNOSIS — R262 Difficulty in walking, not elsewhere classified: Secondary | ICD-10-CM | POA: Diagnosis not present

## 2013-06-08 DIAGNOSIS — E119 Type 2 diabetes mellitus without complications: Secondary | ICD-10-CM | POA: Diagnosis not present

## 2013-06-08 DIAGNOSIS — IMO0001 Reserved for inherently not codable concepts without codable children: Secondary | ICD-10-CM | POA: Diagnosis not present

## 2013-06-12 DIAGNOSIS — M6281 Muscle weakness (generalized): Secondary | ICD-10-CM | POA: Diagnosis not present

## 2013-06-12 DIAGNOSIS — E119 Type 2 diabetes mellitus without complications: Secondary | ICD-10-CM | POA: Diagnosis not present

## 2013-06-12 DIAGNOSIS — R262 Difficulty in walking, not elsewhere classified: Secondary | ICD-10-CM | POA: Diagnosis not present

## 2013-06-12 DIAGNOSIS — IMO0001 Reserved for inherently not codable concepts without codable children: Secondary | ICD-10-CM | POA: Diagnosis not present

## 2013-06-12 DIAGNOSIS — J441 Chronic obstructive pulmonary disease with (acute) exacerbation: Secondary | ICD-10-CM | POA: Diagnosis not present

## 2013-06-12 DIAGNOSIS — I1310 Hypertensive heart and chronic kidney disease without heart failure, with stage 1 through stage 4 chronic kidney disease, or unspecified chronic kidney disease: Secondary | ICD-10-CM | POA: Diagnosis not present

## 2013-06-14 DIAGNOSIS — I1310 Hypertensive heart and chronic kidney disease without heart failure, with stage 1 through stage 4 chronic kidney disease, or unspecified chronic kidney disease: Secondary | ICD-10-CM | POA: Diagnosis not present

## 2013-06-14 DIAGNOSIS — J441 Chronic obstructive pulmonary disease with (acute) exacerbation: Secondary | ICD-10-CM | POA: Diagnosis not present

## 2013-06-14 DIAGNOSIS — R262 Difficulty in walking, not elsewhere classified: Secondary | ICD-10-CM | POA: Diagnosis not present

## 2013-06-14 DIAGNOSIS — M6281 Muscle weakness (generalized): Secondary | ICD-10-CM | POA: Diagnosis not present

## 2013-06-14 DIAGNOSIS — E119 Type 2 diabetes mellitus without complications: Secondary | ICD-10-CM | POA: Diagnosis not present

## 2013-06-14 DIAGNOSIS — IMO0001 Reserved for inherently not codable concepts without codable children: Secondary | ICD-10-CM | POA: Diagnosis not present

## 2013-06-18 DIAGNOSIS — Z9981 Dependence on supplemental oxygen: Secondary | ICD-10-CM | POA: Diagnosis not present

## 2013-06-18 DIAGNOSIS — I1310 Hypertensive heart and chronic kidney disease without heart failure, with stage 1 through stage 4 chronic kidney disease, or unspecified chronic kidney disease: Secondary | ICD-10-CM | POA: Diagnosis not present

## 2013-06-18 DIAGNOSIS — Z8673 Personal history of transient ischemic attack (TIA), and cerebral infarction without residual deficits: Secondary | ICD-10-CM | POA: Diagnosis not present

## 2013-06-18 DIAGNOSIS — R262 Difficulty in walking, not elsewhere classified: Secondary | ICD-10-CM | POA: Diagnosis not present

## 2013-06-18 DIAGNOSIS — E119 Type 2 diabetes mellitus without complications: Secondary | ICD-10-CM | POA: Diagnosis not present

## 2013-06-18 DIAGNOSIS — J441 Chronic obstructive pulmonary disease with (acute) exacerbation: Secondary | ICD-10-CM | POA: Diagnosis not present

## 2013-06-18 DIAGNOSIS — I509 Heart failure, unspecified: Secondary | ICD-10-CM | POA: Diagnosis not present

## 2013-06-18 DIAGNOSIS — M6281 Muscle weakness (generalized): Secondary | ICD-10-CM | POA: Diagnosis not present

## 2013-06-18 DIAGNOSIS — IMO0001 Reserved for inherently not codable concepts without codable children: Secondary | ICD-10-CM | POA: Diagnosis not present

## 2013-06-18 DIAGNOSIS — F0390 Unspecified dementia without behavioral disturbance: Secondary | ICD-10-CM | POA: Diagnosis not present

## 2013-06-18 DIAGNOSIS — J9600 Acute respiratory failure, unspecified whether with hypoxia or hypercapnia: Secondary | ICD-10-CM | POA: Diagnosis not present

## 2013-06-18 DIAGNOSIS — I5031 Acute diastolic (congestive) heart failure: Secondary | ICD-10-CM | POA: Diagnosis not present

## 2013-06-18 DIAGNOSIS — N183 Chronic kidney disease, stage 3 unspecified: Secondary | ICD-10-CM | POA: Diagnosis not present

## 2013-06-19 DIAGNOSIS — R799 Abnormal finding of blood chemistry, unspecified: Secondary | ICD-10-CM | POA: Diagnosis not present

## 2013-06-19 DIAGNOSIS — IMO0001 Reserved for inherently not codable concepts without codable children: Secondary | ICD-10-CM | POA: Diagnosis not present

## 2013-06-19 DIAGNOSIS — E7800 Pure hypercholesterolemia, unspecified: Secondary | ICD-10-CM | POA: Diagnosis not present

## 2013-06-19 DIAGNOSIS — J441 Chronic obstructive pulmonary disease with (acute) exacerbation: Secondary | ICD-10-CM | POA: Diagnosis not present

## 2013-06-19 DIAGNOSIS — I509 Heart failure, unspecified: Secondary | ICD-10-CM | POA: Diagnosis not present

## 2013-06-19 DIAGNOSIS — Z0000 Encounter for general adult medical examination without abnormal findings: Secondary | ICD-10-CM | POA: Diagnosis not present

## 2013-06-19 DIAGNOSIS — I1310 Hypertensive heart and chronic kidney disease without heart failure, with stage 1 through stage 4 chronic kidney disease, or unspecified chronic kidney disease: Secondary | ICD-10-CM | POA: Diagnosis not present

## 2013-06-19 DIAGNOSIS — J449 Chronic obstructive pulmonary disease, unspecified: Secondary | ICD-10-CM | POA: Diagnosis not present

## 2013-06-19 DIAGNOSIS — I10 Essential (primary) hypertension: Secondary | ICD-10-CM | POA: Diagnosis not present

## 2013-06-19 DIAGNOSIS — E119 Type 2 diabetes mellitus without complications: Secondary | ICD-10-CM | POA: Diagnosis not present

## 2013-06-19 DIAGNOSIS — R7309 Other abnormal glucose: Secondary | ICD-10-CM | POA: Diagnosis not present

## 2013-06-19 DIAGNOSIS — M6281 Muscle weakness (generalized): Secondary | ICD-10-CM | POA: Diagnosis not present

## 2013-06-19 DIAGNOSIS — R262 Difficulty in walking, not elsewhere classified: Secondary | ICD-10-CM | POA: Diagnosis not present

## 2013-06-23 DIAGNOSIS — R262 Difficulty in walking, not elsewhere classified: Secondary | ICD-10-CM | POA: Diagnosis not present

## 2013-06-23 DIAGNOSIS — M6281 Muscle weakness (generalized): Secondary | ICD-10-CM | POA: Diagnosis not present

## 2013-06-23 DIAGNOSIS — E119 Type 2 diabetes mellitus without complications: Secondary | ICD-10-CM | POA: Diagnosis not present

## 2013-06-23 DIAGNOSIS — J441 Chronic obstructive pulmonary disease with (acute) exacerbation: Secondary | ICD-10-CM | POA: Diagnosis not present

## 2013-06-23 DIAGNOSIS — I1310 Hypertensive heart and chronic kidney disease without heart failure, with stage 1 through stage 4 chronic kidney disease, or unspecified chronic kidney disease: Secondary | ICD-10-CM | POA: Diagnosis not present

## 2013-06-23 DIAGNOSIS — IMO0001 Reserved for inherently not codable concepts without codable children: Secondary | ICD-10-CM | POA: Diagnosis not present

## 2013-06-27 DIAGNOSIS — M6281 Muscle weakness (generalized): Secondary | ICD-10-CM | POA: Diagnosis not present

## 2013-06-27 DIAGNOSIS — I1310 Hypertensive heart and chronic kidney disease without heart failure, with stage 1 through stage 4 chronic kidney disease, or unspecified chronic kidney disease: Secondary | ICD-10-CM | POA: Diagnosis not present

## 2013-06-27 DIAGNOSIS — R262 Difficulty in walking, not elsewhere classified: Secondary | ICD-10-CM | POA: Diagnosis not present

## 2013-06-27 DIAGNOSIS — J441 Chronic obstructive pulmonary disease with (acute) exacerbation: Secondary | ICD-10-CM | POA: Diagnosis not present

## 2013-06-27 DIAGNOSIS — I509 Heart failure, unspecified: Secondary | ICD-10-CM | POA: Diagnosis not present

## 2013-06-27 DIAGNOSIS — E119 Type 2 diabetes mellitus without complications: Secondary | ICD-10-CM | POA: Diagnosis not present

## 2013-06-27 DIAGNOSIS — IMO0001 Reserved for inherently not codable concepts without codable children: Secondary | ICD-10-CM | POA: Diagnosis not present

## 2013-06-29 DIAGNOSIS — E1159 Type 2 diabetes mellitus with other circulatory complications: Secondary | ICD-10-CM | POA: Diagnosis not present

## 2013-06-29 DIAGNOSIS — M79609 Pain in unspecified limb: Secondary | ICD-10-CM | POA: Diagnosis not present

## 2013-06-29 DIAGNOSIS — I739 Peripheral vascular disease, unspecified: Secondary | ICD-10-CM | POA: Diagnosis not present

## 2013-06-29 DIAGNOSIS — L608 Other nail disorders: Secondary | ICD-10-CM | POA: Diagnosis not present

## 2013-06-30 DIAGNOSIS — J441 Chronic obstructive pulmonary disease with (acute) exacerbation: Secondary | ICD-10-CM | POA: Diagnosis not present

## 2013-06-30 DIAGNOSIS — M6281 Muscle weakness (generalized): Secondary | ICD-10-CM | POA: Diagnosis not present

## 2013-06-30 DIAGNOSIS — IMO0001 Reserved for inherently not codable concepts without codable children: Secondary | ICD-10-CM | POA: Diagnosis not present

## 2013-06-30 DIAGNOSIS — R262 Difficulty in walking, not elsewhere classified: Secondary | ICD-10-CM | POA: Diagnosis not present

## 2013-06-30 DIAGNOSIS — I1310 Hypertensive heart and chronic kidney disease without heart failure, with stage 1 through stage 4 chronic kidney disease, or unspecified chronic kidney disease: Secondary | ICD-10-CM | POA: Diagnosis not present

## 2013-06-30 DIAGNOSIS — E119 Type 2 diabetes mellitus without complications: Secondary | ICD-10-CM | POA: Diagnosis not present

## 2013-07-03 DIAGNOSIS — J441 Chronic obstructive pulmonary disease with (acute) exacerbation: Secondary | ICD-10-CM | POA: Diagnosis not present

## 2013-07-03 DIAGNOSIS — E119 Type 2 diabetes mellitus without complications: Secondary | ICD-10-CM | POA: Diagnosis not present

## 2013-07-03 DIAGNOSIS — M6281 Muscle weakness (generalized): Secondary | ICD-10-CM | POA: Diagnosis not present

## 2013-07-03 DIAGNOSIS — I1310 Hypertensive heart and chronic kidney disease without heart failure, with stage 1 through stage 4 chronic kidney disease, or unspecified chronic kidney disease: Secondary | ICD-10-CM | POA: Diagnosis not present

## 2013-07-03 DIAGNOSIS — IMO0001 Reserved for inherently not codable concepts without codable children: Secondary | ICD-10-CM | POA: Diagnosis not present

## 2013-07-03 DIAGNOSIS — R262 Difficulty in walking, not elsewhere classified: Secondary | ICD-10-CM | POA: Diagnosis not present

## 2013-07-06 DIAGNOSIS — R262 Difficulty in walking, not elsewhere classified: Secondary | ICD-10-CM | POA: Diagnosis not present

## 2013-07-06 DIAGNOSIS — E119 Type 2 diabetes mellitus without complications: Secondary | ICD-10-CM | POA: Diagnosis not present

## 2013-07-06 DIAGNOSIS — I1310 Hypertensive heart and chronic kidney disease without heart failure, with stage 1 through stage 4 chronic kidney disease, or unspecified chronic kidney disease: Secondary | ICD-10-CM | POA: Diagnosis not present

## 2013-07-06 DIAGNOSIS — M6281 Muscle weakness (generalized): Secondary | ICD-10-CM | POA: Diagnosis not present

## 2013-07-06 DIAGNOSIS — J441 Chronic obstructive pulmonary disease with (acute) exacerbation: Secondary | ICD-10-CM | POA: Diagnosis not present

## 2013-07-06 DIAGNOSIS — IMO0001 Reserved for inherently not codable concepts without codable children: Secondary | ICD-10-CM | POA: Diagnosis not present

## 2013-07-10 DIAGNOSIS — R262 Difficulty in walking, not elsewhere classified: Secondary | ICD-10-CM | POA: Diagnosis not present

## 2013-07-10 DIAGNOSIS — I1310 Hypertensive heart and chronic kidney disease without heart failure, with stage 1 through stage 4 chronic kidney disease, or unspecified chronic kidney disease: Secondary | ICD-10-CM | POA: Diagnosis not present

## 2013-07-10 DIAGNOSIS — IMO0001 Reserved for inherently not codable concepts without codable children: Secondary | ICD-10-CM | POA: Diagnosis not present

## 2013-07-10 DIAGNOSIS — E119 Type 2 diabetes mellitus without complications: Secondary | ICD-10-CM | POA: Diagnosis not present

## 2013-07-10 DIAGNOSIS — J441 Chronic obstructive pulmonary disease with (acute) exacerbation: Secondary | ICD-10-CM | POA: Diagnosis not present

## 2013-07-10 DIAGNOSIS — M6281 Muscle weakness (generalized): Secondary | ICD-10-CM | POA: Diagnosis not present

## 2013-07-12 DIAGNOSIS — IMO0001 Reserved for inherently not codable concepts without codable children: Secondary | ICD-10-CM | POA: Diagnosis not present

## 2013-07-12 DIAGNOSIS — I1310 Hypertensive heart and chronic kidney disease without heart failure, with stage 1 through stage 4 chronic kidney disease, or unspecified chronic kidney disease: Secondary | ICD-10-CM | POA: Diagnosis not present

## 2013-07-12 DIAGNOSIS — R262 Difficulty in walking, not elsewhere classified: Secondary | ICD-10-CM | POA: Diagnosis not present

## 2013-07-12 DIAGNOSIS — M6281 Muscle weakness (generalized): Secondary | ICD-10-CM | POA: Diagnosis not present

## 2013-07-12 DIAGNOSIS — E119 Type 2 diabetes mellitus without complications: Secondary | ICD-10-CM | POA: Diagnosis not present

## 2013-07-12 DIAGNOSIS — J441 Chronic obstructive pulmonary disease with (acute) exacerbation: Secondary | ICD-10-CM | POA: Diagnosis not present

## 2013-08-01 DIAGNOSIS — I509 Heart failure, unspecified: Secondary | ICD-10-CM | POA: Diagnosis not present

## 2013-08-01 DIAGNOSIS — E119 Type 2 diabetes mellitus without complications: Secondary | ICD-10-CM | POA: Diagnosis not present

## 2013-08-01 DIAGNOSIS — I6789 Other cerebrovascular disease: Secondary | ICD-10-CM | POA: Diagnosis not present

## 2013-08-01 DIAGNOSIS — I10 Essential (primary) hypertension: Secondary | ICD-10-CM | POA: Diagnosis not present

## 2013-08-03 ENCOUNTER — Other Ambulatory Visit (HOSPITAL_COMMUNITY): Payer: Self-pay | Admitting: Internal Medicine

## 2013-08-03 DIAGNOSIS — Z1231 Encounter for screening mammogram for malignant neoplasm of breast: Secondary | ICD-10-CM

## 2013-08-11 ENCOUNTER — Ambulatory Visit (HOSPITAL_COMMUNITY)
Admission: RE | Admit: 2013-08-11 | Discharge: 2013-08-11 | Disposition: A | Discharge status: Home / Self Care | Payer: Medicare Other | Source: Ambulatory Visit | Attending: Internal Medicine | Admitting: Internal Medicine

## 2013-08-11 DIAGNOSIS — Z1231 Encounter for screening mammogram for malignant neoplasm of breast: Secondary | ICD-10-CM | POA: Diagnosis not present

## 2013-09-01 DIAGNOSIS — J449 Chronic obstructive pulmonary disease, unspecified: Secondary | ICD-10-CM | POA: Diagnosis not present

## 2013-09-19 DIAGNOSIS — E1149 Type 2 diabetes mellitus with other diabetic neurological complication: Secondary | ICD-10-CM | POA: Diagnosis not present

## 2013-10-31 DIAGNOSIS — E119 Type 2 diabetes mellitus without complications: Secondary | ICD-10-CM | POA: Diagnosis not present

## 2013-10-31 DIAGNOSIS — I10 Essential (primary) hypertension: Secondary | ICD-10-CM | POA: Diagnosis not present

## 2013-10-31 DIAGNOSIS — J449 Chronic obstructive pulmonary disease, unspecified: Secondary | ICD-10-CM | POA: Diagnosis not present

## 2013-10-31 DIAGNOSIS — I509 Heart failure, unspecified: Secondary | ICD-10-CM | POA: Diagnosis not present

## 2013-11-30 DIAGNOSIS — N61 Mastitis without abscess: Secondary | ICD-10-CM | POA: Diagnosis not present

## 2013-11-30 DIAGNOSIS — J449 Chronic obstructive pulmonary disease, unspecified: Secondary | ICD-10-CM | POA: Diagnosis not present

## 2013-11-30 DIAGNOSIS — E119 Type 2 diabetes mellitus without complications: Secondary | ICD-10-CM | POA: Diagnosis not present

## 2013-11-30 DIAGNOSIS — I509 Heart failure, unspecified: Secondary | ICD-10-CM | POA: Diagnosis not present

## 2013-12-19 DIAGNOSIS — E119 Type 2 diabetes mellitus without complications: Secondary | ICD-10-CM | POA: Diagnosis not present

## 2014-01-22 DIAGNOSIS — I509 Heart failure, unspecified: Secondary | ICD-10-CM | POA: Diagnosis not present

## 2014-01-22 DIAGNOSIS — I10 Essential (primary) hypertension: Secondary | ICD-10-CM | POA: Diagnosis not present

## 2014-01-22 DIAGNOSIS — I6350 Cerebral infarction due to unspecified occlusion or stenosis of unspecified cerebral artery: Secondary | ICD-10-CM | POA: Diagnosis not present

## 2014-01-23 DIAGNOSIS — I739 Peripheral vascular disease, unspecified: Secondary | ICD-10-CM | POA: Diagnosis not present

## 2014-01-23 DIAGNOSIS — L608 Other nail disorders: Secondary | ICD-10-CM | POA: Diagnosis not present

## 2014-03-26 DIAGNOSIS — I509 Heart failure, unspecified: Secondary | ICD-10-CM | POA: Diagnosis not present

## 2014-04-02 DIAGNOSIS — I509 Heart failure, unspecified: Secondary | ICD-10-CM | POA: Diagnosis not present

## 2014-04-02 DIAGNOSIS — I10 Essential (primary) hypertension: Secondary | ICD-10-CM | POA: Diagnosis not present

## 2014-04-02 DIAGNOSIS — I6781 Acute cerebrovascular insufficiency: Secondary | ICD-10-CM | POA: Diagnosis not present

## 2014-04-02 DIAGNOSIS — J449 Chronic obstructive pulmonary disease, unspecified: Secondary | ICD-10-CM | POA: Diagnosis not present

## 2014-04-12 DIAGNOSIS — E1151 Type 2 diabetes mellitus with diabetic peripheral angiopathy without gangrene: Secondary | ICD-10-CM | POA: Diagnosis not present

## 2014-04-12 DIAGNOSIS — I739 Peripheral vascular disease, unspecified: Secondary | ICD-10-CM | POA: Diagnosis not present

## 2014-04-12 DIAGNOSIS — L603 Nail dystrophy: Secondary | ICD-10-CM | POA: Diagnosis not present

## 2014-04-18 DIAGNOSIS — J449 Chronic obstructive pulmonary disease, unspecified: Secondary | ICD-10-CM | POA: Diagnosis not present

## 2014-04-18 DIAGNOSIS — I6781 Acute cerebrovascular insufficiency: Secondary | ICD-10-CM | POA: Diagnosis not present

## 2014-04-18 DIAGNOSIS — I509 Heart failure, unspecified: Secondary | ICD-10-CM | POA: Diagnosis not present

## 2014-04-18 DIAGNOSIS — I10 Essential (primary) hypertension: Secondary | ICD-10-CM | POA: Diagnosis not present

## 2014-04-25 DIAGNOSIS — I6781 Acute cerebrovascular insufficiency: Secondary | ICD-10-CM | POA: Diagnosis not present

## 2014-04-25 DIAGNOSIS — J449 Chronic obstructive pulmonary disease, unspecified: Secondary | ICD-10-CM | POA: Diagnosis not present

## 2014-04-25 DIAGNOSIS — I509 Heart failure, unspecified: Secondary | ICD-10-CM | POA: Diagnosis not present

## 2014-04-25 DIAGNOSIS — I10 Essential (primary) hypertension: Secondary | ICD-10-CM | POA: Diagnosis not present

## 2014-06-06 DIAGNOSIS — Z0000 Encounter for general adult medical examination without abnormal findings: Secondary | ICD-10-CM | POA: Diagnosis not present

## 2014-06-06 DIAGNOSIS — I509 Heart failure, unspecified: Secondary | ICD-10-CM | POA: Diagnosis not present

## 2014-06-06 DIAGNOSIS — E119 Type 2 diabetes mellitus without complications: Secondary | ICD-10-CM | POA: Diagnosis not present

## 2014-06-06 DIAGNOSIS — I10 Essential (primary) hypertension: Secondary | ICD-10-CM | POA: Diagnosis not present

## 2014-06-06 DIAGNOSIS — E039 Hypothyroidism, unspecified: Secondary | ICD-10-CM | POA: Diagnosis not present

## 2014-06-19 DIAGNOSIS — Z0000 Encounter for general adult medical examination without abnormal findings: Secondary | ICD-10-CM | POA: Diagnosis not present

## 2014-06-19 DIAGNOSIS — I10 Essential (primary) hypertension: Secondary | ICD-10-CM | POA: Diagnosis not present

## 2014-06-19 DIAGNOSIS — E119 Type 2 diabetes mellitus without complications: Secondary | ICD-10-CM | POA: Diagnosis not present

## 2014-06-19 DIAGNOSIS — I5032 Chronic diastolic (congestive) heart failure: Secondary | ICD-10-CM | POA: Diagnosis not present

## 2014-07-02 DIAGNOSIS — I739 Peripheral vascular disease, unspecified: Secondary | ICD-10-CM | POA: Diagnosis not present

## 2014-07-02 DIAGNOSIS — L603 Nail dystrophy: Secondary | ICD-10-CM | POA: Diagnosis not present

## 2014-07-02 DIAGNOSIS — L84 Corns and callosities: Secondary | ICD-10-CM | POA: Diagnosis not present

## 2014-07-02 DIAGNOSIS — E1151 Type 2 diabetes mellitus with diabetic peripheral angiopathy without gangrene: Secondary | ICD-10-CM | POA: Diagnosis not present

## 2014-07-27 DIAGNOSIS — E119 Type 2 diabetes mellitus without complications: Secondary | ICD-10-CM | POA: Diagnosis not present

## 2014-07-27 DIAGNOSIS — J449 Chronic obstructive pulmonary disease, unspecified: Secondary | ICD-10-CM | POA: Diagnosis not present

## 2014-08-21 DIAGNOSIS — J449 Chronic obstructive pulmonary disease, unspecified: Secondary | ICD-10-CM | POA: Diagnosis not present

## 2014-08-21 DIAGNOSIS — E119 Type 2 diabetes mellitus without complications: Secondary | ICD-10-CM | POA: Diagnosis not present

## 2014-09-10 DIAGNOSIS — I739 Peripheral vascular disease, unspecified: Secondary | ICD-10-CM | POA: Diagnosis not present

## 2014-09-10 DIAGNOSIS — L603 Nail dystrophy: Secondary | ICD-10-CM | POA: Diagnosis not present

## 2014-09-10 DIAGNOSIS — E1151 Type 2 diabetes mellitus with diabetic peripheral angiopathy without gangrene: Secondary | ICD-10-CM | POA: Diagnosis not present

## 2014-09-21 DIAGNOSIS — J449 Chronic obstructive pulmonary disease, unspecified: Secondary | ICD-10-CM | POA: Diagnosis not present

## 2014-09-21 DIAGNOSIS — E119 Type 2 diabetes mellitus without complications: Secondary | ICD-10-CM | POA: Diagnosis not present

## 2014-09-24 ENCOUNTER — Other Ambulatory Visit (HOSPITAL_COMMUNITY): Payer: Self-pay | Admitting: Internal Medicine

## 2014-09-24 DIAGNOSIS — Z1231 Encounter for screening mammogram for malignant neoplasm of breast: Secondary | ICD-10-CM

## 2014-10-17 ENCOUNTER — Ambulatory Visit (HOSPITAL_COMMUNITY)
Admission: RE | Admit: 2014-10-17 | Discharge: 2014-10-17 | Disposition: A | Discharge status: Home / Self Care | Payer: Medicare Other | Source: Ambulatory Visit | Attending: Internal Medicine | Admitting: Internal Medicine

## 2014-10-17 DIAGNOSIS — Z1231 Encounter for screening mammogram for malignant neoplasm of breast: Secondary | ICD-10-CM | POA: Diagnosis not present

## 2014-10-17 DIAGNOSIS — E119 Type 2 diabetes mellitus without complications: Secondary | ICD-10-CM | POA: Diagnosis not present

## 2014-10-17 DIAGNOSIS — J449 Chronic obstructive pulmonary disease, unspecified: Secondary | ICD-10-CM | POA: Diagnosis not present

## 2014-10-21 DIAGNOSIS — J449 Chronic obstructive pulmonary disease, unspecified: Secondary | ICD-10-CM | POA: Diagnosis not present

## 2014-10-21 DIAGNOSIS — E119 Type 2 diabetes mellitus without complications: Secondary | ICD-10-CM | POA: Diagnosis not present

## 2014-11-19 DIAGNOSIS — I739 Peripheral vascular disease, unspecified: Secondary | ICD-10-CM | POA: Diagnosis not present

## 2014-11-19 DIAGNOSIS — L603 Nail dystrophy: Secondary | ICD-10-CM | POA: Diagnosis not present

## 2014-11-19 DIAGNOSIS — E1151 Type 2 diabetes mellitus with diabetic peripheral angiopathy without gangrene: Secondary | ICD-10-CM | POA: Diagnosis not present

## 2014-11-21 DIAGNOSIS — E119 Type 2 diabetes mellitus without complications: Secondary | ICD-10-CM | POA: Diagnosis not present

## 2014-11-21 DIAGNOSIS — J449 Chronic obstructive pulmonary disease, unspecified: Secondary | ICD-10-CM | POA: Diagnosis not present

## 2014-12-05 DIAGNOSIS — J449 Chronic obstructive pulmonary disease, unspecified: Secondary | ICD-10-CM | POA: Diagnosis not present

## 2014-12-05 DIAGNOSIS — F0390 Unspecified dementia without behavioral disturbance: Secondary | ICD-10-CM | POA: Diagnosis not present

## 2014-12-05 DIAGNOSIS — I5032 Chronic diastolic (congestive) heart failure: Secondary | ICD-10-CM | POA: Diagnosis not present

## 2014-12-05 DIAGNOSIS — I10 Essential (primary) hypertension: Secondary | ICD-10-CM | POA: Diagnosis not present

## 2014-12-13 DIAGNOSIS — I10 Essential (primary) hypertension: Secondary | ICD-10-CM | POA: Diagnosis not present

## 2015-01-05 DIAGNOSIS — J449 Chronic obstructive pulmonary disease, unspecified: Secondary | ICD-10-CM | POA: Diagnosis not present

## 2015-01-05 DIAGNOSIS — E119 Type 2 diabetes mellitus without complications: Secondary | ICD-10-CM | POA: Diagnosis not present

## 2015-02-05 DIAGNOSIS — E119 Type 2 diabetes mellitus without complications: Secondary | ICD-10-CM | POA: Diagnosis not present

## 2015-02-05 DIAGNOSIS — J449 Chronic obstructive pulmonary disease, unspecified: Secondary | ICD-10-CM | POA: Diagnosis not present

## 2015-02-11 DIAGNOSIS — E1151 Type 2 diabetes mellitus with diabetic peripheral angiopathy without gangrene: Secondary | ICD-10-CM | POA: Diagnosis not present

## 2015-02-11 DIAGNOSIS — L603 Nail dystrophy: Secondary | ICD-10-CM | POA: Diagnosis not present

## 2015-02-11 DIAGNOSIS — I739 Peripheral vascular disease, unspecified: Secondary | ICD-10-CM | POA: Diagnosis not present

## 2015-02-14 DIAGNOSIS — I509 Heart failure, unspecified: Secondary | ICD-10-CM | POA: Diagnosis not present

## 2015-03-07 DIAGNOSIS — E119 Type 2 diabetes mellitus without complications: Secondary | ICD-10-CM | POA: Diagnosis not present

## 2015-03-07 DIAGNOSIS — J449 Chronic obstructive pulmonary disease, unspecified: Secondary | ICD-10-CM | POA: Diagnosis not present

## 2015-03-19 DIAGNOSIS — Z961 Presence of intraocular lens: Secondary | ICD-10-CM | POA: Diagnosis not present

## 2015-03-19 DIAGNOSIS — E119 Type 2 diabetes mellitus without complications: Secondary | ICD-10-CM | POA: Diagnosis not present

## 2015-04-07 DIAGNOSIS — E119 Type 2 diabetes mellitus without complications: Secondary | ICD-10-CM | POA: Diagnosis not present

## 2015-04-07 DIAGNOSIS — J449 Chronic obstructive pulmonary disease, unspecified: Secondary | ICD-10-CM | POA: Diagnosis not present

## 2015-04-17 DIAGNOSIS — J449 Chronic obstructive pulmonary disease, unspecified: Secondary | ICD-10-CM | POA: Diagnosis not present

## 2015-05-07 DIAGNOSIS — E119 Type 2 diabetes mellitus without complications: Secondary | ICD-10-CM | POA: Diagnosis not present

## 2015-05-07 DIAGNOSIS — J449 Chronic obstructive pulmonary disease, unspecified: Secondary | ICD-10-CM | POA: Diagnosis not present

## 2015-05-07 DIAGNOSIS — E1142 Type 2 diabetes mellitus with diabetic polyneuropathy: Secondary | ICD-10-CM | POA: Diagnosis not present

## 2015-05-14 IMAGING — CR DG CHEST 1V PORT
1 series · 1 of 1 positions shown · non-contrast
Comparison: 09/19/2010 and earlier.

CLINICAL DATA: 83-year-old female shortness of Breath.

EXAM:
PORTABLE CHEST - 1 VIEW

[portable]
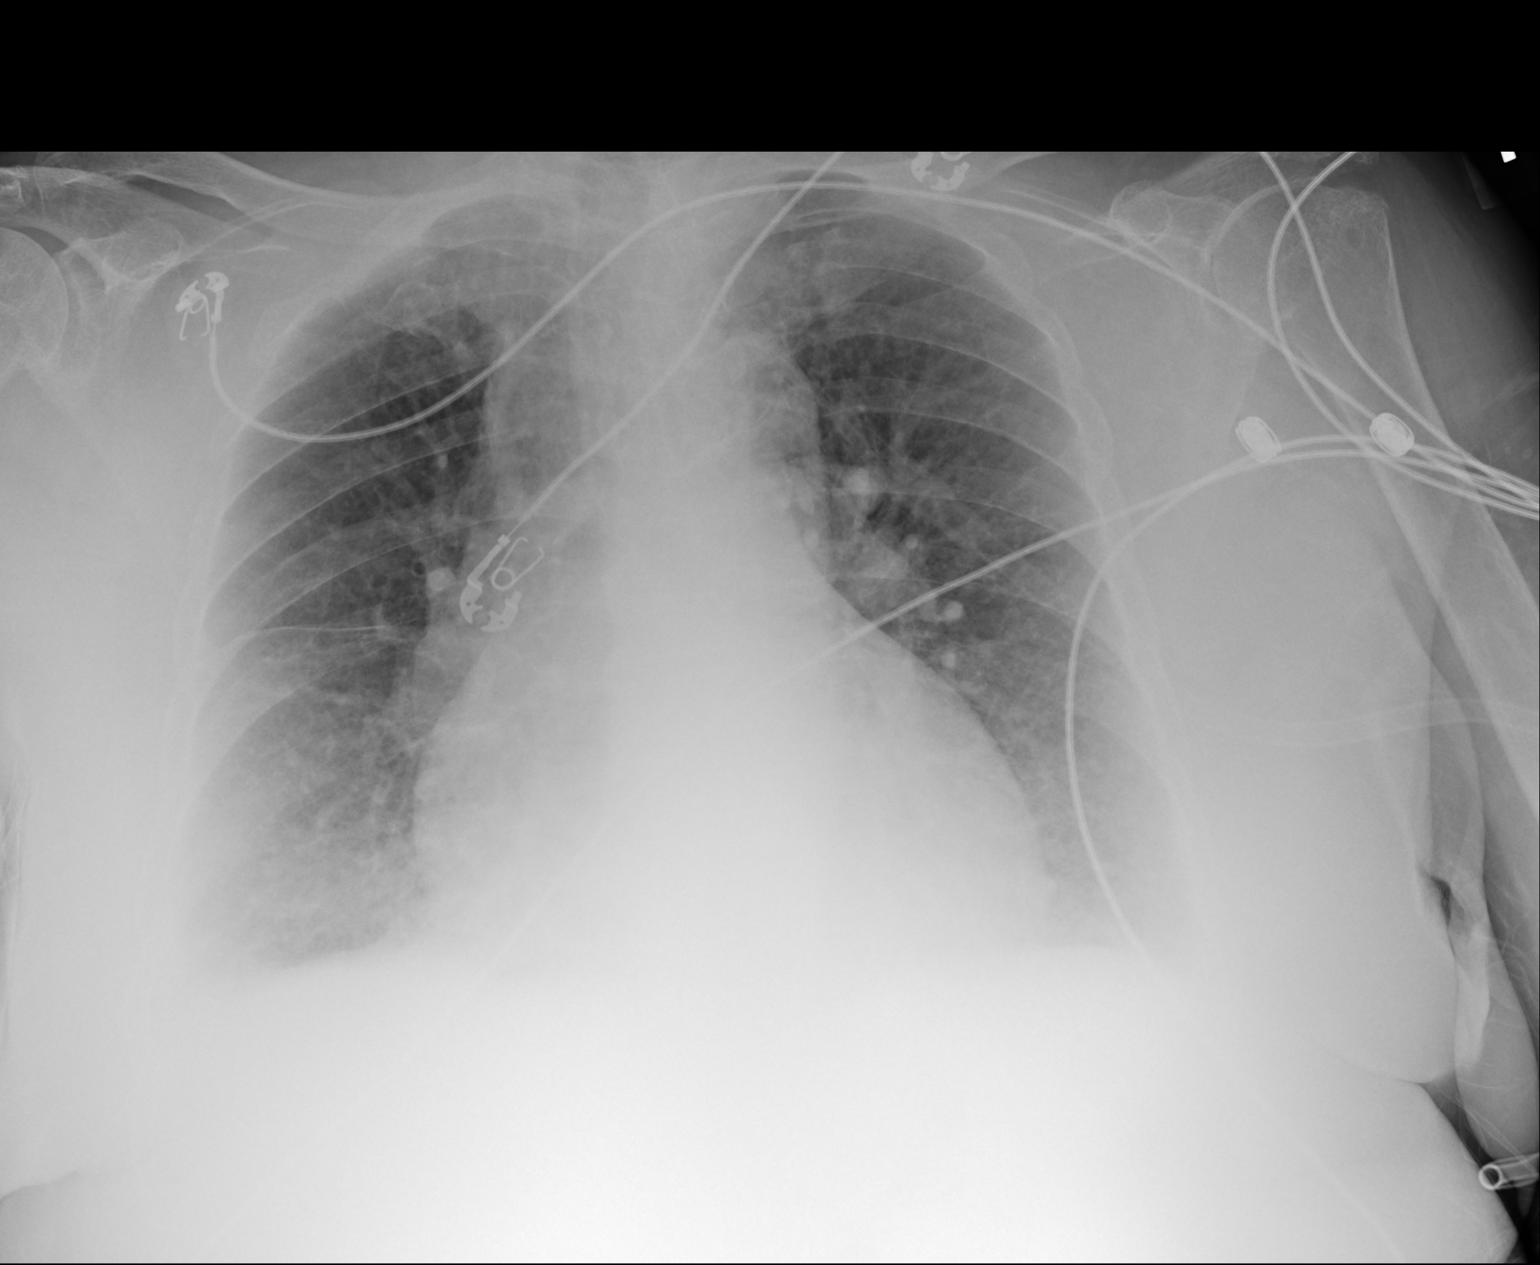

[1 of 1 positions shown; findings below may reference images not displayed]

FINDINGS: Portable upright AP view at 3000 hrs. Lower lung volumes. Increased
interstitial opacity most confluent at the lung bases. Trace fluid
at the right minor fissure. Evidence of small pleural effusions. No
pneumothorax or consolidation. Stable cardiac size and mediastinal
contours.
IMPRESSION: Lower lung volumes with small pleural effusions and increased
interstitial opacity favored to represent acute pulmonary edema.
Vital/atypical respiratory infection is felt less likely.

## 2015-05-19 IMAGING — CR DG CHEST 1V
1 series · 1 of 1 positions shown · non-contrast
Comparison: 01/31/2013

CLINICAL DATA: Lethargic.  Weakness.  Question CHF.

CHEST - 1 VIEW

[ap]
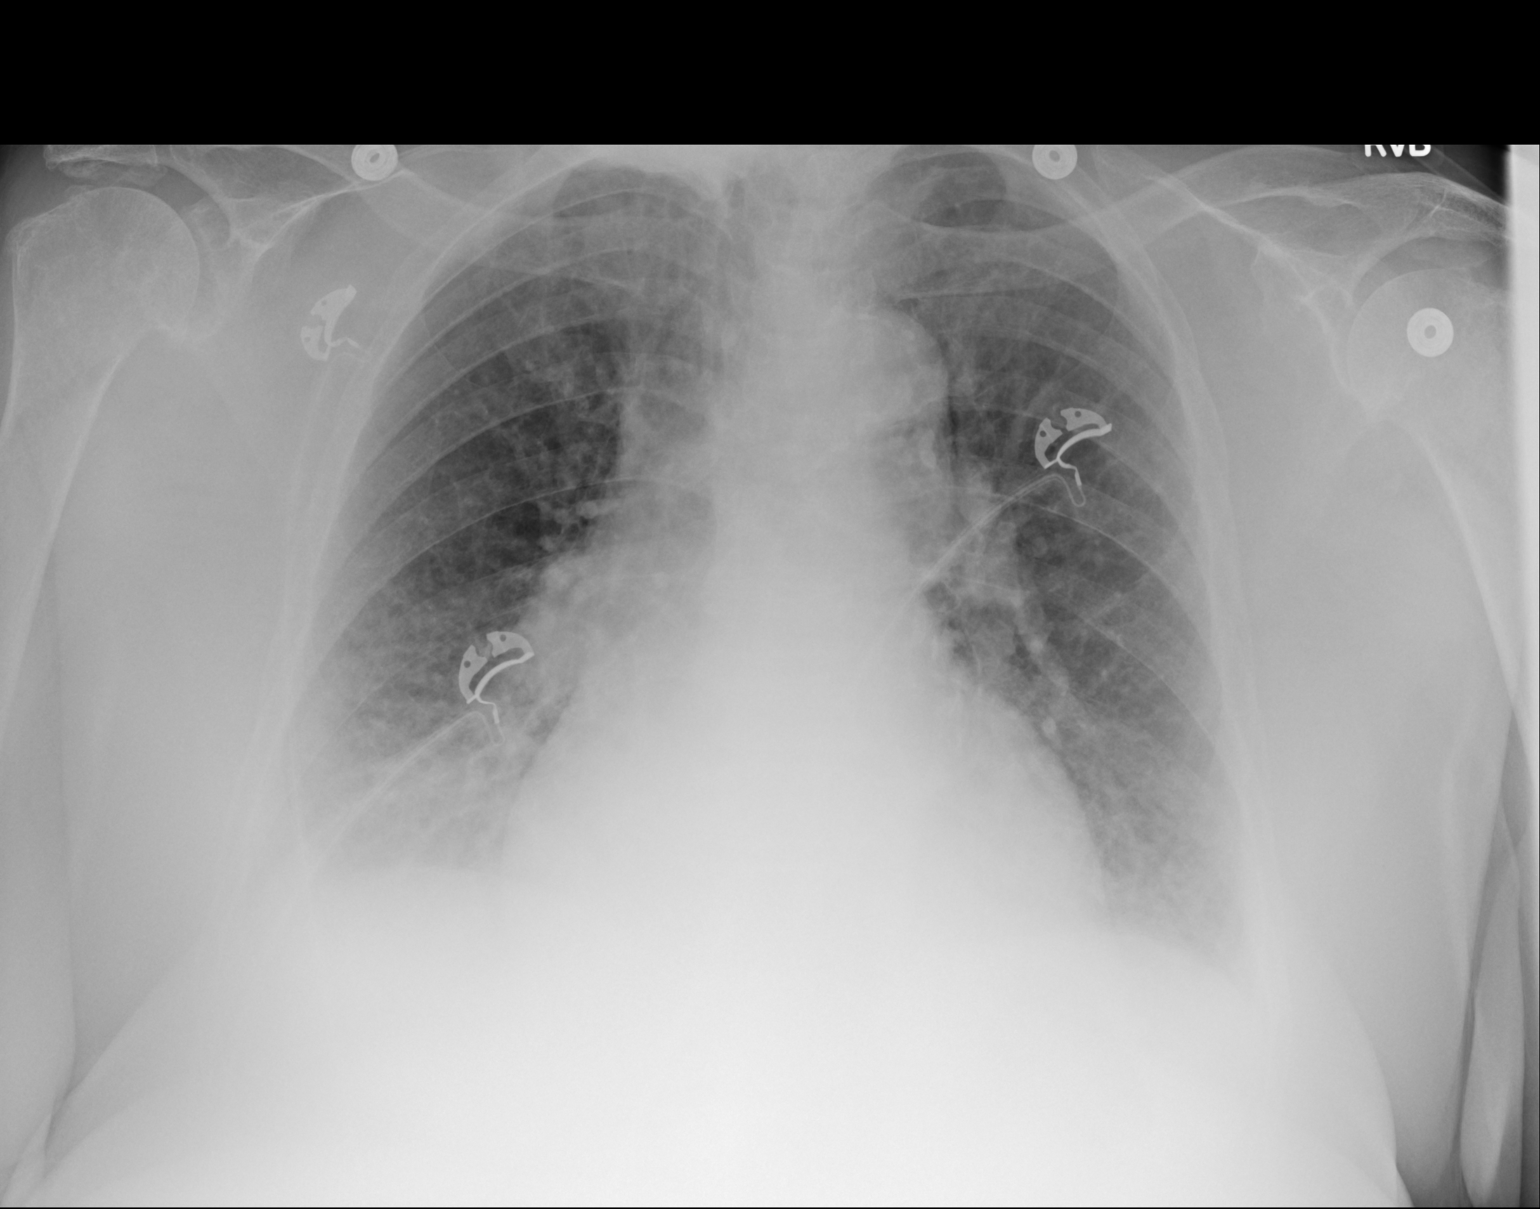

[1 of 1 positions shown; findings below may reference images not displayed]

FINDINGS: Midline trachea.  Cardiomegaly accentuated by AP portable
technique.  Suspect a small right pleural effusion. No
pneumothorax.  Mild pulmonary venous congestion, slightly improved.
Accentuated by mildly low lung volumes.  Similar left and worsened
right base aeration.
IMPRESSION: Slightly improved interstitial edema/venous congestion.

Suspect a small right pleural effusion with developing adjacent
airspace disease, likely atelectasis.

## 2015-06-05 DIAGNOSIS — J449 Chronic obstructive pulmonary disease, unspecified: Secondary | ICD-10-CM | POA: Diagnosis not present

## 2015-06-05 DIAGNOSIS — I5032 Chronic diastolic (congestive) heart failure: Secondary | ICD-10-CM | POA: Diagnosis not present

## 2015-06-05 DIAGNOSIS — R0902 Hypoxemia: Secondary | ICD-10-CM | POA: Diagnosis not present

## 2015-06-05 DIAGNOSIS — I10 Essential (primary) hypertension: Secondary | ICD-10-CM | POA: Diagnosis not present

## 2015-07-06 DIAGNOSIS — I10 Essential (primary) hypertension: Secondary | ICD-10-CM | POA: Diagnosis not present

## 2015-07-06 DIAGNOSIS — I5032 Chronic diastolic (congestive) heart failure: Secondary | ICD-10-CM | POA: Diagnosis not present

## 2015-07-16 DIAGNOSIS — I739 Peripheral vascular disease, unspecified: Secondary | ICD-10-CM | POA: Diagnosis not present

## 2015-07-16 DIAGNOSIS — L603 Nail dystrophy: Secondary | ICD-10-CM | POA: Diagnosis not present

## 2015-07-16 DIAGNOSIS — L84 Corns and callosities: Secondary | ICD-10-CM | POA: Diagnosis not present

## 2015-07-16 DIAGNOSIS — E1151 Type 2 diabetes mellitus with diabetic peripheral angiopathy without gangrene: Secondary | ICD-10-CM | POA: Diagnosis not present

## 2015-08-02 DIAGNOSIS — I10 Essential (primary) hypertension: Secondary | ICD-10-CM | POA: Diagnosis not present

## 2015-08-02 DIAGNOSIS — I5032 Chronic diastolic (congestive) heart failure: Secondary | ICD-10-CM | POA: Diagnosis not present

## 2015-08-21 DIAGNOSIS — I5032 Chronic diastolic (congestive) heart failure: Secondary | ICD-10-CM | POA: Diagnosis not present

## 2015-08-21 DIAGNOSIS — B999 Unspecified infectious disease: Secondary | ICD-10-CM | POA: Diagnosis not present

## 2015-08-21 DIAGNOSIS — J449 Chronic obstructive pulmonary disease, unspecified: Secondary | ICD-10-CM | POA: Diagnosis not present

## 2015-08-26 ENCOUNTER — Ambulatory Visit (HOSPITAL_COMMUNITY)
Admission: RE | Admit: 2015-08-26 | Discharge: 2015-08-26 | Disposition: A | Discharge status: Home / Self Care | Payer: Medicare Other | Source: Ambulatory Visit | Attending: Internal Medicine | Admitting: Internal Medicine

## 2015-08-26 DIAGNOSIS — R0602 Shortness of breath: Secondary | ICD-10-CM | POA: Diagnosis not present

## 2015-08-26 DIAGNOSIS — J449 Chronic obstructive pulmonary disease, unspecified: Secondary | ICD-10-CM

## 2015-09-21 DIAGNOSIS — J449 Chronic obstructive pulmonary disease, unspecified: Secondary | ICD-10-CM | POA: Diagnosis not present

## 2015-09-21 DIAGNOSIS — I10 Essential (primary) hypertension: Secondary | ICD-10-CM | POA: Diagnosis not present

## 2015-09-23 ENCOUNTER — Encounter: Payer: Self-pay | Admitting: Internal Medicine

## 2015-09-23 ENCOUNTER — Ambulatory Visit (INDEPENDENT_AMBULATORY_CARE_PROVIDER_SITE_OTHER): Payer: Medicare Other | Admitting: Internal Medicine

## 2015-09-23 VITALS — BP 126/60 | HR 91 | Ht 65.0 in | Wt 184.0 lb

## 2015-09-23 DIAGNOSIS — I5032 Chronic diastolic (congestive) heart failure: Secondary | ICD-10-CM | POA: Diagnosis not present

## 2015-09-23 NOTE — Progress Notes (Signed)
Cardiology Office Note   Date:  09/23/2015   ID:  Brittney HawsMary A Fluhr, DOB 11/10/1929, MRN [id number]  PCP:  Avon GullyFANTA,TESFAYE, MD  Cardiologist:   Dietrich PatesPaula Willadeen Colantuono, MD   Pt referred by Dr Felecia ShellingFanta for CHF   She has been seen by Drs Wyline MoodBranch and Shirlee LatchMcLean in 2014     History of Present Illness: Brittney Li is a 82 y.o. female with a history of SOB   She is followed by Dr Felecia ShellingFanta.  Echo done recently showed Mod LVH  LVEF 55 to 60^ with Gr II Diastolic dysfunction  RVEF was mildly reduced  Note pt had echo done in 2014 with similar results   The pt has hx of SOB  Says over the past year has gotten worse  Using home O2   Hx smoking  Stopped years ago  She denies CP  No syncope         Outpatient Prescriptions Prior to Visit  Medication Sig Dispense Refill  . albuterol (PROVENTIL HFA;VENTOLIN HFA) 108 (90 BASE) MCG/ACT inhaler Inhale 2 puffs into the lungs every 6 (six) hours as needed for wheezing.    Marland Kitchen. albuterol (PROVENTIL) (2.5 MG/3ML) 0.083% nebulizer solution Take 2.5 mg by nebulization every 6 (six) hours as needed for wheezing.    Marland Kitchen. amLODipine-benazepril (LOTREL) 10-20 MG per capsule Take 1 capsule by mouth daily.    Marland Kitchen. aspirin 81 MG tablet Take 81 mg by mouth daily.      Marland Kitchen. docusate sodium (COLACE) 100 MG capsule Take 200 mg by mouth at bedtime.    . donepezil (ARICEPT) 10 MG tablet Take 10 mg by mouth at bedtime.    . famotidine (PEPCID) 20 MG tablet Take 20 mg by mouth daily.    . mirtazapine (REMERON) 30 MG tablet Take 30 mg by mouth at bedtime.    . mometasone-formoterol (DULERA) 200-5 MCG/ACT AERO Inhale 2 puffs into the lungs 2 (two) times daily. 1 Inhaler 3  . Multiple Vitamin (MULTIVITAMIN WITH MINERALS) TABS tablet Take 1 tablet by mouth daily.    Marland Kitchen. oxybutynin (DITROPAN) 5 MG tablet Take 5 mg by mouth daily.    . potassium chloride (K-DUR) 10 MEQ tablet Take 2 tablets (20 mEq total) by mouth 2 (two) times daily. 60 tablet 3  . pravastatin (PRAVACHOL) 80 MG tablet Take 80 mg by mouth  daily.    Marland Kitchen. torsemide (DEMADEX) 20 MG tablet Take 1 tablet (20 mg total) by mouth 2 (two) times daily.    . traMADol (ULTRAM) 50 MG tablet Take 50 mg by mouth 3 (three) times daily as needed for pain (Takes one tablet every morning and evening.).     No facility-administered medications prior to visit.     Allergies:   Erythromycin and Penicillins   Past Medical History  Diagnosis Date  . Asthma   . Bronchitis   . CHF (congestive heart failure) (HCC)     a. ECHO (01/2013) EF 60-65%, grade II diastolic dysfx, LA mildly dilated  . COPD (chronic obstructive pulmonary disease) (HCC)   . Hypertension   . Diabetes mellitus without complication (HCC)   . Arthritis     Past Surgical History  Procedure Laterality Date  . Hands    . Abdominal hysterectomy  1975     Social History:  The patient  reports that she has quit smoking. She has never used smokeless tobacco. She reports that she does not drink alcohol or use illicit drugs.   Family History:  The patient's family history is not on file.    ROS:  Please see the history of present illness. All other systems are reviewed and  Negative to the above problem except as noted.    PHYSICAL EXAM: VS:  BP 126/60 mmHg  Pulse 91  Ht  (1.651 m)  Wt 184 lb (83.462 kg)  BMI 30.62 kg/m2  SpO2 95%  GEN: Well nourished, well developed, in no acute distressWearing oxygen HEENT: normal Neck: no JVD, carotid bruits, or masses Cardiac: RRR; no murmurs, rubs, or gallops,1+   edema  Respiratory:  clear to auscultation bilaterally, normal work of breathing GI: soft, nontender, nondistended, + BS  No hepatomegaly  MS: no deformity Moving all extremities   Skin: warm and dry, no rash Neuro:  Strength and sensation are intact Psych: euthymic mood, full affect   EKG:  EKG is ordered today.  SR 72 bpm  RBBB  LAFB  LVH with repol abnormalitiy     Lipid Panel    Component Value Date/Time   CHOL 122 09/07/2008 0352   TRIG 117 09/07/2008  0352   HDL 44 09/07/2008 0352   CHOLHDL 2.8 Ratio 09/07/2008 0352   VLDL 23 09/07/2008 0352   LDLCALC 55 09/07/2008 0352      Wt Readings from Last 3 Encounters:  09/23/15 184 lb (83.462 kg)  03/06/13 213 lb 10 oz (96.9 kg)  02/06/13 211 lb 6.7 oz (95.9 kg)      ASSESSMENT AND PLAN:  1  CHF  Pt with Gr II diastolic dysfunction  She also has mild RV dysfunction on echo   These findings are similar to 3 years ago     She is on O2 for most of day  Hx of COPD   On exam she has evid of some mild volume increase  Moving air OK    I would recomm getting labs today  (electrolytes, BNP) I would consider CT angio to eval pulmonary system (eval for Pulm HTN) WIll watif for labs  I do not think ths represents CAD   2  HTN  BP is OK  3  COPD  Continue O2    F/U will be based on test results       Signed, Dietrich Pates, MD  09/23/2015 2:00 PM    [facility] [address] [place], [place], [place]  [address] Phone: [phone]; Fax: [phone]

## 2015-09-23 NOTE — Patient Instructions (Signed)
Your physician recommends that you schedule a follow-up appointment in: to be determined after blood work    Your physician recommends that you continue on your current medications as directed. Please refer to the Current Medication list given to you today.     Your physician recommends that you return for lab work in: FASTING         Thank you for choosing [facility] !

## 2015-09-24 DIAGNOSIS — I5032 Chronic diastolic (congestive) heart failure: Secondary | ICD-10-CM | POA: Diagnosis not present

## 2015-09-24 LAB — LIPID PANEL
Cholesterol: 131 mg/dL (ref 125–200)
HDL: 75 mg/dL (ref >=46)
LDL CALC: 40 mg/dL (ref <130)
Total CHOL/HDL Ratio: 1.7 Ratio (ref <=5.0)
Triglycerides: 80 mg/dL (ref <150)
VLDL: 16 mg/dL (ref <30)

## 2015-09-24 LAB — CBC
HEMATOCRIT: 37.8 % (ref 35.0–45.0)
Hemoglobin: 12 g/dL (ref 11.7–15.5)
MCH: 28 pg (ref 27.0–33.0)
MCHC: 31.7 g/dL — ABNORMAL LOW (ref 32.0–36.0)
MCV: 88.3 fL (ref 80.0–100.0)
MPV: 9.3 fL (ref 7.5–12.5)
Platelets: 162 10*3/uL (ref 140–400)
RBC: 4.28 MIL/uL (ref 3.80–5.10)
RDW: 15.3 % — ABNORMAL HIGH (ref 11.0–15.0)
WBC: 6.4 10*3/uL (ref 3.8–10.8)

## 2015-09-24 LAB — BASIC METABOLIC PANEL
BUN: 31 mg/dL — AB (ref 7–25)
CALCIUM: 9.6 mg/dL (ref 8.6–10.4)
CHLORIDE: 96 mmol/L — AB (ref 98–110)
CO2: 36 mmol/L — AB (ref 20–31)
Creat: 1.3 mg/dL — ABNORMAL HIGH (ref 0.60–0.88)
Glucose, Bld: 93 mg/dL (ref 65–99)
Potassium: 4.2 mmol/L (ref 3.5–5.3)
Sodium: 141 mmol/L (ref 135–146)

## 2015-09-25 LAB — BRAIN NATRIURETIC PEPTIDE: Brain Natriuretic Peptide: 331.1 pg/mL — ABNORMAL HIGH (ref <100)

## 2015-09-26 ENCOUNTER — Telehealth: Payer: Self-pay

## 2015-09-26 DIAGNOSIS — I272 Pulmonary hypertension, unspecified: Secondary | ICD-10-CM

## 2015-09-26 NOTE — Telephone Encounter (Signed)
Order placed for CT,lm for pt to call back

## 2015-09-26 NOTE — Telephone Encounter (Signed)
-----   Message from Dietrich PatesPaula Ross V, MD sent at 09/25/2015  9:39 PM EDT ----- CBC is OK Electrolytes are OK Fluid up a little Lpids are excellent  I would recomm CT angiogram of chest to evaluate pulmonary hypertension.

## 2015-09-26 NOTE — Telephone Encounter (Signed)
Chest ct sched for 5/4

## 2015-10-03 ENCOUNTER — Ambulatory Visit (HOSPITAL_COMMUNITY)
Admission: RE | Admit: 2015-10-03 | Discharge: 2015-10-03 | Disposition: A | Discharge status: Home / Self Care | Payer: Medicare Other | Source: Ambulatory Visit | Attending: Internal Medicine | Admitting: Internal Medicine

## 2015-10-03 DIAGNOSIS — I272 Other secondary pulmonary hypertension: Secondary | ICD-10-CM | POA: Diagnosis not present

## 2015-10-03 DIAGNOSIS — J984 Other disorders of lung: Secondary | ICD-10-CM | POA: Insufficient documentation

## 2015-10-03 DIAGNOSIS — J439 Emphysema, unspecified: Secondary | ICD-10-CM | POA: Diagnosis not present

## 2015-10-03 MED ORDER — IOPAMIDOL (ISOVUE-370) INJECTION 76%
75.0000 mL | Freq: Once | INTRAVENOUS | Status: AC | PRN
  Administered 2015-10-03: 100 mL via INTRAVENOUS

## 2015-10-08 DIAGNOSIS — I739 Peripheral vascular disease, unspecified: Secondary | ICD-10-CM | POA: Diagnosis not present

## 2015-10-08 DIAGNOSIS — E1151 Type 2 diabetes mellitus with diabetic peripheral angiopathy without gangrene: Secondary | ICD-10-CM | POA: Diagnosis not present

## 2015-10-08 DIAGNOSIS — L603 Nail dystrophy: Secondary | ICD-10-CM | POA: Diagnosis not present

## 2015-10-09 ENCOUNTER — Telehealth: Payer: Self-pay | Admitting: Internal Medicine

## 2015-10-09 NOTE — Telephone Encounter (Signed)
Ida from Dr. Letitia NeriFanta's office called stating the pt has called her wanting to know if we were supposed to schedule her for a Holter Monitor and when she needs to have her f/u w/ Dr. Tenny Crawoss

## 2015-10-10 NOTE — Telephone Encounter (Signed)
Pt had CT and labs,already given results, pcp asking when f/u is needed,messaged Dr Tenny Crawoss

## 2015-10-11 ENCOUNTER — Telehealth: Payer: Self-pay

## 2015-10-11 NOTE — Telephone Encounter (Signed)
Per Dr Sloan Leiteross,she wants pt to fu with one of our docs here in the next few weeks as she does not come here often. I have messaged front office staff to schedule with doc who is next on new pt list.

## 2015-10-15 DIAGNOSIS — I509 Heart failure, unspecified: Secondary | ICD-10-CM | POA: Diagnosis not present

## 2015-10-21 DIAGNOSIS — I10 Essential (primary) hypertension: Secondary | ICD-10-CM | POA: Diagnosis not present

## 2015-10-21 DIAGNOSIS — I5032 Chronic diastolic (congestive) heart failure: Secondary | ICD-10-CM | POA: Diagnosis not present

## 2015-10-30 ENCOUNTER — Telehealth: Payer: Self-pay

## 2015-10-30 NOTE — Telephone Encounter (Signed)
-----   Message from Vallarie MareHannah E Peters sent at 10/17/2015  8:52 AM EDT ----- Regarding: RE: when do u want to fu on this pt Called pt again 5/17--no answer   ----- Message -----    From: Nori Riisatherine A Carlton, RN    Sent: 10/11/2015   9:02 AM      To: Farrel Gordonerry L Goins, Hannah E Peters Subject: FW: when do u want to fu on this pt            Please sched fu in the next few weeks per PR with one of our docs here as she is not here enough, f/u tests  thanks ----- Message -----    From: Pricilla RifflePaula Ross V, MD    Sent: 10/11/2015   8:15 AM      To: Nori Riisatherine A Carlton, RN Subject: FW: when do u want to fu on this pt            Next few wk ----- Message -----    From: Nori Riisatherine A Carlton, RN    Sent: 10/10/2015   4:07 PM      To: Pricilla RifflePaula Ross V, MD Subject: RE: when do u want to fu on this pt            When does she need to be seen ? ----- Message -----    From: Pricilla RifflePaula Ross V, MD    Sent: 10/10/2015   2:57 PM      To: Nori Riisatherine A Carlton, RN Subject: RE: when do u want to fu on this pt            I am not in clinic often enough  Would set f/u in [place] with another provider  ----- Message -----    From: Nori Riisatherine A Carlton, RN    Sent: 10/10/2015   9:41 AM      To: Pricilla RifflePaula Ross V, MD Subject: when do u want to fu on this pt                When do u want fu ?  ty

## 2015-10-30 NOTE — Telephone Encounter (Signed)
Mailed letter to pt

## 2015-11-21 DIAGNOSIS — I10 Essential (primary) hypertension: Secondary | ICD-10-CM | POA: Diagnosis not present

## 2015-11-21 DIAGNOSIS — I5032 Chronic diastolic (congestive) heart failure: Secondary | ICD-10-CM | POA: Diagnosis not present

## 2015-12-04 DIAGNOSIS — I5032 Chronic diastolic (congestive) heart failure: Secondary | ICD-10-CM | POA: Diagnosis not present

## 2015-12-04 DIAGNOSIS — G301 Alzheimer's disease with late onset: Secondary | ICD-10-CM | POA: Diagnosis not present

## 2015-12-04 DIAGNOSIS — I10 Essential (primary) hypertension: Secondary | ICD-10-CM | POA: Diagnosis not present

## 2015-12-04 DIAGNOSIS — J449 Chronic obstructive pulmonary disease, unspecified: Secondary | ICD-10-CM | POA: Diagnosis not present

## 2015-12-09 DIAGNOSIS — I5031 Acute diastolic (congestive) heart failure: Secondary | ICD-10-CM | POA: Diagnosis not present

## 2016-01-07 DIAGNOSIS — I739 Peripheral vascular disease, unspecified: Secondary | ICD-10-CM | POA: Diagnosis not present

## 2016-01-07 DIAGNOSIS — E1151 Type 2 diabetes mellitus with diabetic peripheral angiopathy without gangrene: Secondary | ICD-10-CM | POA: Diagnosis not present

## 2016-01-07 DIAGNOSIS — L603 Nail dystrophy: Secondary | ICD-10-CM | POA: Diagnosis not present

## 2016-02-04 DIAGNOSIS — I10 Essential (primary) hypertension: Secondary | ICD-10-CM | POA: Diagnosis not present

## 2016-02-04 DIAGNOSIS — I5032 Chronic diastolic (congestive) heart failure: Secondary | ICD-10-CM | POA: Diagnosis not present

## 2016-03-04 DIAGNOSIS — J449 Chronic obstructive pulmonary disease, unspecified: Secondary | ICD-10-CM | POA: Diagnosis not present

## 2016-03-05 DIAGNOSIS — J449 Chronic obstructive pulmonary disease, unspecified: Secondary | ICD-10-CM | POA: Diagnosis not present

## 2016-03-05 DIAGNOSIS — I10 Essential (primary) hypertension: Secondary | ICD-10-CM | POA: Diagnosis not present

## 2016-03-31 DIAGNOSIS — E1142 Type 2 diabetes mellitus with diabetic polyneuropathy: Secondary | ICD-10-CM | POA: Diagnosis not present

## 2016-04-06 DIAGNOSIS — I10 Essential (primary) hypertension: Secondary | ICD-10-CM | POA: Diagnosis not present

## 2016-04-06 DIAGNOSIS — I5032 Chronic diastolic (congestive) heart failure: Secondary | ICD-10-CM | POA: Diagnosis not present

## 2016-05-06 DIAGNOSIS — I5032 Chronic diastolic (congestive) heart failure: Secondary | ICD-10-CM | POA: Diagnosis not present

## 2016-05-06 DIAGNOSIS — J449 Chronic obstructive pulmonary disease, unspecified: Secondary | ICD-10-CM | POA: Diagnosis not present

## 2016-06-04 DIAGNOSIS — I5032 Chronic diastolic (congestive) heart failure: Secondary | ICD-10-CM | POA: Diagnosis not present

## 2016-06-04 DIAGNOSIS — J449 Chronic obstructive pulmonary disease, unspecified: Secondary | ICD-10-CM | POA: Diagnosis not present

## 2016-06-09 DIAGNOSIS — Z0000 Encounter for general adult medical examination without abnormal findings: Secondary | ICD-10-CM | POA: Diagnosis not present

## 2016-06-09 DIAGNOSIS — R0902 Hypoxemia: Secondary | ICD-10-CM | POA: Diagnosis not present

## 2016-06-09 DIAGNOSIS — I6789 Other cerebrovascular disease: Secondary | ICD-10-CM | POA: Diagnosis not present

## 2016-06-09 DIAGNOSIS — I10 Essential (primary) hypertension: Secondary | ICD-10-CM | POA: Diagnosis not present

## 2016-06-09 DIAGNOSIS — J449 Chronic obstructive pulmonary disease, unspecified: Secondary | ICD-10-CM | POA: Diagnosis not present

## 2016-06-09 DIAGNOSIS — I5032 Chronic diastolic (congestive) heart failure: Secondary | ICD-10-CM | POA: Diagnosis not present

## 2016-06-09 DIAGNOSIS — J9611 Chronic respiratory failure with hypoxia: Secondary | ICD-10-CM | POA: Diagnosis not present

## 2016-06-09 DIAGNOSIS — M1990 Unspecified osteoarthritis, unspecified site: Secondary | ICD-10-CM | POA: Diagnosis not present

## 2016-06-09 DIAGNOSIS — G301 Alzheimer's disease with late onset: Secondary | ICD-10-CM | POA: Diagnosis not present

## 2016-06-26 DIAGNOSIS — J449 Chronic obstructive pulmonary disease, unspecified: Secondary | ICD-10-CM | POA: Diagnosis not present

## 2016-06-30 DIAGNOSIS — L603 Nail dystrophy: Secondary | ICD-10-CM | POA: Diagnosis not present

## 2016-06-30 DIAGNOSIS — E1151 Type 2 diabetes mellitus with diabetic peripheral angiopathy without gangrene: Secondary | ICD-10-CM | POA: Diagnosis not present

## 2016-06-30 DIAGNOSIS — I739 Peripheral vascular disease, unspecified: Secondary | ICD-10-CM | POA: Diagnosis not present

## 2016-07-03 DIAGNOSIS — I10 Essential (primary) hypertension: Secondary | ICD-10-CM | POA: Diagnosis not present

## 2016-07-03 DIAGNOSIS — I5032 Chronic diastolic (congestive) heart failure: Secondary | ICD-10-CM | POA: Diagnosis not present

## 2016-07-13 DIAGNOSIS — I10 Essential (primary) hypertension: Secondary | ICD-10-CM | POA: Diagnosis not present

## 2016-07-13 DIAGNOSIS — G301 Alzheimer's disease with late onset: Secondary | ICD-10-CM | POA: Diagnosis not present

## 2016-07-13 DIAGNOSIS — I5032 Chronic diastolic (congestive) heart failure: Secondary | ICD-10-CM | POA: Diagnosis not present

## 2016-07-21 ENCOUNTER — Ambulatory Visit (INDEPENDENT_AMBULATORY_CARE_PROVIDER_SITE_OTHER): Payer: Medicare Other | Admitting: Adult Health

## 2016-07-21 ENCOUNTER — Other Ambulatory Visit (HOSPITAL_COMMUNITY)
Admission: RE | Admit: 2016-07-21 | Discharge: 2016-07-21 | Disposition: A | Discharge status: Home / Self Care | Payer: Medicare Other | Source: Ambulatory Visit | Attending: Adult Health | Admitting: Adult Health

## 2016-07-21 ENCOUNTER — Encounter: Payer: Self-pay | Admitting: Adult Health

## 2016-07-21 VITALS — BP 104/56 | HR 75 | Ht 65.0 in | Wt 180.0 lb

## 2016-07-21 DIAGNOSIS — I10 Essential (primary) hypertension: Secondary | ICD-10-CM | POA: Diagnosis not present

## 2016-07-21 DIAGNOSIS — Z79899 Other long term (current) drug therapy: Secondary | ICD-10-CM

## 2016-07-21 DIAGNOSIS — R5381 Other malaise: Secondary | ICD-10-CM | POA: Diagnosis not present

## 2016-07-21 DIAGNOSIS — I5032 Chronic diastolic (congestive) heart failure: Secondary | ICD-10-CM | POA: Diagnosis not present

## 2016-07-21 LAB — BASIC METABOLIC PANEL
Anion gap: 9 (ref 5–15)
BUN: 30 mg/dL — AB (ref 6–20)
CHLORIDE: 97 mmol/L — AB (ref 101–111)
CO2: 36 mmol/L — ABNORMAL HIGH (ref 22–32)
Calcium: 9.4 mg/dL (ref 8.9–10.3)
Creatinine, Ser: 1.43 mg/dL — ABNORMAL HIGH (ref 0.44–1.00)
GFR, EST AFRICAN AMERICAN: 38 mL/min — AB (ref >60)
GFR, EST NON AFRICAN AMERICAN: 33 mL/min — AB (ref >60)
Glucose, Bld: 121 mg/dL — ABNORMAL HIGH (ref 65–99)
Potassium: 4.4 mmol/L (ref 3.5–5.1)
SODIUM: 142 mmol/L (ref 135–145)

## 2016-07-21 NOTE — Progress Notes (Signed)
Name: Charlton HawsMary A Rodelo    DOB: 10/30/1929  Age: 83 y.o.  MR#: [id number]       PCP:  Avon GullyFANTA,TESFAYE, MD      Insurance: Payor: MEDICARE / Plan: MEDICARE PART A AND B / Product Type: *No Product type* /   CC:   No chief complaint on file.   VS Vitals:   07/21/16 1500  BP: (!) 104/56  Pulse: 75  Weight: 180 lb (81.6 kg)  Height: 5\' 5"  (1.651 m)    Weights Current Weight  07/21/16 180 lb (81.6 kg)  09/23/15 184 lb (83.5 kg)  03/06/13 213 lb 10 oz (96.9 kg)    Blood Pressure  BP Readings from Last 3 Encounters:  07/21/16 (!) 104/56  09/23/15 126/60  03/06/13 123/61     Admit date:  (Not on file) Last encounter with RMR:  Visit date not found   Allergy Erythromycin and Penicillins  Current Outpatient Prescriptions  Medication Sig Dispense Refill  . albuterol (PROVENTIL HFA;VENTOLIN HFA) 108 (90 BASE) MCG/ACT inhaler Inhale 2 puffs into the lungs every 6 (six) hours as needed for wheezing.    Marland Kitchen. albuterol (PROVENTIL) (2.5 MG/3ML) 0.083% nebulizer solution Take 2.5 mg by nebulization every 6 (six) hours as needed for wheezing.    Marland Kitchen. amLODipine-benazepril (LOTREL) 10-20 MG per capsule Take 1 capsule by mouth daily.    Marland Kitchen. aspirin 81 MG tablet Take 81 mg by mouth daily.      Marland Kitchen. atorvastatin (LIPITOR) 20 MG tablet Take 20 mg by mouth daily at 6 PM.    . docusate sodium (COLACE) 100 MG capsule Take 200 mg by mouth at bedtime.    . donepezil (ARICEPT) 10 MG tablet Take 10 mg by mouth at bedtime.    . famotidine (PEPCID) 20 MG tablet Take 20 mg by mouth daily.    . mirtazapine (REMERON) 30 MG tablet Take 30 mg by mouth at bedtime.    . mometasone-formoterol (DULERA) 200-5 MCG/ACT AERO Inhale 2 puffs into the lungs 2 (two) times daily. 1 Inhaler 3  . Multiple Vitamin (MULTIVITAMIN WITH MINERALS) TABS tablet Take 1 tablet by mouth daily.    Marland Kitchen. oxybutynin (DITROPAN) 5 MG tablet Take 5 mg by mouth daily.    . potassium chloride (KLOR-CON) 20 MEQ packet Take 60 mEq by mouth every other day.    .  torsemide (DEMADEX) 20 MG tablet Take 60 mg by mouth every other day.    . traMADol (ULTRAM) 50 MG tablet Take 50 mg by mouth 3 (three) times daily as needed for pain (Takes one tablet every morning and evening.).     No current facility-administered medications for this visit.     Discontinued Meds:    Medications Discontinued During This Encounter  Medication Reason  . potassium chloride (K-DUR) 10 MEQ tablet Error  . torsemide (DEMADEX) 20 MG tablet Error  . pravastatin (PRAVACHOL) 80 MG tablet Error    Patient Active Problem List   Diagnosis Date Noted  . Cardiorenal syndrome with renal failure 03/02/2013  . Acute renal failure (ARF) (HCC) 02/28/2013  . Acute hyperkalemia 02/28/2013  . Acute respiratory failure with hypoxia (HCC) 02/28/2013  . Acute diastolic heart failure (HCC) 02/28/2013  . CKD (chronic kidney disease) stage 3, GFR 30-59 ml/min 02/28/2013  . Hypoglycemia 02/28/2013  . Acute on chronic diastolic heart failure (HCC) 02/02/2013  . Impingement syndrome of left shoulder 09/21/2012  . Rotator cuff tear arthropathy 09/21/2012  . Arthritis of shoulder region, degenerative 09/21/2012  .  Arthritis of hand, degenerative 02/17/2011  . Arthritis of wrist, left, degenerative 01/06/2011  . WRIST PAIN, LEFT 04/15/2010  . SHOULDER PAIN 11/08/2008  . IMPINGEMENT SYNDROME 11/08/2008  . TRIGGER FINGER, RIGHT THUMB 12/28/2006  . DEPRESSION 11/29/2006  . DEMENTIA, MILD 11/05/2006  . CONSTIPATION, DRUG INDUCED 10/08/2006  . VERTIGO 09/10/2006  . HYPERLIPIDEMIA 04/27/2006  . SYNDROME, CARPAL TUNNEL 04/27/2006  . HYPERTENSION 04/27/2006  . ALLERGIC RHINITIS 04/27/2006  . BRONCHITIS, CHRONIC NOS 04/27/2006  . ASTHMA 04/27/2006  . GERD 04/27/2006  . FIBROCYSTIC BREAST DISEASE 04/27/2006  . URINARY INCONTINENCE 04/27/2006  . CEREBROVASCULAR ACCIDENT, HX OF 04/27/2006    LABS    Component Value Date/Time   NA 141 09/23/2015 1223   NA 145 03/06/2013 0535   NA 143  03/05/2013 0930   K 4.2 09/23/2015 1223   K 4.4 03/06/2013 0535   K 4.2 03/05/2013 0930   CL 96 (L) 09/23/2015 1223   CL 101 03/06/2013 0535   CL 98 03/05/2013 0930   CO2 36 (H) 09/23/2015 1223   CO2 38 (H) 03/06/2013 0535   CO2 41 (HH) 03/05/2013 0930   GLUCOSE 93 09/23/2015 1223   GLUCOSE 96 03/06/2013 0535   GLUCOSE 112 (H) 03/05/2013 0930   BUN 31 (H) 09/23/2015 1223   BUN 32 (H) 03/06/2013 0535   BUN 33 (H) 03/05/2013 0930   CREATININE 1.30 (H) 09/23/2015 1223   CREATININE 1.06 03/06/2013 0535   CREATININE 1.08 03/05/2013 0930   CREATININE 1.09 03/05/2013 0410   CALCIUM 9.6 09/23/2015 1223   CALCIUM 9.0 03/06/2013 0535   CALCIUM 9.2 03/05/2013 0930   GFRNONAA 48 (L) 03/06/2013 0535   GFRNONAA 47 (L) 03/05/2013 0930   GFRNONAA 46 (L) 03/05/2013 0410   GFRAA 56 (L) 03/06/2013 0535   GFRAA 54 (L) 03/05/2013 0930   GFRAA 54 (L) 03/05/2013 0410   CMP     Component Value Date/Time   NA 141 09/23/2015 1223   K 4.2 09/23/2015 1223   CL 96 (L) 09/23/2015 1223   CO2 36 (H) 09/23/2015 1223   GLUCOSE 93 09/23/2015 1223   BUN 31 (H) 09/23/2015 1223   CREATININE 1.30 (H) 09/23/2015 1223   CALCIUM 9.6 09/23/2015 1223   PROT 7.0 09/07/2008 0352   ALBUMIN 4.0 09/07/2008 0352   AST 20 09/07/2008 0352   ALT 15 09/07/2008 0352   ALKPHOS 86 09/07/2008 0352   BILITOT 0.4 09/07/2008 0352   GFRNONAA 48 (L) 03/06/2013 0535   GFRAA 56 (L) 03/06/2013 0535       Component Value Date/Time   WBC 6.4 09/23/2015 1223   WBC 8.6 02/28/2013 0945   WBC 7.3 02/27/2013 2158   HGB 12.0 09/23/2015 1223   HGB 11.8 (L) 02/28/2013 0945   HGB 12.2 02/28/2013 0550   HCT 37.8 09/23/2015 1223   HCT 33.7 (L) 02/28/2013 0945   HCT 36.0 02/28/2013 0550   MCV 88.3 09/23/2015 1223   MCV 77.3 (L) 02/28/2013 0945   MCV 79.7 02/27/2013 2158    Lipid Panel     Component Value Date/Time   CHOL 131 09/23/2015 1223   TRIG 80 09/23/2015 1223   HDL 75 09/23/2015 1223   CHOLHDL 1.7 09/23/2015 1223    VLDL 16 09/23/2015 1223   LDLCALC 40 09/23/2015 1223    ABG    Component Value Date/Time   PHART 7.315 (L) 02/28/2013 1024   PCO2ART 57.1 (HH) 02/28/2013 1024   PO2ART 65.0 (L) 02/28/2013 1024   HCO3 29.2 (H) 02/28/2013  1024   TCO2 31 02/28/2013 1024   O2SAT 90.0 02/28/2013 1024     Lab Results  Component Value Date   TSH 1.445 02/28/2013   BNP (last 3 results)  Recent Labs  09/23/15 1223  BNP 331.1*    ProBNP (last 3 results) No results for input(s): PROBNP in the last 8760 hours.  Cardiac Panel (last 3 results) No results for input(s): CKTOTAL, CKMB, TROPONINI, RELINDX in the last 72 hours.  Iron/TIBC/Ferritin/ %Sat No results found for: IRON, TIBC, FERRITIN, IRONPCTSAT   EKG Orders placed or performed in visit on 09/23/15  . EKG 12-Lead     Prior Assessment and Plan Problem List as of 07/21/2016 Reviewed: 02/28/2013  3:54 AM by Houston Siren, MD     Cardiovascular and Mediastinum   HYPERTENSION   Acute on chronic diastolic heart failure (HCC)   Acute diastolic heart failure (HCC)   Cardiorenal syndrome with renal failure     Respiratory   ALLERGIC RHINITIS   BRONCHITIS, CHRONIC NOS   Acute respiratory failure with hypoxia (HCC)     Digestive   GERD   CONSTIPATION, DRUG INDUCED     Endocrine   Hypoglycemia     Nervous and Auditory   DEMENTIA, MILD   SYNDROME, CARPAL TUNNEL     Musculoskeletal and Integument   TRIGGER FINGER, RIGHT THUMB   Arthritis of wrist, left, degenerative   Arthritis of hand, degenerative   Rotator cuff tear arthropathy   Arthritis of shoulder region, degenerative     Genitourinary   Acute renal failure (ARF) (HCC)   CKD (chronic kidney disease) stage 3, GFR 30-59 ml/min     Other   HYPERLIPIDEMIA   DEPRESSION   ASTHMA   FIBROCYSTIC BREAST DISEASE   SHOULDER PAIN   WRIST PAIN, LEFT   IMPINGEMENT SYNDROME   VERTIGO   URINARY INCONTINENCE   CEREBROVASCULAR ACCIDENT, HX OF   Impingement syndrome of left shoulder    Acute hyperkalemia       Imaging: No results found.

## 2016-07-21 NOTE — Patient Instructions (Signed)
Your physician recommends that you schedule a follow-up appointment in: 3 Months with Dr. Tenny Crawoss  Your physician recommends that you continue on your current medications as directed. Please refer to the Current Medication list given to you today.  Your physician recommends that you return for lab work.   If you need a refill on your cardiac medications before your next appointment, please call your pharmacy.  You have been given a Rx for TED hose   Thank you for choosing [facility]!

## 2016-07-21 NOTE — Progress Notes (Signed)
Cardiology Office Note   Date:  07/21/2016   ID:  Brittney Li, DOB [birth year], MRN [id number]  PCP:  Avon Gully, MD  Cardiologist: Ross/  Brittney Reining, NP   No chief complaint on file.    History of Present Illness: Brittney Li is a 83 y.o. female who presents for ongoing assessment and management of diastolic heart failure, (grade 2), with other history to include hypertension, pulmonary hypertension, and O2 dependent COPD. The patient was last seen by Dr. Tenny Craw on 09/23/2015. At that time the patient is clinically stable no changes in medication with a weight of 184 pounds.  Seen by Dr. Felecia Shelling, PCP, on 07/13/2016 with worsening bilateral leg edema. Torsemide was increased to 40 mg BID alternating with 20 mg BID. She is without complaint today. She continues to have dependent edema and is wheelchair-bound currently. She is very sedentary. She states that her weight is been maintained around 180 pounds at home. Nurses aide confirms that she is taking her medications and avoiding salted foods*she knows. The patient complains of generalized deconditioning.  Past Medical History:  Diagnosis Date  . Arthritis   . Asthma   . Bronchitis   . CHF (congestive heart failure) (HCC)    a. ECHO (01/2013) EF 60-65%, grade II diastolic dysfx, LA mildly dilated  . COPD (chronic obstructive pulmonary disease) (HCC)   . Diabetes mellitus without complication (HCC)   . Hypertension     Past Surgical History:  Procedure Laterality Date  . ABDOMINAL HYSTERECTOMY  1975  . hands       Current Outpatient Prescriptions  Medication Sig Dispense Refill  . albuterol (PROVENTIL HFA;VENTOLIN HFA) 108 (90 BASE) MCG/ACT inhaler Inhale 2 puffs into the lungs every 6 (six) hours as needed for wheezing.    Marland Kitchen albuterol (PROVENTIL) (2.5 MG/3ML) 0.083% nebulizer solution Take 2.5 mg by nebulization every 6 (six) hours as needed for wheezing.    Marland Kitchen amLODipine-benazepril (LOTREL) 10-20 MG per capsule Take 1  capsule by mouth daily.    Marland Kitchen aspirin 81 MG tablet Take 81 mg by mouth daily.      Marland Kitchen atorvastatin (LIPITOR) 20 MG tablet Take 20 mg by mouth daily at 6 PM.    . docusate sodium (COLACE) 100 MG capsule Take 200 mg by mouth at bedtime.    . donepezil (ARICEPT) 10 MG tablet Take 10 mg by mouth at bedtime.    . famotidine (PEPCID) 20 MG tablet Take 20 mg by mouth daily.    . mirtazapine (REMERON) 30 MG tablet Take 30 mg by mouth at bedtime.    . mometasone-formoterol (DULERA) 200-5 MCG/ACT AERO Inhale 2 puffs into the lungs 2 (two) times daily. 1 Inhaler 3  . Multiple Vitamin (MULTIVITAMIN WITH MINERALS) TABS tablet Take 1 tablet by mouth daily.    Marland Kitchen oxybutynin (DITROPAN) 5 MG tablet Take 5 mg by mouth daily.    . potassium chloride (KLOR-CON) 20 MEQ packet Take 60 mEq by mouth every other day.    . torsemide (DEMADEX) 20 MG tablet Take 60 mg by mouth every other day.    . traMADol (ULTRAM) 50 MG tablet Take 50 mg by mouth 3 (three) times daily as needed for pain (Takes one tablet every morning and evening.).     No current facility-administered medications for this visit.     Allergies:   Erythromycin and Penicillins    Social History:  The patient  reports that she has quit smoking. She has never used smokeless tobacco.  She reports that she does not drink alcohol or use drugs.   Family History:  The patient's family history is not on file.    ROS: All other systems are reviewed and negative. Unless otherwise mentioned in H&P    PHYSICAL EXAM: VS:  BP (!) 104/56   Pulse 75   Ht 5\' 5"  (1.651 m)   Wt 180 lb (81.6 kg)   BMI 29.95 kg/m  , BMI Body mass index is 29.95 kg/m. GEN: Well nourished, well developed, in no acute distress  HEENT: normal  Neck: no JVD, carotid bruits, or masses Cardiac: RRR; no murmurs, rubs, or gallops,Bilateral lower extremity dependent edema. Left greater than right. Respiratory:  clear to auscultation bilaterally, normal work of breathing GI: soft,  nontender, nondistended, + BS MS: no deformity or atrophy  Skin: warm and dry, no rash Neuro:  Strength and sensation are intact Psych: euthymic mood, full affect  Recent Labs: 09/23/2015: Brain Natriuretic Peptide 331.1; BUN 31; Creat 1.30; Hemoglobin 12.0; Platelets 162; Potassium 4.2; Sodium 141    Lipid Panel    Component Value Date/Time   CHOL 131 09/23/2015 1223   TRIG 80 09/23/2015 1223   HDL 75 09/23/2015 1223   CHOLHDL 1.7 09/23/2015 1223   VLDL 16 09/23/2015 1223   LDLCALC 40 09/23/2015 1223      Wt Readings from Last 3 Encounters:  07/21/16 180 lb (81.6 kg)  09/23/15 184 lb (83.5 kg)  03/06/13 213 lb 10 oz (96.9 kg)      Other studies Reviewed: Echocardiogram September 21, 2015 Left ventricle: The cavity size was normal. Wall thickness was   increased in a pattern of moderate LVH. Systolic function was   normal. The estimated ejection fraction was in the range of 55%   to 60%. Wall motion was normal; there were no regional wall   motion abnormalities. Features are consistent with a pseudonormal   left ventricular filling pattern, with concomitant abnormal   relaxation and increased filling pressure (grade 2 diastolic   dysfunction). Doppler parameters are consistent with high   ventricular filling pressure. - Aortic valve: Mildly to moderately calcified annulus. Trileaflet. - Mitral valve: There was trivial regurgitation. - Left atrium: The atrium was moderately to severely dilated. - Right ventricle: Systolic function was mildly reduced. - Right atrium: The atrium was moderately dilated. - Tricuspid valve: Mildly thickened leaflets. There was mild   regurgitation. - Pulmonary arteries: Systolic pressure was mildly increased. PA   peak pressure: 40 mm Hg (S).   ASSESSMENT AND PLAN:  1.  Chronic diastolic heart failure:  The patient's weight has been maintained, she continues to have dependent edema. She is very sedentary. She is currently on increased dose of  torsemide 40 mg twice a day, increase in 60 mg every other day. Follow-up BMET has been ordered. I'll also advised that she have knee-high TED hose placed to assist with dependent edema.  2. Hypertension: Blood pressure is soft today. Continue to monitor. May need to adjust medications. Would prefer to be a little lower as she is not as active right now. I've advised her to be careful when getting up from a sitting to a standing position. She has a nurse's aide that helps her with her ADLs and assistance with ambulation.  3. Deconditioning: The patient is a question outpatient physical therapy. We'll defer to primary care. I believe this will be beneficial to her. The more active she is hopefully her strength will continue to improve.   Current medicines  are reviewed at length with the patient today.    Labs/ tests ordered today include:   Orders Placed This Encounter  Procedures  . Basic Metabolic Panel (BMET)     Disposition:   FU with cardiology in 3 months.   Signed, Brittney ReiningKathryn Lawrence, NP  07/21/2016 5:00 PM    [facility] [address], [place], [place] [address] Phone: [phone]; Fax: [phone]

## 2016-07-22 ENCOUNTER — Telehealth: Payer: Self-pay | Admitting: *Deleted

## 2016-07-22 MED ORDER — TORSEMIDE 20 MG PO TABS: ORAL_TABLET | ORAL | 6 refills | Status: DC

## 2016-07-22 NOTE — Telephone Encounter (Signed)
-----   Message from Jodelle GrossKathryn M Lawrence, NP sent at 07/21/2016  5:44 PM EST ----- Creatinine rising. Decrease torsemide to 60 mg in the am and 40 mg in pm.

## 2016-07-22 NOTE — Telephone Encounter (Signed)
Patient's nurse called back and states that she is only taking Torsemide and Potassium  1 Tablet 2 times Daily every other day. Not like pt reported in office.

## 2016-07-23 NOTE — Telephone Encounter (Signed)
Noted, thank you

## 2016-08-10 DIAGNOSIS — I5032 Chronic diastolic (congestive) heart failure: Secondary | ICD-10-CM | POA: Diagnosis not present

## 2016-08-10 DIAGNOSIS — I10 Essential (primary) hypertension: Secondary | ICD-10-CM | POA: Diagnosis not present

## 2016-08-19 ENCOUNTER — Encounter: Payer: Self-pay | Admitting: *Deleted

## 2016-08-19 ENCOUNTER — Telehealth: Payer: Self-pay | Admitting: *Deleted

## 2016-08-19 DIAGNOSIS — I5033 Acute on chronic diastolic (congestive) heart failure: Secondary | ICD-10-CM

## 2016-08-19 DIAGNOSIS — Z79899 Other long term (current) drug therapy: Secondary | ICD-10-CM

## 2016-08-19 MED ORDER — TORSEMIDE 20 MG PO TABS: 40.0000 mg | ORAL_TABLET | Freq: Two times a day (BID) | ORAL | 6 refills | Status: DC

## 2016-08-19 NOTE — Telephone Encounter (Signed)
Pt notified of medication change and the need to do lab work in 4 days

## 2016-08-21 ENCOUNTER — Telehealth: Payer: Self-pay | Admitting: *Deleted

## 2016-08-21 ENCOUNTER — Other Ambulatory Visit (HOSPITAL_COMMUNITY)
Admission: RE | Admit: 2016-08-21 | Discharge: 2016-08-21 | Disposition: A | Discharge status: Home / Self Care | Payer: Medicare Other | Source: Ambulatory Visit | Attending: Adult Health | Admitting: Adult Health

## 2016-08-21 DIAGNOSIS — I5032 Chronic diastolic (congestive) heart failure: Secondary | ICD-10-CM | POA: Insufficient documentation

## 2016-08-21 DIAGNOSIS — Z79899 Other long term (current) drug therapy: Secondary | ICD-10-CM | POA: Diagnosis not present

## 2016-08-21 LAB — BASIC METABOLIC PANEL
Anion gap: 7 (ref 5–15)
BUN: 32 mg/dL — AB (ref 6–20)
CHLORIDE: 98 mmol/L — AB (ref 101–111)
CO2: 38 mmol/L — AB (ref 22–32)
CREATININE: 1.41 mg/dL — AB (ref 0.44–1.00)
Calcium: 9.4 mg/dL (ref 8.9–10.3)
GFR calc Af Amer: 38 mL/min — ABNORMAL LOW (ref >60)
GFR calc non Af Amer: 33 mL/min — ABNORMAL LOW (ref >60)
GLUCOSE: 93 mg/dL (ref 65–99)
Potassium: 4.5 mmol/L (ref 3.5–5.1)
Sodium: 143 mmol/L (ref 135–145)

## 2016-08-21 NOTE — Telephone Encounter (Signed)
-----   Message from Jodelle Gross, NP sent at 08/21/2016  3:23 PM EDT ----- No changes in regimen.

## 2016-08-21 NOTE — Telephone Encounter (Signed)
Called patient with test results. No answer. Left message to call back.  

## 2016-09-10 DIAGNOSIS — I5032 Chronic diastolic (congestive) heart failure: Secondary | ICD-10-CM | POA: Diagnosis not present

## 2016-09-10 DIAGNOSIS — I10 Essential (primary) hypertension: Secondary | ICD-10-CM | POA: Diagnosis not present

## 2016-09-11 NOTE — Addendum Note (Signed)
Addended by: Kerney Elbe on: 09/11/2016 01:03 PM   Modules accepted: Orders

## 2016-09-29 DIAGNOSIS — E1142 Type 2 diabetes mellitus with diabetic polyneuropathy: Secondary | ICD-10-CM | POA: Diagnosis not present

## 2016-10-10 DIAGNOSIS — I5032 Chronic diastolic (congestive) heart failure: Secondary | ICD-10-CM | POA: Diagnosis not present

## 2016-10-10 DIAGNOSIS — I10 Essential (primary) hypertension: Secondary | ICD-10-CM | POA: Diagnosis not present

## 2016-10-19 DIAGNOSIS — I5032 Chronic diastolic (congestive) heart failure: Secondary | ICD-10-CM | POA: Diagnosis not present

## 2016-11-18 ENCOUNTER — Encounter: Payer: Self-pay | Admitting: *Deleted

## 2016-11-18 NOTE — Progress Notes (Deleted)
   Cardiology Office Note   Date:  11/18/2016   ID:  Brittney Li, DOB 03/11/1930, MRN [id number]  PCP:  Avon GullyFanta, Tesfaye, MD  Cardiologist:   Dietrich PatesPaula Viola Placeres, MD       History of Present Illness: Brittney Li is a 83 y.o. female with a history of SOB   Echo showed mod LVH  LVEF 55 to 60^ with gr II diastolic dysfunction.  RVEF mildly reduced  I saw the tpt in clinic in April 2017   She wsa seen by Dr Felecia ShellingFanta in feb   Torsemided increased to 40 bid alternated with 20 bp       No outpatient prescriptions have been marked as taking for the 11/19/16 encounter (Appointment) with Pricilla Riffleoss, Garnet Overfield V, MD.     Allergies:   Erythromycin and Penicillins   Past Medical History:  Diagnosis Date  . Arthritis   . Asthma   . Bronchitis   . CHF (congestive heart failure) (HCC)    a. ECHO (01/2013) EF 60-65%, grade II diastolic dysfx, LA mildly dilated  . COPD (chronic obstructive pulmonary disease) (HCC)   . Diabetes mellitus without complication (HCC)   . Hypertension     Past Surgical History:  Procedure Laterality Date  . ABDOMINAL HYSTERECTOMY  1975  . hands       Social History:  The patient  reports that she has quit smoking. She has never used smokeless tobacco. She reports that she does not drink alcohol or use drugs.   Family History:  The patient's family history is not on file.    ROS:  Please see the history of present illness. All other systems are reviewed and  Negative to the above problem except as noted.    PHYSICAL EXAM: VS:  There were no vitals taken for this visit.  GEN: Well nourished, well developed, in no acute distress  HEENT: normal  Neck: no JVD, carotid bruits, or masses Cardiac: RRR; no murmurs, rubs, or gallops,no edema  Respiratory:  clear to auscultation bilaterally, normal work of breathing GI: soft, nontender, nondistended, + BS  No hepatomegaly  MS: no deformity Moving all extremities   Skin: warm and dry, no rash Neuro:  Strength and sensation  are intact Psych: euthymic mood, full affect   EKG:  EKG is ordered today.   Lipid Panel    Component Value Date/Time   CHOL 131 09/23/2015 1223   TRIG 80 09/23/2015 1223   HDL 75 09/23/2015 1223   CHOLHDL 1.7 09/23/2015 1223   VLDL 16 09/23/2015 1223   LDLCALC 40 09/23/2015 1223      Wt Readings from Last 3 Encounters:  07/21/16 81.6 kg (180 lb)  09/23/15 83.5 kg (184 lb)  03/06/13 96.9 kg (213 lb 10 oz)      ASSESSMENT AND PLAN:     Current medicines are reviewed at length with the patient today.  The patient does not have concerns regarding medicines.  Signed, Dietrich PatesPaula Aniya Jolicoeur, MD  11/18/2016 3:53 PM    [facility] [address], [place], [place]  [address] Phone: [phone]; Fax: [phone]

## 2016-11-19 ENCOUNTER — Encounter: Payer: Self-pay | Admitting: Internal Medicine

## 2016-11-19 ENCOUNTER — Ambulatory Visit: Payer: Medicare Other | Admitting: Internal Medicine

## 2016-11-19 DIAGNOSIS — I10 Essential (primary) hypertension: Secondary | ICD-10-CM | POA: Diagnosis not present

## 2016-11-19 DIAGNOSIS — I5032 Chronic diastolic (congestive) heart failure: Secondary | ICD-10-CM | POA: Diagnosis not present

## 2016-12-21 DIAGNOSIS — I10 Essential (primary) hypertension: Secondary | ICD-10-CM | POA: Diagnosis not present

## 2016-12-21 DIAGNOSIS — G301 Alzheimer's disease with late onset: Secondary | ICD-10-CM | POA: Diagnosis not present

## 2016-12-21 DIAGNOSIS — J9611 Chronic respiratory failure with hypoxia: Secondary | ICD-10-CM | POA: Diagnosis not present

## 2016-12-21 DIAGNOSIS — J449 Chronic obstructive pulmonary disease, unspecified: Secondary | ICD-10-CM | POA: Diagnosis not present

## 2016-12-21 DIAGNOSIS — R0902 Hypoxemia: Secondary | ICD-10-CM | POA: Diagnosis not present

## 2016-12-21 DIAGNOSIS — I5032 Chronic diastolic (congestive) heart failure: Secondary | ICD-10-CM | POA: Diagnosis not present

## 2016-12-21 DIAGNOSIS — M1990 Unspecified osteoarthritis, unspecified site: Secondary | ICD-10-CM | POA: Diagnosis not present

## 2016-12-21 DIAGNOSIS — I6789 Other cerebrovascular disease: Secondary | ICD-10-CM | POA: Diagnosis not present

## 2017-01-12 DIAGNOSIS — J9611 Chronic respiratory failure with hypoxia: Secondary | ICD-10-CM | POA: Diagnosis not present

## 2017-01-12 DIAGNOSIS — I959 Hypotension, unspecified: Secondary | ICD-10-CM | POA: Diagnosis not present

## 2017-01-12 DIAGNOSIS — J449 Chronic obstructive pulmonary disease, unspecified: Secondary | ICD-10-CM | POA: Diagnosis not present

## 2017-01-12 DIAGNOSIS — I5032 Chronic diastolic (congestive) heart failure: Secondary | ICD-10-CM | POA: Diagnosis not present

## 2017-02-03 DIAGNOSIS — I5032 Chronic diastolic (congestive) heart failure: Secondary | ICD-10-CM | POA: Diagnosis not present

## 2017-02-12 DIAGNOSIS — I10 Essential (primary) hypertension: Secondary | ICD-10-CM | POA: Diagnosis not present

## 2017-02-12 DIAGNOSIS — I5032 Chronic diastolic (congestive) heart failure: Secondary | ICD-10-CM | POA: Diagnosis not present

## 2017-03-14 DIAGNOSIS — I5032 Chronic diastolic (congestive) heart failure: Secondary | ICD-10-CM | POA: Diagnosis not present

## 2017-03-14 DIAGNOSIS — I10 Essential (primary) hypertension: Secondary | ICD-10-CM | POA: Diagnosis not present

## 2017-03-25 ENCOUNTER — Other Ambulatory Visit: Payer: Self-pay | Admitting: Adult Health

## 2017-04-05 DIAGNOSIS — I5032 Chronic diastolic (congestive) heart failure: Secondary | ICD-10-CM | POA: Diagnosis not present

## 2017-04-14 DIAGNOSIS — I5032 Chronic diastolic (congestive) heart failure: Secondary | ICD-10-CM | POA: Diagnosis not present

## 2017-04-14 DIAGNOSIS — I10 Essential (primary) hypertension: Secondary | ICD-10-CM | POA: Diagnosis not present

## 2017-04-29 ENCOUNTER — Other Ambulatory Visit: Payer: Self-pay | Admitting: Adult Health

## 2017-05-14 DIAGNOSIS — J449 Chronic obstructive pulmonary disease, unspecified: Secondary | ICD-10-CM | POA: Diagnosis not present

## 2017-05-14 DIAGNOSIS — E1165 Type 2 diabetes mellitus with hyperglycemia: Secondary | ICD-10-CM | POA: Diagnosis not present

## 2017-05-24 ENCOUNTER — Other Ambulatory Visit: Payer: Self-pay | Admitting: Adult Health

## 2017-06-23 ENCOUNTER — Emergency Department (HOSPITAL_COMMUNITY): Payer: Medicare Other

## 2017-06-23 ENCOUNTER — Inpatient Hospital Stay (HOSPITAL_COMMUNITY)
Admission: EM | Admit: 2017-06-23 | Discharge: 2017-06-28 | DRG: 291 | Disposition: A | Discharge status: Home Health | Payer: Medicare Other | Attending: Internal Medicine | Admitting: Internal Medicine

## 2017-06-23 ENCOUNTER — Other Ambulatory Visit: Payer: Self-pay

## 2017-06-23 ENCOUNTER — Other Ambulatory Visit: Payer: Self-pay | Admitting: Internal Medicine

## 2017-06-23 ENCOUNTER — Encounter (HOSPITAL_COMMUNITY): Payer: Self-pay

## 2017-06-23 DIAGNOSIS — Z881 Allergy status to other antibiotic agents status: Secondary | ICD-10-CM | POA: Diagnosis not present

## 2017-06-23 DIAGNOSIS — I1310 Hypertensive heart and chronic kidney disease without heart failure, with stage 1 through stage 4 chronic kidney disease, or unspecified chronic kidney disease: Secondary | ICD-10-CM

## 2017-06-23 DIAGNOSIS — K219 Gastro-esophageal reflux disease without esophagitis: Secondary | ICD-10-CM | POA: Diagnosis present

## 2017-06-23 DIAGNOSIS — N19 Unspecified kidney failure: Secondary | ICD-10-CM

## 2017-06-23 DIAGNOSIS — I10 Essential (primary) hypertension: Secondary | ICD-10-CM | POA: Diagnosis not present

## 2017-06-23 DIAGNOSIS — M1990 Unspecified osteoarthritis, unspecified site: Secondary | ICD-10-CM | POA: Diagnosis not present

## 2017-06-23 DIAGNOSIS — R0602 Shortness of breath: Secondary | ICD-10-CM

## 2017-06-23 DIAGNOSIS — M19042 Primary osteoarthritis, left hand: Secondary | ICD-10-CM | POA: Diagnosis present

## 2017-06-23 DIAGNOSIS — R739 Hyperglycemia, unspecified: Secondary | ICD-10-CM | POA: Diagnosis not present

## 2017-06-23 DIAGNOSIS — I110 Hypertensive heart disease with heart failure: Secondary | ICD-10-CM | POA: Diagnosis not present

## 2017-06-23 DIAGNOSIS — J449 Chronic obstructive pulmonary disease, unspecified: Secondary | ICD-10-CM | POA: Diagnosis present

## 2017-06-23 DIAGNOSIS — I340 Nonrheumatic mitral (valve) insufficiency: Secondary | ICD-10-CM | POA: Diagnosis present

## 2017-06-23 DIAGNOSIS — Z880 Allergy status to penicillin: Secondary | ICD-10-CM

## 2017-06-23 DIAGNOSIS — Z90710 Acquired absence of both cervix and uterus: Secondary | ICD-10-CM

## 2017-06-23 DIAGNOSIS — J9621 Acute and chronic respiratory failure with hypoxia: Secondary | ICD-10-CM | POA: Diagnosis not present

## 2017-06-23 DIAGNOSIS — J9601 Acute respiratory failure with hypoxia: Secondary | ICD-10-CM | POA: Diagnosis not present

## 2017-06-23 DIAGNOSIS — Z7982 Long term (current) use of aspirin: Secondary | ICD-10-CM

## 2017-06-23 DIAGNOSIS — I509 Heart failure, unspecified: Secondary | ICD-10-CM | POA: Diagnosis not present

## 2017-06-23 DIAGNOSIS — I5032 Chronic diastolic (congestive) heart failure: Secondary | ICD-10-CM | POA: Diagnosis not present

## 2017-06-23 DIAGNOSIS — Z9981 Dependence on supplemental oxygen: Secondary | ICD-10-CM | POA: Diagnosis not present

## 2017-06-23 DIAGNOSIS — E785 Hyperlipidemia, unspecified: Secondary | ICD-10-CM | POA: Diagnosis present

## 2017-06-23 DIAGNOSIS — I6789 Other cerebrovascular disease: Secondary | ICD-10-CM | POA: Diagnosis not present

## 2017-06-23 DIAGNOSIS — N6019 Diffuse cystic mastopathy of unspecified breast: Secondary | ICD-10-CM | POA: Diagnosis present

## 2017-06-23 DIAGNOSIS — R7989 Other specified abnormal findings of blood chemistry: Secondary | ICD-10-CM | POA: Diagnosis not present

## 2017-06-23 DIAGNOSIS — I5033 Acute on chronic diastolic (congestive) heart failure: Secondary | ICD-10-CM | POA: Diagnosis not present

## 2017-06-23 DIAGNOSIS — N183 Chronic kidney disease, stage 3 unspecified: Secondary | ICD-10-CM | POA: Diagnosis present

## 2017-06-23 DIAGNOSIS — M19019 Primary osteoarthritis, unspecified shoulder: Secondary | ICD-10-CM | POA: Diagnosis present

## 2017-06-23 DIAGNOSIS — Z0000 Encounter for general adult medical examination without abnormal findings: Secondary | ICD-10-CM | POA: Diagnosis not present

## 2017-06-23 DIAGNOSIS — E1122 Type 2 diabetes mellitus with diabetic chronic kidney disease: Secondary | ICD-10-CM | POA: Diagnosis present

## 2017-06-23 DIAGNOSIS — D696 Thrombocytopenia, unspecified: Secondary | ICD-10-CM | POA: Diagnosis present

## 2017-06-23 DIAGNOSIS — F0390 Unspecified dementia without behavioral disturbance: Secondary | ICD-10-CM | POA: Diagnosis present

## 2017-06-23 DIAGNOSIS — Z87891 Personal history of nicotine dependence: Secondary | ICD-10-CM

## 2017-06-23 DIAGNOSIS — I959 Hypotension, unspecified: Secondary | ICD-10-CM | POA: Diagnosis not present

## 2017-06-23 DIAGNOSIS — R0902 Hypoxemia: Secondary | ICD-10-CM | POA: Diagnosis not present

## 2017-06-23 DIAGNOSIS — E7849 Other hyperlipidemia: Secondary | ICD-10-CM | POA: Diagnosis not present

## 2017-06-23 DIAGNOSIS — I130 Hypertensive heart and chronic kidney disease with heart failure and stage 1 through stage 4 chronic kidney disease, or unspecified chronic kidney disease: Principal | ICD-10-CM | POA: Diagnosis present

## 2017-06-23 DIAGNOSIS — R0600 Dyspnea, unspecified: Secondary | ICD-10-CM | POA: Diagnosis not present

## 2017-06-23 DIAGNOSIS — J9611 Chronic respiratory failure with hypoxia: Secondary | ICD-10-CM | POA: Diagnosis not present

## 2017-06-23 LAB — CBC WITH DIFFERENTIAL/PLATELET
Basophils Absolute: 0 10*3/uL (ref 0.0–0.1)
Basophils Relative: 0 %
Eosinophils Absolute: 0.1 10*3/uL (ref 0.0–0.7)
Eosinophils Relative: 2 %
HCT: 38.6 % (ref 36.0–46.0)
Hemoglobin: 12.9 g/dL (ref 12.0–15.0)
Lymphocytes Relative: 17 %
Lymphs Abs: 0.9 10*3/uL (ref 0.7–4.0)
MCH: 28.9 pg (ref 26.0–34.0)
MCHC: 33.4 g/dL (ref 30.0–36.0)
MCV: 86.4 fL (ref 78.0–100.0)
Monocytes Absolute: 0.4 10*3/uL (ref 0.1–1.0)
Monocytes Relative: 8 %
Neutro Abs: 4 10*3/uL (ref 1.7–7.7)
Neutrophils Relative %: 73 %
Platelets: 46 10*3/uL — ABNORMAL LOW (ref 150–400)
RBC: 4.47 MIL/uL (ref 3.87–5.11)
RDW: 14.6 % (ref 11.5–15.5)
WBC: 5.4 10*3/uL (ref 4.0–10.5)

## 2017-06-23 LAB — BASIC METABOLIC PANEL
Anion gap: 11 (ref 5–15)
BUN: 61 mg/dL — ABNORMAL HIGH (ref 6–20)
CO2: 34 mmol/L — ABNORMAL HIGH (ref 22–32)
Calcium: 9.2 mg/dL (ref 8.9–10.3)
Chloride: 98 mmol/L — ABNORMAL LOW (ref 101–111)
Creatinine, Ser: 1.64 mg/dL — ABNORMAL HIGH (ref 0.44–1.00)
GFR calc Af Amer: 32 mL/min — ABNORMAL LOW (ref >60)
GFR calc non Af Amer: 27 mL/min — ABNORMAL LOW (ref >60)
Glucose, Bld: 97 mg/dL (ref 65–99)
Potassium: 5 mmol/L (ref 3.5–5.1)
Sodium: 143 mmol/L (ref 135–145)

## 2017-06-23 LAB — BRAIN NATRIURETIC PEPTIDE: B Natriuretic Peptide: 749 pg/mL — ABNORMAL HIGH (ref 0.0–100.0)

## 2017-06-23 LAB — TROPONIN I
TROPONIN I: 0.17 ng/mL — AB (ref <0.03)
TROPONIN I: 0.21 ng/mL — AB (ref <0.03)
Troponin I: 0.15 ng/mL (ref <0.03)

## 2017-06-23 MED ORDER — ALBUTEROL SULFATE (2.5 MG/3ML) 0.083% IN NEBU: 2.5000 mg | INHALATION_SOLUTION | Freq: Four times a day (QID) | RESPIRATORY_TRACT | Status: DC | PRN

## 2017-06-23 MED ORDER — ONDANSETRON HCL 4 MG/2ML IJ SOLN: 4.0000 mg | Freq: Four times a day (QID) | INTRAMUSCULAR | Status: DC | PRN

## 2017-06-23 MED ORDER — MIRTAZAPINE 30 MG PO TABS
30.0000 mg | ORAL_TABLET | Freq: Every day | ORAL | Status: DC
  Administered 2017-06-23 – 2017-06-27 (×5): 30 mg via ORAL
  Filled 2017-06-23 (×5): qty 1

## 2017-06-23 MED ORDER — POTASSIUM CHLORIDE 20 MEQ PO PACK
60.0000 meq | PACK | Freq: Two times a day (BID) | ORAL | Status: DC
  Administered 2017-06-23 – 2017-06-25 (×6): 60 meq via ORAL
  Filled 2017-06-23 (×6): qty 3

## 2017-06-23 MED ORDER — ACETAMINOPHEN 325 MG PO TABS: 650.0000 mg | ORAL_TABLET | ORAL | Status: DC | PRN

## 2017-06-23 MED ORDER — SODIUM CHLORIDE 0.9% FLUSH: 3.0000 mL | INTRAVENOUS | Status: DC | PRN

## 2017-06-23 MED ORDER — ASPIRIN 81 MG PO CHEW
81.0000 mg | CHEWABLE_TABLET | Freq: Every day | ORAL | Status: DC
  Administered 2017-06-23 – 2017-06-28 (×6): 81 mg via ORAL
  Filled 2017-06-23 (×6): qty 1

## 2017-06-23 MED ORDER — ALBUTEROL SULFATE HFA 108 (90 BASE) MCG/ACT IN AERS: 2.0000 | INHALATION_SPRAY | Freq: Four times a day (QID) | RESPIRATORY_TRACT | Status: DC | PRN

## 2017-06-23 MED ORDER — MOMETASONE FURO-FORMOTEROL FUM 200-5 MCG/ACT IN AERO
2.0000 | INHALATION_SPRAY | Freq: Two times a day (BID) | RESPIRATORY_TRACT | Status: DC
  Administered 2017-06-23: 2 via RESPIRATORY_TRACT
  Filled 2017-06-23: qty 8.8

## 2017-06-23 MED ORDER — DONEPEZIL HCL 5 MG PO TABS
10.0000 mg | ORAL_TABLET | Freq: Every day | ORAL | Status: DC
  Administered 2017-06-23 – 2017-06-27 (×5): 10 mg via ORAL
  Filled 2017-06-23 (×5): qty 2

## 2017-06-23 MED ORDER — BENAZEPRIL HCL 10 MG PO TABS
20.0000 mg | ORAL_TABLET | Freq: Every day | ORAL | Status: DC
  Administered 2017-06-23: 20 mg via ORAL
  Filled 2017-06-23 (×2): qty 2

## 2017-06-23 MED ORDER — OXYBUTYNIN CHLORIDE 5 MG PO TABS
5.0000 mg | ORAL_TABLET | Freq: Every day | ORAL | Status: DC
  Administered 2017-06-23 – 2017-06-28 (×6): 5 mg via ORAL
  Filled 2017-06-23 (×6): qty 1

## 2017-06-23 MED ORDER — FUROSEMIDE 10 MG/ML IJ SOLN
80.0000 mg | Freq: Once | INTRAMUSCULAR | Status: AC
  Administered 2017-06-23: 80 mg via INTRAVENOUS
  Filled 2017-06-23: qty 8

## 2017-06-23 MED ORDER — SODIUM CHLORIDE 0.9% FLUSH
3.0000 mL | Freq: Two times a day (BID) | INTRAVENOUS | Status: DC
  Administered 2017-06-24 – 2017-06-28 (×8): 3 mL via INTRAVENOUS

## 2017-06-23 MED ORDER — FUROSEMIDE 10 MG/ML IJ SOLN
80.0000 mg | Freq: Every day | INTRAMUSCULAR | Status: DC
  Filled 2017-06-23: qty 8

## 2017-06-23 MED ORDER — AMLODIPINE BESY-BENAZEPRIL HCL 10-20 MG PO CAPS: 1.0000 | ORAL_CAPSULE | Freq: Every day | ORAL | Status: DC

## 2017-06-23 MED ORDER — AMLODIPINE BESYLATE 5 MG PO TABS
10.0000 mg | ORAL_TABLET | Freq: Every day | ORAL | Status: DC
  Administered 2017-06-23: 10 mg via ORAL
  Filled 2017-06-23 (×2): qty 2

## 2017-06-23 MED ORDER — ATORVASTATIN CALCIUM 20 MG PO TABS
20.0000 mg | ORAL_TABLET | Freq: Every day | ORAL | Status: DC
  Administered 2017-06-23 – 2017-06-27 (×5): 20 mg via ORAL
  Filled 2017-06-23 (×5): qty 1

## 2017-06-23 MED ORDER — MOMETASONE FURO-FORMOTEROL FUM 200-5 MCG/ACT IN AERO
2.0000 | INHALATION_SPRAY | Freq: Two times a day (BID) | RESPIRATORY_TRACT | Status: DC
  Administered 2017-06-24 – 2017-06-28 (×9): 2 via RESPIRATORY_TRACT
  Filled 2017-06-23: qty 8.8

## 2017-06-23 MED ORDER — SODIUM CHLORIDE 0.9 % IV SOLN: 250.0000 mL | INTRAVENOUS | Status: DC | PRN

## 2017-06-23 NOTE — H&P (Signed)
History and Physical    Brittney Li VWU:[id number] DOB: Oct 27, 1929 DOA: 06/23/2017  PCP: Brittney Gully, Li  Patient coming from: Home  Chief Complaint: Shortness of breath and swelling  HPI: Brittney Li is a 84 y.o. female with medical history significant of diastolic congestive heart failure with normal EF back in 2017 per last echo, COPD, diabetes, hypertension, dementia noted in her chart, chronic respiratory failure on 3 L of oxygen chronically at home comes in from her primary care physician's office Brittney Li for hypoxia.  Patient reports she has had several days of worsening shortness of breath that is worse than her normal.  She is also had some increased lower extremity swelling.  She denies any chest pain.  She denies any nausea vomiting or diarrhea.  She denies any orthopnea or PND.  She was in Brittney Li office and was noted to be hypoxic in the 70s despite her 3 L so was sent to the emergency department.  She reports she takes her home diuretics as scheduled.  Patient's been given 80 of Lasix in the emergency department and is already urinated quite a bit and says that she is already feeling much better.  She is referred for admission for congestive heart failure exacerbation and acute on chronic hypoxic respiratory failure.  Review of Systems: As per HPI otherwise 10 point review of systems negative.   Past Medical History:  Diagnosis Date  . Arthritis   . Asthma   . Bronchitis   . CHF (congestive heart failure) (HCC)    a. ECHO (01/2013) EF 60-65%, grade II diastolic dysfx, LA mildly dilated  . COPD (chronic obstructive pulmonary disease) (HCC)   . Diabetes mellitus without complication (HCC)   . Hypertension     Past Surgical History:  Procedure Laterality Date  . ABDOMINAL HYSTERECTOMY  1975  . hands       reports that she has quit smoking. she has never used smokeless tobacco. She reports that she does not drink alcohol or use drugs.  Allergies  Allergen  Reactions  . Erythromycin Other (See Comments)    Unknown   . Penicillins Other (See Comments)    Unknown     Family History  Problem Relation Age of Onset  . Arthritis Unknown   . Asthma Unknown   . Diabetes Unknown     Prior to Admission medications   Medication Sig Start Date End Date Taking? Authorizing Provider  albuterol (PROVENTIL HFA;VENTOLIN HFA) 108 (90 BASE) MCG/ACT inhaler Inhale 2 puffs into the lungs every 6 (six) hours as needed for wheezing.   Yes Brittney Li  albuterol (PROVENTIL) (2.5 MG/3ML) 0.083% nebulizer solution Take 2.5 mg by nebulization every 6 (six) hours as needed for wheezing.   Yes Brittney Li  atorvastatin (LIPITOR) 20 MG tablet Take 20 mg by mouth daily at 6 PM.   Yes Brittney Li  lisinopril (PRINIVIL,ZESTRIL) 20 MG tablet Take 20 mg by mouth daily.   Yes Brittney Li  mometasone-formoterol (DULERA) 200-5 MCG/ACT AERO Inhale 2 puffs into the lungs 2 (two) times daily. 02/06/13  Yes Brittney Gully, Li  potassium chloride (KLOR-CON) 20 MEQ packet Take 60 mEq by mouth 2 (two) times daily.    Yes Brittney Li  torsemide (DEMADEX) 20 MG tablet TAKE 2 TABLETS BY MOUTH TWICE DAILY. 04/29/17  Yes Brittney Gross, Li  amLODipine-benazepril (LOTREL) 10-20 MG per capsule Take 1 capsule by mouth daily.    Brittney Li  aspirin 81 MG tablet Take 81 mg by mouth daily.      Brittney Li  docusate sodium (COLACE) 100 MG capsule Take 200 mg by mouth at bedtime.    Brittney Li  donepezil (ARICEPT) 10 MG tablet Take 10 mg by mouth at bedtime.    Brittney Li  famotidine (PEPCID) 20 MG tablet Take 20 mg by mouth daily.    Brittney Li  mirtazapine (REMERON) 30 MG tablet Take 30 mg by mouth at bedtime.    Brittney Li  Multiple Vitamin (MULTIVITAMIN WITH MINERALS) TABS tablet Take 1 tablet by mouth daily.    Brittney Li    oxybutynin (DITROPAN) 5 MG tablet Take 5 mg by mouth daily.    Brittney Li  torsemide (DEMADEX) 20 MG tablet TAKE 2 TABLETS BY MOUTH TWICE DAILY. Patient not taking: Reported on 06/23/2017 03/25/17   Brittney Li  torsemide (DEMADEX) 20 MG tablet TAKE 2 TABLETS BY MOUTH TWICE DAILY. Patient not taking: Reported on 06/23/2017 05/24/17   Brittney Li, Brittney Li  traMADol (ULTRAM) 50 MG tablet Take 50 mg by mouth 3 (three) times daily as needed for pain (Takes one tablet every morning and evening.).    Brittney Li    Physical Exam: Vitals:   06/23/17 1334 06/23/17 1337 06/23/17 1400 06/23/17 1530  BP:  (!) 119/59 113/60 113/61  Pulse: 62 62 (!) 59 64  Resp: 17 18 19 14   Temp:      TempSrc:      SpO2: 98% 100% 100% 100%  Weight:      Height:          Constitutional: NAD, calm, comfortable Vitals:   06/23/17 1334 06/23/17 1337 06/23/17 1400 06/23/17 1530  BP:  (!) 119/59 113/60 113/61  Pulse: 62 62 (!) 59 64  Resp: 17 18 19 14   Temp:      TempSrc:      SpO2: 98% 100% 100% 100%  Weight:      Height:       Eyes: PERRL, lids and conjunctivae normal ENMT: Mucous membranes are moist. Posterior pharynx clear of any exudate or lesions.Normal dentition.  Neck: normal, supple, no masses, no thyromegaly Respiratory: clear to auscultation bilaterally, no wheezing, mild crackles at bases bilaterally. Normal respiratory effort. No accessory muscle use.  Cardiovascular: Regular rate and rhythm, no murmurs / rubs / gallops.  1-2+ extremity edema. 2+ pedal pulses. No carotid bruits.  Abdomen: no tenderness, no masses palpated. No hepatosplenomegaly. Bowel sounds positive.  Musculoskeletal: no clubbing / cyanosis. No joint deformity upper and lower extremities. Good ROM, no contractures. Normal muscle tone.  Skin: no rashes, lesions, ulcers. No induration Neurologic: CN 2-12 grossly intact. Sensation intact, DTR normal. Strength 5/5 in all 4.  Psychiatric: Normal  judgment and insight. Alert and oriented x 3. Normal mood.    Labs on Admission: I have personally reviewed following labs and imaging studies  CBC: Recent Labs  Lab 06/23/17 1259  WBC 5.4  NEUTROABS 4.0  HGB 12.9  HCT 38.6  MCV 86.4  PLT 46*   Basic Metabolic Panel: Recent Labs  Lab 06/23/17 1259  NA 143  K 5.0  CL 98*  CO2 34*  GLUCOSE 97  BUN 61*  CREATININE 1.64*  CALCIUM 9.2   GFR: Estimated Creatinine Clearance: 24.9 mL/min (A) (by C-G formula based on SCr of 1.64 mg/dL (H)). Liver Function Tests: No results for input(s): AST, ALT, ALKPHOS, BILITOT, PROT,  ALBUMIN in the last 168 hours. No results for input(s): LIPASE, AMYLASE in the last 168 hours. No results for input(s): AMMONIA in the last 168 hours. Coagulation Profile: No results for input(s): INR, PROTIME in the last 168 hours. Cardiac Enzymes: Recent Labs  Lab 06/23/17 1259  TROPONINI 0.15*   BNP (last 3 results) No results for input(s): PROBNP in the last 8760 hours. HbA1C: No results for input(s): HGBA1C in the last 72 hours. CBG: No results for input(s): GLUCAP in the last 168 hours. Lipid Profile: No results for input(s): CHOL, HDL, LDLCALC, TRIG, CHOLHDL, LDLDIRECT in the last 72 hours. Thyroid Function Tests: No results for input(s): TSH, T4TOTAL, FREET4, T3FREE, THYROIDAB in the last 72 hours. Anemia Panel: No results for input(s): VITAMINB12, FOLATE, FERRITIN, TIBC, IRON, RETICCTPCT in the last 72 hours. Urine analysis:    Component Value Date/Time   COLORURINE YELLOW 02/28/2013 0656   APPEARANCEUR CLEAR 02/28/2013 0656   LABSPEC 1.008 02/28/2013 0656   PHURINE 5.0 02/28/2013 0656   GLUCOSEU NEGATIVE 02/28/2013 0656   HGBUR MODERATE (A) 02/28/2013 0656   BILIRUBINUR NEGATIVE 02/28/2013 0656   KETONESUR NEGATIVE 02/28/2013 0656   PROTEINUR 30 (A) 02/28/2013 0656   UROBILINOGEN 1.0 02/28/2013 0656   NITRITE NEGATIVE 02/28/2013 0656   LEUKOCYTESUR SMALL (A) 02/28/2013 0656    Sepsis Labs: !!!!!!!!!!!!!!!!!!!!!!!!!!!!!!!!!!!!!!!!!!!! @LABRCNTIP (procalcitonin:4,lacticidven:4) )No results found for this or any previous visit (from the past 240 hour(s)).   Radiological Exams on Admission: Dg Chest 2 View  Result Date: 06/23/2017 CLINICAL DATA:  Dyspnea EXAM: CHEST  2 VIEW COMPARISON:  02/27/2013 chest radiograph. FINDINGS: Stable cardiomediastinal silhouette with mild cardiomegaly and aortic atherosclerosis. No pneumothorax. No pleural effusion. Hyperinflated lungs. No overt pulmonary edema. No acute consolidative airspace disease. IMPRESSION: 1. Hyperinflated lungs, suggesting COPD. No acute pulmonary disease. 2. Stable cardiomegaly without overt pulmonary edema. Electronically Signed   By: Delbert Phenix Li.D.   On: 06/23/2017 12:05    EKG: Independently reviewed.  Normal sinus rhythm with right bundle branch block which is old compared to old EKG from 2017 Case discussed with EDP Old chart reviewed Chest x-ray reviewed without any edema or infiltrate  Assessment/Plan 84 year old female with acute on chronic diastolic congestive heart failure Principal Problem:   Acute on chronic diastolic heart failure (HCC)-patient had not had a recent echo in several years repeat cardiac echo in the morning.  Her troponin is mildly elevated at 0.15.  Placed on aspirin.  She is not having any chest pain.  This is likely due to demand ischemia from hypoxia.  Serial troponin and trend.  Cardiology has been consulted.  Placed on Lasix 80 mg IV every 24 hours.  Placed on daily weights and accurate I's and O's.  Active Problems:   Acute respiratory failure with hypoxia (HCC)-currently at rest her O2 sats are normal on her 3 L.  Continue supplemental oxygen as needed.     Dementia-This must be very mild she is very clear at this time.   Essential hypertension-stable   GERD-stable   CKD (chronic kidney disease) stage 3, GFR 30-59 ml/min (HCC)-patient's baseline creatinine is around  1.4.  It is up to 1.7.  Hold Lasix at this time our increasing her diuretics.   Cardiorenal syndrome with renal failure noted     DVT prophylaxis: SCDs Code Status: Full Family Communication: None Disposition Plan: Per day team Consults called: Cardiology Admission status: Admission   Brittney Li A Li [facility]  If 7PM-7AM, please contact night-coverage [email or web address] Password [email or web address]  06/23/2017, 3:43 PM

## 2017-06-23 NOTE — Consult Note (Signed)
Cardiology Consultation:   Patient ID: Brittney Li; [id number]; April 14, 1930   Admit date: 06/23/2017 Date of Consult: 06/23/2017  Primary Care Provider: Avon Gully, MD Primary Cardiologist: Dr. Wyline Mood and Dr. Shirlee Latch   Patient Profile:   Brittney Li is a 84 y.o. female with a hx of chronic diastolic heart failure who is being seen today for the evaluation of acute on chronic diastolic heart failure at the request of Dr. Terie Purser.  History of Present Illness:   Ms. Brittney Li is an 84 year old woman with a past medical history significant for chronic diastolic heart failure with grade 2 diastolic dysfunction, oxygen dependent COPD, type 2 diabetes mellitus, and hypertension who presented to the ED from her primary care physician's office for increasing shortness of breath and hypoxia.  She normally uses 3 L of oxygen at home but had been using 2 L when she saw her PCP today.  She said her oxygen saturation was 78%.  When it was turned up to 3 L, it increased some but did not normalize.  Her PCP recommended she come to the hospital.  Upon speaking with her, it appears her shortness of breath started getting worse about 2 months ago and she began to develop progressive bilateral lower extremity and dorsal pedal edema.  It is gotten to the point where she was unable to tie her shoes.  She has already received IV Lasix and she is already feeling much better with decreased leg edema and decreased shortness of breath.  She has put out 400 cc thus far.    She denies chest pain altogether.  Echocardiogram which I reviewed performed on 08/26/15 showed normal left ventricular systolic function, LVEF 55-60%, moderate LVH, grade 2 diastolic dysfunction with elevated ventricular filling pressures, moderate to severe left atrial dilatation, moderate right atrial dilatation, mildly reduced right ventricular systolic function, mild tricuspid regurgitation with mildly elevated pulmonary pressures, 40  mmHg.  Grade 2 diastolic dysfunction was also seen by an echocardiogram in October 2014.  Chest x-ray today shows COPD with no acute pulmonary disease and no overt pulmonary edema.  Pertinent labs: Mildly elevated troponin of 0.15, elevated BNP of 749, BUN 61, creatinine 1.64, sodium 143, potassium 5, normal white blood cell count and normal hemoglobin.  She is thrombocytopenic with a platelet count of 46.  ECG performed today which I personally interpreted demonstrated sinus rhythm with right bundle branch block and late R wave transition.    Past Medical History:  Diagnosis Date  . Arthritis   . Asthma   . Bronchitis   . CHF (congestive heart failure) (HCC)    a. ECHO (01/2013) EF 60-65%, grade II diastolic dysfx, LA mildly dilated  . COPD (chronic obstructive pulmonary disease) (HCC)   . Diabetes mellitus without complication (HCC)   . Hypertension     Past Surgical History:  Procedure Laterality Date  . ABDOMINAL HYSTERECTOMY  1975  . hands         Inpatient Medications: Scheduled Meds: . amLODipine  10 mg Oral Daily   And  . benazepril  20 mg Oral Daily  . aspirin  81 mg Oral Daily  . atorvastatin  20 mg Oral q1800  . donepezil  10 mg Oral QHS  . furosemide  80 mg Intravenous Daily  . mirtazapine  30 mg Oral QHS  . mometasone-formoterol  2 puff Inhalation BID  . oxybutynin  5 mg Oral Daily  . potassium chloride  60 mEq Oral BID  . sodium chloride flush  3 mL Intravenous Q12H   Continuous Infusions: . sodium chloride     PRN Meds: sodium chloride, acetaminophen, albuterol, ondansetron (ZOFRAN) IV, sodium chloride flush  Allergies:    Allergies  Allergen Reactions  . Erythromycin Other (See Comments)    Unknown   . Penicillins Other (See Comments)    Unknown     Social History:   Social History   Socioeconomic History  . Marital status: Divorced    Spouse name: Not on file  . Number of children: Not on file  . Years of education: 12th grade  .  Highest education level: Not on file  Social Needs  . Financial resource strain: Not on file  . Food insecurity - worry: Not on file  . Food insecurity - inability: Not on file  . Transportation needs - medical: Not on file  . Transportation needs - non-medical: Not on file  Occupational History  . Occupation: retired    Associate Professormployer: RETIRED  Tobacco Use  . Smoking status: Former Games developermoker  . Smokeless tobacco: Never Used  . Tobacco comment: quit smoking 1990  Substance and Sexual Activity  . Alcohol use: No  . Drug use: No  . Sexual activity: Not on file  Other Topics Concern  . Not on file  Social History Narrative  . Not on file    Family History:   No premature coronary artery disease.  Family History  Problem Relation Age of Onset  . Arthritis Unknown   . Asthma Unknown   . Diabetes Unknown      ROS:  Please see the history of present illness.  ROS  All other ROS reviewed and negative.     Physical Exam/Data:   Vitals:   06/23/17 1400 06/23/17 1530 06/23/17 1647 06/23/17 1654  BP: 113/60 113/61  (!) 111/49  Pulse: (!) 59 64  65  Resp: 19 14  20   Temp:    98.2 F (36.8 C)  TempSrc:    Oral  SpO2: 100% 100%  100%  Weight:   167 lb 15.9 oz (76.2 kg)   Height:   5\' 5"  (1.651 m)     Intake/Output Summary (Last 24 hours) at 06/23/2017 1723 Last data filed at 06/23/2017 1550 Gross per 24 hour  Intake -  Output 400 ml  Net -400 ml   Filed Weights   06/23/17 1122 06/23/17 1647  Weight: 174 lb (78.9 kg) 167 lb 15.9 oz (76.2 kg)   Body mass index is 27.96 kg/m.  General:  Well nourished, well developed, elderly woman in no acute distress HEENT: normal Lymph: no adenopathy Neck: Jugular venous distention to the angle of the jaw. Endocrine:  No thryomegaly Cardiac:  normal S1, S2; RRR; no murmur  Lungs: Diminished sounds throughout.  No obvious wheezes or crackles. Abd: soft, nontender, no hepatomegaly  Ext: 2+ pitting bilateral lower extremity  edema Musculoskeletal:  No deformities, BUE and BLE strength normal and equal Skin: warm and dry  Neuro:  No focal abnormalities noted Psych:  Normal affect    Relevant CV Studies:   Laboratory Data:  Chemistry Recent Labs  Lab 06/23/17 1259  NA 143  K 5.0  CL 98*  CO2 34*  GLUCOSE 97  BUN 61*  CREATININE 1.64*  CALCIUM 9.2  GFRNONAA 27*  GFRAA 32*  ANIONGAP 11    No results for input(s): PROT, ALBUMIN, AST, ALT, ALKPHOS, BILITOT in the last 168 hours. Hematology Recent Labs  Lab 06/23/17 1259  WBC 5.4  RBC 4.47  HGB 12.9  HCT 38.6  MCV 86.4  MCH 28.9  MCHC 33.4  RDW 14.6  PLT 46*   Cardiac Enzymes Recent Labs  Lab 06/23/17 1259  TROPONINI 0.15*   No results for input(s): TROPIPOC in the last 168 hours.  BNP Recent Labs  Lab 06/23/17 1259  BNP 749.0*    DDimer No results for input(s): DDIMER in the last 168 hours.  Radiology/Studies:  Dg Chest 2 View  Result Date: 06/23/2017 CLINICAL DATA:  Dyspnea EXAM: CHEST  2 VIEW COMPARISON:  02/27/2013 chest radiograph. FINDINGS: Stable cardiomediastinal silhouette with mild cardiomegaly and aortic atherosclerosis. No pneumothorax. No pleural effusion. Hyperinflated lungs. No overt pulmonary edema. No acute consolidative airspace disease. IMPRESSION: 1. Hyperinflated lungs, suggesting COPD. No acute pulmonary disease. 2. Stable cardiomegaly without overt pulmonary edema. Electronically Signed   By: Delbert Phenix M.D.   On: 06/23/2017 12:05    Assessment and Plan:   1.  Acute on chronic diastolic heart failure: She is presently on IV Lasix 80 mg twice daily.  She has put out 400 cc and has symptomatically improved.  I would recommend continuing this with close monitoring of renal function given her chronic kidney disease.  Given her comorbidities, she likely has cardiorenal syndrome.  Minimal troponin elevation is likely indicative of demand ischemia.  An echocardiogram has been ordered by internal medicine.  2.   Hypertension: Blood pressure is normal.  She is on amlodipine and benazepril.  3.  Hyperlipidemia: Continue Lipitor 20 mg.  4.  Oxygen dependent COPD: At the present time this appears to be stable.   For questions or updates, please contact [facility] Please consult [email or web address] for contact info under Cardiology/STEMI.   Signed, Prentice Docker, MD  06/23/2017 5:23 PM

## 2017-06-23 NOTE — ED Notes (Signed)
Pt provided lunch tray.

## 2017-06-23 NOTE — ED Provider Notes (Signed)
[facility] Provider Note   CSN: [id number] Arrival date & time: 06/23/17  1102     History   Chief Complaint Chief Complaint  Patient presents with  . Shortness of Breath    HPI Brittney Li is a 84 y.o. female.  HPI   84 year old female with dyspnea.  Chronic, progressively worsening over the past several months.  She is now at the point that she cannot ambulate across the room without having to stop to catch her breath.  She reports increasing lower extremity edema.  She is compliant with her medications.  She is on 3 L of oxygen at baseline.  She denies any acute pain.  Past Medical History:  Diagnosis Date  . Arthritis   . Asthma   . Bronchitis   . CHF (congestive heart failure) (HCC)    a. ECHO (01/2013) EF 60-65%, grade II diastolic dysfx, LA mildly dilated  . COPD (chronic obstructive pulmonary disease) (HCC)   . Diabetes mellitus without complication (HCC)   . Hypertension     Patient Active Problem List   Diagnosis Date Noted  . Cardiorenal syndrome with renal failure 03/02/2013  . Acute renal failure (ARF) (HCC) 02/28/2013  . Acute hyperkalemia 02/28/2013  . Acute respiratory failure with hypoxia (HCC) 02/28/2013  . Acute diastolic heart failure (HCC) 02/28/2013  . CKD (chronic kidney disease) stage 3, GFR 30-59 ml/min (HCC) 02/28/2013  . Hypoglycemia 02/28/2013  . Acute on chronic diastolic heart failure (HCC) 02/02/2013  . Impingement syndrome of left shoulder 09/21/2012  . Rotator cuff tear arthropathy 09/21/2012  . Arthritis of shoulder region, degenerative 09/21/2012  . Arthritis of hand, degenerative 02/17/2011  . Arthritis of wrist, left, degenerative 01/06/2011  . WRIST PAIN, LEFT 04/15/2010  . SHOULDER PAIN 11/08/2008  . IMPINGEMENT SYNDROME 11/08/2008  . TRIGGER FINGER, RIGHT THUMB 12/28/2006  . DEPRESSION 11/29/2006  . DEMENTIA, MILD 11/05/2006  . CONSTIPATION, DRUG INDUCED 10/08/2006  . VERTIGO 09/10/2006  .  HYPERLIPIDEMIA 04/27/2006  . SYNDROME, CARPAL TUNNEL 04/27/2006  . HYPERTENSION 04/27/2006  . ALLERGIC RHINITIS 04/27/2006  . BRONCHITIS, CHRONIC NOS 04/27/2006  . ASTHMA 04/27/2006  . GERD 04/27/2006  . FIBROCYSTIC BREAST DISEASE 04/27/2006  . URINARY INCONTINENCE 04/27/2006  . CEREBROVASCULAR ACCIDENT, HX OF 04/27/2006    Past Surgical History:  Procedure Laterality Date  . ABDOMINAL HYSTERECTOMY  1975  . hands      OB History    No data available       Home Medications    Prior to Admission medications   Medication Sig Start Date End Date Taking? Authorizing Provider  albuterol (PROVENTIL HFA;VENTOLIN HFA) 108 (90 BASE) MCG/ACT inhaler Inhale 2 puffs into the lungs every 6 (six) hours as needed for wheezing.    [provider]  albuterol (PROVENTIL) (2.5 MG/3ML) 0.083% nebulizer solution Take 2.5 mg by nebulization every 6 (six) hours as needed for wheezing.    [provider]  amLODipine-benazepril (LOTREL) 10-20 MG per capsule Take 1 capsule by mouth daily.    [provider]  aspirin 81 MG tablet Take 81 mg by mouth daily.      [provider]  atorvastatin (LIPITOR) 20 MG tablet Take 20 mg by mouth daily at 6 PM.    [provider]  docusate sodium (COLACE) 100 MG capsule Take 200 mg by mouth at bedtime.    [provider]  donepezil (ARICEPT) 10 MG tablet Take 10 mg by mouth at bedtime.    [provider]  famotidine (PEPCID) 20 MG tablet Take 20 mg by mouth daily.    [provider]  mirtazapine (REMERON) 30 MG tablet Take 30 mg by mouth at bedtime.    [provider]  mometasone-formoterol (DULERA) 200-5 MCG/ACT AERO Inhale 2 puffs into the lungs 2 (two) times daily. 02/06/13   Avon Gully, MD  Multiple Vitamin (MULTIVITAMIN WITH MINERALS) TABS tablet Take 1 tablet by mouth daily.    [provider]  oxybutynin (DITROPAN) 5 MG tablet Take 5 mg by mouth daily.    [provider]  potassium chloride (KLOR-CON) 20 MEQ packet Take 60 mEq by mouth every other day.    [provider]  torsemide (DEMADEX) 20 MG tablet TAKE 2 TABLETS BY MOUTH TWICE DAILY. 03/25/17   Jodelle Gross, NP  torsemide (DEMADEX) 20 MG tablet TAKE 2 TABLETS BY MOUTH TWICE DAILY. 04/29/17   Jodelle Gross, NP  torsemide (DEMADEX) 20 MG tablet TAKE 2 TABLETS BY MOUTH TWICE DAILY. 05/24/17   Pricilla Riffle, MD  traMADol (ULTRAM) 50 MG tablet Take 50 mg by mouth 3 (three) times daily as needed for pain (Takes one tablet every morning and evening.).    [provider]    Family History Family History  Problem Relation Age of Onset  . Arthritis Unknown   . Asthma Unknown   . Diabetes Unknown     Social History Social History   Tobacco Use  . Smoking status: Former Games developer  . Smokeless tobacco: Never Used  . Tobacco comment: quit smoking 1990  Substance Use Topics  . Alcohol use: No  . Drug use: No     Allergies   Erythromycin and Penicillins   Review of Systems Review of Systems  All systems reviewed and negative, other than as noted in HPI.  Physical Exam Updated Vital Signs BP (!) 103/49 (BP Location: Right Arm)   Pulse 70   Temp 98.4 F (36.9 C) (Oral)   Resp (!) 22   Ht 5\' 3"  (1.6 m)   Wt 78.9 kg (174 lb)   SpO2 (!) 74%   BMI 30.82 kg/m   Physical Exam  Constitutional: She appears well-developed and well-nourished. No distress.  HENT:  Head: Normocephalic and atraumatic.  Eyes: Conjunctivae are normal. Right eye exhibits no discharge. Left eye exhibits no discharge.  Neck: Neck supple.  Cardiovascular: Normal rate, regular rhythm and normal heart sounds. Exam reveals no gallop and no friction rub.  No murmur heard. Pulmonary/Chest: No respiratory distress.  Mild tachypnea.  She was short of breath just transferring from the wheelchair onto the stretcher.  Seems relatively comfortable once situated on bed.  Crackles  bilaterally.  No wheezing appreciated.  Abdominal: Soft. She exhibits no distension. There is no tenderness.  Musculoskeletal: She exhibits edema. She exhibits no tenderness.  Severe pitting lower extremity edema.  Her shoes are barely on her despite being unlaced.  Her stockings are stretched to the point a ripping.  Neurological: She is alert.  Skin: Skin is warm and dry.  Psychiatric: She has a normal mood and affect. Her behavior is normal. Thought content normal.  Nursing note and vitals reviewed.    ED Treatments / Results  Labs (all labs ordered are listed, but only abnormal results are displayed) Labs Reviewed - No data to display  EKG  EKG Interpretation None       Radiology Dg Chest 2 View  Result Date: 06/23/2017 CLINICAL DATA:  Dyspnea EXAM:  CHEST  2 VIEW COMPARISON:  02/27/2013 chest radiograph. FINDINGS: Stable cardiomediastinal silhouette with mild cardiomegaly and aortic atherosclerosis. No pneumothorax. No pleural effusion. Hyperinflated lungs. No overt pulmonary edema. No acute consolidative airspace disease. IMPRESSION: 1. Hyperinflated lungs, suggesting COPD. No acute pulmonary disease. 2. Stable cardiomegaly without overt pulmonary edema. Electronically Signed   By: Delbert Phenix M.D.   On: 06/23/2017 12:05    Procedures Procedures (including critical care time)  Medications Ordered in ED Medications - No data to display   Initial Impression / Assessment and Plan / ED Course  I have reviewed the triage vital signs and the nursing notes.  Pertinent labs & imaging results that were available during my care of the patient were reviewed by me and considered in my medical decision making (see chart for details).     84 year old female dyspnea.  Clinically suspect heart failure.  Severe peripheral edema.  Crackles on exam.  Chest x-ray is not overly impressive though.  BNP is elevated.  Troponin is elevated as well although she denies any pain.  EKG does not  appear acutely changed.  She was given a dose of IV Lasix.  Will admit for ongoing diuresis.  Final Clinical Impressions(s) / ED Diagnoses   Final diagnoses:  SOB (shortness of breath)  Acute on chronic congestive heart failure, unspecified heart failure type (HCC)  Thrombocytopenia [facility])    ED Discharge Orders    None       Raeford Razor, MD 06/23/17 2138

## 2017-06-23 NOTE — ED Triage Notes (Signed)
[facility].  Patient complains of shortness of breath for several weeks. Also has bilateral leg swelling. Denies pain. NAD noted at this time.   Oxygen 74% on 3 LPM.

## 2017-06-23 NOTE — ED Notes (Signed)
Pt placed on 3 liters [place]. Pt now 96%. nad noted.

## 2017-06-23 NOTE — ED Notes (Signed)
Dr Juleen ChinaKohut aware of tropnin

## 2017-06-24 ENCOUNTER — Inpatient Hospital Stay (HOSPITAL_COMMUNITY): Payer: Medicare Other

## 2017-06-24 DIAGNOSIS — J9621 Acute and chronic respiratory failure with hypoxia: Secondary | ICD-10-CM

## 2017-06-24 DIAGNOSIS — D696 Thrombocytopenia, unspecified: Secondary | ICD-10-CM | POA: Diagnosis present

## 2017-06-24 DIAGNOSIS — I340 Nonrheumatic mitral (valve) insufficiency: Secondary | ICD-10-CM

## 2017-06-24 LAB — TROPONIN I: TROPONIN I: 0.2 ng/mL — AB (ref <0.03)

## 2017-06-24 LAB — BASIC METABOLIC PANEL
Anion gap: 9 (ref 5–15)
BUN: 55 mg/dL — AB (ref 6–20)
CHLORIDE: 100 mmol/L — AB (ref 101–111)
CO2: 34 mmol/L — AB (ref 22–32)
Calcium: 8.7 mg/dL — ABNORMAL LOW (ref 8.9–10.3)
Creatinine, Ser: 1.58 mg/dL — ABNORMAL HIGH (ref 0.44–1.00)
GFR calc Af Amer: 33 mL/min — ABNORMAL LOW (ref >60)
GFR calc non Af Amer: 29 mL/min — ABNORMAL LOW (ref >60)
Glucose, Bld: 104 mg/dL — ABNORMAL HIGH (ref 65–99)
POTASSIUM: 4.9 mmol/L (ref 3.5–5.1)
SODIUM: 143 mmol/L (ref 135–145)

## 2017-06-24 LAB — ECHOCARDIOGRAM COMPLETE
Height: 65 in
WEIGHTICAEL: 2913.6 [oz_av]

## 2017-06-24 MED ORDER — FUROSEMIDE 10 MG/ML IJ SOLN
80.0000 mg | Freq: Two times a day (BID) | INTRAMUSCULAR | Status: DC
  Administered 2017-06-24 – 2017-06-28 (×9): 80 mg via INTRAVENOUS
  Filled 2017-06-24 (×8): qty 8

## 2017-06-24 NOTE — Progress Notes (Signed)
PROGRESS NOTE    Brittney Li  ZOX:[id number] DOB: 03/17/1930 DOA: 06/23/2017 PCP: Avon GullyFanta, Tesfaye, MD    Brief Narrative:  84 year old female with a history of oxygen dependent COPD on 3 L, presented with decompensated diastolic congestive heart failure.  Started on intravenous Lasix.  Cardiology following.  Patient is improving.  Anticipate discharge in the next 24 hours she continues to improve.     Assessment & Plan:   Principal Problem:   Acute on chronic diastolic heart failure (HCC) Active Problems:   HLD (hyperlipidemia)   Dementia   Essential hypertension   GERD   Acute on chronic respiratory failure with hypoxia (HCC)   CKD (chronic kidney disease) stage 3, GFR 30-59 ml/min (HCC)   Cardiorenal syndrome with renal failure   CHF exacerbation (HCC)   Thrombocytopenia (HCC)   1. Acute on chronic diastolic congestive heart failure.  Started on intravenous Lasix.  Good urine output.  Volume status is improving.  Appears to be approaching baseline.  Anticipate transitioning to oral diuretics in a.m.  Cardiology following.  Echocardiogram shows normal ejection fraction with grade 2 diastolic dysfunction. 2. Acute on chronic respiratory failure.  Related to CHF exacerbation.  She is chronically on 3 L.  Respiratory status appears to be approaching baseline. 3. Chronic kidney disease stage III.  Creatinine is near baseline and is stable.  Continue to monitor in the setting of diuresis 4. COPD.  No wheezing at this time.  Continue on Dulera. 5. Elevated troponin.  Suspect this is related to decompensated CHF in the setting of chronic kidney disease.  No significant rise in troponin to indicate ACS.  No further workup planned at this time. 6. Hyperlipidemia.  Continue statin 7. Hypertension.  Blood pressures running low today.  Hold amlodipine and benazepril for today. 8. Dementia.  Mild.  Continue on Aricept. 9. Thrombocytopenia.  No prior history of the same.  Unclear if this is a  true value or lab error.  We will repeat labs.  Check B12.  She does not appear septic or toxic.   DVT prophylaxis: SCDs Code Status: Full code Family Communication: No family present Disposition Plan: Discharge home once improved   Consultants:   Cardiology  Procedures:  Echo:- Left ventricle: The cavity size was normal. Wall thickness was   increased in a pattern of severe LVH. Systolic function was   normal. The estimated ejection fraction was in the range of 55%   to 60%. Wall motion was normal; there were no regional wall   motion abnormalities. Features are consistent with a pseudonormal   left ventricular filling pattern, with concomitant abnormal   relaxation and increased filling pressure (grade 2 diastolic   dysfunction). Doppler parameters are consistent with high   ventricular filling pressure. - Aortic valve: Mildly calcified annulus. Trileaflet; mildly   thickened leaflets. Valve area (VTI): 1.76 cm^2. Valve area   (Vmax): 2.13 cm^2. - Mitral valve: There was mild regurgitation. - Left atrium: The atrium was severely dilated. - Right ventricle: The cavity size was moderately dilated. TAPSE:   16.6 mm . Lateral annulus peak S velocity: 9.68 cm/s. - Right atrium: The atrium was severely dilated. - Atrial septum: No defect or patent foramen ovale was identified. - Pulmonary arteries: Systolic pressure was moderately increased.   PA peak pressure: 53 mm Hg (S).  Antimicrobials:       Subjective: Feeling better today.  Feels that breathing is improving and may be approaching baseline.  Feels that lower extremity  edema is better.  No chest pain.  Objective: Vitals:   06/24/17 0848 06/24/17 1041 06/24/17 1046 06/24/17 1312  BP:  (!) 96/48 (!) 102/45 (!) 95/51  Pulse:  (!) 56  61  Resp:    18  Temp:    98.5 F (36.9 C)  TempSrc:    Oral  SpO2: 94%   96%  Weight:      Height:        Intake/Output Summary (Last 24 hours) at 06/24/2017 1824 Last data filed  at 06/24/2017 1300 Gross per 24 hour  Intake 480 ml  Output 700 ml  Net -220 ml   Filed Weights   06/23/17 1122 06/23/17 1647 06/24/17 0601  Weight: 78.9 kg (174 lb) 76.2 kg (167 lb 15.9 oz) 82.6 kg (182 lb 1.6 oz)    Examination:  General exam: Appears calm and comfortable  Respiratory system: Clear to auscultation. Respiratory effort normal. Cardiovascular system: S1 & S2 heard, RRR. No JVD, murmurs, rubs, gallops or clicks. No pedal edema. Gastrointestinal system: Abdomen is nondistended, soft and nontender. No organomegaly or masses felt. Normal bowel sounds heard. Central nervous system: Alert and oriented. No focal neurological deficits. Extremities: Symmetric 5 x 5 power. Skin: No rashes, lesions or ulcers Psychiatry: Judgement and insight appear normal. Mood & affect appropriate.     Data Reviewed: I have personally reviewed following labs and imaging studies  CBC: Recent Labs  Lab 06/23/17 1259  WBC 5.4  NEUTROABS 4.0  HGB 12.9  HCT 38.6  MCV 86.4  PLT 46*   Basic Metabolic Panel: Recent Labs  Lab 06/23/17 1259 06/24/17 0320  NA 143 143  K 5.0 4.9  CL 98* 100*  CO2 34* 34*  GLUCOSE 97 104*  BUN 61* 55*  CREATININE 1.64* 1.58*  CALCIUM 9.2 8.7*   GFR: Estimated Creatinine Clearance: 27.6 mL/min (A) (by C-G formula based on SCr of 1.58 mg/dL (H)). Liver Function Tests: No results for input(s): AST, ALT, ALKPHOS, BILITOT, PROT, ALBUMIN in the last 168 hours. No results for input(s): LIPASE, AMYLASE in the last 168 hours. No results for input(s): AMMONIA in the last 168 hours. Coagulation Profile: No results for input(s): INR, PROTIME in the last 168 hours. Cardiac Enzymes: Recent Labs  Lab 06/23/17 1259 06/23/17 1638 06/23/17 2143 06/24/17 0320  TROPONINI 0.15* 0.17* 0.21* 0.20*   BNP (last 3 results) No results for input(s): PROBNP in the last 8760 hours. HbA1C: No results for input(s): HGBA1C in the last 72 hours. CBG: No results for  input(s): GLUCAP in the last 168 hours. Lipid Profile: No results for input(s): CHOL, HDL, LDLCALC, TRIG, CHOLHDL, LDLDIRECT in the last 72 hours. Thyroid Function Tests: No results for input(s): TSH, T4TOTAL, FREET4, T3FREE, THYROIDAB in the last 72 hours. Anemia Panel: No results for input(s): VITAMINB12, FOLATE, FERRITIN, TIBC, IRON, RETICCTPCT in the last 72 hours. Sepsis Labs: No results for input(s): PROCALCITON, LATICACIDVEN in the last 168 hours.  No results found for this or any previous visit (from the past 240 hour(s)).       Radiology Studies: Dg Chest 2 View  Result Date: 06/23/2017 CLINICAL DATA:  Dyspnea EXAM: CHEST  2 VIEW COMPARISON:  02/27/2013 chest radiograph. FINDINGS: Stable cardiomediastinal silhouette with mild cardiomegaly and aortic atherosclerosis. No pneumothorax. No pleural effusion. Hyperinflated lungs. No overt pulmonary edema. No acute consolidative airspace disease. IMPRESSION: 1. Hyperinflated lungs, suggesting COPD. No acute pulmonary disease. 2. Stable cardiomegaly without overt pulmonary edema. Electronically Signed   By: Tomasa Hose  Poff M.D.   On: 06/23/2017 12:05        Scheduled Meds: . amLODipine  10 mg Oral Daily   And  . benazepril  20 mg Oral Daily  . aspirin  81 mg Oral Daily  . atorvastatin  20 mg Oral q1800  . donepezil  10 mg Oral QHS  . furosemide  80 mg Intravenous BID  . mirtazapine  30 mg Oral QHS  . mometasone-formoterol  2 puff Inhalation BID  . oxybutynin  5 mg Oral Daily  . potassium chloride  60 mEq Oral BID  . sodium chloride flush  3 mL Intravenous Q12H   Continuous Infusions: . sodium chloride       LOS: 1 day    Time spent:    Erick Blinks, MD [facility] Pager [phone]  If 7PM-7AM, please contact night-coverage [email or web address] Password [facility] 06/24/2017, 6:24 PM

## 2017-06-24 NOTE — Evaluation (Signed)
Physical Therapy Evaluation Patient Details Name: Brittney Li MRN: [id number] DOB: 06/28/1929 Today's Date: 06/24/2017   History of Present Illness  Brittney HawsMary A Li is a 84 y.o. female with medical history significant of diastolic congestive heart failure with normal EF back in 2017 per last echo, COPD, diabetes, hypertension, dementia noted in her chart, chronic respiratory failure on 3 L of oxygen chronically at home comes in from her primary care physician's office Dr. Felecia ShellingFanta for hypoxia.  Patient reports she has had several days of worsening shortness of breath that is worse than her normal.  She is also had some increased lower extremity swelling.  She denies any chest pain.  She denies any nausea vomiting or diarrhea.  She denies any orthopnea or PND.  She was in Dr. Tollie PizzaWitt's office and was noted to be hypoxic in the 70s despite her 3 L so was sent to the emergency department  Clinical Impression  Brittney Li is an 84yo female who is oxygen dependent.  She lives in senior apartments and has an aide Monday thru Saturday.  She ambulates in her home only.  She has all the assistive devices she needs.  She has recently became weaker and will benefit from home health physical therapy to increase her strength and balance to decrease her risk of falling.     Follow Up Recommendations Home health PT    Equipment Recommendations  None recommended by PT    Recommendations for Other Services   none    Precautions / Restrictions Precautions Precautions: None Restrictions Weight Bearing Restrictions: No      Mobility  Bed Mobility Overal bed mobility: Modified Independent                Transfers Overall transfer level: Modified independent Equipment used: Rolling walker (2 wheeled)                Ambulation/Gait Ambulation/Gait assistance: Supervision Ambulation Distance (Feet): 16 Feet Assistive device: Rolling walker (2 wheeled) Gait Pattern/deviations: Decreased step  length - right;Decreased step length - left;Trunk flexed   Gait velocity interpretation: <1.8 ft/sec, indicative of risk for recurrent falls             Pertinent Vitals/Pain Pain Assessment: No/denies pain    Home Living Family/patient expects to be discharged to:: Private residence Living Arrangements: Alone Available Help at Discharge: Personal care attendant Type of Home: Apartment Home Access: Ramped entrance     Home Layout: One level Home Equipment: Walker - standard;Bedside commode;Tub bench;Wheelchair - manual      Prior Function Level of Independence: Needs assistance   Gait / Transfers Assistance Needed: walk in apartment only   ADL's / Homemaking Assistance Needed: aide assists but does most by herself           Extremity/Trunk Assessment        Lower Extremity Assessment Lower Extremity Assessment: Overall WFL for tasks assessed       Communication   Communication: No difficulties  Cognition Arousal/Alertness: Awake/alert Behavior During Therapy: WFL for tasks assessed/performed Overall Cognitive Status: Within Functional Limits for tasks assessed                                               Assessment/Plan    PT Assessment Patient needs continued PT services  PT Problem List Decreased strength;Decreased mobility;Decreased activity tolerance  PT Treatment Interventions Gait training;Therapeutic activities;Therapeutic exercise;Balance training    PT Goals (Current goals can be found in the Care Plan section)  Acute Rehab PT Goals Patient Stated Goal: To be able to walk easily in her apartment.  PT Goal Formulation: With patient Time For Goal Achievement: 06/28/17 Potential to Achieve Goals: Good    Frequency Min 3X/week              End of Session Equipment Utilized During Treatment: Gait belt Activity Tolerance: Patient tolerated treatment well Patient left: in bed;with call bell/phone within  reach;with bed alarm set Nurse Communication: Mobility status PT Visit Diagnosis: Muscle weakness (generalized) (M62.81);Difficulty in walking, not elsewhere classified (R26.2)    Time: 1610-9604 PT Time Calculation (min) (ACUTE ONLY): 27 min   Charges:   PT Evaluation $PT Eval Low Complexity: 1 Low           Virgina Organ, PT CLT [phone] 06/24/2017, 2:24 PM

## 2017-06-24 NOTE — Plan of Care (Signed)
Nutrition Education Note  RD consulted for nutrition education regarding CHF.  Patient is noted to have some mild cognitive impairment. Because of this, RD focused on the more basic diet recommendations and advocated for diet adjustments that would be easier for her to perform.   Patient notes she has an aide for a few hours each day that arrives around noon. However, she notes the aide has not been helping as much with meals and often she is still frequently faced with trying to find something to eat for dinner. She says she is going to find a new Forensic scientistaide   Went through dietary Recall: Breakfast: Skips (due to no appetite and not having assistance) Lunch: What the aide makes.  Dinner: often relies on Building services engineerconvenience items. She eats canned soup ~4x/week. She eats canned tuna. She will occassionally have hotdogs or deli meats.  Beverages: She says she "loves" buttermilk. She will get a 1/2 gallon once a week and drink it all within 1-2 days.  Behaviors: She does not salt her food; she does not own a salt shaker and hasnt for 6 years. Denies eating w/ any frequency: bacon/sausage, canned meats, dressings/condiments or frozen meals  First, commended her on not using the salt shaker. This is paramount in management of CHF.   She essentially relies on her aide for her meals and, from what she tells me, the aide has been doing a poor job. This relegates her to seeking high-sodium convenience items. She says she "intends to" find a new aide. RD agreed and stated that her lack of assistance is drastically worsening her diet and she needs better help.   Her canned soup is one sodium laden item she frequently consumes, again due to lack of assistance and need to make herself food. She says she knows there are high in sodium. RD gave achievable goal of only consuming 2 cans/week instead of 4. She thought she could do this.    She notes she eats canned tuna often, but cannot say whether or not this is the lower  sodium variety. RD asked her to verify the package says "low sodium" or "heart healthy" as the regular versions are quite high in salt. She says she will.   By chance, it was discovered she is drinking a large amount of buttermilk. She says she drinks this actually quite often. She is bought a half gallon by others and consumes it quite quickly, within a day or two. RD told her that, depending on the brand, buttermilk is often extremely high in salt. She was unaware of this. She offered to switch to skim milk, but says she doesn't like skim milk. RD asked her to limit her intake to only 2 glasses/week. Patient thought it would be easier if her friend only bought her a "pint" of buttermilk instead of the quart or half gallon. RD felt this was a great suggestion.   Patient does have some milk cognitive impairment, reports a poor appetite in general. Therefore, RD felt that just a few simple recommendations were appropriate (listed below)  She has a lack of help at home and it sounds that her diet will improve by association once she is able to procure better help.   Summary of Recommendations 1. Decrease condensed soup intake from 4x a week to 2x 2. Reduce buttermilk intake. Drink only 2 glasses/wk. Or buy the pints instead of quart/half gallon 3. Ensure that the tuna she is eating is the heart healthy/low sodium variety-the regular versions  are quite high in salt.    RD discussed why it is important for patient to adhere to diet recommendations, and emphasized the role of fluids. Answered patients questions  Expect Fair compliance-pt herself is quite willing to eat what is best for her health, but is quite limited by her poor quality of assistance at home and some cognitive impairments.   No further nutrition interventions warranted at this time. If additional nutrition issues arise, please re-consult RD.   Christophe Louis RD, LDN, CNSC Clinical Nutrition Pager: [phone] 06/24/2017 3:45 PM

## 2017-06-24 NOTE — Progress Notes (Signed)
Progress Note  Patient Name: Brittney HawsMary A Li Date of Encounter: 06/24/2017  Primary Cardiologist: Tenny Crawoss  Subjective   SOB improving but not back to baseline.   Inpatient Medications    Scheduled Meds: . amLODipine  10 mg Oral Daily   And  . benazepril  20 mg Oral Daily  . aspirin  81 mg Oral Daily  . atorvastatin  20 mg Oral q1800  . donepezil  10 mg Oral QHS  . furosemide  80 mg Intravenous Daily  . mirtazapine  30 mg Oral QHS  . mometasone-formoterol  2 puff Inhalation BID  . oxybutynin  5 mg Oral Daily  . potassium chloride  60 mEq Oral BID  . sodium chloride flush  3 mL Intravenous Q12H   Continuous Infusions: . sodium chloride     PRN Meds: sodium chloride, acetaminophen, albuterol, ondansetron (ZOFRAN) IV, sodium chloride flush   Vital Signs    Vitals:   06/23/17 2010 06/23/17 2257 06/24/17 0601 06/24/17 0848  BP:  104/74 (!) 98/45   Pulse:  (!) 56 (!) 56   Resp:  20 18   Temp:  98.7 F (37.1 C) 98.4 F (36.9 C)   TempSrc:  Oral Oral   SpO2: 96% 98% 99% 94%  Weight:   182 lb 1.6 oz (82.6 kg)   Height:        Intake/Output Summary (Last 24 hours) at 06/24/2017 0909 Last data filed at 06/23/2017 2100 Gross per 24 hour  Intake 240 ml  Output 1100 ml  Net -860 ml   Filed Weights   06/23/17 1122 06/23/17 1647 06/24/17 0601  Weight: 174 lb (78.9 kg) 167 lb 15.9 oz (76.2 kg) 182 lb 1.6 oz (82.6 kg)    Telemetry    SR  ECG    n/a  Physical Exam   GEN: No acute distress.   Neck: No JVD Cardiac: RRR, no murmurs, rubs, or gallops.  Respiratory: mild bilateral crackles.  GI: Soft, nontender, non-distended  MS: 1+bilateral edema; No deformity. Neuro:  Nonfocal  Psych: Normal affect   Labs    Chemistry Recent Labs  Lab 06/23/17 1259 06/24/17 0320  NA 143 143  K 5.0 4.9  CL 98* 100*  CO2 34* 34*  GLUCOSE 97 104*  BUN 61* 55*  CREATININE 1.64* 1.58*  CALCIUM 9.2 8.7*  GFRNONAA 27* 29*  GFRAA 32* 33*  ANIONGAP 11 9      Hematology Recent Labs  Lab 06/23/17 1259  WBC 5.4  RBC 4.47  HGB 12.9  HCT 38.6  MCV 86.4  MCH 28.9  MCHC 33.4  RDW 14.6  PLT 46*    Cardiac Enzymes Recent Labs  Lab 06/23/17 1259 06/23/17 1638 06/23/17 2143 06/24/17 0320  TROPONINI 0.15* 0.17* 0.21* 0.20*   No results for input(s): TROPIPOC in the last 168 hours.   BNP Recent Labs  Lab 06/23/17 1259  BNP 749.0*     DDimer No results for input(s): DDIMER in the last 168 hours.   Radiology    Dg Chest 2 View  Result Date: 06/23/2017 CLINICAL DATA:  Dyspnea EXAM: CHEST  2 VIEW COMPARISON:  02/27/2013 chest radiograph. FINDINGS: Stable cardiomediastinal silhouette with mild cardiomegaly and aortic atherosclerosis. No pneumothorax. No pleural effusion. Hyperinflated lungs. No overt pulmonary edema. No acute consolidative airspace disease. IMPRESSION: 1. Hyperinflated lungs, suggesting COPD. No acute pulmonary disease. 2. Stable cardiomegaly without overt pulmonary edema. Electronically Signed   By: Delbert PhenixJason A Poff M.D.   On: 06/23/2017 12:05  Cardiac Studies     Patient Profile     Brittney Li is a 84 y.o. female with a hx of chronic diastolic heart failure who is being seen today for the evaluation of acute on chronic diastolic heart failure at the request of Dr. Terie Purser.    Assessment & Plan    1. Acute on chronic diastolic HF - 07/2015 echo LVEF 55-60%, no WMAs, grade II diastolic dysfunction.  - negative since admission, she received lasix 80mg  IV x 1 yesterday. Downtrend in Cr and BUN consistent with venous congestion and CHF. Will change lasix to 80mg  IV bid.   - I think she is nearing euvolemia, would anticipate IV diuretics today and likely back to po tomorrow.   2. COPD - per primary team.   3. Elevated troponin - mild elevation relatively flat in the setting of diastolic HF and CKD and significant hypoxia on admission. . EKG without acute ischemic changes - no plans for ischemic  testing at this time.  - denies any chest pain.    For questions or updates, please contact [facility] Please consult [email or web address] for contact info under Cardiology/STEMI.      Joanie Coddington, MD  06/24/2017, 9:09 AM

## 2017-06-24 NOTE — Progress Notes (Signed)
CRITICAL VALUE ALERT  Critical Value: troponin .20  Date & Time Notied: 06-24-17  Provider Notified: dr. Antionette Charopyd  Orders Received/Actions taken: no new orders received at this time  Dr. Antionette Charopyd notified of patient increased troponin. Patient resting in bed with no complaints.  Will continue to monitor patient.

## 2017-06-24 NOTE — Progress Notes (Signed)
*  PRELIMINARY RESULTS* Echocardiogram 2D Echocardiogram has been performed.  Jeryl Columbialliott, Edan Serratore 06/24/2017, 10:53 AM

## 2017-06-24 NOTE — Care Management Note (Signed)
Case Management Note  Patient Details  Name: Brittney HawsMary A Li MRN: [id number] Date of Birth: 09/20/1929  Subjective/Objective:      Admitted with CHF. Pt is from home, mostly ind with ALD';s. She has aid that come 10a-5p 6 days a week. She says she has no family but have neighbors who look out for her. She has a RW she uses with ambulation. MD [facility] appointment. She has home oxygen 3lpm pta. [facility]. This is her only admission in 6 months. She reports compliance with diet and medications.               Action/Plan: [facility]. She has no preference of DME provider. Olegario MessierKathy, [facility], has been notified of referral. Dietician consult for diet assessment and eduction. Anticipate pt will needs HH. Will refer for Emmi calls at [place].   Expected Discharge Date:     06/26/2016             Expected Discharge Plan:  Home w Home Health Services  Discharge planning Services  CM Consult  Post Acute Care Choice:  Home Health Choice offered to:  Patient  Status of Service:  In process, will continue to follow  [name], Verla Bryngelson Demske, RN 06/24/2017, 12:32 PM

## 2017-06-25 LAB — BASIC METABOLIC PANEL
Anion gap: 9 (ref 5–15)
BUN: 52 mg/dL — ABNORMAL HIGH (ref 6–20)
CHLORIDE: 100 mmol/L — AB (ref 101–111)
CO2: 34 mmol/L — AB (ref 22–32)
Calcium: 8.9 mg/dL (ref 8.9–10.3)
Creatinine, Ser: 1.43 mg/dL — ABNORMAL HIGH (ref 0.44–1.00)
GFR calc non Af Amer: 32 mL/min — ABNORMAL LOW (ref >60)
GFR, EST AFRICAN AMERICAN: 38 mL/min — AB (ref >60)
Glucose, Bld: 84 mg/dL (ref 65–99)
POTASSIUM: 5.1 mmol/L (ref 3.5–5.1)
SODIUM: 143 mmol/L (ref 135–145)

## 2017-06-25 LAB — CBC WITH DIFFERENTIAL/PLATELET
BASOS ABS: 0 10*3/uL (ref 0.0–0.1)
BASOS PCT: 1 %
EOS ABS: 0.2 10*3/uL (ref 0.0–0.7)
Eosinophils Relative: 3 %
HCT: 36.1 % (ref 36.0–46.0)
Hemoglobin: 11.9 g/dL — ABNORMAL LOW (ref 12.0–15.0)
LYMPHS ABS: 1.6 10*3/uL (ref 0.7–4.0)
Lymphocytes Relative: 33 %
MCH: 28.7 pg (ref 26.0–34.0)
MCHC: 33 g/dL (ref 30.0–36.0)
MCV: 87 fL (ref 78.0–100.0)
MONOS PCT: 13 %
Monocytes Absolute: 0.6 10*3/uL (ref 0.1–1.0)
NEUTROS ABS: 2.5 10*3/uL (ref 1.7–7.7)
NEUTROS PCT: 50 %
PLATELETS: 107 10*3/uL — AB (ref 150–400)
RBC: 4.15 MIL/uL (ref 3.87–5.11)
RDW: 14.5 % (ref 11.5–15.5)
WBC: 4.9 10*3/uL (ref 4.0–10.5)

## 2017-06-25 LAB — VITAMIN B12: Vitamin B-12: 565 pg/mL (ref 180–914)

## 2017-06-25 MED ORDER — MECLIZINE HCL 12.5 MG PO TABS
25.0000 mg | ORAL_TABLET | Freq: Once | ORAL | Status: AC
  Administered 2017-06-25: 25 mg via ORAL
  Filled 2017-06-25: qty 2

## 2017-06-25 MED ORDER — AMLODIPINE BESYLATE 5 MG PO TABS
5.0000 mg | ORAL_TABLET | Freq: Every day | ORAL | Status: DC
  Administered 2017-06-27: 5 mg via ORAL
  Filled 2017-06-25 (×4): qty 1

## 2017-06-25 MED ORDER — BENAZEPRIL HCL 10 MG PO TABS
10.0000 mg | ORAL_TABLET | Freq: Every day | ORAL | Status: DC
  Administered 2017-06-27: 10 mg via ORAL
  Filled 2017-06-25 (×4): qty 1

## 2017-06-25 NOTE — Progress Notes (Addendum)
Progress Note  Patient Name: Brittney Li Date of Encounter: 06/25/2017  Primary Cardiologist: Dr. Tenny Craw  Subjective   Breathing improved, back to 3L [place] (baseline level according to the patient). She denies any chest pain or palpitations. Still feels weak.   Inpatient Medications    Scheduled Meds: . amLODipine  10 mg Oral Daily   And  . benazepril  20 mg Oral Daily  . aspirin  81 mg Oral Daily  . atorvastatin  20 mg Oral q1800  . donepezil  10 mg Oral QHS  . furosemide  80 mg Intravenous BID  . mirtazapine  30 mg Oral QHS  . mometasone-formoterol  2 puff Inhalation BID  . oxybutynin  5 mg Oral Daily  . potassium chloride  60 mEq Oral BID  . sodium chloride flush  3 mL Intravenous Q12H   Continuous Infusions: . sodium chloride     PRN Meds: sodium chloride, acetaminophen, albuterol, ondansetron (ZOFRAN) IV, sodium chloride flush   Vital Signs    Vitals:   06/24/17 1046 06/24/17 1312 06/24/17 2203 06/25/17 0610  BP: (!) 102/45 (!) 95/51 (!) 110/55 (!) 100/58  Pulse:  61 (!) 55 (!) 55  Resp:  18 18 18   Temp:  98.5 F (36.9 C) 98.2 F (36.8 C)   TempSrc:  Oral Oral   SpO2:  96% 100% 100%  Weight:    172 lb 9.9 oz (78.3 kg)  Height:        Intake/Output Summary (Last 24 hours) at 06/25/2017 0733 Last data filed at 06/25/2017 1191 Gross per 24 hour  Intake 720 ml  Output 200 ml  Net 520 ml   Filed Weights   06/23/17 1647 06/24/17 0601 06/25/17 0610  Weight: 167 lb 15.9 oz (76.2 kg) 182 lb 1.6 oz (82.6 kg) 172 lb 9.9 oz (78.3 kg)    Telemetry    Sinus bradycardia, HR in mid-50's to 60's. Occasional PVC's. Episode of narrow-complex tachycardia overnight for 9 beats, most consistent with atrial tach.  - Personally Reviewed  ECG    No new tracings.   Physical Exam   General: Well developed, well nourished African American female appearing in no acute distress. Head: Normocephalic, atraumatic.  Neck: Supple without bruits, JVD at 8cm. Lungs:  Resp  regular and unlabored, mild rales along bases bilaterally. Heart: RRR, S1, S2, no S3, S4, or murmur; no rub. Abdomen: Soft, non-tender, non-distended with normoactive bowel sounds. No hepatomegaly. No rebound/guarding. No obvious abdominal masses. Extremities: No clubbing or cyanosis, trace lower extremity edema. Distal pedal pulses are 2+ bilaterally. Neuro: Alert and oriented X 3. Moves all extremities spontaneously. Psych: Normal affect.  Labs    Chemistry Recent Labs  Lab 06/23/17 1259 06/24/17 0320 06/25/17 0426  NA 143 143 143  K 5.0 4.9 5.1  CL 98* 100* 100*  CO2 34* 34* 34*  GLUCOSE 97 104* 84  BUN 61* 55* 52*  CREATININE 1.64* 1.58* 1.43*  CALCIUM 9.2 8.7* 8.9  GFRNONAA 27* 29* 32*  GFRAA 32* 33* 38*  ANIONGAP 11 9 9      Hematology Recent Labs  Lab 06/23/17 1259 06/25/17 0426  WBC 5.4 4.9  RBC 4.47 4.15  HGB 12.9 11.9*  HCT 38.6 36.1  MCV 86.4 87.0  MCH 28.9 28.7  MCHC 33.4 33.0  RDW 14.6 14.5  PLT 46* 107*    Cardiac Enzymes Recent Labs  Lab 06/23/17 1259 06/23/17 1638 06/23/17 2143 06/24/17 0320  TROPONINI 0.15* 0.17* 0.21* 0.20*  No results for input(s): TROPIPOC in the last 168 hours.   BNP Recent Labs  Lab 06/23/17 1259  BNP 749.0*     DDimer No results for input(s): DDIMER in the last 168 hours.   Radiology    Dg Chest 2 View  Result Date: 06/23/2017 CLINICAL DATA:  Dyspnea EXAM: CHEST  2 VIEW COMPARISON:  02/27/2013 chest radiograph. FINDINGS: Stable cardiomediastinal silhouette with mild cardiomegaly and aortic atherosclerosis. No pneumothorax. No pleural effusion. Hyperinflated lungs. No overt pulmonary edema. No acute consolidative airspace disease. IMPRESSION: 1. Hyperinflated lungs, suggesting COPD. No acute pulmonary disease. 2. Stable cardiomegaly without overt pulmonary edema. Electronically Signed   By: Delbert PhenixJason A Poff M.D.   On: 06/23/2017 12:05    Cardiac Studies   Echocardiogram: 06/24/2017 Study Conclusions  - Left  ventricle: The cavity size was normal. Wall thickness was   increased in a pattern of severe LVH. Systolic function was   normal. The estimated ejection fraction was in the range of 55%   to 60%. Wall motion was normal; there were no regional wall   motion abnormalities. Features are consistent with a pseudonormal   left ventricular filling pattern, with concomitant abnormal   relaxation and increased filling pressure (grade 2 diastolic   dysfunction). Doppler parameters are consistent with high   ventricular filling pressure. - Aortic valve: Mildly calcified annulus. Trileaflet; mildly   thickened leaflets. Valve area (VTI): 1.76 cm^2. Valve area   (Vmax): 2.13 cm^2. - Mitral valve: There was mild regurgitation. - Left atrium: The atrium was severely dilated. - Right ventricle: The cavity size was moderately dilated. TAPSE:   16.6 mm . Lateral annulus peak S velocity: 9.68 cm/s. - Right atrium: The atrium was severely dilated. - Atrial septum: No defect or patent foramen ovale was identified. - Pulmonary arteries: Systolic pressure was moderately increased.   PA peak pressure: 53 mm Hg (S).  Patient Profile     84 y.o. female w/ PMH of chronic diastolic CHF, COPD, HTN, HLD, and Type 2 DM who [facility]2017 for evaluation of worsening dyspnea. Cardiology consulted for acute CHF exacerbation.   Assessment & Plan    1. Acute on Chronic Diastolic CHF - presented with worsening dyspnea, found to have en elevated BNP to 749 and CXR showing cardiomegaly with no overt pulmonary edema.  - she has diuresed with IV Lasix but I&O's have been inaccurate secondary to initial incontinence. Weight has been variable but was at 174 lbs on admission, at 172 lbs today. Creatinine has trended down from 1.64 to 1.43 which is close to baseline. Her volume status appears close to baseline on examination.  - prior notes mention different doses of Torsemide she was on PTA and the  patient herself is unsure of the exact dosing as this is managed by her home health nurse. It appears she was on Torsemide 40mg  BID alternating with 60mg  BID at the time of her Cardiology visit in 07/2016. Consider transitioning back to 40mg  BID with close outpatient follow-up and further dose adjustment at that time if indicated.   2. HTN - BP has been well-controlled, actually soft at 100/58 this morning, which is likely being influenced by IV Lasix. On Amlodipine-Benazepril 10-20mg  daily PTA. Hold parameters in place.   3. HLD - remains on PTA Atorvastatin 20mg  daily.   4. Elevated Troponin - cyclic values have been flat at 0.15, 0.17, 0.21, and 0.20. EKG shows no acute ischemic changes. Thought to be secondary to  demand ischemia in the setting of her acute CHF exacerbation and CKD.  - echo shows a preserved EF of 55 - 60% with Grade 2 DD and no regional WMA. No plans for further inpatient ischemic evaluation.   5. COPD - per admitting team. No acute wheezing at this time.    For questions or updates, please contact [facility] Please consult [email or web address] for contact info under Cardiology/STEMI.   Lorri Frederick , PA-C 7:33 AM 06/25/2017 Pager: [phone] Attending note Patient seen and discussed with PA Iran Ouch, I agree with her documentaiton above. Admited with acute on chronic diastolic HF. I/Os incomplete this admission. She has been on IV lasix 80mg  bid, downtrend in Cr and BUN with diuresis consistent with venous congestion and CHF. She reports she nearing her baseline from a breathing standpoint but not quite back. Continue IV diuretics today, likely can change to toresmide 60mg  in AM and 40mg  in pm tomorrow. Episode of what sounds like vertigo this morning, will give dose of meclizine. Episode was not positional, nothing on monitor during time. Feeling of room spinning. Soft bp's this am, I have lowered her norvasc and benazepril.    Dina Rich MD

## 2017-06-25 NOTE — Progress Notes (Signed)
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  Started on intravenous Lasix.  Cardiology following.  Patient is improving.      Assessment & Plan:   Principal Problem:   Acute on chronic diastolic heart failure (HCC) Active Problems:   HLD (hyperlipidemia)   Dementia   Essential hypertension   GERD   Acute on chronic respiratory failure with hypoxia (HCC)   CKD (chronic kidney disease) stage 3, GFR 30-59 ml/min (HCC)   Cardiorenal syndrome with renal failure   CHF exacerbation (HCC)   Thrombocytopenia (HCC)   1. Acute on chronic diastolic congestive heart failure.  Started on intravenous Lasix.  Good urine output.  Volume status is improving.  Continue IV diuretics for today.  Anticipate transitioning to oral diuretics in a.m.  Cardiology following.  Echocardiogram shows normal ejection fraction with grade 2 diastolic dysfunction. 2. Acute on chronic respiratory failure.  Related to CHF exacerbation.  She is chronically on 3 L.  Respiratory status appears to be approaching baseline. 3. Chronic kidney disease stage III.  Creatinine is near baseline and is improving with diuresis.  Continue to monitor in the setting of diuresis 4. COPD.  No wheezing at this time.  Continue on Dulera. 5. Elevated troponin.  Suspect this is related to decompensated CHF in the setting of chronic kidney disease.  No significant rise in troponin to indicate ACS.  No further workup planned at this time. 6. Hyperlipidemia.  Continue statin 7. Hypertension.  Blood pressures running low today.  Amlodipine and benazepril dose adjusted by cardiology. 8. Dementia.  Mild.  Continue on Aricept. 9. Thrombocytopenia.  No prior history of the same.  Follow-up labs show improvement.  She does not appear septic  or toxic.  B12 is normal.  Continue to monitor.   DVT prophylaxis: SCDs Code Status: Full code Family Communication: No family present Disposition Plan: Discharge home once improved   Consultants:   Cardiology  Procedures:  Echo:- Left ventricle: The cavity size was normal. Wall thickness was   increased in a pattern of severe LVH. Systolic function was   normal. The estimated ejection fraction was in the range of 55%   to 60%. Wall motion was normal; there were no regional wall   motion abnormalities. Features are consistent with a pseudonormal   left ventricular filling pattern, with concomitant abnormal   relaxation and increased filling pressure (grade 2 diastolic   dysfunction). Doppler parameters are consistent with high   ventricular filling pressure. - Aortic valve: Mildly calcified annulus. Trileaflet; mildly   thickened leaflets. Valve area (VTI): 1.76 cm^2. Valve area   (Vmax): 2.13 cm^2. - Mitral valve: There was mild regurgitation. - Left atrium: The atrium was severely dilated. - Right ventricle: The cavity size was moderately dilated. TAPSE:   16.6 mm . Lateral annulus peak S velocity: 9.68 cm/s. - Right atrium: The atrium was severely dilated. - Atrial septum: No defect or patent foramen ovale was identified. - Pulmonary arteries: Systolic pressure was moderately increased.   PA peak pressure: 53 mm Hg (S).  Antimicrobials:       Subjective: Feels her breathing is not back to baseline yet.  Still feels mildly short of breath.  No chest pain.  Objective: Vitals:   06/25/17 0610 06/25/17 0924 06/25/17 1000 06/25/17 1228  BP: (!) 100/58  (!) 96/42 (!) 100/33  Pulse: (!) 55   (!) 59  Resp: 18     Temp:      TempSrc:      SpO2: 100% 98%  99%  Weight: 78.3 kg (172 lb 9.9 oz)     Height:        Intake/Output Summary (Last 24 hours) at 06/25/2017 1824 Last data filed at 06/25/2017 1500 Gross per 24 hour  Intake -  Output 1100 ml  Net -1100 ml    Filed Weights   06/23/17 1647 06/24/17 0601 06/25/17 0610  Weight: 76.2 kg (167 lb 15.9 oz) 82.6 kg (182 lb 1.6 oz) 78.3 kg (172 lb 9.9 oz)    Examination:  General exam: Appears calm and comfortable  Respiratory system: Clear to auscultation. Respiratory effort normal. Cardiovascular system: S1 & S2 heard, RRR. No JVD, murmurs, rubs, gallops or clicks. No pedal edema. Gastrointestinal system: Abdomen is nondistended, soft and nontender. No organomegaly or masses felt. Normal bowel sounds heard. Central nervous system: Alert and oriented. No focal neurological deficits. Extremities: Symmetric 5 x 5 power. Skin: No rashes, lesions or ulcers Psychiatry: Judgement and insight appear normal. Mood & affect appropriate.     Data Reviewed: I have personally reviewed following labs and imaging studies  CBC: Recent Labs  Lab 06/23/17 1259 06/25/17 0426  WBC 5.4 4.9  NEUTROABS 4.0 2.5  HGB 12.9 11.9*  HCT 38.6 36.1  MCV 86.4 87.0  PLT 46* 107*   Basic Metabolic Panel: Recent Labs  Lab 06/23/17 1259 06/24/17 0320 06/25/17 0426  NA 143 143 143  K 5.0 4.9 5.1  CL 98* 100* 100*  CO2 34* 34* 34*  GLUCOSE 97 104* 84  BUN 61* 55* 52*  CREATININE 1.64* 1.58* 1.43*  CALCIUM 9.2 8.7* 8.9   GFR: Estimated Creatinine Clearance: 29.7 mL/min (A) (by C-G formula based on SCr of 1.43 mg/dL (H)). Liver Function Tests: No results for input(s): AST, ALT, ALKPHOS, BILITOT, PROT, ALBUMIN in the last 168 hours. No results for input(s): LIPASE, AMYLASE in the last 168 hours. No results for input(s): AMMONIA in the last 168 hours. Coagulation Profile: No results for input(s): INR, PROTIME in the last 168 hours. Cardiac Enzymes: Recent Labs  Lab 06/23/17 1259 06/23/17 1638 06/23/17 2143 06/24/17 0320  TROPONINI 0.15* 0.17* 0.21* 0.20*   BNP (last 3 results) No results for input(s): PROBNP in the last 8760 hours. HbA1C: No results for input(s): HGBA1C in the last 72  hours. CBG: No results for input(s): GLUCAP in the last 168 hours. Lipid Profile: No results for input(s): CHOL, HDL, LDLCALC, TRIG, CHOLHDL, LDLDIRECT in the last 72 hours. Thyroid Function Tests: No results for input(s): TSH, T4TOTAL, FREET4, T3FREE, THYROIDAB in the last 72 hours. Anemia Panel: Recent Labs    06/25/17 0426  VITAMINB12 565   Sepsis Labs: No results for input(s): PROCALCITON, LATICACIDVEN in the last 168 hours.  No results found for this or any previous visit (from the past 240 hour(s)).       Radiology Studies: No results found.      Scheduled Meds: . amLODipine  5 mg Oral Daily   And  . benazepril  10 mg Oral Daily  . aspirin  81 mg Oral Daily  . atorvastatin  20 mg Oral q1800  . donepezil  10 mg Oral QHS  . furosemide  80 mg Intravenous BID  . mirtazapine  30 mg Oral QHS  . mometasone-formoterol  2 puff  Inhalation BID  . oxybutynin  5 mg Oral Daily  . potassium chloride  60 mEq Oral BID  . sodium chloride flush  3 mL Intravenous Q12H   Continuous Infusions: . sodium chloride       LOS: 2 days    Time spent: 25mins    Erick BlinksJehanzeb Laureen Frederic, MD [facility] Pager [phone]  If 7PM-7AM, please contact night-coverage [email or web address] Password [email or web address] 06/25/2017, 6:24 PM

## 2017-06-25 NOTE — Care Management Note (Signed)
Case Management Note  Patient Details  Name: Brittney Li MRN: [id number] Date of Birth: 10/04/1929    Expected Discharge Date:      06/26/17            Expected Discharge Plan:  Home w Home Health Services  Discharge planning Services  CM Consult  Post Acute Care Choice:  Home Health Choice offered to:  Patient  DME Arranged:  Wheelchair manual DME Agency:  [facility]  [facility]  [facility]  Status of Service:  Completed, signed off  If discussed at Long Length of Stay Meetings, dates discussed:    Additional Comments: DC planned for tomorrow. [facility] rep, aware, WC will be delivered today. Pt aware HH has 48 hrs to make first visit.    [name], Ameera Tigue Demske, RN 06/25/2017, 1:33 PM

## 2017-06-25 NOTE — Care Management Important Message (Signed)
Important Message  Patient Details  Name: Brittney Li MRN: [id number] Date of Birth: 01/14/1930   Medicare Important Message Given:  Yes    [name], Arantza Darrington Demske, RN 06/25/2017, 1:14 PM

## 2017-06-26 LAB — BASIC METABOLIC PANEL
ANION GAP: 8 (ref 5–15)
BUN: 46 mg/dL — ABNORMAL HIGH (ref 6–20)
CHLORIDE: 100 mmol/L — AB (ref 101–111)
CO2: 36 mmol/L — ABNORMAL HIGH (ref 22–32)
Calcium: 8.7 mg/dL — ABNORMAL LOW (ref 8.9–10.3)
Creatinine, Ser: 1.31 mg/dL — ABNORMAL HIGH (ref 0.44–1.00)
GFR, EST AFRICAN AMERICAN: 42 mL/min — AB (ref >60)
GFR, EST NON AFRICAN AMERICAN: 36 mL/min — AB (ref >60)
Glucose, Bld: 86 mg/dL (ref 65–99)
POTASSIUM: 5.4 mmol/L — AB (ref 3.5–5.1)
Sodium: 144 mmol/L (ref 135–145)

## 2017-06-26 MED ORDER — POTASSIUM CHLORIDE 20 MEQ PO PACK
40.0000 meq | PACK | Freq: Two times a day (BID) | ORAL | Status: DC
  Administered 2017-06-26: 40 meq via ORAL
  Filled 2017-06-26: qty 2

## 2017-06-26 NOTE — Plan of Care (Signed)
  Progressing Education: Knowledge of General Education information will improve 06/26/2017 1241 - Progressing by Wynne Dusthomas, Anikin Prosser, RN Clinical Measurements: Will remain free from infection 06/26/2017 1241 - Progressing by Wynne Dusthomas, Shatarra Wehling, RN Respiratory complications will improve 06/26/2017 1241 - Progressing by Wynne Dusthomas, Bailie Christenbury, RN Cardiovascular complication will be avoided 06/26/2017 1241 - Progressing by Wynne Dusthomas, Matthew Cina, RN Activity: Risk for activity intolerance will decrease 06/26/2017 1241 - Progressing by Wynne Dusthomas, Shakiyla Kook, RN Nutrition: Adequate nutrition will be maintained 06/26/2017 1241 - Progressing by Wynne Dusthomas, Meryn Sarracino, RN Safety: Ability to remain free from injury will improve 06/26/2017 1241 - Progressing by Wynne Dusthomas, Atalaya Zappia, RN Skin Integrity: Risk for impaired skin integrity will decrease 06/26/2017 1241 - Progressing by Wynne Dusthomas, Lidya Mccalister, RN Activity: Capacity to carry out activities will improve 06/26/2017 1241 - Progressing by Wynne Dusthomas, Eydan Chianese, RN

## 2017-06-26 NOTE — Progress Notes (Signed)
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  Started on intravenous Lasix.  Cardiology following.  Patient is improving.      Assessment & Plan:   Principal Problem:   Acute on chronic diastolic heart failure (HCC) Active Problems:   HLD (hyperlipidemia)   Dementia   Essential hypertension   GERD   Acute on chronic respiratory failure with hypoxia (HCC)   CKD (chronic kidney disease) stage 3, GFR 30-59 ml/min (HCC)   Cardiorenal syndrome with renal failure   CHF exacerbation (HCC)   Thrombocytopenia (HCC)   1. Acute on chronic diastolic congestive heart failure.  Started on intravenous Lasix.  Good urine output.  Volume status is improving.  Continue IV diuretics for today.  Creatinine improving with diuresis.  Anticipate transitioning to oral diuretics in a.m if creatinine starts to trend up.  Cardiology following.  Echocardiogram shows normal ejection fraction with grade 2 diastolic dysfunction. 2. Acute on chronic respiratory failure.  Related to CHF exacerbation.  She is chronically on 3 L.  Respiratory status appears to be approaching baseline. 3. Chronic kidney disease stage III.  Creatinine is near baseline and continues to improve with diuresis.  Continue to monitor in the setting of diuresis 4. COPD.  No wheezing at this time.  Continue on Dulera. 5. Elevated troponin.  Suspect this is related to decompensated CHF in the setting of chronic kidney disease.  No significant rise in troponin to indicate ACS.  No further workup planned at this time. 6. Hyperlipidemia.  Continue statin 7. Hypertension.  Blood pressures running low today.  Amlodipine and benazepril dose adjusted by cardiology. 8. Dementia.  Mild.  Continue on Aricept. 9. Thrombocytopenia.  No prior  history of the same.  Follow-up labs show improvement.  She does not appear septic or toxic.  B12 is normal.  Continue to monitor.   DVT prophylaxis: SCDs Code Status: Full code Family Communication: No family present Disposition Plan: Discharge home once improved   Consultants:   Cardiology  Procedures:  Echo:- Left ventricle: The cavity size was normal. Wall thickness was   increased in a pattern of severe LVH. Systolic function was   normal. The estimated ejection fraction was in the range of 55%   to 60%. Wall motion was normal; there were no regional wall   motion abnormalities. Features are consistent with a pseudonormal   left ventricular filling pattern, with concomitant abnormal   relaxation and increased filling pressure (grade 2 diastolic   dysfunction). Doppler parameters are consistent with high   ventricular filling pressure. - Aortic valve: Mildly calcified annulus. Trileaflet; mildly   thickened leaflets. Valve area (VTI): 1.76 cm^2. Valve area   (Vmax): 2.13 cm^2. - Mitral valve: There was mild regurgitation. - Left atrium: The atrium was severely dilated. - Right ventricle: The cavity size was moderately dilated. TAPSE:   16.6 mm . Lateral annulus peak S velocity: 9.68 cm/s. - Right atrium: The atrium was severely dilated. - Atrial septum: No defect or patent foramen ovale was identified. - Pulmonary arteries: Systolic pressure was moderately increased.   PA peak pressure: 53 mm Hg (S).  Antimicrobials:       Subjective: Still feels short of breath.  Feels that breathing is not back to baseline yet.  No chest pain  Objective:  Vitals:   06/26/17 0641 06/26/17 0747 06/26/17 0934 06/26/17 1629  BP: (!) 101/43  (!) 97/52 (!) 113/59  Pulse: (!) 52  (!) 51 (!) 49  Resp:    20  Temp: 98.2 F (36.8 C)   98.5 F (36.9 C)  TempSrc: Oral   Oral  SpO2: 100% 99% 100% 94%  Weight: 78.5 kg (173 lb 1 oz)     Height:        Intake/Output Summary (Last 24  hours) at 06/26/2017 1644 Last data filed at 06/26/2017 1623 Gross per 24 hour  Intake 243 ml  Output 2150 ml  Net -1907 ml   Filed Weights   06/24/17 0601 06/25/17 0610 06/26/17 0641  Weight: 82.6 kg (182 lb 1.6 oz) 78.3 kg (172 lb 9.9 oz) 78.5 kg (173 lb 1 oz)    Examination:  General exam: Appears calm and comfortable  Respiratory system: Clear to auscultation. Respiratory effort normal. Cardiovascular system: S1 & S2 heard, RRR. No JVD, murmurs, rubs, gallops or clicks. 1+pedal edema. Gastrointestinal system: Abdomen is nondistended, soft and nontender. No organomegaly or masses felt. Normal bowel sounds heard. Central nervous system: Alert and oriented. No focal neurological deficits. Extremities: Symmetric 5 x 5 power. Skin: No rashes, lesions or ulcers Psychiatry: Judgement and insight appear normal. Mood & affect appropriate.     Data Reviewed: I have personally reviewed following labs and imaging studies  CBC: Recent Labs  Lab 06/23/17 1259 06/25/17 0426  WBC 5.4 4.9  NEUTROABS 4.0 2.5  HGB 12.9 11.9*  HCT 38.6 36.1  MCV 86.4 87.0  PLT 46* 107*   Basic Metabolic Panel: Recent Labs  Lab 06/23/17 1259 06/24/17 0320 06/25/17 0426 06/26/17 0655  NA 143 143 143 144  K 5.0 4.9 5.1 5.4*  CL 98* 100* 100* 100*  CO2 34* 34* 34* 36*  GLUCOSE 97 104* 84 86  BUN 61* 55* 52* 46*  CREATININE 1.64* 1.58* 1.43* 1.31*  CALCIUM 9.2 8.7* 8.9 8.7*   GFR: Estimated Creatinine Clearance: 32.5 mL/min (A) (by C-G formula based on SCr of 1.31 mg/dL (H)). Liver Function Tests: No results for input(s): AST, ALT, ALKPHOS, BILITOT, PROT, ALBUMIN in the last 168 hours. No results for input(s): LIPASE, AMYLASE in the last 168 hours. No results for input(s): AMMONIA in the last 168 hours. Coagulation Profile: No results for input(s): INR, PROTIME in the last 168 hours. Cardiac Enzymes: Recent Labs  Lab 06/23/17 1259 06/23/17 1638 06/23/17 2143 06/24/17 0320  TROPONINI  0.15* 0.17* 0.21* 0.20*   BNP (last 3 results) No results for input(s): PROBNP in the last 8760 hours. HbA1C: No results for input(s): HGBA1C in the last 72 hours. CBG: No results for input(s): GLUCAP in the last 168 hours. Lipid Profile: No results for input(s): CHOL, HDL, LDLCALC, TRIG, CHOLHDL, LDLDIRECT in the last 72 hours. Thyroid Function Tests: No results for input(s): TSH, T4TOTAL, FREET4, T3FREE, THYROIDAB in the last 72 hours. Anemia Panel: Recent Labs    06/25/17 0426  VITAMINB12 565   Sepsis Labs: No results for input(s): PROCALCITON, LATICACIDVEN in the last 168 hours.  No results found for this or any previous visit (from the past 240 hour(s)).       Radiology Studies: No results found.      Scheduled Meds: . amLODipine  5 mg Oral Daily   And  . benazepril  10 mg Oral Daily  . aspirin  81 mg Oral Daily  . atorvastatin  20 mg Oral q1800  .  donepezil  10 mg Oral QHS  . furosemide  80 mg Intravenous BID  . mirtazapine  30 mg Oral QHS  . mometasone-formoterol  2 puff Inhalation BID  . oxybutynin  5 mg Oral Daily  . sodium chloride flush  3 mL Intravenous Q12H   Continuous Infusions: . sodium chloride       LOS: 3 days    Time spent:    Erick Blinks, MD [facility] Pager [phone]  If 7PM-7AM, please contact night-coverage [email or web address] Password [facility] 06/26/2017, 4:44 PM

## 2017-06-26 NOTE — Progress Notes (Signed)
Spoke with Home health nurse Mayme GentaLoraine Forse concerning patient's discharge instructions. She needs to know if patient will be discharged because there is noone from home health to assist her until Monday. Informed her that we will update her as soon as possible.

## 2017-06-26 NOTE — Progress Notes (Addendum)
Patient's Norvasc and Lotensin held this morning. Blood pressure 97/52- Pulse 51. Patient verbally denies dizziness or feeling light headed. Will continue to monitor

## 2017-06-27 LAB — BASIC METABOLIC PANEL
Anion gap: 11 (ref 5–15)
BUN: 40 mg/dL — AB (ref 6–20)
CALCIUM: 9 mg/dL (ref 8.9–10.3)
CHLORIDE: 99 mmol/L — AB (ref 101–111)
CO2: 35 mmol/L — ABNORMAL HIGH (ref 22–32)
CREATININE: 1.14 mg/dL — AB (ref 0.44–1.00)
GFR calc Af Amer: 49 mL/min — ABNORMAL LOW (ref >60)
GFR calc non Af Amer: 43 mL/min — ABNORMAL LOW (ref >60)
Glucose, Bld: 89 mg/dL (ref 65–99)
Potassium: 4.2 mmol/L (ref 3.5–5.1)
SODIUM: 145 mmol/L (ref 135–145)

## 2017-06-27 LAB — CBC
HCT: 37 % (ref 36.0–46.0)
Hemoglobin: 11.8 g/dL — ABNORMAL LOW (ref 12.0–15.0)
MCH: 28.6 pg (ref 26.0–34.0)
MCHC: 31.9 g/dL (ref 30.0–36.0)
MCV: 89.6 fL (ref 78.0–100.0)
PLATELETS: 90 10*3/uL — AB (ref 150–400)
RBC: 4.13 MIL/uL (ref 3.87–5.11)
RDW: 14.8 % (ref 11.5–15.5)
WBC: 5 10*3/uL (ref 4.0–10.5)

## 2017-06-27 NOTE — Progress Notes (Signed)
PROGRESS NOTE    Brittney Li  ZOX:[id number] DOB: 1930-02-11 DOA: 06/23/2017 PCP: Avon Gully, MD    Brief Narrative:  84 year old female with a history of oxygen dependent COPD on 3 L, presented with decompensated diastolic congestive heart failure.  Started on intravenous Lasix.  Cardiology following.  Patient is improving.     Assessment & Plan:   Principal Problem:   Acute on chronic diastolic heart failure (HCC) Active Problems:   HLD (hyperlipidemia)   Dementia   Essential hypertension   GERD   Acute on chronic respiratory failure with hypoxia (HCC)   CKD (chronic kidney disease) stage 3, GFR 30-59 ml/min (HCC)   Cardiorenal syndrome with renal failure   CHF exacerbation (HCC)   Thrombocytopenia (HCC)   1. Acute on chronic diastolic congestive heart failure.  Started on intravenous Lasix.  Good urine output.  Volume status is -3.5 L since admission.  Weights have been unreliable..  Continue IV diuretics for today.  Creatinine improving with diuresis.  Anticipate transitioning to oral diuretics in a.m if creatinine starts to trend up.  Cardiology following.  Echocardiogram shows normal ejection fraction with grade 2 diastolic dysfunction. 2. Acute on chronic respiratory failure.  Related to CHF exacerbation.  She is chronically on 3 L.  Respiratory status appears to be approaching baseline. 3. Chronic kidney disease stage III.  Creatinine continues to improve with diuresis.  Continue to monitor in the setting of diuresis 4. COPD.  No wheezing at this time.  Continue on Dulera. 5. Elevated troponin.  Suspect this is related to decompensated CHF in the setting of chronic kidney disease.  No significant rise in troponin to indicate ACS.  No further workup planned at this time. 6. Hyperlipidemia.  Continue statin 7. Hypertension.  Blood pressures running low.  Amlodipine and benazepril dose adjusted by cardiology. 8. Dementia.  Mild.  Continue on Aricept. 9. Thrombocytopenia.   No prior history of the same.  Follow-up labs show improvement.  She does not appear septic or toxic.  B12 is normal.  Continue to monitor.   DVT prophylaxis: SCDs Code Status: Full code Family Communication: No family present Disposition Plan: Discharge home once improved   Consultants:   Cardiology  Procedures:  Echo:- Left ventricle: The cavity size was normal. Wall thickness was   increased in a pattern of severe LVH. Systolic function was   normal. The estimated ejection fraction was in the range of 55%   to 60%. Wall motion was normal; there were no regional wall   motion abnormalities. Features are consistent with a pseudonormal   left ventricular filling pattern, with concomitant abnormal   relaxation and increased filling pressure (grade 2 diastolic   dysfunction). Doppler parameters are consistent with high   ventricular filling pressure. - Aortic valve: Mildly calcified annulus. Trileaflet; mildly   thickened leaflets. Valve area (VTI): 1.76 cm^2. Valve area   (Vmax): 2.13 cm^2. - Mitral valve: There was mild regurgitation. - Left atrium: The atrium was severely dilated. - Right ventricle: The cavity size was moderately dilated. TAPSE:   16.6 mm . Lateral annulus peak S velocity: 9.68 cm/s. - Right atrium: The atrium was severely dilated. - Atrial septum: No defect or patent foramen ovale was identified. - Pulmonary arteries: Systolic pressure was moderately increased.   PA peak pressure: 53 mm Hg (S).  Antimicrobials:       Subjective: Slowly improving.  Able to ambulate more today.  Feels less short of breath.  Objective: Vitals:  06/27/17 0605 06/27/17 0737 06/27/17 0946 06/27/17 1438  BP:   (!) 113/54 (!) 95/43  Pulse:   62 62  Resp:    18  Temp:    98.5 F (36.9 C)  TempSrc:    Oral  SpO2:  96%  96%  Weight: 77 kg (169 lb 12.1 oz)     Height:        Intake/Output Summary (Last 24 hours) at 06/27/2017 1538 Last data filed at 06/27/2017  1440 Gross per 24 hour  Intake 720 ml  Output 2050 ml  Net -1330 ml   Filed Weights   06/25/17 0610 06/26/17 0641 06/27/17 0605  Weight: 78.3 kg (172 lb 9.9 oz) 78.5 kg (173 lb 1 oz) 77 kg (169 lb 12.1 oz)    Examination:  General exam: Appears calm and comfortable  Respiratory system: Crackles at bases. respiratory effort normal. Cardiovascular system: S1 & S2 heard, RRR. No JVD, murmurs, rubs, gallops or clicks. 1+pedal edema. Gastrointestinal system: Abdomen is nondistended, soft and nontender. No organomegaly or masses felt. Normal bowel sounds heard. Central nervous system: Alert and oriented. No focal neurological deficits. Extremities: Symmetric 5 x 5 power. Skin: No rashes, lesions or ulcers Psychiatry: Judgement and insight appear normal. Mood & affect appropriate.     Data Reviewed: I have personally reviewed following labs and imaging studies  CBC: Recent Labs  Lab 06/23/17 1259 06/25/17 0426 06/27/17 0455  WBC 5.4 4.9 5.0  NEUTROABS 4.0 2.5  --   HGB 12.9 11.9* 11.8*  HCT 38.6 36.1 37.0  MCV 86.4 87.0 89.6  PLT 46* 107* 90*   Basic Metabolic Panel: Recent Labs  Lab 06/23/17 1259 06/24/17 0320 06/25/17 0426 06/26/17 0655 06/27/17 0455  NA 143 143 143 144 145  K 5.0 4.9 5.1 5.4* 4.2  CL 98* 100* 100* 100* 99*  CO2 34* 34* 34* 36* 35*  GLUCOSE 97 104* 84 86 89  BUN 61* 55* 52* 46* 40*  CREATININE 1.64* 1.58* 1.43* 1.31* 1.14*  CALCIUM 9.2 8.7* 8.9 8.7* 9.0   GFR: Estimated Creatinine Clearance: 37 mL/min (A) (by C-G formula based on SCr of 1.14 mg/dL (H)). Liver Function Tests: No results for input(s): AST, ALT, ALKPHOS, BILITOT, PROT, ALBUMIN in the last 168 hours. No results for input(s): LIPASE, AMYLASE in the last 168 hours. No results for input(s): AMMONIA in the last 168 hours. Coagulation Profile: No results for input(s): INR, PROTIME in the last 168 hours. Cardiac Enzymes: Recent Labs  Lab 06/23/17 1259 06/23/17 1638 06/23/17 2143  06/24/17 0320  TROPONINI 0.15* 0.17* 0.21* 0.20*   BNP (last 3 results) No results for input(s): PROBNP in the last 8760 hours. HbA1C: No results for input(s): HGBA1C in the last 72 hours. CBG: No results for input(s): GLUCAP in the last 168 hours. Lipid Profile: No results for input(s): CHOL, HDL, LDLCALC, TRIG, CHOLHDL, LDLDIRECT in the last 72 hours. Thyroid Function Tests: No results for input(s): TSH, T4TOTAL, FREET4, T3FREE, THYROIDAB in the last 72 hours. Anemia Panel: Recent Labs    06/25/17 0426  VITAMINB12 565   Sepsis Labs: No results for input(s): PROCALCITON, LATICACIDVEN in the last 168 hours.  No results found for this or any previous visit (from the past 240 hour(s)).       Radiology Studies: No results found.      Scheduled Meds: . amLODipine  5 mg Oral Daily   And  . benazepril  10 mg Oral Daily  . aspirin  81 mg Oral  Daily  . atorvastatin  20 mg Oral q1800  . donepezil  10 mg Oral QHS  . furosemide  80 mg Intravenous BID  . mirtazapine  30 mg Oral QHS  . mometasone-formoterol  2 puff Inhalation BID  . oxybutynin  5 mg Oral Daily  . sodium chloride flush  3 mL Intravenous Q12H   Continuous Infusions: . sodium chloride       LOS: 4 days    Time spent:    Erick Blinks, MD [facility] Pager [phone]  If 7PM-7AM, please contact night-coverage [email or web address] Password [email or web address] 06/27/2017, 3:38 PM

## 2017-06-28 DIAGNOSIS — E785 Hyperlipidemia, unspecified: Secondary | ICD-10-CM

## 2017-06-28 LAB — BASIC METABOLIC PANEL
Anion gap: 9 (ref 5–15)
BUN: 36 mg/dL — ABNORMAL HIGH (ref 6–20)
CHLORIDE: 98 mmol/L — AB (ref 101–111)
CO2: 37 mmol/L — AB (ref 22–32)
Calcium: 8.9 mg/dL (ref 8.9–10.3)
Creatinine, Ser: 1.1 mg/dL — ABNORMAL HIGH (ref 0.44–1.00)
GFR calc non Af Amer: 44 mL/min — ABNORMAL LOW (ref >60)
GFR, EST AFRICAN AMERICAN: 52 mL/min — AB (ref >60)
GLUCOSE: 101 mg/dL — AB (ref 65–99)
Potassium: 3.9 mmol/L (ref 3.5–5.1)
Sodium: 144 mmol/L (ref 135–145)

## 2017-06-28 MED ORDER — TORSEMIDE 20 MG PO TABS: 60.0000 mg | ORAL_TABLET | Freq: Every day | ORAL | Status: DC

## 2017-06-28 MED ORDER — TORSEMIDE 20 MG PO TABS: ORAL_TABLET | ORAL | 0 refills | Status: DC

## 2017-06-28 MED ORDER — POTASSIUM CHLORIDE 20 MEQ PO PACK: 60.0000 meq | PACK | Freq: Two times a day (BID) | ORAL | 0 refills | Status: DC

## 2017-06-28 MED ORDER — BENAZEPRIL HCL 5 MG PO TABS: 10.0000 mg | ORAL_TABLET | Freq: Every day | ORAL | 0 refills | Status: DC

## 2017-06-28 MED ORDER — TORSEMIDE 20 MG PO TABS: 40.0000 mg | ORAL_TABLET | Freq: Every evening | ORAL | Status: DC

## 2017-06-28 NOTE — Progress Notes (Signed)
Progress Note  Patient Name: Brittney Li Date of Encounter: 06/28/2017  Primary Cardiologist: Tenny Craw  Subjective   Breathnig back to baseline  Inpatient Medications    Scheduled Meds: . amLODipine  5 mg Oral Daily   And  . benazepril  10 mg Oral Daily  . aspirin  81 mg Oral Daily  . atorvastatin  20 mg Oral q1800  . donepezil  10 mg Oral QHS  . furosemide  80 mg Intravenous BID  . mirtazapine  30 mg Oral QHS  . mometasone-formoterol  2 puff Inhalation BID  . oxybutynin  5 mg Oral Daily  . sodium chloride flush  3 mL Intravenous Q12H   Continuous Infusions: . sodium chloride     PRN Meds: sodium chloride, acetaminophen, albuterol, ondansetron (ZOFRAN) IV, sodium chloride flush   Vital Signs    Vitals:   06/27/17 2026 06/27/17 2109 06/28/17 0555 06/28/17 0804  BP:  (!) 100/56 101/60   Pulse:  66 64   Resp:  20 20   Temp:  98.5 F (36.9 C) 98.2 F (36.8 C)   TempSrc:  Oral Oral   SpO2: 96% 95% 94% 98%  Weight:   169 lb 15.6 oz (77.1 kg)   Height:        Intake/Output Summary (Last 24 hours) at 06/28/2017 0854 Last data filed at 06/28/2017 0555 Gross per 24 hour  Intake 960 ml  Output 1350 ml  Net -390 ml   Filed Weights   06/26/17 0641 06/27/17 0605 06/28/17 0555  Weight: 173 lb 1 oz (78.5 kg) 169 lb 12.1 oz (77 kg) 169 lb 15.6 oz (77.1 kg)    Telemetry    SR - Personally Reviewed  ECG     Physical Exam   GEN: No acute distress.   Neck: No JVD Cardiac: RRR, no murmurs, rubs, or gallops.  Respiratory: Clear to auscultation bilaterally. GI: Soft, nontender, non-distended  MS: No edema; No deformity. Neuro:  Nonfocal  Psych: Normal affect   Labs    Chemistry Recent Labs  Lab 06/26/17 0655 06/27/17 0455 06/28/17 0524  NA 144 145 144  K 5.4* 4.2 3.9  CL 100* 99* 98*  CO2 36* 35* 37*  GLUCOSE 86 89 101*  BUN 46* 40* 36*  CREATININE 1.31* 1.14* 1.10*  CALCIUM 8.7* 9.0 8.9  GFRNONAA 36* 43* 44*  GFRAA 42* 49* 52*  ANIONGAP 8 11 9       Hematology Recent Labs  Lab 06/23/17 1259 06/25/17 0426 06/27/17 0455  WBC 5.4 4.9 5.0  RBC 4.47 4.15 4.13  HGB 12.9 11.9* 11.8*  HCT 38.6 36.1 37.0  MCV 86.4 87.0 89.6  MCH 28.9 28.7 28.6  MCHC 33.4 33.0 31.9  RDW 14.6 14.5 14.8  PLT 46* 107* 90*    Cardiac Enzymes Recent Labs  Lab 06/23/17 1259 06/23/17 1638 06/23/17 2143 06/24/17 0320  TROPONINI 0.15* 0.17* 0.21* 0.20*   No results for input(s): TROPIPOC in the last 168 hours.   BNP Recent Labs  Lab 06/23/17 1259  BNP 749.0*     DDimer No results for input(s): DDIMER in the last 168 hours.   Radiology    No results found.  Cardiac Studies    Patient Profile     84 y.o. female w/ PMH of chronic diastolic CHF, COPD, HTN, HLD, and Type 2 DM who presented to [facility] ED on 06/23/2017 for evaluation of worsening dyspnea. Cardiology consulted for acute CHF exacerbation.     Assessment &  Plan    1. Acute on chronic diastolic HF - Jan 2019 echo LVEF 55-60%, grade II diastolic dysfunction.  - negative 400mL yesterday, negative 4L since admission. Continued downtrend in Cr and BUN with diuresis consistent with venous congestion and CHF. Currently on lasix 80mg  IV bid - she reports her breathing is back to baseline. Appears euvoelmic by exam. We will d/c IV lasix, start torsemide 60mg  in am and 40mg  in pm.    Ok for discharge from cards standpoint, we will arrange f/u along with labs in 1-2 weeks. We will sign off inpatient care.    For questions or updates, please contact [facility] Please consult [email or web address] for contact info under Cardiology/STEMI.      Joanie CoddingtonSigned, Branch, Jonathan, MD  06/28/2017, 8:54 AM

## 2017-06-28 NOTE — Care Management Note (Signed)
Case Management Note  Patient Details  Name: Brittney Li MRN: [id number] Date of Birth: 11/30/1929  Expected Discharge Date:  06/28/17               Expected Discharge Plan:  Home w Home Health Services  In-House Referral:     Discharge planning Services  CM Consult  Post Acute Care Choice:  Home Health Choice offered to:  Patient  DME Arranged:  Wheelchair manual DME Agency:  [facility]  HH Arranged:  [facility], [facility]  [facility]  Status of Service:  Completed, signed off  Additional Comments: DC today. [facility] rep updated on DC. Orders entered. WC has been delivered.   [name], [name], RN 06/28/2017, 1:24 PM

## 2017-06-28 NOTE — Care Management Important Message (Deleted)
Important Message  Patient Details  Name: Brittney Li MRN: [id number] Date of Birth: 06/13/1929   Medicare Important Message Given:  Yes    [name], Keosha Rossa Demske, RN 06/28/2017, 1:16 PM

## 2017-06-28 NOTE — Plan of Care (Signed)
  Elimination: Will not experience complications related to bowel motility 06/28/2017 1046 - Progressing by Jethro PolingBullins, Logan Baltimore C, RN   Elimination: Will not experience complications related to urinary retention 06/28/2017 1046 - Progressing by Jethro PolingBullins, Jadelin Eng C, RN   Safety: Ability to remain free from injury will improve 06/28/2017 1046 - Progressing by Kip Cropp, Sheryle SprayLisa C, RN   Skin Integrity: Risk for impaired skin integrity will decrease 06/28/2017 1046 - Progressing by Arshawn Valdez, Sheryle SprayLisa C, RN   Pain Managment: General experience of comfort will improve 06/28/2017 1046 - Adequate for Discharge by Jethro PolingBullins, Aliha Diedrich C, RN

## 2017-06-28 NOTE — Progress Notes (Signed)
Pt's IV catheter removed and intact. P'ts IV site clean dry and intact. Discharge instructions including medications and follow up appointments were reviewed and discussed with patient's caregivers Mrs. Brittney Li and Ms Brittney Li. All questions were answered and no further questions at this time. Pt in stable condition and in no acute distress at time of discharge. Pt will be escorted by nurse tech.

## 2017-06-28 NOTE — Care Management Important Message (Signed)
Important Message  Patient Details  Name: Brittney Li MRN: [name] Date of Birth: 12/14/1929   Medicare Important Message Given:  Yes    [name], [name], RN 06/28/2017, 1:15 PM

## 2017-06-28 NOTE — Discharge Summary (Signed)
Physician Discharge Summary  Brittney HawsMary A Li UEA:[id number] DOB: 08/30/1929 DOA: 06/23/2017  PCP: Avon GullyFanta, Tesfaye, MD  Admit date: 06/23/2017 Discharge date: 06/28/2017  Admitted From: Home Disposition: Home  Recommendations for Outpatient Follow-up:  1. Follow up with PCP in 1-2 weeks 2. Please obtain BMP/CBC in one week 3. Follow-up with cardiology in 1-2 weeks  Home Health: Home health RN and PT Equipment/Devices:  Discharge Condition: Stable CODE STATUS: Full code Diet recommendation: Heart Healthy  Brief/Interim Summary: 84 year old female with a history of oxygen dependent COPD on 3 L, presented with decompensated diastolic congestive heart failure.  Started on intravenous Lasix.  Cardiology following.  Marland Kitchen.  Discharge Diagnoses:  Principal Problem:   Acute on chronic diastolic heart failure (HCC) Active Problems:   HLD (hyperlipidemia)   Dementia   Essential hypertension   GERD   Acute on chronic respiratory failure with hypoxia (HCC)   CKD (chronic kidney disease) stage 3, GFR 30-59 ml/min (HCC)   Cardiorenal syndrome with renal failure   CHF exacerbation (HCC)   Thrombocytopenia (HCC)   1. Acute on chronic diastolic congestive heart failure.  Started on intravenous Lasix.  Good urine output.  Volume status is -4 L since admission.  Weights have been unreliable.    Cardiology follow the patient.  She is felt to be euvolemic at this time and she is been transitioned to oral Demadex. Echocardiogram shows normal ejection fraction with grade 2 diastolic dysfunction. 2. Acute on chronic respiratory failure.  Related to CHF exacerbation. She is chronically on 3 L.  Respiratory status appears to be back to baseline. 3. Chronic kidney disease stage III.  Creatinine continues to improve with diuresis.    Follow-up renal function as an outpatient. 4. COPD.  No wheezing at this time.  Continue on Dulera. 5. Elevated troponin.  Suspect this is related to decompensated CHF in the  setting of chronic kidney disease.  No significant rise in troponin to indicate ACS.  No further workup planned at this time. 6. Hyperlipidemia.  Continue statin 7. Hypertension.  Blood pressures running low.    Amlodipine discontinued.  Continue ACE inhibitor. 8. Dementia.  Mild.  Continue on Aricept. 9. Thrombocytopenia.  No prior history of the same.  Follow-up labs show this is stable.  She does not appear septic or toxic.  B12 is normal.  Continue follow-up/workup as an outpatient    Discharge Instructions  Discharge Instructions    Diet - low sodium heart healthy   Complete by:  As directed    Increase activity slowly   Complete by:  As directed      Allergies as of 06/28/2017      Reactions   Erythromycin Other (See Comments)   Unknown    Penicillins Other (See Comments)   Unknown       Medication List    STOP taking these medications   lisinopril 20 MG tablet Commonly known as:  PRINIVIL,ZESTRIL     TAKE these medications   albuterol (2.5 MG/3ML) 0.083% nebulizer solution Commonly known as:  PROVENTIL Take 2.5 mg by nebulization every 6 (six) hours as needed for wheezing.   albuterol 108 (90 Base) MCG/ACT inhaler Commonly known as:  PROVENTIL HFA;VENTOLIN HFA Inhale 2 puffs into the lungs every 6 (six) hours as needed for wheezing.   aspirin 81 MG tablet Take 81 mg by mouth daily.   atorvastatin 20 MG tablet Commonly known as:  LIPITOR Take 20 mg by mouth daily at 6 PM.   benazepril  5 MG tablet Commonly known as:  LOTENSIN Take 2 tablets (10 mg total) by mouth daily. Start taking on:  06/29/2017   docusate sodium 100 MG capsule Commonly known as:  COLACE Take 200 mg by mouth at bedtime.   donepezil 10 MG tablet Commonly known as:  ARICEPT Take 10 mg by mouth at bedtime.   famotidine 20 MG tablet Commonly known as:  PEPCID Take 20 mg by mouth daily.   mirtazapine 30 MG tablet Commonly known as:  REMERON Take 30 mg by mouth at bedtime.    mometasone-formoterol 200-5 MCG/ACT Aero Commonly known as:  DULERA Inhale 2 puffs into the lungs 2 (two) times daily.   multivitamin with minerals Tabs tablet Take 1 tablet by mouth daily.   oxybutynin 5 MG tablet Commonly known as:  DITROPAN Take 5 mg by mouth daily.   potassium chloride 20 MEQ packet Commonly known as:  KLOR-CON Take 60 mEq by mouth 2 (two) times daily.   torsemide 20 MG tablet Commonly known as:  DEMADEX Take 60mg  po qam and 40mg  po qpm What changed:    how much to take  how to take this  when to take this  additional instructions   traMADol 50 MG tablet Commonly known as:  ULTRAM Take 50 mg by mouth 3 (three) times daily as needed for pain (Takes one tablet every morning and evening.).       Allergies  Allergen Reactions  . Erythromycin Other (See Comments)    Unknown   . Penicillins Other (See Comments)    Unknown     Consultations:  Cardiology   Procedures/Studies: Dg Chest 2 View  Result Date: 06/23/2017 CLINICAL DATA:  Dyspnea EXAM: CHEST  2 VIEW COMPARISON:  02/27/2013 chest radiograph. FINDINGS: Stable cardiomediastinal silhouette with mild cardiomegaly and aortic atherosclerosis. No pneumothorax. No pleural effusion. Hyperinflated lungs. No overt pulmonary edema. No acute consolidative airspace disease. IMPRESSION: 1. Hyperinflated lungs, suggesting COPD. No acute pulmonary disease. 2. Stable cardiomegaly without overt pulmonary edema. Electronically Signed   By: Delbert Phenix M.D.   On: 06/23/2017 12:05    Echo:Left ventricle: The cavity size was normal. Wall thickness was increased in a pattern of severe LVH. Systolic function was normal. The estimated ejection fraction was in the range of 55% to 60%. Wall motion was normal; there were no regional wall motion abnormalities. Features are consistent with a pseudonormal left ventricular filling pattern, with concomitant abnormal relaxation and increased filling  pressure (grade 2 diastolic dysfunction). Doppler parameters are consistent with high ventricular filling pressure. - Aortic valve: Mildly calcified annulus. Trileaflet; mildly thickened leaflets. Valve area (VTI): 1.76 cm^2. Valve area (Vmax): 2.13 cm^2. - Mitral valve: There was mild regurgitation. - Left atrium: The atrium was severely dilated. - Right ventricle: The cavity size was moderately dilated. TAPSE: 16.6 mm . Lateral annulus peak S velocity: 9.68 cm/s. - Right atrium: The atrium was severely dilated. - Atrial septum: No defect or patent foramen ovale was identified. - Pulmonary arteries: Systolic pressure was moderately increased. PA peak pressure: 53 mm Hg (S).     Subjective: Feeling better.  Shortness of breath is better.  Breathing appears to be approaching baseline.  No chest pain.  Discharge Exam: Vitals:   06/28/17 0804 06/28/17 1000  BP:  (!) 111/53  Pulse:    Resp:  20  Temp:    SpO2: 98% 97%   Vitals:   06/27/17 2109 06/28/17 0555 06/28/17 0804 06/28/17 1000  BP: (!) 100/56 101/60  Marland Kitchen)  111/53  Pulse: 66 64    Resp: 20 20  20   Temp: 98.5 F (36.9 C) 98.2 F (36.8 C)    TempSrc: Oral Oral    SpO2: 95% 94% 98% 97%  Weight:  77.1 kg (169 lb 15.6 oz)    Height:        General: Pt is alert, awake, not in acute distress Cardiovascular: RRR, S1/S2 +, no rubs, no gallops Respiratory: CTA bilaterally, no wheezing, no rhonchi Abdominal: Soft, NT, ND, bowel sounds + Extremities: Trace edema, no cyanosis    The results of significant diagnostics from this hospitalization (including imaging, microbiology, ancillary and laboratory) are listed below for reference.     Microbiology: No results found for this or any previous visit (from the past 240 hour(s)).   Labs: BNP (last 3 results) Recent Labs    06/23/17 1259  BNP 749.0*   Basic Metabolic Panel: Recent Labs  Lab 06/24/17 0320 06/25/17 0426 06/26/17 0655 06/27/17 0455  06/28/17 0524  NA 143 143 144 145 144  K 4.9 5.1 5.4* 4.2 3.9  CL 100* 100* 100* 99* 98*  CO2 34* 34* 36* 35* 37*  GLUCOSE 104* 84 86 89 101*  BUN 55* 52* 46* 40* 36*  CREATININE 1.58* 1.43* 1.31* 1.14* 1.10*  CALCIUM 8.7* 8.9 8.7* 9.0 8.9   Liver Function Tests: No results for input(s): AST, ALT, ALKPHOS, BILITOT, PROT, ALBUMIN in the last 168 hours. No results for input(s): LIPASE, AMYLASE in the last 168 hours. No results for input(s): AMMONIA in the last 168 hours. CBC: Recent Labs  Lab 06/23/17 1259 06/25/17 0426 06/27/17 0455  WBC 5.4 4.9 5.0  NEUTROABS 4.0 2.5  --   HGB 12.9 11.9* 11.8*  HCT 38.6 36.1 37.0  MCV 86.4 87.0 89.6  PLT 46* 107* 90*   Cardiac Enzymes: Recent Labs  Lab 06/23/17 1259 06/23/17 1638 06/23/17 2143 06/24/17 0320  TROPONINI 0.15* 0.17* 0.21* 0.20*   BNP: Invalid input(s): POCBNP CBG: No results for input(s): GLUCAP in the last 168 hours. D-Dimer No results for input(s): DDIMER in the last 72 hours. Hgb A1c No results for input(s): HGBA1C in the last 72 hours. Lipid Profile No results for input(s): CHOL, HDL, LDLCALC, TRIG, CHOLHDL, LDLDIRECT in the last 72 hours. Thyroid function studies No results for input(s): TSH, T4TOTAL, T3FREE, THYROIDAB in the last 72 hours.  Invalid input(s): FREET3 Anemia work up No results for input(s): VITAMINB12, FOLATE, FERRITIN, TIBC, IRON, RETICCTPCT in the last 72 hours. Urinalysis    Component Value Date/Time   COLORURINE YELLOW 02/28/2013 0656   APPEARANCEUR CLEAR 02/28/2013 0656   LABSPEC 1.008 02/28/2013 0656   PHURINE 5.0 02/28/2013 0656   GLUCOSEU NEGATIVE 02/28/2013 0656   HGBUR MODERATE (A) 02/28/2013 0656   BILIRUBINUR NEGATIVE 02/28/2013 0656   KETONESUR NEGATIVE 02/28/2013 0656   PROTEINUR 30 (A) 02/28/2013 0656   UROBILINOGEN 1.0 02/28/2013 0656   NITRITE NEGATIVE 02/28/2013 0656   LEUKOCYTESUR SMALL (A) 02/28/2013 0656   Sepsis Labs Invalid input(s): PROCALCITONIN,  WBC,   LACTICIDVEN Microbiology No results found for this or any previous visit (from the past 240 hour(s)).   Time coordinating discharge: Over 30 minutes  SIGNED:   Erick Blinks, MD  [facility] 06/28/2017, 7:09 PM Pager   If 7PM-7AM, please contact night-coverage [email or web address] Password [email or web address]

## 2017-06-30 ENCOUNTER — Other Ambulatory Visit: Payer: Self-pay

## 2017-06-30 NOTE — Patient Outreach (Signed)
[facility] ([facility] Care Management  06/30/2017  Charlton HawsMary A Lasota 01/26/1930 [id number]     EMMI-HF RED ON EMMI ALERT Day # 1 [facility]: "Scheduled a follow up appt? No"   Outreach attempt # 1 to patient.  Spoke with patient. Reviewed and addressed red alert with patient. [facility]. She was able to recall that she has an appt on 07/13/17 but could not recall when other appts were scheduled for. Patient seemed to get annoyed by questions being asked to confirm that she has appts in place and transportation to them. She voices that her nurse is handling all of this this and then patient ended the call as she reported she needed "to catch her breath."       Plan: RN CM will notify [facility] assistant of case status.    Antionette Fairyoshanda Aizza Santiago, RN,BSN,CCM [facility] Care [facility] Telephonic Care Management Coordinator Direct Phone: [phone] Toll Free: [phone] Fax: [phone]

## 2017-07-01 ENCOUNTER — Other Ambulatory Visit: Payer: Self-pay

## 2017-07-01 NOTE — Patient Outreach (Signed)
[facility] [place]([facility] Care Management  07/01/2017  Brittney Li 12/09/1929 [id number]     EMMI-HF RED ON EMMI ALERT Day # 2 Date: 06/30/17 Red Alert Reason: "New/worsening problems? Yes"   Outreach attempt # 1 to patient. No answer at present.       Plan: RN CM will make outreach attempt to patient within one business day.   [name], RN,BSN,CCM [facility] Care Management Telephonic Care Management Coordinator Direct Phone: [phone] Toll Free: [phone] Fax: [phone]

## 2017-07-02 ENCOUNTER — Other Ambulatory Visit: Payer: Self-pay

## 2017-07-02 NOTE — Patient Outreach (Signed)
[facility] Care Management  07/02/2017  Brittney Li 03/16/1930 [id number]   EMMI-HF RED ON EMMI ALERT Day # 2 Date: 06/30/17 Red Alert Reason: "New/worsening problems? Yes"    Outreach attempt #2 to patient. No answer at present and unable to leave message.     Plan: RN CM will send unsuccessful outreach letter to patient and close case if no response within 10 business days.   Antionette Fairyoshanda Mercy Malena, RN,BSN,CCM [facility] Care Management Telephonic Care Management Coordinator Direct Phone: [phone] Toll Free: [phone] Fax: [phone]

## 2017-07-12 NOTE — Progress Notes (Signed)
Cardiology Office Note    Date:  07/13/2017   ID:  Brittney Li, DOB 1930-03-15, MRN [id number]  PCP:  Avon Gully, MD  Cardiologist: Dr. Wyline Mood CHF: Dr. Shirlee Latch   Chief Complaint  Patient presents with  . Hospitalization Follow-up    History of Present Illness:    Brittney Li is a 84 y.o. female with past medical history of chronic diastolic CHF, COPD (on 3L [place] at baseline), HTN, HLD, and Type 2 DM who presents to the office today for hospital follow-up.   She was recently admitted to [facility] on 06/23/2017 for worsening dyspnea on exertion and hypoxia.  Had also noticed worsening lower extremity edema and orthopnea. BNP was found to be elevated to 749 and she was admitted for an acute CHF exacerbation. Cyclic troponin values were flat and peaked at 0.21. A repeat echocardiogram was obtained and showed a preserved EF of 55-60%, no regional wall motion abnormalities, Grade 2 DD, and mild MR.  She diuresed well with IV Lasix and was overall -4.0 L since admission. She was started on torsemide 60 mg in AM/40 mg in PM at the time of discharge. Weight was 169 lbs with creatinine improved to 1.10 (had been elevated to 1.64 on admission).   In talking with the patient today, she reports overall doing well since her recent hospitalization. She notes that breathing has been at baseline and denies any acute worsening of her respiratory status. No recent orthopnea, PND, or lower extremity edema. Weight has continued to decline on her home scales and is at 158 lbs during today's visit.  BP is soft at 94/54, but she denies any symptoms with this. Reports SBP has been in the low-100's when checked at home. Reports good compliance with her medication regimen. She is accompanied by her caregiver who questions if she should be on Benzapril or Lisinopril as she was started on Benzapril during her recent admission with Lisinopril being discontinued.   Past Medical History:  Diagnosis Date  .  Arthritis   . Asthma   . Bronchitis   . CHF (congestive heart failure) (HCC)    a. ECHO (01/2013) EF 60-65%, grade II diastolic dysfx, LA mildly dilated  . COPD (chronic obstructive pulmonary disease) (HCC)   . Diabetes mellitus without complication (HCC)   . Hypertension     Past Surgical History:  Procedure Laterality Date  . ABDOMINAL HYSTERECTOMY  1975  . hands      Current Medications: Outpatient Medications Prior to Visit  Medication Sig Dispense Refill  . albuterol (PROVENTIL HFA;VENTOLIN HFA) 108 (90 BASE) MCG/ACT inhaler Inhale 2 puffs into the lungs every 6 (six) hours as needed for wheezing.    Marland Kitchen albuterol (PROVENTIL) (2.5 MG/3ML) 0.083% nebulizer solution Take 2.5 mg by nebulization every 6 (six) hours as needed for wheezing.    Marland Kitchen aspirin 81 MG tablet Take 81 mg by mouth daily.      Marland Kitchen atorvastatin (LIPITOR) 20 MG tablet Take 20 mg by mouth daily at 6 PM.    . docusate sodium (COLACE) 100 MG capsule Take 200 mg by mouth at bedtime.    . donepezil (ARICEPT) 10 MG tablet Take 10 mg by mouth at bedtime.    . famotidine (PEPCID) 20 MG tablet Take 20 mg by mouth daily.    . mirtazapine (REMERON) 30 MG tablet Take 30 mg by mouth at bedtime.    . mometasone-formoterol (DULERA) 200-5 MCG/ACT AERO Inhale 2 puffs into the lungs 2 (  two) times daily. 1 Inhaler 3  . Multiple Vitamin (MULTIVITAMIN WITH MINERALS) TABS tablet Take 1 tablet by mouth daily.    Marland Kitchen oxybutynin (DITROPAN) 5 MG tablet Take 5 mg by mouth daily.    . potassium chloride (KLOR-CON) 20 MEQ packet Take 60 mEq by mouth 2 (two) times daily. 180 tablet 0  . torsemide (DEMADEX) 20 MG tablet Take 60mg  po qam and 40mg  po qpm 150 tablet 0  . traMADol (ULTRAM) 50 MG tablet Take 50 mg by mouth 3 (three) times daily as needed for pain (Takes one tablet every morning and evening.).    Marland Kitchen benazepril (LOTENSIN) 5 MG tablet Take 2 tablets (10 mg total) by mouth daily. 30 tablet 0   No facility-administered medications prior to  visit.      Allergies:   Erythromycin and Penicillins   Social History   Socioeconomic History  . Marital status: Divorced    Spouse name: None  . Number of children: None  . Years of education: 12th grade  . Highest education level: None  Social Needs  . Financial resource strain: None  . Food insecurity - worry: None  . Food insecurity - inability: None  . Transportation needs - medical: None  . Transportation needs - non-medical: None  Occupational History  . Occupation: retired    Associate Professor: RETIRED  Tobacco Use  . Smoking status: Former Games developer  . Smokeless tobacco: Never Used  . Tobacco comment: quit smoking 1990  Substance and Sexual Activity  . Alcohol use: No  . Drug use: No  . Sexual activity: None  Other Topics Concern  . None  Social History Narrative  . None     Family History:  The patient's family history includes Arthritis in her unknown relative; Asthma in her unknown relative; Diabetes in her unknown relative.   Review of Systems:   Please see the history of present illness.     General:  No chills, fever, night sweats or weight changes.  Cardiovascular:  No chest pain, edema, orthopnea, palpitations, paroxysmal nocturnal dyspnea. Positive for dyspnea on exertion (at baseline). Dermatological: No rash, lesions/masses Respiratory: No cough, dyspnea Urologic: No hematuria, dysuria Abdominal:   No nausea, vomiting, diarrhea, bright red blood per rectum, melena, or hematemesis Neurologic:  No visual changes, wkns, changes in mental status. All other systems reviewed and are otherwise negative except as noted above.   Physical Exam:    VS:  BP (!) 94/54   Pulse 64   Ht 5\' 3"  (1.6 m)   Wt 158 lb (71.7 kg)   BMI 27.99 kg/m    General: Well developed, elderly African American female appearing in no acute distress. Head: Normocephalic, atraumatic, sclera non-icteric, no xanthomas, nares are without discharge.  Neck: No carotid bruits. JVD not  elevated.  Lungs: Respirations regular and unlabored, without wheezes or rales.  Heart: Regular rate and rhythm. No S3 or S4.  No rubs or gallops appreciated. 2/6 SEM along [place]. Abdomen: Soft, non-tender, non-distended with normoactive bowel sounds. No hepatomegaly. No rebound/guarding. No obvious abdominal masses. Msk:  Strength and tone appear normal for age. No joint deformities or effusions. Extremities: No clubbing or cyanosis. No lower extremity edema.  Distal pedal pulses are 2+ bilaterally. Neuro: Alert and oriented X 3. Moves all extremities spontaneously. No focal deficits noted. Psych:  Responds to questions appropriately with a normal affect. Skin: No rashes or lesions noted  Wt Readings from Last 3 Encounters:  07/13/17 158 lb (71.7 kg)  06/28/17 169 lb 15.6 oz (77.1 kg)  07/21/16 180 lb (81.6 kg)      Studies/Labs Reviewed:   EKG:  EKG is not ordered today.   Recent Labs: 06/23/2017: B Natriuretic Peptide 749.0 06/27/2017: Hemoglobin 11.8; Platelets 90 07/13/2017: BUN 47; Creatinine, Ser 1.70; Potassium 5.8; Sodium 140   Lipid Panel    Component Value Date/Time   CHOL 131 09/23/2015 1223   TRIG 80 09/23/2015 1223   HDL 75 09/23/2015 1223   CHOLHDL 1.7 09/23/2015 1223   VLDL 16 09/23/2015 1223   LDLCALC 40 09/23/2015 1223    Additional studies/ records that were reviewed today include:   Echocardiogram: 06/24/2017 Study Conclusions  - Left ventricle: The cavity size was normal. Wall thickness was   increased in a pattern of severe LVH. Systolic function was   normal. The estimated ejection fraction was in the range of 55%   to 60%. Wall motion was normal; there were no regional wall   motion abnormalities. Features are consistent with a pseudonormal   left ventricular filling pattern, with concomitant abnormal   relaxation and increased filling pressure (grade 2 diastolic   dysfunction). Doppler parameters are consistent with high   ventricular filling  pressure. - Aortic valve: Mildly calcified annulus. Trileaflet; mildly   thickened leaflets. Valve area (VTI): 1.76 cm^2. Valve area   (Vmax): 2.13 cm^2. - Mitral valve: There was mild regurgitation. - Left atrium: The atrium was severely dilated. - Right ventricle: The cavity size was moderately dilated. TAPSE:   16.6 mm . Lateral annulus peak S velocity: 9.68 cm/s. - Right atrium: The atrium was severely dilated. - Atrial septum: No defect or patent foramen ovale was identified. - Pulmonary arteries: Systolic pressure was moderately increased.   PA peak pressure: 53 mm Hg (S).  Assessment:    1. Chronic diastolic heart failure (HCC)   2. Chronic obstructive pulmonary disease, unspecified COPD type (HCC)   3. Essential hypertension   4. Hyperlipidemia LDL goal <100   5. CKD (chronic kidney disease) stage 3, GFR 30-59 ml/min (HCC)   6. Medication management      Plan:   In order of problems listed above:  1. Chronic Diastolic CHF - recently admitted for worsening dyspnea on exertion and hypoxia, with BNP elevated to 749. Echo showed a preserved EF of 55-60%, no regional wall motion abnormalities, Grade 2 DD, and mild MR. She diuresed well with IV Lasix and was overall -4.0 L since admission and was transitioned to Torsemide 60 mg in AM/40 mg in PM at the time of discharge.  - she denies any recurrent dyspnea, orthopnea, PND, or lower extremity edema. Weight was 169 lbs at the time of discharge and is at 158 lbs today. Will recheck BMET today to assess creatinine and K+ levels. If kidney function is trending upwards, will need to reduce Torsemide dosing.   2. COPD - remains on 3L [place] at baseline. Followed by Pulmonology.   3. HTN - BP is soft at 94/54 during today's visit.  - unclear why Lisinopril was switched to Benazepril during her recent hospitalization (perhaps secondary to Pharmacy formulary). She has been tolerating Benazepril well and says her co-pay is the same for both.  Will therefore continue with Benazepril at this time but reduce dosing from 10mg  daily to 5mg  daily. I have asked her caregiver to follow her BP at home and to report back if SBP remains < 100.  4. HLD - Lipid Panel in 08/2015 showed total cholesterol  of 131, HDL 75, and LDL 40. - remains on Atorvastatin 20mg  daily.   5. Stage 3 CKD -  Creatinine elevated to 1.64 at the time of recent admission, improved to 1.10 at discharge. Recheck BMET today.   Medication Adjustments/Labs and Tests Ordered: Current medicines are reviewed at length with the patient today.  Concerns regarding medicines are outlined above.  Medication changes, Labs and Tests ordered today are listed in the Patient Instructions below. Patient Instructions  Medication Instructions:  Your physician has recommended you make the following change in your medication:  Decrease Benazepril to 5 mg Daily    Labwork: Your physician recommends that you return for lab work today.   Testing/Procedures: NONE   Follow-Up: Your physician recommends that you schedule a follow-up appointment in: 2-3 Months with Dr. Wyline Mood   Any Other Special Instructions Will Be Listed Below (If Applicable).  If you need a refill on your cardiac medications before your next appointment, please call your pharmacy.  Thank you for choosing [facility]!    Signed, [name], PA-C  07/13/2017 4:46 PM    [facility] [address] [place], [place] [address] Phone: [phone]

## 2017-07-13 ENCOUNTER — Ambulatory Visit (INDEPENDENT_AMBULATORY_CARE_PROVIDER_SITE_OTHER): Payer: Medicare Other | Admitting: Student

## 2017-07-13 ENCOUNTER — Other Ambulatory Visit (HOSPITAL_COMMUNITY)
Admission: RE | Admit: 2017-07-13 | Discharge: 2017-07-13 | Disposition: A | Discharge status: Home / Self Care | Payer: Medicare Other | Source: Ambulatory Visit | Attending: Student | Admitting: Student

## 2017-07-13 ENCOUNTER — Telehealth: Payer: Self-pay | Admitting: *Deleted

## 2017-07-13 ENCOUNTER — Encounter: Payer: Self-pay | Admitting: Student

## 2017-07-13 VITALS — BP 94/54 | HR 64 | Ht 63.0 in | Wt 158.0 lb

## 2017-07-13 DIAGNOSIS — N183 Chronic kidney disease, stage 3 unspecified: Secondary | ICD-10-CM

## 2017-07-13 DIAGNOSIS — Z79899 Other long term (current) drug therapy: Secondary | ICD-10-CM | POA: Insufficient documentation

## 2017-07-13 DIAGNOSIS — E785 Hyperlipidemia, unspecified: Secondary | ICD-10-CM

## 2017-07-13 DIAGNOSIS — J449 Chronic obstructive pulmonary disease, unspecified: Secondary | ICD-10-CM

## 2017-07-13 DIAGNOSIS — I5032 Chronic diastolic (congestive) heart failure: Secondary | ICD-10-CM

## 2017-07-13 DIAGNOSIS — I10 Essential (primary) hypertension: Secondary | ICD-10-CM | POA: Diagnosis not present

## 2017-07-13 DIAGNOSIS — E875 Hyperkalemia: Secondary | ICD-10-CM

## 2017-07-13 LAB — BASIC METABOLIC PANEL
Anion gap: 10 (ref 5–15)
BUN: 47 mg/dL — AB (ref 6–20)
CHLORIDE: 96 mmol/L — AB (ref 101–111)
CO2: 34 mmol/L — AB (ref 22–32)
Calcium: 9.3 mg/dL (ref 8.9–10.3)
Creatinine, Ser: 1.7 mg/dL — ABNORMAL HIGH (ref 0.44–1.00)
GFR calc Af Amer: 30 mL/min — ABNORMAL LOW (ref >60)
GFR calc non Af Amer: 26 mL/min — ABNORMAL LOW (ref >60)
GLUCOSE: 101 mg/dL — AB (ref 65–99)
Potassium: 5.8 mmol/L — ABNORMAL HIGH (ref 3.5–5.1)
SODIUM: 140 mmol/L (ref 135–145)

## 2017-07-13 MED ORDER — TORSEMIDE 20 MG PO TABS: ORAL_TABLET | ORAL | 3 refills | Status: DC

## 2017-07-13 MED ORDER — BENAZEPRIL HCL 5 MG PO TABS: 5.0000 mg | ORAL_TABLET | Freq: Every day | ORAL | 3 refills | Status: DC

## 2017-07-13 MED ORDER — POTASSIUM CHLORIDE CRYS ER 20 MEQ PO TBCR: 60.0000 meq | EXTENDED_RELEASE_TABLET | Freq: Every day | ORAL | 6 refills | Status: DC

## 2017-07-13 NOTE — Telephone Encounter (Signed)
-----   Message from Ellsworth LennoxBrittany M Strader, [place] JerseyPA-C sent at 07/13/2017  4:44 PM EST ----- Please let the patient know her kidney function has acutely worsened with creatinine going from 1.10 to 1.70 and K+ is also elevated. Would reduce Torsemide to 40mg  in AM/20mg  in PM and hold Potassium supplementation for 2 days. Resume K-dur on 07/16/2017 but only at 60 mEq once daily. Will need a repeat BMET in one week. The patient requested results be called to her caregiver Brittney Li([name] [phone]). Thank you.

## 2017-07-13 NOTE — Patient Instructions (Signed)
Medication Instructions:  Your physician has recommended you make the following change in your medication:  Decrease Benazepril to 5 mg Daily    Labwork: Your physician recommends that you return for lab work today.   Testing/Procedures: NONE   Follow-Up: Your physician recommends that you schedule a follow-up appointment in: 2-3 Months with Dr. Wyline MoodBranch    Any Other Special Instructions Will Be Listed Below (If Applicable).     If you need a refill on your cardiac medications before your next appointment, please call your pharmacy.  Thank you for choosing [facility]!

## 2017-07-16 ENCOUNTER — Other Ambulatory Visit: Payer: Self-pay

## 2017-07-16 NOTE — Patient Outreach (Signed)
[facility]) Care Management  07/16/2017  Brittney Li 07/22/1929 [id number]   EMMI-HF RED ON EMMI ALERT Day #2 Date:06/30/17 Red Alert Reason:"New/worsening problems? Yes"    Multiple attempts to establish contact with patient without success. No response from letter mailed to patient. Case is being closed at this time.    Plan: RN CM will notify [facility] administrative assistant of case status.   [name], RN,BSN,CCM [facility] Care Management Telephonic Care Management Coordinator Direct Phone: [phone] Toll Free: [phone] Fax: [phone]

## 2017-07-21 ENCOUNTER — Other Ambulatory Visit (HOSPITAL_COMMUNITY)
Admission: RE | Admit: 2017-07-21 | Discharge: 2017-07-21 | Disposition: A | Discharge status: Home / Self Care | Payer: Medicare Other | Source: Ambulatory Visit | Attending: Student | Admitting: Student

## 2017-07-21 DIAGNOSIS — J449 Chronic obstructive pulmonary disease, unspecified: Secondary | ICD-10-CM | POA: Diagnosis not present

## 2017-07-21 DIAGNOSIS — Z79899 Other long term (current) drug therapy: Secondary | ICD-10-CM | POA: Insufficient documentation

## 2017-07-21 DIAGNOSIS — N183 Chronic kidney disease, stage 3 (moderate): Secondary | ICD-10-CM | POA: Insufficient documentation

## 2017-07-21 DIAGNOSIS — I5032 Chronic diastolic (congestive) heart failure: Secondary | ICD-10-CM | POA: Diagnosis not present

## 2017-07-21 DIAGNOSIS — I10 Essential (primary) hypertension: Secondary | ICD-10-CM | POA: Diagnosis not present

## 2017-07-21 LAB — BASIC METABOLIC PANEL
ANION GAP: 12 (ref 5–15)
BUN: 50 mg/dL — ABNORMAL HIGH (ref 6–20)
CO2: 35 mmol/L — ABNORMAL HIGH (ref 22–32)
Calcium: 9.4 mg/dL (ref 8.9–10.3)
Chloride: 94 mmol/L — ABNORMAL LOW (ref 101–111)
Creatinine, Ser: 1.69 mg/dL — ABNORMAL HIGH (ref 0.44–1.00)
GFR calc Af Amer: 31 mL/min — ABNORMAL LOW (ref >60)
GFR, EST NON AFRICAN AMERICAN: 26 mL/min — AB (ref >60)
GLUCOSE: 109 mg/dL — AB (ref 65–99)
POTASSIUM: 4.6 mmol/L (ref 3.5–5.1)
Sodium: 141 mmol/L (ref 135–145)

## 2017-08-03 ENCOUNTER — Other Ambulatory Visit: Payer: Self-pay

## 2017-08-03 DIAGNOSIS — I5032 Chronic diastolic (congestive) heart failure: Secondary | ICD-10-CM | POA: Diagnosis not present

## 2017-08-03 NOTE — Patient Outreach (Signed)
[facility]) Care Management  08/03/2017  Brittney Li 03/24/1930 [id number]    EMMI- Heart Failure RED ON EMMI ALERT Day # 35 Date: 08/02/17 Red Alert Reason: New/worsening problems? Yes  Telephone call to patient for EMMI follow up.  No answer.  HIPAA compliant voice message left.  Plan: RN CM will send letter and attempt patient again within 4 business days.     Brittney Lericheionne J Eraina Winnie, RN, MSN [facility] Care Management Care Management Coordinator Direct Line [phone] Toll Free: [phone]  Fax: [phone]

## 2017-08-04 ENCOUNTER — Other Ambulatory Visit: Payer: Self-pay

## 2017-08-04 NOTE — Patient Outreach (Signed)
[facility]) Care Management  08/04/2017  Brittney Li 12/29/1929 [id number]   EMMI- Heart Failure RED ON EMMI ALERT Day # 35 Date:08/02/17 Red Alert Reason: New/worsening problems? Yes  2nd second telephone call to patient.  No answer.  HIPAA compliant voice message left.  Plan: RN CM will wait patient return call.  If no return call within 10 days will close case.    Bary Lericheionne J Platon Arocho, RN, MSN [facility] Care Management Care Management Coordinator Direct Line [phone] Toll Free: [phone]  Fax: [phone]

## 2017-08-10 ENCOUNTER — Other Ambulatory Visit: Payer: Self-pay

## 2017-08-10 NOTE — Patient Outreach (Signed)
Patient triggered Red on EMMI Heart Failure Dashboard, notification sent to Fleeta Emmerionne Leath, RN

## 2017-08-10 NOTE — Patient Outreach (Signed)
[facility]) Care Management  08/10/2017  Brittney HawsMary A Li 09/15/1929 [id number]   EMMI- Heart Failure RED ON EMMI ALERT Day # 42 Date: 08/09/17  Red Alert Reason: Weighed themselves today? No  Telephone call to patient for EMMI red alert.  No answer.  HIPAA compliant voice message left.  Plan: RN CM will wait patient return call.  Bary Lericheionne J Utah Delauder, RN, MSN [facility] Care Management Care Management Coordinator Direct Line [phone] Toll Free: [phone]  Fax: [phone]

## 2017-08-11 DIAGNOSIS — F29 Unspecified psychosis not due to a substance or known physiological condition: Secondary | ICD-10-CM | POA: Diagnosis not present

## 2017-08-11 DIAGNOSIS — M6281 Muscle weakness (generalized): Secondary | ICD-10-CM | POA: Diagnosis not present

## 2017-08-11 DIAGNOSIS — I509 Heart failure, unspecified: Secondary | ICD-10-CM | POA: Diagnosis not present

## 2017-08-11 DIAGNOSIS — R262 Difficulty in walking, not elsewhere classified: Secondary | ICD-10-CM | POA: Diagnosis not present

## 2017-08-11 DIAGNOSIS — J449 Chronic obstructive pulmonary disease, unspecified: Secondary | ICD-10-CM | POA: Diagnosis not present

## 2017-08-11 DIAGNOSIS — R32 Unspecified urinary incontinence: Secondary | ICD-10-CM | POA: Diagnosis not present

## 2017-08-11 DIAGNOSIS — N183 Chronic kidney disease, stage 3 (moderate): Secondary | ICD-10-CM | POA: Diagnosis not present

## 2017-08-11 DIAGNOSIS — I5031 Acute diastolic (congestive) heart failure: Secondary | ICD-10-CM | POA: Diagnosis not present

## 2017-08-12 DIAGNOSIS — M6281 Muscle weakness (generalized): Secondary | ICD-10-CM | POA: Diagnosis not present

## 2017-08-12 DIAGNOSIS — R262 Difficulty in walking, not elsewhere classified: Secondary | ICD-10-CM | POA: Diagnosis not present

## 2017-08-12 DIAGNOSIS — I5031 Acute diastolic (congestive) heart failure: Secondary | ICD-10-CM | POA: Diagnosis not present

## 2017-08-12 DIAGNOSIS — I509 Heart failure, unspecified: Secondary | ICD-10-CM | POA: Diagnosis not present

## 2017-08-12 DIAGNOSIS — N183 Chronic kidney disease, stage 3 (moderate): Secondary | ICD-10-CM | POA: Diagnosis not present

## 2017-08-12 DIAGNOSIS — J449 Chronic obstructive pulmonary disease, unspecified: Secondary | ICD-10-CM | POA: Diagnosis not present

## 2017-08-13 DIAGNOSIS — I5031 Acute diastolic (congestive) heart failure: Secondary | ICD-10-CM | POA: Diagnosis not present

## 2017-08-13 DIAGNOSIS — J449 Chronic obstructive pulmonary disease, unspecified: Secondary | ICD-10-CM | POA: Diagnosis not present

## 2017-08-13 DIAGNOSIS — R262 Difficulty in walking, not elsewhere classified: Secondary | ICD-10-CM | POA: Diagnosis not present

## 2017-08-13 DIAGNOSIS — I509 Heart failure, unspecified: Secondary | ICD-10-CM | POA: Diagnosis not present

## 2017-08-13 DIAGNOSIS — M6281 Muscle weakness (generalized): Secondary | ICD-10-CM | POA: Diagnosis not present

## 2017-08-13 DIAGNOSIS — N183 Chronic kidney disease, stage 3 (moderate): Secondary | ICD-10-CM | POA: Diagnosis not present

## 2017-08-17 ENCOUNTER — Other Ambulatory Visit: Payer: Self-pay

## 2017-08-17 DIAGNOSIS — J449 Chronic obstructive pulmonary disease, unspecified: Secondary | ICD-10-CM | POA: Diagnosis not present

## 2017-08-17 DIAGNOSIS — I5032 Chronic diastolic (congestive) heart failure: Secondary | ICD-10-CM | POA: Diagnosis not present

## 2017-08-17 DIAGNOSIS — G301 Alzheimer's disease with late onset: Secondary | ICD-10-CM | POA: Diagnosis not present

## 2017-08-17 DIAGNOSIS — Z1389 Encounter for screening for other disorder: Secondary | ICD-10-CM | POA: Diagnosis not present

## 2017-08-17 DIAGNOSIS — Z1331 Encounter for screening for depression: Secondary | ICD-10-CM | POA: Diagnosis not present

## 2017-08-17 DIAGNOSIS — M170 Bilateral primary osteoarthritis of knee: Secondary | ICD-10-CM | POA: Diagnosis not present

## 2017-08-17 NOTE — Patient Outreach (Signed)
[facility]) Care Management  08/17/2017  Brittney Li 04/04/1930 [id number]   Multiple attempts to establish contact with patient without success. No response from letter mailed to patient. Case is being closed at this time.   Plan: RN CM will notify care management assistant of case status.  Bary Lericheionne J Lissa Rowles, RN, MSN [facility] Care Management Care Management Coordinator Direct Line [phone] Toll Free: [phone]  Fax: [phone]

## 2017-08-18 DIAGNOSIS — I5031 Acute diastolic (congestive) heart failure: Secondary | ICD-10-CM | POA: Diagnosis not present

## 2017-08-18 DIAGNOSIS — M6281 Muscle weakness (generalized): Secondary | ICD-10-CM | POA: Diagnosis not present

## 2017-08-18 DIAGNOSIS — J449 Chronic obstructive pulmonary disease, unspecified: Secondary | ICD-10-CM | POA: Diagnosis not present

## 2017-08-18 DIAGNOSIS — I509 Heart failure, unspecified: Secondary | ICD-10-CM | POA: Diagnosis not present

## 2017-08-18 DIAGNOSIS — R262 Difficulty in walking, not elsewhere classified: Secondary | ICD-10-CM | POA: Diagnosis not present

## 2017-08-18 DIAGNOSIS — N183 Chronic kidney disease, stage 3 (moderate): Secondary | ICD-10-CM | POA: Diagnosis not present

## 2017-08-20 DIAGNOSIS — J449 Chronic obstructive pulmonary disease, unspecified: Secondary | ICD-10-CM | POA: Diagnosis not present

## 2017-08-20 DIAGNOSIS — I5031 Acute diastolic (congestive) heart failure: Secondary | ICD-10-CM | POA: Diagnosis not present

## 2017-08-20 DIAGNOSIS — I509 Heart failure, unspecified: Secondary | ICD-10-CM | POA: Diagnosis not present

## 2017-08-20 DIAGNOSIS — N183 Chronic kidney disease, stage 3 (moderate): Secondary | ICD-10-CM | POA: Diagnosis not present

## 2017-08-20 DIAGNOSIS — M6281 Muscle weakness (generalized): Secondary | ICD-10-CM | POA: Diagnosis not present

## 2017-08-20 DIAGNOSIS — R262 Difficulty in walking, not elsewhere classified: Secondary | ICD-10-CM | POA: Diagnosis not present

## 2017-08-23 DIAGNOSIS — I5031 Acute diastolic (congestive) heart failure: Secondary | ICD-10-CM | POA: Diagnosis not present

## 2017-08-23 DIAGNOSIS — N183 Chronic kidney disease, stage 3 (moderate): Secondary | ICD-10-CM | POA: Diagnosis not present

## 2017-08-23 DIAGNOSIS — I509 Heart failure, unspecified: Secondary | ICD-10-CM | POA: Diagnosis not present

## 2017-08-23 DIAGNOSIS — R262 Difficulty in walking, not elsewhere classified: Secondary | ICD-10-CM | POA: Diagnosis not present

## 2017-08-23 DIAGNOSIS — M6281 Muscle weakness (generalized): Secondary | ICD-10-CM | POA: Diagnosis not present

## 2017-08-23 DIAGNOSIS — J449 Chronic obstructive pulmonary disease, unspecified: Secondary | ICD-10-CM | POA: Diagnosis not present

## 2017-08-25 DIAGNOSIS — R262 Difficulty in walking, not elsewhere classified: Secondary | ICD-10-CM | POA: Diagnosis not present

## 2017-08-25 DIAGNOSIS — I5031 Acute diastolic (congestive) heart failure: Secondary | ICD-10-CM | POA: Diagnosis not present

## 2017-08-25 DIAGNOSIS — J449 Chronic obstructive pulmonary disease, unspecified: Secondary | ICD-10-CM | POA: Diagnosis not present

## 2017-08-25 DIAGNOSIS — N183 Chronic kidney disease, stage 3 (moderate): Secondary | ICD-10-CM | POA: Diagnosis not present

## 2017-08-25 DIAGNOSIS — M6281 Muscle weakness (generalized): Secondary | ICD-10-CM | POA: Diagnosis not present

## 2017-08-25 DIAGNOSIS — I509 Heart failure, unspecified: Secondary | ICD-10-CM | POA: Diagnosis not present

## 2017-08-27 DIAGNOSIS — I509 Heart failure, unspecified: Secondary | ICD-10-CM | POA: Diagnosis not present

## 2017-08-27 DIAGNOSIS — J449 Chronic obstructive pulmonary disease, unspecified: Secondary | ICD-10-CM | POA: Diagnosis not present

## 2017-08-27 DIAGNOSIS — R262 Difficulty in walking, not elsewhere classified: Secondary | ICD-10-CM | POA: Diagnosis not present

## 2017-08-27 DIAGNOSIS — N183 Chronic kidney disease, stage 3 (moderate): Secondary | ICD-10-CM | POA: Diagnosis not present

## 2017-08-27 DIAGNOSIS — I5031 Acute diastolic (congestive) heart failure: Secondary | ICD-10-CM | POA: Diagnosis not present

## 2017-08-27 DIAGNOSIS — M6281 Muscle weakness (generalized): Secondary | ICD-10-CM | POA: Diagnosis not present

## 2017-08-30 DIAGNOSIS — N183 Chronic kidney disease, stage 3 (moderate): Secondary | ICD-10-CM | POA: Diagnosis not present

## 2017-08-30 DIAGNOSIS — I509 Heart failure, unspecified: Secondary | ICD-10-CM | POA: Diagnosis not present

## 2017-08-30 DIAGNOSIS — R262 Difficulty in walking, not elsewhere classified: Secondary | ICD-10-CM | POA: Diagnosis not present

## 2017-08-30 DIAGNOSIS — I5031 Acute diastolic (congestive) heart failure: Secondary | ICD-10-CM | POA: Diagnosis not present

## 2017-08-30 DIAGNOSIS — M6281 Muscle weakness (generalized): Secondary | ICD-10-CM | POA: Diagnosis not present

## 2017-08-30 DIAGNOSIS — J449 Chronic obstructive pulmonary disease, unspecified: Secondary | ICD-10-CM | POA: Diagnosis not present

## 2017-09-01 DIAGNOSIS — M6281 Muscle weakness (generalized): Secondary | ICD-10-CM | POA: Diagnosis not present

## 2017-09-01 DIAGNOSIS — J449 Chronic obstructive pulmonary disease, unspecified: Secondary | ICD-10-CM | POA: Diagnosis not present

## 2017-09-01 DIAGNOSIS — I509 Heart failure, unspecified: Secondary | ICD-10-CM | POA: Diagnosis not present

## 2017-09-01 DIAGNOSIS — I5031 Acute diastolic (congestive) heart failure: Secondary | ICD-10-CM | POA: Diagnosis not present

## 2017-09-01 DIAGNOSIS — R262 Difficulty in walking, not elsewhere classified: Secondary | ICD-10-CM | POA: Diagnosis not present

## 2017-09-01 DIAGNOSIS — N183 Chronic kidney disease, stage 3 (moderate): Secondary | ICD-10-CM | POA: Diagnosis not present

## 2017-09-06 DIAGNOSIS — J449 Chronic obstructive pulmonary disease, unspecified: Secondary | ICD-10-CM | POA: Diagnosis not present

## 2017-09-06 DIAGNOSIS — I5031 Acute diastolic (congestive) heart failure: Secondary | ICD-10-CM | POA: Diagnosis not present

## 2017-09-06 DIAGNOSIS — M6281 Muscle weakness (generalized): Secondary | ICD-10-CM | POA: Diagnosis not present

## 2017-09-06 DIAGNOSIS — I509 Heart failure, unspecified: Secondary | ICD-10-CM | POA: Diagnosis not present

## 2017-09-06 DIAGNOSIS — R262 Difficulty in walking, not elsewhere classified: Secondary | ICD-10-CM | POA: Diagnosis not present

## 2017-09-06 DIAGNOSIS — N183 Chronic kidney disease, stage 3 (moderate): Secondary | ICD-10-CM | POA: Diagnosis not present

## 2017-09-08 DIAGNOSIS — J449 Chronic obstructive pulmonary disease, unspecified: Secondary | ICD-10-CM | POA: Diagnosis not present

## 2017-09-08 DIAGNOSIS — R262 Difficulty in walking, not elsewhere classified: Secondary | ICD-10-CM | POA: Diagnosis not present

## 2017-09-08 DIAGNOSIS — M6281 Muscle weakness (generalized): Secondary | ICD-10-CM | POA: Diagnosis not present

## 2017-09-08 DIAGNOSIS — I509 Heart failure, unspecified: Secondary | ICD-10-CM | POA: Diagnosis not present

## 2017-09-08 DIAGNOSIS — I5031 Acute diastolic (congestive) heart failure: Secondary | ICD-10-CM | POA: Diagnosis not present

## 2017-09-08 DIAGNOSIS — N183 Chronic kidney disease, stage 3 (moderate): Secondary | ICD-10-CM | POA: Diagnosis not present

## 2017-09-10 DIAGNOSIS — I5031 Acute diastolic (congestive) heart failure: Secondary | ICD-10-CM | POA: Diagnosis not present

## 2017-09-10 DIAGNOSIS — N183 Chronic kidney disease, stage 3 (moderate): Secondary | ICD-10-CM | POA: Diagnosis not present

## 2017-09-10 DIAGNOSIS — R262 Difficulty in walking, not elsewhere classified: Secondary | ICD-10-CM | POA: Diagnosis not present

## 2017-09-10 DIAGNOSIS — I509 Heart failure, unspecified: Secondary | ICD-10-CM | POA: Diagnosis not present

## 2017-09-10 DIAGNOSIS — M6281 Muscle weakness (generalized): Secondary | ICD-10-CM | POA: Diagnosis not present

## 2017-09-10 DIAGNOSIS — J449 Chronic obstructive pulmonary disease, unspecified: Secondary | ICD-10-CM | POA: Diagnosis not present

## 2017-09-13 DIAGNOSIS — I5031 Acute diastolic (congestive) heart failure: Secondary | ICD-10-CM | POA: Diagnosis not present

## 2017-09-13 DIAGNOSIS — J449 Chronic obstructive pulmonary disease, unspecified: Secondary | ICD-10-CM | POA: Diagnosis not present

## 2017-09-13 DIAGNOSIS — R262 Difficulty in walking, not elsewhere classified: Secondary | ICD-10-CM | POA: Diagnosis not present

## 2017-09-13 DIAGNOSIS — I509 Heart failure, unspecified: Secondary | ICD-10-CM | POA: Diagnosis not present

## 2017-09-13 DIAGNOSIS — N183 Chronic kidney disease, stage 3 (moderate): Secondary | ICD-10-CM | POA: Diagnosis not present

## 2017-09-13 DIAGNOSIS — M6281 Muscle weakness (generalized): Secondary | ICD-10-CM | POA: Diagnosis not present

## 2017-09-15 DIAGNOSIS — I509 Heart failure, unspecified: Secondary | ICD-10-CM | POA: Diagnosis not present

## 2017-09-15 DIAGNOSIS — N183 Chronic kidney disease, stage 3 (moderate): Secondary | ICD-10-CM | POA: Diagnosis not present

## 2017-09-15 DIAGNOSIS — M6281 Muscle weakness (generalized): Secondary | ICD-10-CM | POA: Diagnosis not present

## 2017-09-15 DIAGNOSIS — R262 Difficulty in walking, not elsewhere classified: Secondary | ICD-10-CM | POA: Diagnosis not present

## 2017-09-15 DIAGNOSIS — I5031 Acute diastolic (congestive) heart failure: Secondary | ICD-10-CM | POA: Diagnosis not present

## 2017-09-15 DIAGNOSIS — J449 Chronic obstructive pulmonary disease, unspecified: Secondary | ICD-10-CM | POA: Diagnosis not present

## 2017-09-20 DIAGNOSIS — I5031 Acute diastolic (congestive) heart failure: Secondary | ICD-10-CM | POA: Diagnosis not present

## 2017-09-20 DIAGNOSIS — I509 Heart failure, unspecified: Secondary | ICD-10-CM | POA: Diagnosis not present

## 2017-09-20 DIAGNOSIS — J449 Chronic obstructive pulmonary disease, unspecified: Secondary | ICD-10-CM | POA: Diagnosis not present

## 2017-09-20 DIAGNOSIS — N183 Chronic kidney disease, stage 3 (moderate): Secondary | ICD-10-CM | POA: Diagnosis not present

## 2017-09-20 DIAGNOSIS — M6281 Muscle weakness (generalized): Secondary | ICD-10-CM | POA: Diagnosis not present

## 2017-09-20 DIAGNOSIS — R262 Difficulty in walking, not elsewhere classified: Secondary | ICD-10-CM | POA: Diagnosis not present

## 2017-09-23 DIAGNOSIS — M6281 Muscle weakness (generalized): Secondary | ICD-10-CM | POA: Diagnosis not present

## 2017-09-23 DIAGNOSIS — I5031 Acute diastolic (congestive) heart failure: Secondary | ICD-10-CM | POA: Diagnosis not present

## 2017-09-23 DIAGNOSIS — I509 Heart failure, unspecified: Secondary | ICD-10-CM | POA: Diagnosis not present

## 2017-09-23 DIAGNOSIS — N183 Chronic kidney disease, stage 3 (moderate): Secondary | ICD-10-CM | POA: Diagnosis not present

## 2017-09-23 DIAGNOSIS — R262 Difficulty in walking, not elsewhere classified: Secondary | ICD-10-CM | POA: Diagnosis not present

## 2017-09-23 DIAGNOSIS — J449 Chronic obstructive pulmonary disease, unspecified: Secondary | ICD-10-CM | POA: Diagnosis not present

## 2017-09-24 DIAGNOSIS — M6281 Muscle weakness (generalized): Secondary | ICD-10-CM | POA: Diagnosis not present

## 2017-09-24 DIAGNOSIS — N183 Chronic kidney disease, stage 3 (moderate): Secondary | ICD-10-CM | POA: Diagnosis not present

## 2017-09-24 DIAGNOSIS — J449 Chronic obstructive pulmonary disease, unspecified: Secondary | ICD-10-CM | POA: Diagnosis not present

## 2017-09-24 DIAGNOSIS — I509 Heart failure, unspecified: Secondary | ICD-10-CM | POA: Diagnosis not present

## 2017-09-24 DIAGNOSIS — I5031 Acute diastolic (congestive) heart failure: Secondary | ICD-10-CM | POA: Diagnosis not present

## 2017-09-24 DIAGNOSIS — R262 Difficulty in walking, not elsewhere classified: Secondary | ICD-10-CM | POA: Diagnosis not present

## 2017-09-27 DIAGNOSIS — I509 Heart failure, unspecified: Secondary | ICD-10-CM | POA: Diagnosis not present

## 2017-09-27 DIAGNOSIS — J449 Chronic obstructive pulmonary disease, unspecified: Secondary | ICD-10-CM | POA: Diagnosis not present

## 2017-09-27 DIAGNOSIS — I5031 Acute diastolic (congestive) heart failure: Secondary | ICD-10-CM | POA: Diagnosis not present

## 2017-09-27 DIAGNOSIS — M6281 Muscle weakness (generalized): Secondary | ICD-10-CM | POA: Diagnosis not present

## 2017-09-27 DIAGNOSIS — R262 Difficulty in walking, not elsewhere classified: Secondary | ICD-10-CM | POA: Diagnosis not present

## 2017-09-27 DIAGNOSIS — N183 Chronic kidney disease, stage 3 (moderate): Secondary | ICD-10-CM | POA: Diagnosis not present

## 2017-09-29 DIAGNOSIS — I5031 Acute diastolic (congestive) heart failure: Secondary | ICD-10-CM | POA: Diagnosis not present

## 2017-09-29 DIAGNOSIS — J449 Chronic obstructive pulmonary disease, unspecified: Secondary | ICD-10-CM | POA: Diagnosis not present

## 2017-09-29 DIAGNOSIS — N183 Chronic kidney disease, stage 3 (moderate): Secondary | ICD-10-CM | POA: Diagnosis not present

## 2017-09-29 DIAGNOSIS — R262 Difficulty in walking, not elsewhere classified: Secondary | ICD-10-CM | POA: Diagnosis not present

## 2017-09-29 DIAGNOSIS — I509 Heart failure, unspecified: Secondary | ICD-10-CM | POA: Diagnosis not present

## 2017-09-29 DIAGNOSIS — M6281 Muscle weakness (generalized): Secondary | ICD-10-CM | POA: Diagnosis not present

## 2017-10-01 ENCOUNTER — Ambulatory Visit (INDEPENDENT_AMBULATORY_CARE_PROVIDER_SITE_OTHER): Payer: Medicare Other | Admitting: Cardiology

## 2017-10-01 ENCOUNTER — Encounter: Payer: Self-pay | Admitting: Cardiology

## 2017-10-01 ENCOUNTER — Other Ambulatory Visit: Payer: Self-pay

## 2017-10-01 ENCOUNTER — Encounter (HOSPITAL_COMMUNITY): Payer: Self-pay | Admitting: Emergency Medicine

## 2017-10-01 ENCOUNTER — Emergency Department (HOSPITAL_COMMUNITY): Payer: Medicare Other

## 2017-10-01 ENCOUNTER — Inpatient Hospital Stay (HOSPITAL_COMMUNITY)
Admission: EM | Admit: 2017-10-01 | Discharge: 2017-10-06 | DRG: 291 | Disposition: A | Discharge status: Home Health | Payer: Medicare Other | Source: Ambulatory Visit | Attending: Family Medicine | Admitting: Family Medicine

## 2017-10-01 VITALS — BP 100/50 | HR 74 | Wt 170.0 lb

## 2017-10-01 DIAGNOSIS — Z7982 Long term (current) use of aspirin: Secondary | ICD-10-CM | POA: Diagnosis not present

## 2017-10-01 DIAGNOSIS — N183 Chronic kidney disease, stage 3 unspecified: Secondary | ICD-10-CM | POA: Diagnosis present

## 2017-10-01 DIAGNOSIS — J9611 Chronic respiratory failure with hypoxia: Secondary | ICD-10-CM | POA: Diagnosis not present

## 2017-10-01 DIAGNOSIS — Z87891 Personal history of nicotine dependence: Secondary | ICD-10-CM | POA: Diagnosis not present

## 2017-10-01 DIAGNOSIS — Z880 Allergy status to penicillin: Secondary | ICD-10-CM

## 2017-10-01 DIAGNOSIS — I5033 Acute on chronic diastolic (congestive) heart failure: Secondary | ICD-10-CM | POA: Diagnosis not present

## 2017-10-01 DIAGNOSIS — I10 Essential (primary) hypertension: Secondary | ICD-10-CM | POA: Diagnosis present

## 2017-10-01 DIAGNOSIS — K219 Gastro-esophageal reflux disease without esophagitis: Secondary | ICD-10-CM | POA: Diagnosis not present

## 2017-10-01 DIAGNOSIS — I130 Hypertensive heart and chronic kidney disease with heart failure and stage 1 through stage 4 chronic kidney disease, or unspecified chronic kidney disease: Secondary | ICD-10-CM | POA: Diagnosis not present

## 2017-10-01 DIAGNOSIS — R0602 Shortness of breath: Secondary | ICD-10-CM | POA: Diagnosis not present

## 2017-10-01 DIAGNOSIS — E1122 Type 2 diabetes mellitus with diabetic chronic kidney disease: Secondary | ICD-10-CM | POA: Diagnosis present

## 2017-10-01 DIAGNOSIS — F0390 Unspecified dementia without behavioral disturbance: Secondary | ICD-10-CM | POA: Diagnosis present

## 2017-10-01 DIAGNOSIS — Z9981 Dependence on supplemental oxygen: Secondary | ICD-10-CM

## 2017-10-01 DIAGNOSIS — J45909 Unspecified asthma, uncomplicated: Secondary | ICD-10-CM | POA: Diagnosis not present

## 2017-10-01 DIAGNOSIS — Z9111 Patient's noncompliance with dietary regimen: Secondary | ICD-10-CM

## 2017-10-01 DIAGNOSIS — I248 Other forms of acute ischemic heart disease: Secondary | ICD-10-CM | POA: Diagnosis present

## 2017-10-01 DIAGNOSIS — J449 Chronic obstructive pulmonary disease, unspecified: Secondary | ICD-10-CM | POA: Diagnosis present

## 2017-10-01 DIAGNOSIS — I110 Hypertensive heart disease with heart failure: Secondary | ICD-10-CM | POA: Diagnosis not present

## 2017-10-01 DIAGNOSIS — R0902 Hypoxemia: Secondary | ICD-10-CM | POA: Diagnosis not present

## 2017-10-01 DIAGNOSIS — I509 Heart failure, unspecified: Secondary | ICD-10-CM | POA: Insufficient documentation

## 2017-10-01 LAB — CBC WITH DIFFERENTIAL/PLATELET
BASOS ABS: 0 10*3/uL (ref 0.0–0.1)
Basophils Relative: 0 %
EOS ABS: 0.1 10*3/uL (ref 0.0–0.7)
EOS PCT: 1 %
HCT: 35.5 % — ABNORMAL LOW (ref 36.0–46.0)
HEMOGLOBIN: 11.5 g/dL — AB (ref 12.0–15.0)
LYMPHS PCT: 13 %
Lymphs Abs: 0.7 10*3/uL (ref 0.7–4.0)
MCH: 29.3 pg (ref 26.0–34.0)
MCHC: 32.4 g/dL (ref 30.0–36.0)
MCV: 90.3 fL (ref 78.0–100.0)
Monocytes Absolute: 0.5 10*3/uL (ref 0.1–1.0)
Monocytes Relative: 10 %
NEUTROS PCT: 76 %
Neutro Abs: 3.7 10*3/uL (ref 1.7–7.7)
PLATELETS: 88 10*3/uL — AB (ref 150–400)
RBC: 3.93 MIL/uL (ref 3.87–5.11)
RDW: 14 % (ref 11.5–15.5)
WBC: 4.9 10*3/uL (ref 4.0–10.5)

## 2017-10-01 LAB — TROPONIN I
TROPONIN I: 0.14 ng/mL — AB (ref <0.03)
TROPONIN I: 0.16 ng/mL — AB (ref <0.03)

## 2017-10-01 LAB — COMPREHENSIVE METABOLIC PANEL
ALK PHOS: 87 U/L (ref 38–126)
ALT: 38 U/L (ref 14–54)
AST: 50 U/L — AB (ref 15–41)
Albumin: 3.6 g/dL (ref 3.5–5.0)
Anion gap: 11 (ref 5–15)
BUN: 31 mg/dL — ABNORMAL HIGH (ref 6–20)
CHLORIDE: 97 mmol/L — AB (ref 101–111)
CO2: 36 mmol/L — ABNORMAL HIGH (ref 22–32)
CREATININE: 1.31 mg/dL — AB (ref 0.44–1.00)
Calcium: 9.3 mg/dL (ref 8.9–10.3)
GFR calc Af Amer: 41 mL/min — ABNORMAL LOW (ref >60)
GFR calc non Af Amer: 36 mL/min — ABNORMAL LOW (ref >60)
GLUCOSE: 113 mg/dL — AB (ref 65–99)
Potassium: 4.9 mmol/L (ref 3.5–5.1)
SODIUM: 144 mmol/L (ref 135–145)
TOTAL PROTEIN: 7.1 g/dL (ref 6.5–8.1)
Total Bilirubin: 1.1 mg/dL (ref 0.3–1.2)

## 2017-10-01 LAB — BRAIN NATRIURETIC PEPTIDE: B NATRIURETIC PEPTIDE 5: 1079 pg/mL — AB (ref 0.0–100.0)

## 2017-10-01 MED ORDER — FUROSEMIDE 10 MG/ML IJ SOLN
80.0000 mg | Freq: Once | INTRAMUSCULAR | Status: AC
Start: 2017-10-01 — End: 2017-10-01
  Administered 2017-10-01: 80 mg via INTRAVENOUS
  Filled 2017-10-01: qty 8

## 2017-10-01 MED ORDER — ASPIRIN 81 MG PO CHEW
81.0000 mg | CHEWABLE_TABLET | Freq: Every day | ORAL | Status: DC
  Administered 2017-10-02 – 2017-10-06 (×5): 81 mg via ORAL
  Filled 2017-10-01 (×5): qty 1

## 2017-10-01 MED ORDER — POTASSIUM CHLORIDE 20 MEQ PO PACK: 60.0000 meq | PACK | Freq: Two times a day (BID) | ORAL | Status: DC

## 2017-10-01 MED ORDER — ONDANSETRON HCL 4 MG/2ML IJ SOLN: 4.0000 mg | Freq: Four times a day (QID) | INTRAMUSCULAR | Status: DC | PRN

## 2017-10-01 MED ORDER — SODIUM CHLORIDE 0.9% FLUSH
3.0000 mL | Freq: Two times a day (BID) | INTRAVENOUS | Status: DC
  Administered 2017-10-01 – 2017-10-06 (×9): 3 mL via INTRAVENOUS

## 2017-10-01 MED ORDER — ADULT MULTIVITAMIN W/MINERALS CH
1.0000 | ORAL_TABLET | Freq: Every day | ORAL | Status: DC
  Administered 2017-10-02 – 2017-10-06 (×5): 1 via ORAL
  Filled 2017-10-01 (×5): qty 1

## 2017-10-01 MED ORDER — ORAL CARE MOUTH RINSE
15.0000 mL | Freq: Two times a day (BID) | OROMUCOSAL | Status: DC
  Administered 2017-10-01 – 2017-10-06 (×10): 15 mL via OROMUCOSAL

## 2017-10-01 MED ORDER — ALBUTEROL SULFATE HFA 108 (90 BASE) MCG/ACT IN AERS: 2.0000 | INHALATION_SPRAY | Freq: Four times a day (QID) | RESPIRATORY_TRACT | Status: DC | PRN

## 2017-10-01 MED ORDER — MIRTAZAPINE 30 MG PO TABS
30.0000 mg | ORAL_TABLET | Freq: Every day | ORAL | Status: DC
Start: 2017-10-01 — End: 2017-10-06
  Administered 2017-10-01 – 2017-10-05 (×5): 30 mg via ORAL
  Filled 2017-10-01 (×5): qty 1

## 2017-10-01 MED ORDER — DONEPEZIL HCL 5 MG PO TABS
10.0000 mg | ORAL_TABLET | Freq: Every day | ORAL | Status: DC
  Administered 2017-10-01 – 2017-10-05 (×5): 10 mg via ORAL
  Filled 2017-10-01 (×5): qty 2

## 2017-10-01 MED ORDER — OXYBUTYNIN CHLORIDE 5 MG PO TABS
5.0000 mg | ORAL_TABLET | Freq: Every day | ORAL | Status: DC
  Administered 2017-10-02 – 2017-10-06 (×5): 5 mg via ORAL
  Filled 2017-10-01 (×5): qty 1

## 2017-10-01 MED ORDER — FUROSEMIDE 10 MG/ML IJ SOLN
80.0000 mg | Freq: Two times a day (BID) | INTRAMUSCULAR | Status: DC
  Administered 2017-10-01 – 2017-10-03 (×5): 80 mg via INTRAVENOUS
  Filled 2017-10-01 (×6): qty 8

## 2017-10-01 MED ORDER — MOMETASONE FURO-FORMOTEROL FUM 200-5 MCG/ACT IN AERO
2.0000 | INHALATION_SPRAY | Freq: Two times a day (BID) | RESPIRATORY_TRACT | Status: DC
  Administered 2017-10-01 – 2017-10-06 (×11): 2 via RESPIRATORY_TRACT
  Filled 2017-10-01: qty 8.8

## 2017-10-01 MED ORDER — SODIUM CHLORIDE 0.9 % IV SOLN: 250.0000 mL | INTRAVENOUS | Status: DC | PRN

## 2017-10-01 MED ORDER — ALBUTEROL SULFATE (2.5 MG/3ML) 0.083% IN NEBU: 2.5000 mg | INHALATION_SOLUTION | Freq: Four times a day (QID) | RESPIRATORY_TRACT | Status: DC | PRN

## 2017-10-01 MED ORDER — ATORVASTATIN CALCIUM 20 MG PO TABS
20.0000 mg | ORAL_TABLET | Freq: Every day | ORAL | Status: DC
  Administered 2017-10-01 – 2017-10-06 (×6): 20 mg via ORAL
  Filled 2017-10-01 (×6): qty 1

## 2017-10-01 MED ORDER — SODIUM CHLORIDE 0.9% FLUSH
3.0000 mL | INTRAVENOUS | Status: DC | PRN
  Administered 2017-10-02: 3 mL via INTRAVENOUS
  Administered 2017-10-04: 10 mL via INTRAVENOUS
  Administered 2017-10-05: 3 mL via INTRAVENOUS
  Filled 2017-10-01 (×3): qty 3

## 2017-10-01 MED ORDER — BENAZEPRIL HCL 5 MG PO TABS
5.0000 mg | ORAL_TABLET | Freq: Every day | ORAL | Status: DC
  Administered 2017-10-02 – 2017-10-04 (×3): 5 mg via ORAL
  Filled 2017-10-01 (×3): qty 1

## 2017-10-01 MED ORDER — ACETAMINOPHEN 325 MG PO TABS: 650.0000 mg | ORAL_TABLET | ORAL | Status: DC | PRN

## 2017-10-01 MED ORDER — FAMOTIDINE 20 MG PO TABS
20.0000 mg | ORAL_TABLET | Freq: Every day | ORAL | Status: DC
  Administered 2017-10-02 – 2017-10-06 (×5): 20 mg via ORAL
  Filled 2017-10-01 (×5): qty 1

## 2017-10-01 NOTE — ED Triage Notes (Signed)
PT was sent over to ED today due to hypoxia and SOB with exacerbation of CHF per Dr. Wyline Mood at Cardiology this am. PT denies any chest pain at this time. PT on chronic 02 at 3L at home.

## 2017-10-01 NOTE — ED Notes (Signed)
CRITICAL VALUE ALERT  Critical Value:  Trop 0.14  Date & Time Notied:  1320, 10/01/17  Provider Notified: Dr. Rolland Bimler  Orders Received/Actions taken: no new orders

## 2017-10-01 NOTE — Progress Notes (Signed)
Clinical Summary Ms. Hartfield is a 84 y.o.female seen today for follow up of the following medical problems.   1. Chronic diastolic heart failure - - Jan 2019 echo LVEF 55-60%, grade II diastolic dysfunction.  - on oral torsemide at home - incrased SOB. Increased LE edema. Weight up 12 lbs since last visit. . Compliant with meds. Family reports noncompliant with low sodium diet.      Past Medical History:  Diagnosis Date  . Arthritis   . Asthma   . Bronchitis   . CHF (congestive heart failure) (HCC)    a. ECHO (01/2013) EF 60-65%, grade II diastolic dysfx, LA mildly dilated  . COPD (chronic obstructive pulmonary disease) (HCC)   . Diabetes mellitus without complication (HCC)   . Hypertension      Allergies  Allergen Reactions  . Erythromycin Other (See Comments)    Unknown   . Penicillins Other (See Comments)    Unknown      Current Outpatient Medications  Medication Sig Dispense Refill  . albuterol (PROVENTIL HFA;VENTOLIN HFA) 108 (90 BASE) MCG/ACT inhaler Inhale 2 puffs into the lungs every 6 (six) hours as needed for wheezing.    Marland Kitchen albuterol (PROVENTIL) (2.5 MG/3ML) 0.083% nebulizer solution Take 2.5 mg by nebulization every 6 (six) hours as needed for wheezing.    Marland Kitchen aspirin 81 MG tablet Take 81 mg by mouth daily.      Marland Kitchen atorvastatin (LIPITOR) 20 MG tablet Take 20 mg by mouth daily at 6 PM.    . benazepril (LOTENSIN) 5 MG tablet Take 1 tablet (5 mg total) by mouth daily. 90 tablet 3  . docusate sodium (COLACE) 100 MG capsule Take 200 mg by mouth at bedtime.    . donepezil (ARICEPT) 10 MG tablet Take 10 mg by mouth at bedtime.    . famotidine (PEPCID) 20 MG tablet Take 20 mg by mouth daily.    . mirtazapine (REMERON) 30 MG tablet Take 30 mg by mouth at bedtime.    . mometasone-formoterol (DULERA) 200-5 MCG/ACT AERO Inhale 2 puffs into the lungs 2 (two) times daily. 1 Inhaler 3  . Multiple Vitamin (MULTIVITAMIN WITH MINERALS) TABS tablet Take 1 tablet by mouth  daily.    Marland Kitchen oxybutynin (DITROPAN) 5 MG tablet Take 5 mg by mouth daily.    . potassium chloride (KLOR-CON) 20 MEQ packet Take 60 mEq by mouth 2 (two) times daily. 180 tablet 0  . potassium chloride SA (K-DUR,KLOR-CON) 20 MEQ tablet Take 3 tablets (60 mEq total) by mouth daily. 90 tablet 6  . torsemide (DEMADEX) 20 MG tablet TAKE 40 MG ( 2 TABLETS) IN THE AM AND 20 MG (1 TABLET) IN THE PM 270 tablet 3  . traMADol (ULTRAM) 50 MG tablet Take 50 mg by mouth 3 (three) times daily as needed for pain (Takes one tablet every morning and evening.).     No current facility-administered medications for this visit.      Past Surgical History:  Procedure Laterality Date  . ABDOMINAL HYSTERECTOMY  1975  . hands       Allergies  Allergen Reactions  . Erythromycin Other (See Comments)    Unknown   . Penicillins Other (See Comments)    Unknown       Family History  Problem Relation Age of Onset  . Arthritis Unknown   . Asthma Unknown   . Diabetes Unknown      Social History Ms. Zirkelbach reports that she has quit smoking.  She has never used smokeless tobacco. Ms. Evans reports that she does not drink alcohol.   Review of Systems CONSTITUTIONAL: No weight loss, fever, chills, weakness or fatigue.  HEENT: Eyes: No visual loss, blurred vision, double vision or yellow sclerae.No hearing loss, sneezing, congestion, runny nose or sore throat.  SKIN: No rash or itching.  CARDIOVASCULAR: no chest pain, no palpitations.  RESPIRATORY: +SOB GASTROINTESTINAL: No anorexia, nausea, vomiting or diarrhea. No abdominal pain or blood.  GENITOURINARY: No burning on urination, no polyuria NEUROLOGICAL: No headache, dizziness, syncope, paralysis, ataxia, numbness or tingling in the extremities. No change in bowel or bladder control.  MUSCULOSKELETAL: No muscle, back pain, joint pain or stiffness.  LYMPHATICS: No enlarged nodes. No history of splenectomy.  PSYCHIATRIC: No history of depression or  anxiety.  ENDOCRINOLOGIC: No reports of sweating, cold or heat intolerance. No polyuria or polydipsia.  Marland Kitchen   Physical Examination Vitals:   10/01/17 1107  BP: (!) 100/50  Pulse: 74   Vitals:   10/01/17 1107  Weight: 170 lb (77.1 kg)    Gen: resting comfortably, no acute distress HEENT: no scleral icterus, pupils equal round and reactive, no palptable cervical adenopathy,  CV: RRR, no m/r/g. Elevated JVD Resp: bilateral crackles.  GI: abdomen is soft, non-tender, non-distended, normal bowel sounds, no hepatosplenomegaly MSK: extremities are warm, 3+ bilateral LE edema  Skin: warm, no rash Neuro:  no focal deficits Psych: appropriate affect   Diagnostic Studies Jan 2019 echo Study Conclusions  - Left ventricle: The cavity size was normal. Wall thickness was   increased in a pattern of severe LVH. Systolic function was   normal. The estimated ejection fraction was in the range of 55%   to 60%. Wall motion was normal; there were no regional wall   motion abnormalities. Features are consistent with a pseudonormal   left ventricular filling pattern, with concomitant abnormal   relaxation and increased filling pressure (grade 2 diastolic   dysfunction). Doppler parameters are consistent with high   ventricular filling pressure. - Aortic valve: Mildly calcified annulus. Trileaflet; mildly   thickened leaflets. Valve area (VTI): 1.76 cm^2. Valve area   (Vmax): 2.13 cm^2. - Mitral valve: There was mild regurgitation. - Left atrium: The atrium was severely dilated. - Right ventricle: The cavity size was moderately dilated. TAPSE:   16.6 mm . Lateral annulus peak S velocity: 9.68 cm/s. - Right atrium: The atrium was severely dilated. - Atrial septum: No defect or patent foramen ovale was identified. - Pulmonary arteries: Systolic pressure was moderately increased.   PA peak pressure: 53 mm Hg (S).    Assessment and Plan  1. Acute on chronic diastolic heart failure -  patient up 12 lbs since last visit, severe LE edema, elevated JVD, and crackles on exam - by our pulse ox O2sat 70%, she is not in respiratory distress, unclear accuracy - discussed with ER staff, patient to be taken to ER - will require admission for IV diuresis. Would start with IV lasix  bid, goal net negative 2-3 liters per day. Would anticipate several days of IV diuresis   Please admit to medicine team. No need for inpatient cardiology consult today as I have seen her today and planned outlined above.      Antoine Poche, M.D.

## 2017-10-01 NOTE — H&P (Signed)
History and Physical    Brittney Li:[id number] DOB: September 04, 1929 DOA: 10/01/2017  PCP: Avon Gully, MD  Patient coming from: Home  Chief Complaint: Weight gain shortness of breath  HPI: Brittney Li is a 84 y.o. female with medical history significant of diastolic congestive heart failure, COPD, diabetes, chronic respiratory failure on 3 L of oxygen at home was seen by Dr. Wyline Mood cardiology office today and was found to be fluid overloaded with 12 pound weight gain in 2 weeks.  Patient was referred to the emergency department as her O2 sats there were low on her 3 L of oxygen.  Patient found to be in congestive heart failure exacerbation and referred for admission for such.  She denies any chest pain.  She has been swelling in her legs for several days now and is been having several days of more than normal shortness of breath.  She denies any fevers or cough.  She says she is taking her medications.  Review of Systems: As per HPI otherwise 10 point review of systems negative.   Past Medical History:  Diagnosis Date  . Arthritis   . Asthma   . Bronchitis   . CHF (congestive heart failure) (HCC)    a. ECHO (01/2013) EF 60-65%, grade II diastolic dysfx, LA mildly dilated  . COPD (chronic obstructive pulmonary disease) (HCC)   . Diabetes mellitus without complication (HCC)   . Hypertension     Past Surgical History:  Procedure Laterality Date  . ABDOMINAL HYSTERECTOMY  1975  . hands       reports that she has quit smoking. She has never used smokeless tobacco. She reports that she does not drink alcohol or use drugs.  Allergies  Allergen Reactions  . Erythromycin Other (See Comments)    Unknown   . Penicillins Other (See Comments)    Unknown     Family History  Problem Relation Age of Onset  . Arthritis Unknown   . Asthma Unknown   . Diabetes Unknown     Prior to Admission medications   Medication Sig Start Date End Date Taking? Authorizing Provider  albuterol  (PROVENTIL HFA;VENTOLIN HFA) 108 (90 BASE) MCG/ACT inhaler Inhale 2 puffs into the lungs every 6 (six) hours as needed for wheezing.    [provider]  albuterol (PROVENTIL) (2.5 MG/3ML) 0.083% nebulizer solution Take 2.5 mg by nebulization every 6 (six) hours as needed for wheezing.    [provider]  aspirin 81 MG tablet Take 81 mg by mouth daily.      [provider]  atorvastatin (LIPITOR) 20 MG tablet Take 20 mg by mouth daily at 6 PM.    [provider]  benazepril (LOTENSIN) 5 MG tablet Take 1 tablet (5 mg total) by mouth daily. 07/13/17   Strader, Lennart Pall, PA-C  docusate sodium (COLACE) 100 MG capsule Take 200 mg by mouth at bedtime.    [provider]  donepezil (ARICEPT) 10 MG tablet Take 10 mg by mouth at bedtime.    [provider]  famotidine (PEPCID) 20 MG tablet Take 20 mg by mouth daily.    [provider]  mirtazapine (REMERON) 30 MG tablet Take 30 mg by mouth at bedtime.    [provider]  mometasone-formoterol (DULERA) 200-5 MCG/ACT AERO Inhale 2 puffs into the lungs 2 (two) times daily. 02/06/13   Avon Gully, MD  Multiple Vitamin (MULTIVITAMIN WITH MINERALS) TABS tablet Take 1 tablet by mouth daily.  [provider]  oxybutynin (DITROPAN) 5 MG tablet Take 5 mg by mouth daily.    [provider]  potassium chloride (KLOR-CON) 20 MEQ packet Take 60 mEq by mouth 2 (two) times daily. 06/28/17   Erick Blinks, MD  potassium chloride SA (K-DUR,KLOR-CON) 20 MEQ tablet Take 3 tablets (60 mEq total) by mouth daily. 07/13/17   Strader, Lennart Pall, PA-C  torsemide (DEMADEX) 20 MG tablet TAKE 40 MG ( 2 TABLETS) IN THE AM AND 20 MG (1 TABLET) IN THE PM 07/13/17   Strader, [place] M, PA-C  traMADol (ULTRAM) 50 MG tablet Take 50 mg by mouth 3 (three) times daily as needed for pain (Takes one tablet every morning and evening.).    [provider]    Physical Exam: Vitals:   10/01/17  1200 10/01/17 1330 10/01/17 1400 10/01/17 1430  BP: 108/61 116/64 (!) 113/53 120/67  Pulse: 62 (!) 56 (!) 57 (!) 57  Resp: Temp:      TempSrc:      SpO2: 98% 99% 100% 100%  Weight:      Height:          Constitutional: NAD, calm, comfortable Vitals:   10/01/17 1200 10/01/17 1330 10/01/17 1400 10/01/17 1430  BP: 108/61 116/64 (!) 113/53 120/67  Pulse: 62 (!) 56 (!) 57 (!) 57  Resp: Temp:      TempSrc:      SpO2: 98% 99% 100% 100%  Weight:      Height:       Eyes: PERRL, lids and conjunctivae normal ENMT: Mucous membranes are moist. Posterior pharynx clear of any exudate or lesions.Normal dentition.  Neck: normal, supple, no masses, no thyromegaly Respiratory: clear to auscultation bilaterally, no wheezing, with bilateral crackles. Normal respiratory effort. No accessory muscle use.  Cardiovascular: Regular rate and rhythm, no murmurs / rubs / gallops.  3+ extremity edema. 2+ pedal pulses. No carotid bruits.  Abdomen: no tenderness, no masses palpated. No hepatosplenomegaly. Bowel sounds positive.  Musculoskeletal: no clubbing / cyanosis. No joint deformity upper and lower extremities. Good ROM, no contractures. Normal muscle tone.  Skin: no rashes, lesions, ulcers. No induration Neurologic: CN 2-12 grossly intact. Sensation intact, DTR normal. Strength 5/5 in all 4.  Psychiatric: Normal judgment and insight. Alert and oriented x 3. Normal mood.    Labs on Admission: I have personally reviewed following labs and imaging studies  CBC: Recent Labs  Lab 10/01/17 1225  WBC 4.9  NEUTROABS 3.7  HGB 11.5*  HCT 35.5*  MCV 90.3  PLT 88*   Basic Metabolic Panel: Recent Labs  Lab 10/01/17 1225  NA 144  K 4.9  CL 97*  CO2 36*  GLUCOSE 113*  BUN 31*  CREATININE 1.31*  CALCIUM 9.3   GFR: Estimated Creatinine Clearance: 31.6 mL/min (A) (by C-G formula based on SCr of 1.31 mg/dL (H)). Liver Function Tests: Recent Labs  Lab 10/01/17 1225    AST 50*  ALT 38  ALKPHOS 87  BILITOT 1.1  PROT 7.1  ALBUMIN 3.6   No results for input(s): LIPASE, AMYLASE in the last 168 hours. No results for input(s): AMMONIA in the last 168 hours. Coagulation Profile: No results for input(s): INR, PROTIME in the last 168 hours. Cardiac Enzymes: Recent Labs  Lab 10/01/17 1225  TROPONINI 0.14*   BNP (last 3 results) No results for input(s): PROBNP in the last 8760 hours. HbA1C: No results for input(s): HGBA1C in  the last 72 hours. CBG: No results for input(s): GLUCAP in the last 168 hours. Lipid Profile: No results for input(s): CHOL, HDL, LDLCALC, TRIG, CHOLHDL, LDLDIRECT in the last 72 hours. Thyroid Function Tests: No results for input(s): TSH, T4TOTAL, FREET4, T3FREE, THYROIDAB in the last 72 hours. Anemia Panel: No results for input(s): VITAMINB12, FOLATE, FERRITIN, TIBC, IRON, RETICCTPCT in the last 72 hours. Urine analysis:    Component Value Date/Time   COLORURINE YELLOW 02/28/2013 0656   APPEARANCEUR CLEAR 02/28/2013 0656   LABSPEC 1.008 02/28/2013 0656   PHURINE 5.0 02/28/2013 0656   GLUCOSEU NEGATIVE 02/28/2013 0656   HGBUR MODERATE (A) 02/28/2013 0656   BILIRUBINUR NEGATIVE 02/28/2013 0656   KETONESUR NEGATIVE 02/28/2013 0656   PROTEINUR 30 (A) 02/28/2013 0656   UROBILINOGEN 1.0 02/28/2013 0656   NITRITE NEGATIVE 02/28/2013 0656   LEUKOCYTESUR SMALL (A) 02/28/2013 0656   Sepsis Labs: !!!!!!!!!!!!!!!!!!!!!!!!!!!!!!!!!!!!!!!!!!!! (procalcitonin:4,lacticidven:4) )No results found for this or any previous visit (from the past 240 hour(s)).   Radiological Exams on Admission: Dg Chest Port 1 View  Result Date: 10/01/2017 CLINICAL DATA:  Shortness of breath for least 1 week. Congestive heart failure. EXAM: PORTABLE CHEST 1 VIEW COMPARISON:  Radiographs 06/23/2017.  CT 10/03/2015. FINDINGS: 1152 hours. Stable cardiomegaly and aortic atherosclerosis. The pulmonary vascularity is normal. Mild chronic interstitial  prominence and pleural thickening. No edema, confluent airspace opacity or significant pleural effusion. Glenohumeral degenerative changes are present bilaterally. There are degenerative changes throughout the spine. Telemetry leads overlie the chest. IMPRESSION: Stable cardiomegaly.  No edema or acute findings seen. Electronically Signed   By: Carey Bullocks M.D.   On: 10/01/2017 12:12    EKG: Independently reviewed.  Old right bundle branch block compared with old  Old chart reviewed   chest x-ray reviewed no edema or infiltrate  Case assessment EDp    Assessment/Plan 84 year old female with acute on chronic diastolic congestive heart failure exacerbation Principal Problem:   Acute on chronic diastolic heart failure (HCC)-troponin marginally elevated.  We will continue to serial.  Patient not have any chest pain.  Likely due to demand ischemia.  Placed on Lasix 80 mg IV every 12 hours.  Will not repeat echocardiogram at this time and will manage clinically.  Last echo was in January 2019.  Cardiology aware and will see in the morning.   Active Problems:   Dementia-mild   Essential hypertension-continue home meds   Asthma-continue home albuterol   GERD stable stable-   CKD (chronic kidney disease) stage 3, GFR 30-59 ml/min (HCC)-   Chronic respiratory failure with hypoxia (HCC)-appears to be stable on her 3 L of oxygen at this time.    DVT prophylaxis: SCDs ambulate Code Status: Full Family Communication: None Disposition Plan: Per day team  Consults called:  cardiology  Admission status: Admission   Ornella Coderre A MD [facility]  If 7PM-7AM, please contact night-coverage [email or web address] Password [email or web address]  10/01/2017, 3:17 PM

## 2017-10-01 NOTE — ED Provider Notes (Signed)
[facility] EMERGENCY DEPARTMENT Provider Note   CSN: [id number] Arrival date & time: 10/01/17  1140     History   Chief Complaint Chief Complaint  Patient presents with  . Shortness of Breath    HPI Brittney Li is a 84 y.o. female.  HPI Patient has history of diastolic congestive heart failure who is being referred from her cardiologist, Dr. Wyline Mood for admission for IV diuresis.  Patient has had 12 pound weight gain over the last month and has had increasing shortness of breath especially with exertion.  She has minimal nonproductive cough but denies any fever or chills.  States she has occasional lightheadedness.  Denies chest pressure or pain.  Has been compliant with her medications but family reports patient has not been compliant with low-sodium diet. Past Medical History:  Diagnosis Date  . Arthritis   . Asthma   . Bronchitis   . CHF (congestive heart failure) (HCC)    a. ECHO (01/2013) EF 60-65%, grade II diastolic dysfx, LA mildly dilated  . COPD (chronic obstructive pulmonary disease) (HCC)   . Diabetes mellitus without complication (HCC)   . Hypertension     Patient Active Problem List   Diagnosis Date Noted  . CHF (congestive heart failure) (HCC) 10/01/2017  . Thrombocytopenia (HCC) 06/24/2017  . CHF exacerbation (HCC) 06/23/2017  . Cardiorenal syndrome with renal failure 03/02/2013  . Acute renal failure (ARF) (HCC) 02/28/2013  . Acute hyperkalemia 02/28/2013  . Acute on chronic respiratory failure with hypoxia (HCC) 02/28/2013  . Acute diastolic heart failure (HCC) 02/28/2013  . CKD (chronic kidney disease) stage 3, GFR 30-59 ml/min (HCC) 02/28/2013  . Hypoglycemia 02/28/2013  . Acute on chronic diastolic heart failure (HCC) 02/02/2013  . Impingement syndrome of left shoulder 09/21/2012  . Rotator cuff tear arthropathy 09/21/2012  . Arthritis of shoulder region, degenerative 09/21/2012  . Arthritis of hand, degenerative 02/17/2011  . Arthritis of  wrist, left, degenerative 01/06/2011  . WRIST PAIN, LEFT 04/15/2010  . SHOULDER PAIN 11/08/2008  . IMPINGEMENT SYNDROME 11/08/2008  . TRIGGER FINGER, RIGHT THUMB 12/28/2006  . DEPRESSION 11/29/2006  . Dementia 11/05/2006  . CONSTIPATION, DRUG INDUCED 10/08/2006  . VERTIGO 09/10/2006  . HLD (hyperlipidemia) 04/27/2006  . SYNDROME, CARPAL TUNNEL 04/27/2006  . Essential hypertension 04/27/2006  . ALLERGIC RHINITIS 04/27/2006  . BRONCHITIS, CHRONIC NOS 04/27/2006  . Asthma 04/27/2006  . GERD 04/27/2006  . FIBROCYSTIC BREAST DISEASE 04/27/2006  . URINARY INCONTINENCE 04/27/2006  . CEREBROVASCULAR ACCIDENT, HX OF 04/27/2006    Past Surgical History:  Procedure Laterality Date  . ABDOMINAL HYSTERECTOMY  1975  . hands       OB History    Gravida  1   Para      Term      Preterm      AB      Living  1     SAB      TAB      Ectopic      Multiple      Live Births               Home Medications    Prior to Admission medications   Medication Sig Start Date End Date Taking? Authorizing Provider  albuterol (PROVENTIL HFA;VENTOLIN HFA) 108 (90 BASE) MCG/ACT inhaler Inhale 2 puffs into the lungs every 6 (six) hours as needed for wheezing.    [provider]  albuterol (PROVENTIL) (2.5 MG/3ML) 0.083% nebulizer solution Take 2.5 mg by nebulization every 6 (  six) hours as needed for wheezing.    [provider]  aspirin 81 MG tablet Take 81 mg by mouth daily.      [provider]  atorvastatin (LIPITOR) 20 MG tablet Take 20 mg by mouth daily at 6 PM.    [provider]  benazepril (LOTENSIN) 5 MG tablet Take 1 tablet (5 mg total) by mouth daily. 07/13/17   Strader, Lennart Pall, PA-C  docusate sodium (COLACE) 100 MG capsule Take 200 mg by mouth at bedtime.    [provider]  donepezil (ARICEPT) 10 MG tablet Take 10 mg by mouth at bedtime.    [provider]  famotidine (PEPCID) 20 MG tablet Take 20 mg by mouth daily.     [provider]  mirtazapine (REMERON) 30 MG tablet Take 30 mg by mouth at bedtime.    [provider]  mometasone-formoterol (DULERA) 200-5 MCG/ACT AERO Inhale 2 puffs into the lungs 2 (two) times daily. 02/06/13   Avon Gully, MD  Multiple Vitamin (MULTIVITAMIN WITH MINERALS) TABS tablet Take 1 tablet by mouth daily.    [provider]  oxybutynin (DITROPAN) 5 MG tablet Take 5 mg by mouth daily.    [provider]  potassium chloride (KLOR-CON) 20 MEQ packet Take 60 mEq by mouth 2 (two) times daily. 06/28/17   Erick Blinks, MD  potassium chloride SA (K-DUR,KLOR-CON) 20 MEQ tablet Take 3 tablets (60 mEq total) by mouth daily. 07/13/17   Strader, Lennart Pall, PA-C  torsemide (DEMADEX) 20 MG tablet TAKE 40 MG ( 2 TABLETS) IN THE AM AND 20 MG (1 TABLET) IN THE PM 07/13/17   Strader, [place] M, PA-C  traMADol (ULTRAM) 50 MG tablet Take 50 mg by mouth 3 (three) times daily as needed for pain (Takes one tablet every morning and evening.).    [provider]    Family History Family History  Problem Relation Age of Onset  . Arthritis Unknown   . Asthma Unknown   . Diabetes Unknown     Social History Social History   Tobacco Use  . Smoking status: Former Games developer  . Smokeless tobacco: Never Used  . Tobacco comment: quit smoking 1990  Substance Use Topics  . Alcohol use: No  . Drug use: No     Allergies   Erythromycin and Penicillins   Review of Systems Review of Systems  Constitutional: Negative for chills and fever.  Eyes: Negative for visual disturbance.  Respiratory: Positive for cough and shortness of breath. Negative for wheezing.   Cardiovascular: Positive for leg swelling. Negative for chest pain.  Gastrointestinal: Negative for abdominal pain, nausea and vomiting.  Genitourinary: Negative for dysuria and frequency.  Musculoskeletal: Negative for back pain, myalgias and neck pain.  Skin: Negative for rash and wound.    Neurological: Positive for light-headedness. Negative for weakness, numbness and headaches.  All other systems reviewed and are negative.    Physical Exam Updated Vital Signs BP 120/67   Pulse (!) 57   Temp 98.1 F (36.7 C) (Oral)   Resp 17   Ht  (1.651 m)   Wt 77.1 kg (170 lb)   SpO2 100%   BMI 28.29 kg/m   Physical Exam  Constitutional: She is oriented to person, place, and time. She appears well-developed and well-nourished. No distress.  HENT:  Head: Normocephalic and atraumatic.  Mouth/Throat: Oropharynx is clear and moist.  Eyes: Pupils are equal, round, and reactive to light. EOM are normal.  Neck: Normal  range of motion. Neck supple. JVD present.  Cardiovascular: Normal rate and regular rhythm. Exam reveals no gallop and no friction rub.  No murmur heard. Pulmonary/Chest: Effort normal. No respiratory distress. She has rales.  Crackles in bilateral lung fields  Abdominal: Soft. Bowel sounds are normal. There is no tenderness. There is no rebound and no guarding.  Musculoskeletal: Normal range of motion. She exhibits edema. She exhibits no tenderness.  3+ bilateral lower extremity pitting edema.   Neurological: She is alert and oriented to person, place, and time.  Moves all extremities without focal deficit.  Sensation intact.  Skin: Skin is warm and dry. No rash noted. She is not diaphoretic. No erythema.  Psychiatric: She has a normal mood and affect. Her behavior is normal.  Nursing note and vitals reviewed.    ED Treatments / Results  Labs (all labs ordered are listed, but only abnormal results are displayed) Labs Reviewed  CBC WITH DIFFERENTIAL/PLATELET - Abnormal; Notable for the following components:      Result Value   Hemoglobin 11.5 (*)    HCT 35.5 (*)    Platelets 88 (*)    All other components within normal limits  COMPREHENSIVE METABOLIC PANEL - Abnormal; Notable for the following components:   Chloride 97 (*)    CO2 36 (*)    Glucose,  Bld 113 (*)    BUN 31 (*)    Creatinine, Ser 1.31 (*)    AST 50 (*)    GFR calc non Af Amer 36 (*)    GFR calc Af Amer 41 (*)    All other components within normal limits  TROPONIN I - Abnormal; Notable for the following components:   Troponin I 0.14 (*)    All other components within normal limits  BRAIN NATRIURETIC PEPTIDE - Abnormal; Notable for the following components:   B Natriuretic Peptide 1,079.0 (*)    All other components within normal limits  URINALYSIS, ROUTINE W REFLEX MICROSCOPIC    EKG EKG Interpretation  Date/Time:  Friday Oct 01 2017 11:43:43 EDT Ventricular Rate:  69 PR Interval:    QRS Duration: 130 QT Interval:  437 QTC Calculation: 469 R Axis:   -82 Text Interpretation:  Junctional rhythm Right bundle branch block Inferior infarct, old Anterior infarct, old Confirmed by Donnetta Hutching ([id number]) on 10/01/2017 12:14:56 PM   Radiology Dg Chest Port 1 View  Result Date: 10/01/2017 CLINICAL DATA:  Shortness of breath for least 1 week. Congestive heart failure. EXAM: PORTABLE CHEST 1 VIEW COMPARISON:  Radiographs 06/23/2017.  CT 10/03/2015. FINDINGS: 1152 hours. Stable cardiomegaly and aortic atherosclerosis. The pulmonary vascularity is normal. Mild chronic interstitial prominence and pleural thickening. No edema, confluent airspace opacity or significant pleural effusion. Glenohumeral degenerative changes are present bilaterally. There are degenerative changes throughout the spine. Telemetry leads overlie the chest. IMPRESSION: Stable cardiomegaly.  No edema or acute findings seen. Electronically Signed   By: Carey Bullocks M.D.   On: 10/01/2017 12:12    Procedures Procedures (including critical care time)  Medications Ordered in ED Medications  furosemide (LASIX) injection 80 mg (80 mg Intravenous Given 10/01/17 1253)     Initial Impression / Assessment and Plan / ED Course  I have reviewed the triage vital signs and the nursing notes.  Pertinent labs &  imaging results that were available during my care of the patient were reviewed by me and considered in my medical decision making (see chart for details).     Per Dr. Reginia Naas note,  recommends admission to medicine team and IV Lasix 80 mg twice daily.   Discussed with Dr. Purvis Sheffield regarding patient's mild elevation in troponin.  Continues to have no chest pain.  Advises treating fluid overload and trending troponin.  Cardiology consult as needed.  Dr. Onalee Hua, hospitalist service will admit patient. Final Clinical Impressions(s) / ED Diagnoses   Final diagnoses:  Acute on chronic diastolic (congestive) heart failure [facility])    ED Discharge Orders    None       Loren Racer, MD 10/01/17 1506

## 2017-10-01 NOTE — Progress Notes (Signed)
Patient arrived to room 331.  Alert and oriented.  External catheter leaking.  Catheter replaced, linen and gown changed.  Tele box 35 applied and verified.  Dr. Onalee Hua in to see patient.

## 2017-10-01 NOTE — Patient Instructions (Signed)
Pt direct to ED via [facility], room 18 per Norberta Keens, ED   Dr.Branch spoke with ED attending

## 2017-10-02 DIAGNOSIS — J45909 Unspecified asthma, uncomplicated: Secondary | ICD-10-CM

## 2017-10-02 LAB — BASIC METABOLIC PANEL
Anion gap: 10 (ref 5–15)
BUN: 29 mg/dL — AB (ref 6–20)
CALCIUM: 8.8 mg/dL — AB (ref 8.9–10.3)
CO2: 38 mmol/L — ABNORMAL HIGH (ref 22–32)
CREATININE: 1.22 mg/dL — AB (ref 0.44–1.00)
Chloride: 96 mmol/L — ABNORMAL LOW (ref 101–111)
GFR calc Af Amer: 45 mL/min — ABNORMAL LOW (ref >60)
GFR, EST NON AFRICAN AMERICAN: 39 mL/min — AB (ref >60)
GLUCOSE: 91 mg/dL (ref 65–99)
Potassium: 3.8 mmol/L (ref 3.5–5.1)
Sodium: 144 mmol/L (ref 135–145)

## 2017-10-02 LAB — TROPONIN I: TROPONIN I: 0.18 ng/mL — AB (ref <0.03)

## 2017-10-02 LAB — GLUCOSE, CAPILLARY: Glucose-Capillary: 136 mg/dL — ABNORMAL HIGH (ref 65–99)

## 2017-10-02 MED ORDER — ENOXAPARIN SODIUM 40 MG/0.4ML ~~LOC~~ SOLN
40.0000 mg | SUBCUTANEOUS | Status: DC
  Administered 2017-10-02 – 2017-10-05 (×4): 40 mg via SUBCUTANEOUS
  Filled 2017-10-02 (×4): qty 0.4

## 2017-10-02 NOTE — Progress Notes (Signed)
PROGRESS NOTE    Brittney Li  ZOX:[id number] DOB: 06-Nov-1929 DOA: 10/01/2017 PCP: Avon Gully, MD    Brief Narrative:  84 year old female was sent to the emergency room from cardiology office with volume overload and 12 pound weight gain over 2 weeks.  She was found to be in decompensated CHF and admitted to the hospital for intravenous diuresis.   Assessment & Plan:   Principal Problem:   Acute on chronic diastolic (congestive) heart failure (HCC) Active Problems:   Dementia   Essential hypertension   Asthma   GERD   CKD (chronic kidney disease) stage 3, GFR 30-59 ml/min (HCC)   Chronic respiratory failure with hypoxia (HCC)   1. Acute on chronic diastolic congestive heart failure.  Currently on intravenous Lasix.  Fair urine output.  Still appears to be volume overloaded.  Continue to monitor intake and output.  Echocardiogram recently performed 06/2017.  Continue current treatment 2. Elevated troponin.  Suspect demand ischemia in the setting of decompensated CHF.  No complaints of chest pain. 3. Chronic respiratory failure with hypoxia.  She is chronically on 3 L of oxygen.  Appears to be near baseline oxygen requirement. 4. Asthma.  Appears stable.  Continue on albuterol and Dulera. 5. Hypertension.  Currently stable.  Continue current medications. 6. Chronic kidney disease stage III.  Creatinine is currently stable.  Monitor in the setting of diuresis. 7. Dementia, mild, continue Aricept   DVT prophylaxis: Lovenox Code Status: Full code Family Communication: No family present Disposition Plan: Discharge home once adequately diuresed   Consultants:     Procedures:     Antimicrobials:      Subjective: Shortness of breath improving, but not back to baseline. No chest pain  Objective: Vitals:   10/01/17 1653 10/01/17 2009 10/01/17 2139 10/02/17 0507  BP: (!) 123/93  122/64 (!) 105/51  Pulse: 65  (!) 58 (!) 57  Resp: Temp: 97.7 F (36.5 C)   98.4 F (36.9 C) 98.4 F (36.9 C)  TempSrc: Oral  Oral Oral  SpO2: 99% 90% 100% 100%  Weight:    76.2 kg (167 lb 15.9 oz)  Height:        Intake/Output Summary (Last 24 hours) at 10/02/2017 1623 Last data filed at 10/02/2017 0900 Gross per 24 hour  Intake 480 ml  Output 950 ml  Net -470 ml   Filed Weights   10/01/17 1145 10/02/17 0507  Weight: 77.1 kg (170 lb) 76.2 kg (167 lb 15.9 oz)    Examination:  General exam: Appears calm and comfortable  Respiratory system: bibasilar crackles. Respiratory effort normal. Cardiovascular system: S1 & S2 heard, RRR. No JVD, murmurs, rubs, gallops or clicks. 1+ pedal edema. Gastrointestinal system: Abdomen is nondistended, soft and nontender. No organomegaly or masses felt. Normal bowel sounds heard. Central nervous system: Alert and oriented. No focal neurological deficits. Extremities: Symmetric 5 x 5 power. Skin: No rashes, lesions or ulcers Psychiatry: Judgement and insight appear normal. Mood & affect appropriate.     Data Reviewed: I have personally reviewed following labs and imaging studies  CBC: Recent Labs  Lab 10/01/17 1225  WBC 4.9  NEUTROABS 3.7  HGB 11.5*  HCT 35.5*  MCV 90.3  PLT 88*   Basic Metabolic Panel: Recent Labs  Lab 10/01/17 1225 10/02/17 0508  NA 144 144  K 4.9 3.8  CL 97* 96*  CO2 36* 38*  GLUCOSE 113* 91  BUN 31* 29*  CREATININE 1.31* 1.22*  CALCIUM  9.3 8.8*   GFR: Estimated Creatinine Clearance: 33.8 mL/min (A) (by C-G formula based on SCr of 1.22 mg/dL (H)). Liver Function Tests: Recent Labs  Lab 10/01/17 1225  AST 50*  ALT 38  ALKPHOS 87  BILITOT 1.1  PROT 7.1  ALBUMIN 3.6   No results for input(s): LIPASE, AMYLASE in the last 168 hours. No results for input(s): AMMONIA in the last 168 hours. Coagulation Profile: No results for input(s): INR, PROTIME in the last 168 hours. Cardiac Enzymes: Recent Labs  Lab 10/01/17 1225 10/01/17 1537 10/02/17 0508  TROPONINI 0.14* 0.16*  0.18*   BNP (last 3 results) No results for input(s): PROBNP in the last 8760 hours. HbA1C: No results for input(s): HGBA1C in the last 72 hours. CBG: No results for input(s): GLUCAP in the last 168 hours. Lipid Profile: No results for input(s): CHOL, HDL, LDLCALC, TRIG, CHOLHDL, LDLDIRECT in the last 72 hours. Thyroid Function Tests: No results for input(s): TSH, T4TOTAL, FREET4, T3FREE, THYROIDAB in the last 72 hours. Anemia Panel: No results for input(s): VITAMINB12, FOLATE, FERRITIN, TIBC, IRON, RETICCTPCT in the last 72 hours. Sepsis Labs: No results for input(s): PROCALCITON, LATICACIDVEN in the last 168 hours.  No results found for this or any previous visit (from the past 240 hour(s)).       Radiology Studies: Dg Chest Port 1 View  Result Date: 10/01/2017 CLINICAL DATA:  Shortness of breath for least 1 week. Congestive heart failure. EXAM: PORTABLE CHEST 1 VIEW COMPARISON:  Radiographs 06/23/2017.  CT 10/03/2015. FINDINGS: 1152 hours. Stable cardiomegaly and aortic atherosclerosis. The pulmonary vascularity is normal. Mild chronic interstitial prominence and pleural thickening. No edema, confluent airspace opacity or significant pleural effusion. Glenohumeral degenerative changes are present bilaterally. There are degenerative changes throughout the spine. Telemetry leads overlie the chest. IMPRESSION: Stable cardiomegaly.  No edema or acute findings seen. Electronically Signed   By: Carey Bullocks M.D.   On: 10/01/2017 12:12        Scheduled Meds: . aspirin  81 mg Oral Daily  . atorvastatin  20 mg Oral q1800  . benazepril  5 mg Oral Daily  . donepezil  10 mg Oral QHS  . famotidine  20 mg Oral Daily  . furosemide  80 mg Intravenous Q12H  . mouth rinse  15 mL Mouth Rinse BID  . mirtazapine  30 mg Oral QHS  . mometasone-formoterol  2 puff Inhalation BID  . multivitamin with minerals  1 tablet Oral Daily  . oxybutynin  5 mg Oral Daily  . sodium chloride flush  3 mL  Intravenous Q12H   Continuous Infusions: . sodium chloride       LOS: 1 day    Time spent:    Erick Blinks, MD [facility] Pager [phone]  If 7PM-7AM, please contact night-coverage [email or web address] Password [facility] 10/02/2017, 4:23 PM

## 2017-10-02 NOTE — Progress Notes (Signed)
Patient urinated in bed. Purewick in place but did not catch all urine. Patient soaked through two pads and sheet. 300 ml currently in purewixk canister as well.

## 2017-10-03 LAB — BASIC METABOLIC PANEL
ANION GAP: 3 — AB (ref 5–15)
BUN: 25 mg/dL — ABNORMAL HIGH (ref 6–20)
CO2: 42 mmol/L — AB (ref 22–32)
Calcium: 8.4 mg/dL — ABNORMAL LOW (ref 8.9–10.3)
Chloride: 96 mmol/L — ABNORMAL LOW (ref 101–111)
Creatinine, Ser: 1.11 mg/dL — ABNORMAL HIGH (ref 0.44–1.00)
GFR calc non Af Amer: 44 mL/min — ABNORMAL LOW (ref >60)
GFR, EST AFRICAN AMERICAN: 51 mL/min — AB (ref >60)
Glucose, Bld: 103 mg/dL — ABNORMAL HIGH (ref 65–99)
Potassium: 3.6 mmol/L (ref 3.5–5.1)
Sodium: 141 mmol/L (ref 135–145)

## 2017-10-03 MED ORDER — METOLAZONE 5 MG PO TABS
5.0000 mg | ORAL_TABLET | Freq: Once | ORAL | Status: AC
  Administered 2017-10-03: 5 mg via ORAL
  Filled 2017-10-03: qty 1

## 2017-10-03 NOTE — Progress Notes (Signed)
PROGRESS NOTE    Brittney Li  ZOX:[id number] DOB: 11-19-1929 DOA: 10/01/2017 PCP: Avon Gully, MD    Brief Narrative:  84 year old female was sent to the emergency room from cardiology office with volume overload and 12 pound weight gain over 2 weeks.  She was found to be in decompensated CHF and admitted to the hospital for intravenous diuresis.   Assessment & Plan:   Principal Problem:   Acute on chronic diastolic (congestive) heart failure (HCC) Active Problems:   Dementia   Essential hypertension   Asthma   GERD   CKD (chronic kidney disease) stage 3, GFR 30-59 ml/min (HCC)   Chronic respiratory failure with hypoxia (HCC)   1. Acute on chronic diastolic congestive heart failure.  Currently on intravenous Lasix.  Patient only had 1 L of urine output yesterday.  Still appears to be volume overloaded.  We will add a dose of metolazone today.  Continue to monitor intake and output.  Echocardiogram recently performed 06/2017.  Continue current treatment 2. Elevated troponin.  Suspect demand ischemia in the setting of decompensated CHF.  No complaints of chest pain. 3. Chronic respiratory failure with hypoxia.  She is chronically on 3 L of oxygen.  Appears to be near baseline oxygen requirement. 4. Asthma.  Appears stable.  Continue on albuterol and Dulera. 5. Hypertension.  Currently stable.  Continue current medications. 6. Chronic kidney disease stage III.  Creatinine is currently stable.  Monitor in the setting of diuresis. 7. Dementia, mild, continue Aricept   DVT prophylaxis: Lovenox Code Status: Full code Family Communication: No family present Disposition Plan: Discharge home once adequately diuresed   Consultants:     Procedures:     Antimicrobials:      Subjective: Still feels short of breath.  No chest pain.  Objective: Vitals:   10/03/17 0547 10/03/17 0843 10/03/17 1508 10/03/17 1517  BP: (!) 108/55  (!) 205/189 (!) 120/52  Pulse: 61  62     Resp: 20  20   Temp: 98.6 F (37 C)  98.5 F (36.9 C)   TempSrc: Oral  Oral   SpO2: 100% 95% 99%   Weight: 76.8 kg (169 lb 5 oz)     Height:        Intake/Output Summary (Last 24 hours) at 10/03/2017 1807 Last data filed at 10/03/2017 1600 Gross per 24 hour  Intake 990 ml  Output 1050 ml  Net -60 ml   Filed Weights   10/01/17 1145 10/02/17 0507 10/03/17 0547  Weight: 77.1 kg (170 lb) 76.2 kg (167 lb 15.9 oz) 76.8 kg (169 lb 5 oz)    Examination:  General exam: Alert, awake, oriented x 3 Respiratory system: Bibasilar crackles. Respiratory effort normal. Cardiovascular system:RRR. No murmurs, rubs, gallops. Gastrointestinal system: Abdomen is nondistended, soft and nontender. No organomegaly or masses felt. Normal bowel sounds heard. Central nervous system: Alert and oriented. No focal neurological deficits. Extremities: 1+ edema bilaterally Skin: No rashes, lesions or ulcers Psychiatry: Judgement and insight appear normal. Mood & affect appropriate.   Data Reviewed: I have personally reviewed following labs and imaging studies  CBC: Recent Labs  Lab 10/01/17 1225  WBC 4.9  NEUTROABS 3.7  HGB 11.5*  HCT 35.5*  MCV 90.3  PLT 88*   Basic Metabolic Panel: Recent Labs  Lab 10/01/17 1225 10/02/17 0508 10/03/17 0701  NA 144 144 141  K 4.9 3.8 3.6  CL 97* 96* 96*  CO2 36* 38* 42*  GLUCOSE 113* 91 103*  BUN 31* 29* 25*  CREATININE 1.31* 1.22* 1.11*  CALCIUM 9.3 8.8* 8.4*   GFR: Estimated Creatinine Clearance: 37.3 mL/min (A) (by C-G formula based on SCr of 1.11 mg/dL (H)). Liver Function Tests: Recent Labs  Lab 10/01/17 1225  AST 50*  ALT 38  ALKPHOS 87  BILITOT 1.1  PROT 7.1  ALBUMIN 3.6   No results for input(s): LIPASE, AMYLASE in the last 168 hours. No results for input(s): AMMONIA in the last 168 hours. Coagulation Profile: No results for input(s): INR, PROTIME in the last 168 hours. Cardiac Enzymes: Recent Labs  Lab 10/01/17 1225  10/01/17 1537 10/02/17 0508  TROPONINI 0.14* 0.16* 0.18*   BNP (last 3 results) No results for input(s): PROBNP in the last 8760 hours. HbA1C: No results for input(s): HGBA1C in the last 72 hours. CBG: Recent Labs  Lab 10/02/17 2134  GLUCAP 136*   Lipid Profile: No results for input(s): CHOL, HDL, LDLCALC, TRIG, CHOLHDL, LDLDIRECT in the last 72 hours. Thyroid Function Tests: No results for input(s): TSH, T4TOTAL, FREET4, T3FREE, THYROIDAB in the last 72 hours. Anemia Panel: No results for input(s): VITAMINB12, FOLATE, FERRITIN, TIBC, IRON, RETICCTPCT in the last 72 hours. Sepsis Labs: No results for input(s): PROCALCITON, LATICACIDVEN in the last 168 hours.  No results found for this or any previous visit (from the past 240 hour(s)).       Radiology Studies: No results found.      Scheduled Meds: . aspirin  81 mg Oral Daily  . atorvastatin  20 mg Oral q1800  . benazepril  5 mg Oral Daily  . donepezil  10 mg Oral QHS  . enoxaparin (LOVENOX) injection  40 mg Subcutaneous Q24H  . famotidine  20 mg Oral Daily  . furosemide  80 mg Intravenous Q12H  . mouth rinse  15 mL Mouth Rinse BID  . mirtazapine  30 mg Oral QHS  . mometasone-formoterol  2 puff Inhalation BID  . multivitamin with minerals  1 tablet Oral Daily  . oxybutynin  5 mg Oral Daily  . sodium chloride flush  3 mL Intravenous Q12H   Continuous Infusions: . sodium chloride       LOS: 2 days    Time spent:    Erick Blinks, MD [facility] Pager [phone]  If 7PM-7AM, please contact night-coverage [email or web address] Password [email or web address] 10/03/2017, 6:07 PM

## 2017-10-04 ENCOUNTER — Encounter (HOSPITAL_COMMUNITY): Payer: Self-pay | Admitting: Family Medicine

## 2017-10-04 DIAGNOSIS — K219 Gastro-esophageal reflux disease without esophagitis: Secondary | ICD-10-CM

## 2017-10-04 LAB — BASIC METABOLIC PANEL
Anion gap: 8 (ref 5–15)
BUN: 24 mg/dL — ABNORMAL HIGH (ref 6–20)
CHLORIDE: 91 mmol/L — AB (ref 101–111)
CO2: 44 mmol/L — AB (ref 22–32)
Calcium: 8.6 mg/dL — ABNORMAL LOW (ref 8.9–10.3)
Creatinine, Ser: 1.04 mg/dL — ABNORMAL HIGH (ref 0.44–1.00)
GFR calc non Af Amer: 47 mL/min — ABNORMAL LOW (ref >60)
GFR, EST AFRICAN AMERICAN: 55 mL/min — AB (ref >60)
Glucose, Bld: 96 mg/dL (ref 65–99)
POTASSIUM: 3.4 mmol/L — AB (ref 3.5–5.1)
SODIUM: 143 mmol/L (ref 135–145)

## 2017-10-04 MED ORDER — FUROSEMIDE 10 MG/ML IJ SOLN
80.0000 mg | Freq: Three times a day (TID) | INTRAMUSCULAR | Status: DC
  Administered 2017-10-04 – 2017-10-05 (×6): 80 mg via INTRAVENOUS
  Filled 2017-10-04 (×6): qty 8

## 2017-10-04 MED ORDER — METOLAZONE 5 MG PO TABS
5.0000 mg | ORAL_TABLET | Freq: Once | ORAL | Status: AC
  Administered 2017-10-04: 5 mg via ORAL
  Filled 2017-10-04: qty 1

## 2017-10-04 MED ORDER — POTASSIUM CHLORIDE CRYS ER 20 MEQ PO TBCR
60.0000 meq | EXTENDED_RELEASE_TABLET | Freq: Once | ORAL | Status: AC
Start: 2017-10-04 — End: 2017-10-04
  Administered 2017-10-04: 60 meq via ORAL
  Filled 2017-10-04: qty 6

## 2017-10-04 NOTE — Evaluation (Signed)
Physical Therapy Evaluation Patient Details Name: Brittney Li MRN: [id number] DOB: 08-27-1929 Today's Date: 10/04/2017   History of Present Illness  Brittney Li is a 84 y.o. female with medical history significant of diastolic congestive heart failure, COPD, diabetes, chronic respiratory failure on 3 L of oxygen at home was seen by Dr. Wyline Mood cardiology office today and was found to be fluid overloaded with 12 pound weight gain in 2 weeks.  Patient was referred to the emergency department as her O2 sats there were low on her 3 L of oxygen.  Patient found to be in congestive heart failure exacerbation and referred for admission for such.  She denies any chest pain.  She has been swelling in her legs for several days now and is been having several days of more than normal shortness of breath.  She denies any fevers or cough.  She says she is taking her medications.    Clinical Impression  Patient functioning near baseline for functional mobility/gait, mostly limited due to c/o SOB after ambulating in hallway, no loss of balance and tolerated sitting up in chair after therapy - RN notified.  Patient will benefit from continued physical therapy in hospital and recommended venue below to increase strength, balance, endurance for safe ADLs and gait.    Follow Up Recommendations Home health PT;Supervision - Intermittent    Equipment Recommendations  None recommended by PT    Recommendations for Other Services       Precautions / Restrictions Precautions Precautions: Fall Restrictions Weight Bearing Restrictions: No      Mobility  Bed Mobility Overal bed mobility: Modified Independent                Transfers Overall transfer level: Needs assistance Equipment used: Rolling walker (2 wheeled);None Transfers: Sit to/from [employer] Sit to Stand: Supervision Stand pivot transfers: Supervision       General transfer comment: uses RW or armrest of  BSC  Ambulation/Gait Ambulation/Gait assistance: Supervision Ambulation Distance (Feet): 30 Feet Assistive device: Rolling walker (2 wheeled) Gait Pattern/deviations: Decreased step length - right;Decreased step length - left;Decreased stride length Gait velocity: slow   General Gait Details: demonstrates slow slightly labored cadence without loss of balance, limited secondary to fatigue/SOB while on 3 LPM O2  Stairs            Wheelchair Mobility    Modified Rankin (Stroke Patients Only)       Balance Overall balance assessment: Needs assistance Sitting-balance support: Feet supported;No upper extremity supported Sitting balance-Leahy Scale: Good     Standing balance support: Bilateral upper extremity supported;During functional activity Standing balance-Leahy Scale: Fair                               Pertinent Vitals/Pain Pain Assessment: No/denies pain    Home Living Family/patient expects to be discharged to:: Private residence Living Arrangements: Alone Available Help at Discharge: Personal care attendant;Available PRN/intermittently Type of Home: Apartment Home Access: Ramped entrance     Home Layout: One level Home Equipment: Walker - standard;Bedside commode;Tub bench;Wheelchair - manual;Cane - single point;Walker - 2 wheels      Prior Function Level of Independence: Needs assistance   Gait / Transfers Assistance Needed: household ambulation with RW  ADL's / Homemaking Assistance Needed: home aides 7 hours/day x 6 days/week, 5 hours/day on Saturday        Hand Dominance  Extremity/Trunk Assessment   Upper Extremity Assessment Upper Extremity Assessment: Generalized weakness    Lower Extremity Assessment Lower Extremity Assessment: Generalized weakness    Cervical / Trunk Assessment Cervical / Trunk Assessment: Normal  Communication   Communication: No difficulties  Cognition Arousal/Alertness:  Awake/alert Behavior During Therapy: WFL for tasks assessed/performed Overall Cognitive Status: Within Functional Limits for tasks assessed                                        General Comments      Exercises     Assessment/Plan    PT Assessment Patient needs continued PT services  PT Problem List Decreased strength;Decreased activity tolerance;Decreased balance;Decreased mobility       PT Treatment Interventions Gait training;Functional mobility training;Therapeutic activities;Therapeutic exercise;Patient/family education;Stair training    PT Goals (Current goals can be found in the Care Plan section)  Acute Rehab PT Goals Patient Stated Goal: return home PT Goal Formulation: With patient Time For Goal Achievement: 10/09/17 Potential to Achieve Goals: Good    Frequency Min 3X/week   Barriers to discharge        Co-evaluation               AM-PAC PT "6 Clicks" Daily Activity  Outcome Measure Difficulty turning over in bed (including adjusting bedclothes, sheets and blankets)?: None Difficulty moving from lying on back to sitting on the side of the bed? : None Difficulty sitting down on and standing up from a chair with arms (e.g., wheelchair, bedside commode, etc,.)?: A Little Help needed moving to and from a bed to chair (including a wheelchair)?: A Little Help needed walking in hospital room?: A Little Help needed climbing 3-5 steps with a railing? : A Little 6 Click Score: 20    End of Session Equipment Utilized During Treatment: Gait belt;Oxygen Activity Tolerance: Patient tolerated treatment well;Patient limited by fatigue(Patient limited by SOB) Patient left: in chair;with call bell/phone within reach Nurse Communication: Mobility status;Other (comment)(RN notified that patient left up in chair) PT Visit Diagnosis: Unsteadiness on feet (R26.81);Other abnormalities of gait and mobility (R26.89);Muscle weakness (generalized) (M62.81)     Time: 1610-9604 PT Time Calculation (min) (ACUTE ONLY): 31 min   Charges:   PT Evaluation $PT Eval Moderate Complexity: 1 Mod PT Treatments $Therapeutic Activity: 23-37 mins   PT G Codes:        3:17 PM, 10-20-2017 Ocie Bob, MPT Physical Therapist with [facility] [phone] office [phone] mobile phone

## 2017-10-04 NOTE — Progress Notes (Signed)
Progress Note  Patient Name: Brittney Li Date of Encounter: 10/04/2017  Primary Cardiologist: Dina Rich, MD   Subjective   Ongoing SOB  Inpatient Medications    Scheduled Meds: . aspirin  81 mg Oral Daily  . atorvastatin  20 mg Oral q1800  . benazepril  5 mg Oral Daily  . donepezil  10 mg Oral QHS  . enoxaparin (LOVENOX) injection  40 mg Subcutaneous Q24H  . famotidine  20 mg Oral Daily  . furosemide  80 mg Intravenous Q12H  . mouth rinse  15 mL Mouth Rinse BID  . mirtazapine  30 mg Oral QHS  . mometasone-formoterol  2 puff Inhalation BID  . multivitamin with minerals  1 tablet Oral Daily  . oxybutynin  5 mg Oral Daily  . sodium chloride flush  3 mL Intravenous Q12H   Continuous Infusions: . sodium chloride     PRN Meds: sodium chloride, acetaminophen, albuterol, ondansetron (ZOFRAN) IV, sodium chloride flush   Vital Signs    Vitals:   10/03/17 1517 10/03/17 1938 10/03/17 2109 10/04/17 0557  BP: (!) 120/52  (!) 114/55 (!) 112/53  Pulse:   66 63  Resp:   20 20  Temp:   99 F (37.2 C) 98.3 F (36.8 C)  TempSrc:   Oral Oral  SpO2:  97% 96% 95%  Weight:    162 lb 7.7 oz (73.7 kg)  Height:        Intake/Output Summary (Last 24 hours) at 10/04/2017 0815 Last data filed at 10/03/2017 1944 Gross per 24 hour  Intake 240 ml  Output 650 ml  Net -410 ml   Filed Weights   10/02/17 0507 10/03/17 0547 10/04/17 0557  Weight: 167 lb 15.9 oz (76.2 kg) 169 lb 5 oz (76.8 kg) 162 lb 7.7 oz (73.7 kg)    Telemetry    SR, 1st degree AV block - Personally Reviewed  ECG    na  Physical Exam   GEN: No acute distress.   Neck: elevated Cardiac: RRR, no murmurs, rubs, or gallops.  Respiratory: Clear to auscultation bilaterally. GI: Soft, nontender, non-distended  MS: 1+ bilateral LE edema; No deformity. Neuro:  Nonfocal  Psych: Normal affect   Labs    Chemistry Recent Labs  Lab 10/01/17 1225 10/02/17 0508 10/03/17 0701 10/04/17 0601  NA 144 144 141  143  K 4.9 3.8 3.6 3.4*  CL 97* 96* 96* 91*  CO2 36* 38* 42* 44*  GLUCOSE 113* 91 103* 96  BUN 31* 29* 25* 24*  CREATININE 1.31* 1.22* 1.11* 1.04*  CALCIUM 9.3 8.8* 8.4* 8.6*  PROT 7.1  --   --   --   ALBUMIN 3.6  --   --   --   AST 50*  --   --   --   ALT 38  --   --   --   ALKPHOS 87  --   --   --   BILITOT 1.1  --   --   --   GFRNONAA 36* 39* 44* 47*  GFRAA 41* 45* 51* 55*  ANIONGAP 11 10 3* 8     Hematology Recent Labs  Lab 10/01/17 1225  WBC 4.9  RBC 3.93  HGB 11.5*  HCT 35.5*  MCV 90.3  MCH 29.3  MCHC 32.4  RDW 14.0  PLT 88*    Cardiac Enzymes Recent Labs  Lab 10/01/17 1225 10/01/17 1537 10/02/17 0508  TROPONINI 0.14* 0.16* 0.18*   No results for  input(s): TROPIPOC in the last 168 hours.   BNP Recent Labs  Lab 10/01/17 1225  BNP 1,079.0*     DDimer No results for input(s): DDIMER in the last 168 hours.   Radiology    No results found.  Cardiac Studies    Patient Profile   Brittney Li is a 84 y.o. female with past medical history of chronic diastolic CHF, COPD (on 3L [place] at baseline), HTN, HLD, and Type 2 DM admitted with SOB and volume overload.     Assessment & Plan    1. Acute on chronic diastolic HF - - Jan 2019 echo LVEF 55-60%, grade II diastolic dysfunction.  - seen in clinic Friday. Had 12 lbs weight gain (158-->170), severe LE edema, increased SOB/DOE - Negative yeterday, according to charting negativ since admission. She is on lasix  bid and received a one time dose of metolazone  yesterday, downtrend in Cr with diuresis consistent with venous congestion and CHF>  - hospital weights reportedely down to 162 lbs. Does not aggree with reported I/O data. Remains volume overloaded by exam - increase lasix to IV  tid, redose metolazone  today.     2. Elevated troponin - mild and flat in setting of diastolic HF, no plans for ischemic testing at this time    For questions or updates, please contact  [facility] Please consult [email or web address] for contact info under Cardiology/STEMI.      Joanie Coddington, MD  10/04/2017, 8:15 AM

## 2017-10-04 NOTE — Care Management Important Message (Signed)
Important Message  Patient Details  Name: Brittney Li MRN: [id number] Date of Birth: 1930-05-22   Medicare Important Message Given:  Yes    Renie Ora 10/04/2017, 11:28 AM

## 2017-10-04 NOTE — Progress Notes (Signed)
PROGRESS NOTE    Brittney Li  ONG:[id number] DOB: [birth year] DOA: 10/01/2017 PCP: Avon Gully, MD   Brief Narrative:  84 year old female was sent to the emergency room from cardiology office with volume overload and 12 pound weight gain over 2 weeks.  She was found to be in decompensated CHF and admitted to the hospital for intravenous diuresis.   Assessment & Plan:   Principal Problem:   Acute on chronic diastolic (congestive) heart failure (HCC) Active Problems:   Dementia   Essential hypertension   Asthma   GERD   CKD (chronic kidney disease) stage 3, GFR 30-59 ml/min (HCC)   Chronic respiratory failure with hypoxia (HCC)  1. Acute on chronic diastolic congestive heart failure.  Currently on intravenous Lasix.  Cardiology following and increased dose. See orders.  Still volume overloaded.  Continue to monitor intake and output.  Echocardiogram recently performed 06/2017.  2. Elevated troponin.  Suspect demand ischemia in the setting of decompensated CHF.  No complaints of chest pain. 3. Chronic respiratory failure with hypoxia.  She is chronically on 3 L of oxygen.  Appears to be near baseline oxygen requirement. 4. Asthma.  Appears stable.  Continue on albuterol and Dulera. 5. Hypertension.  Currently stable.  Continue current medications. 6. Chronic kidney disease stage III.  Creatinine is currently stable.  Monitor in the setting of diuresis. 7. Dementia, mild, continue Aricept  DVT prophylaxis: Lovenox Code Status: Full code Family Communication: No family present Disposition Plan: Needs ongoing IV diuresis per cardiology team  Subjective: Still feels short of breath but starting to feel a little better.   Objective: Vitals:   10/03/17 1517 10/03/17 1938 10/03/17 2109 10/04/17 0557  BP: (!) 120/52  (!) 114/55 (!) 112/53  Pulse:   66 63  Resp:   20 20  Temp:   99 F (37.2 C) 98.3 F (36.8 C)  TempSrc:   Oral Oral  SpO2:  97% 96% 95%  Weight:    73.7 kg (162  lb 7.7 oz)  Height:        Intake/Output Summary (Last 24 hours) at 10/04/2017 0947 Last data filed at 10/04/2017 0851 Gross per 24 hour  Intake 240 ml  Output 1000 ml  Net -760 ml   Filed Weights   10/02/17 0507 10/03/17 0547 10/04/17 0557  Weight: 76.2 kg (167 lb 15.9 oz) 76.8 kg (169 lb 5 oz) 73.7 kg (162 lb 7.7 oz)   Examination:  General exam: Alert, awake, oriented x 3 Respiratory system: Bibasilar crackles. Respiratory effort normal. Cardiovascular system: normal s1,s2 sounds. No murmurs, rubs, gallops. Gastrointestinal system: Abdomen is nondistended, soft and nontender. No organomegaly or masses felt. Normal bowel sounds heard. Central nervous system: Alert and oriented. No focal neurological deficits. Extremities: 1+ edema bilaterally LEs, tender lower extremities bilateral. Skin: No rashes, lesions or ulcers Psychiatry: Judgement and insight appear normal. Mood & affect appropriate.   Data Reviewed: I have personally reviewed following labs and imaging studies  CBC: Recent Labs  Lab 10/01/17 1225  WBC 4.9  NEUTROABS 3.7  HGB 11.5*  HCT 35.5*  MCV 90.3  PLT 88*   Basic Metabolic Panel: Recent Labs  Lab 10/01/17 1225 10/02/17 0508 10/03/17 0701 10/04/17 0601  NA 144 144 141 143  K 4.9 3.8 3.6 3.4*  CL 97* 96* 96* 91*  CO2 36* 38* 42* 44*  GLUCOSE 113* 91 103* 96  BUN 31* 29* 25* 24*  CREATININE 1.31* 1.22* 1.11* 1.04*  CALCIUM 9.3 8.8* 8.4*  8.6*   GFR: Estimated Creatinine Clearance: 39 mL/min (A) (by C-G formula based on SCr of 1.04 mg/dL (H)). Liver Function Tests: Recent Labs  Lab 10/01/17 1225  AST 50*  ALT 38  ALKPHOS 87  BILITOT 1.1  PROT 7.1  ALBUMIN 3.6   No results for input(s): LIPASE, AMYLASE in the last 168 hours. No results for input(s): AMMONIA in the last 168 hours. Coagulation Profile: No results for input(s): INR, PROTIME in the last 168 hours. Cardiac Enzymes: Recent Labs  Lab 10/01/17 1225 10/01/17 1537 10/02/17 0508   TROPONINI 0.14* 0.16* 0.18*   BNP (last 3 results) No results for input(s): PROBNP in the last 8760 hours. HbA1C: No results for input(s): HGBA1C in the last 72 hours. CBG: Recent Labs  Lab 10/02/17 2134  GLUCAP 136*   Lipid Profile: No results for input(s): CHOL, HDL, LDLCALC, TRIG, CHOLHDL, LDLDIRECT in the last 72 hours. Thyroid Function Tests: No results for input(s): TSH, T4TOTAL, FREET4, T3FREE, THYROIDAB in the last 72 hours. Anemia Panel: No results for input(s): VITAMINB12, FOLATE, FERRITIN, TIBC, IRON, RETICCTPCT in the last 72 hours. Sepsis Labs: No results for input(s): PROCALCITON, LATICACIDVEN in the last 168 hours.  No results found for this or any previous visit (from the past 240 hour(s)).   Radiology Studies: No results found.   Scheduled Meds: . aspirin  81 mg Oral Daily  . atorvastatin  20 mg Oral q1800  . benazepril  5 mg Oral Daily  . donepezil  10 mg Oral QHS  . enoxaparin (LOVENOX) injection  40 mg Subcutaneous Q24H  . famotidine  20 mg Oral Daily  . furosemide  80 mg Intravenous TID  . mouth rinse  15 mL Mouth Rinse BID  . mirtazapine  30 mg Oral QHS  . mometasone-formoterol  2 puff Inhalation BID  . multivitamin with minerals  1 tablet Oral Daily  . oxybutynin  5 mg Oral Daily  . sodium chloride flush  3 mL Intravenous Q12H   Continuous Infusions: . sodium chloride       LOS: 3 days   Time spent: 24 mins  Standley Dakins, MD [facility] Pager [phone]  If 7PM-7AM, please contact night-coverage [email or web address] Password [facility] 10/04/2017, 9:47 AM

## 2017-10-04 NOTE — Care Management Note (Addendum)
Case Management Note  Patient Details  Name: Brittney Li MRN: [id number] Date of Birth: 10/24/1929  Subjective/Objective:      Admitted with CHF. Pt from home, lives alone, has no family but strong support from friend who is a Engineer, civil (consulting). Pt has aid 7 hrs per day. Friend takes her to MD appointments and helps manage medications. Pt is active with [facility]. She has home O2, RW, WC and cane. She uses the RW the most. Pt has scale and reports compliance with diet and daily weights. This is pt's second admit in 6 months, on [facility] registry but not active, per documentation they had difficulty contacting patient for red flags on Emmi calls after last DC.             Action/Plan: DC home with resumption of HH services. CM will cont to follow.   Expected Discharge Date:  10/04/17               Expected Discharge Plan:  Home w Home Health Services  In-House Referral:  NA  Discharge planning Services  CM Consult  Post Acute Care Choice:  Home Health, Resumption of Svcs/PTA Provider Choice offered to:  Patient  HH Arranged:  RN, PT [facility] Agency:  [facility]  Status of Service:  In process, will continue to follow  [name], RN 10/04/2017, 12:31 PM

## 2017-10-04 NOTE — Plan of Care (Signed)
  Problem: Acute Rehab PT Goals(only PT should resolve) Goal: Patient Will Transfer Sit To/From Stand Outcome: Progressing Flowsheets (Taken 10/04/2017 1519) Patient will transfer sit to/from stand: with modified independence Goal: Pt Will Transfer Bed To Chair/Chair To Bed Outcome: Progressing Flowsheets (Taken 10/04/2017 1519) Pt will Transfer Bed to Chair/Chair to Bed: with modified independence Goal: Pt Will Ambulate Outcome: Progressing Flowsheets (Taken 10/04/2017 1519) Pt will Ambulate: with modified independence;50 feet;with rolling walker  3:21 PM, 10/04/17 Ocie Bob, MPT Physical Therapist with [facility] 336 [phone] office [phone] mobile phone

## 2017-10-04 NOTE — Progress Notes (Signed)
PT Cancellation Note  Patient Details Name: Brittney Li MRN: [id number] DOB: 1930/02/12   Cancelled Treatment:    Reason Eval/Treat Not Completed: Medical issues which prohibited therapy  [name] is not appropriate for evaluation/treatment at this time due to elevated troponin levels that are trending upward since hospital admission. Values listed below indicated hypocalcemia and elevated troponin. Will follow up at later date/time when patient is medically appropriate.    Recent Labs  Lab 10/01/17 1225 10/02/17 0508 10/03/17 0701 10/04/17 0601  NA 144 144 141 143  K 4.9 3.8 3.6 3.4*  CL 97* 96* 96* 91*  CO2 36* 38* 42* 44*  GLUCOSE 113* 91 103* 96  BUN 31* 29* 25* 24*  CREATININE 1.31* 1.22* 1.11* 1.04*  CALCIUM 9.3 8.8* 8.4* 8.6*    Recent Labs  Lab 10/01/17 1225 10/01/17 1537 10/02/17 0508  TROPONINI 0.14* 0.16* 0.18*     Valentino Saxon, PT, DPT Physical Therapist with [facility]  10/04/2017 10:26 AM

## 2017-10-05 LAB — MAGNESIUM: Magnesium: 2 mg/dL (ref 1.7–2.4)

## 2017-10-05 LAB — BASIC METABOLIC PANEL
Anion gap: 6 (ref 5–15)
BUN: 29 mg/dL — ABNORMAL HIGH (ref 6–20)
CHLORIDE: 87 mmol/L — AB (ref 101–111)
CO2: 48 mmol/L — AB (ref 22–32)
CREATININE: 1.26 mg/dL — AB (ref 0.44–1.00)
Calcium: 8.9 mg/dL (ref 8.9–10.3)
GFR calc Af Amer: 43 mL/min — ABNORMAL LOW (ref >60)
GFR calc non Af Amer: 37 mL/min — ABNORMAL LOW (ref >60)
GLUCOSE: 94 mg/dL (ref 65–99)
Potassium: 3.7 mmol/L (ref 3.5–5.1)
Sodium: 141 mmol/L (ref 135–145)

## 2017-10-05 MED ORDER — METOLAZONE 5 MG PO TABS
2.5000 mg | ORAL_TABLET | Freq: Once | ORAL | Status: AC
  Administered 2017-10-05: 2.5 mg via ORAL
  Filled 2017-10-05: qty 1

## 2017-10-05 MED ORDER — BENAZEPRIL HCL 5 MG PO TABS
2.5000 mg | ORAL_TABLET | Freq: Every day | ORAL | Status: DC
  Administered 2017-10-05 – 2017-10-06 (×2): 2.5 mg via ORAL
  Filled 2017-10-05 (×2): qty 1

## 2017-10-05 NOTE — Progress Notes (Signed)
Physical Therapy Treatment Patient Details Name: Brittney Li MRN: [id number] DOB: February 11, 1930 Today's Date: 10/05/2017    History of Present Illness Brittney Li is a 84 y.o. female with medical history significant of diastolic congestive heart failure, COPD, diabetes, chronic respiratory failure on 3 L of oxygen at home was seen by Dr. Wyline Mood cardiology office today and was found to be fluid overloaded with 12 pound weight gain in 2 weeks.  Patient was referred to the emergency department as her O2 sats there were low on her 3 L of oxygen.  Patient found to be in congestive heart failure exacerbation and referred for admission for such.  She denies any chest pain.  She has been swelling in her legs for several days now and is been having several days of more than normal shortness of breath.  She denies any fevers or cough.  She says she is taking her medications.    PT Comments    Patient presents up in chair and agreeable for therapy.  Patient requires frequent rest breaks while completing BLE exercises secondary to mild SOB, fatigue, required much time to complete sit to stands and limited for standing due to c/o fatigue, had to take 2-3 minute rest break prior to gait training and able to ambulate in hallway without loss of balance.   Patient continued sitting up in chair after therapy.  Patient will benefit from continued physical therapy in hospital and recommended venue below to increase strength, balance, endurance for safe ADLs and gait.    Follow Up Recommendations  Home health PT;Supervision - Intermittent     Equipment Recommendations  None recommended by PT    Recommendations for Other Services       Precautions / Restrictions Precautions Precautions: Fall Restrictions Weight Bearing Restrictions: No    Mobility  Bed Mobility                  Transfers Overall transfer level: Needs assistance Equipment used: Rolling walker (2 wheeled) Transfers: Sit to/from  Stand;Stand Pivot Transfers Sit to Stand: Supervision Stand pivot transfers: Supervision       General transfer comment: slow labored movement for sit to stands using bilateral armrest of chair  Ambulation/Gait Ambulation/Gait assistance: Supervision Ambulation Distance (Feet): 30 Feet Assistive device: Rolling walker (2 wheeled) Gait Pattern/deviations: Decreased step length - right;Decreased step length - left;Decreased stride length Gait velocity: slow   General Gait Details: slow labored cadence without loss of balance, limited secondary to c/o fatigue/SOB while on 3 LPM O2   Stairs             Wheelchair Mobility    Modified Rankin (Stroke Patients Only)       Balance Overall balance assessment: Needs assistance Sitting-balance support: Feet supported;No upper extremity supported Sitting balance-Leahy Scale: Good     Standing balance support: Bilateral upper extremity supported;During functional activity Standing balance-Leahy Scale: Fair Standing balance comment: using RW                            Cognition Arousal/Alertness: Awake/alert Behavior During Therapy: WFL for tasks assessed/performed Overall Cognitive Status: Within Functional Limits for tasks assessed                                        Exercises General Exercises - Lower Extremity Long Arc Quad: Seated;AROM;Strengthening;Both;10 reps  Hip Flexion/Marching: Seated;AROM;Strengthening;Both;10 reps Toe Raises: Seated;AROM;Strengthening;Both;10 reps Heel Raises: Seated;AROM;Strengthening;Both;10 reps    General Comments        Pertinent Vitals/Pain Pain Assessment: No/denies pain    Home Living                      Prior Function            PT Goals (current goals can now be found in the care plan section) Acute Rehab PT Goals Patient Stated Goal: return home PT Goal Formulation: With patient Time For Goal Achievement: 10/09/17 Potential  to Achieve Goals: Good Progress towards PT goals: Progressing toward goals    Frequency    Min 3X/week      PT Plan Current plan remains appropriate    Co-evaluation              AM-PAC PT "6 Clicks" Daily Activity  Outcome Measure  Difficulty turning over in bed (including adjusting bedclothes, sheets and blankets)?: None Difficulty moving from lying on back to sitting on the side of the bed? : None Difficulty sitting down on and standing up from a chair with arms (e.g., wheelchair, bedside commode, etc,.)?: A Little Help needed moving to and from a bed to chair (including a wheelchair)?: A Little Help needed walking in hospital room?: A Little Help needed climbing 3-5 steps with a railing? : A Little 6 Click Score: 20    End of Session Equipment Utilized During Treatment: Gait belt;Oxygen Activity Tolerance: Patient tolerated treatment well;Patient limited by fatigue Patient left: in chair;with call bell/phone within reach Nurse Communication: Mobility status PT Visit Diagnosis: Unsteadiness on feet (R26.81);Other abnormalities of gait and mobility (R26.89);Muscle weakness (generalized) (M62.81)     Time: 1610-9604 PT Time Calculation (min) (ACUTE ONLY): 27 min  Charges:  $Therapeutic Exercise: 8-22 mins $Therapeutic Activity: 8-22 mins                    G Codes:       1:44 PM, 10-31-2017 Ocie Bob, MPT Physical Therapist with [facility] [phone] office [phone] mobile phone

## 2017-10-05 NOTE — Progress Notes (Signed)
Progress Note  Patient Name: Brittney Li Date of Encounter: 10/05/2017  Primary Cardiologist: Dina Rich, MD   Subjective   Still some SOB  Inpatient Medications    Scheduled Meds: . aspirin  81 mg Oral Daily  . atorvastatin  20 mg Oral q1800  . benazepril  5 mg Oral Daily  . donepezil  10 mg Oral QHS  . enoxaparin (LOVENOX) injection  40 mg Subcutaneous Q24H  . famotidine  20 mg Oral Daily  . furosemide  80 mg Intravenous TID  . mouth rinse  15 mL Mouth Rinse BID  . mirtazapine  30 mg Oral QHS  . mometasone-formoterol  2 puff Inhalation BID  . multivitamin with minerals  1 tablet Oral Daily  . oxybutynin  5 mg Oral Daily  . sodium chloride flush  3 mL Intravenous Q12H   Continuous Infusions: . sodium chloride     PRN Meds: sodium chloride, acetaminophen, albuterol, ondansetron (ZOFRAN) IV, sodium chloride flush   Vital Signs    Vitals:   10/04/17 2045 10/05/17 0426 10/05/17 0544 10/05/17 0824  BP: 91/78 97/62    Pulse: 88 61    Resp: 18     Temp: 98.5 F (36.9 C) 98.2 F (36.8 C)    TempSrc: Oral Oral    SpO2: 97% 99%  92%  Weight:   161 lb 13.1 oz (73.4 kg)   Height:        Intake/Output Summary (Last 24 hours) at 10/05/2017 0847 Last data filed at 10/05/2017 0544 Gross per 24 hour  Intake 843 ml  Output 1300 ml  Net -457 ml   Filed Weights   10/03/17 0547 10/04/17 0557 10/05/17 0544  Weight: 169 lb 5 oz (76.8 kg) 162 lb 7.7 oz (73.7 kg) 161 lb 13.1 oz (73.4 kg)    Telemetry    SR, short run PSVT - Personally Reviewed  ECG  na  Physical Exam   GEN: No acute distress.   Neck: mildly elevated JVD Cardiac: RRR, no murmurs, rubs, or gallops.  Respiratory: Clear to auscultation bilaterally. GI: Soft, nontender, non-distended  MS: trace bilateral edema; No deformity. Neuro:  Nonfocal  Psych: Normal affect   Labs    Chemistry Recent Labs  Lab 10/01/17 1225  10/03/17 0701 10/04/17 0601 10/05/17 0546  NA 144   < > 141 143 141    K 4.9   < > 3.6 3.4* 3.7  CL 97*   < > 96* 91* 87*  CO2 36*   < > 42* 44* 48*  GLUCOSE 113*   < > 103* 96 94  BUN 31*   < > 25* 24* 29*  CREATININE 1.31*   < > 1.11* 1.04* 1.26*  CALCIUM 9.3   < > 8.4* 8.6* 8.9  PROT 7.1  --   --   --   --   ALBUMIN 3.6  --   --   --   --   AST 50*  --   --   --   --   ALT 38  --   --   --   --   ALKPHOS 87  --   --   --   --   BILITOT 1.1  --   --   --   --   GFRNONAA 36*   < > 44* 47* 37*  GFRAA 41*   < > 51* 55* 43*  ANIONGAP 11   < > 3* 8 6   < > =  values in this interval not displayed.     Hematology Recent Labs  Lab 10/01/17 1225  WBC 4.9  RBC 3.93  HGB 11.5*  HCT 35.5*  MCV 90.3  MCH 29.3  MCHC 32.4  RDW 14.0  PLT 88*    Cardiac Enzymes Recent Labs  Lab 10/01/17 1225 10/01/17 1537 10/02/17 0508  TROPONINI 0.14* 0.16* 0.18*   No results for input(s): TROPIPOC in the last 168 hours.   BNP Recent Labs  Lab 10/01/17 1225  BNP 1,079.0*     DDimer No results for input(s): DDIMER in the last 168 hours.   Radiology    No results found.  Cardiac Studies    Patient Profile    Brittney Li a 84 y.o.femalewith past medical history of chronic diastolic CHF, COPD (on3L [place] at baseline), HTN, HLD, and Type 2 DM admitted with SOB and volume overload.     Assessment & Plan    1. Acute on chronic diastolic HF - - Jan 2019 echo LVEF 55-60%, grade II diastolic dysfunction. - seen in clinic Friday. Had 12 lbs weight gain (158-->170), severe LE edema, increased SOB/DOE - Negative 457 mL yeterday, according to charting negativ 1L since admission. She is on lasix  tid and received metolazone  for last 2 days. Mild uptrend in Cr.  - hospital weights reportedely down to 161 lbs. Does not aggree with reported I/O data. Her exam is improving as far as volume status.  - had been on torsemide  in AMand  pm at home  - nearing euvolemia, anticipate 1 more day of IV diuresis and likely change to oral tomorrow.  Redose metolazine 2.5mg  once today.    2. PSVT - short episode.  - K 3.7, Mg 2  - soft bp's, isolated episode. Would not start beta blocker at this time.   3. Low bp - lower benazepril to 2.5mg  daily.   For questions or updates, please contact [facility] Please consult [email or web address] for contact info under Cardiology/STEMI.      Joanie Coddington, MD  10/05/2017, 8:47 AM

## 2017-10-05 NOTE — Progress Notes (Signed)
PROGRESS NOTE   ORTENCIA ASKARI  AOZ:[id number] DOB: 08-26-1929 DOA: 10/01/2017 PCP: Avon Gully, MD   Brief Narrative:  84 year old female was sent to the emergency room from cardiology office with volume overload and 12 pound weight gain over 2 weeks.  She was found to be in decompensated CHF and admitted to the hospital for intravenous diuresis.  Assessment & Plan:   Principal Problem:   Acute on chronic diastolic (congestive) heart failure (HCC) Active Problems:   Dementia   Essential hypertension   Asthma   GERD   CKD (chronic kidney disease) stage 3, GFR 30-59 ml/min (HCC)   Chronic respiratory failure with hypoxia (HCC)  1. Acute on chronic diastolic congestive heart failure.  Currently on intravenous Lasix.  Cardiology following and increased dose. See orders.  Still volume overloaded.  Continue to monitor intake and output.  Echocardiogram recently performed 06/2017.   Cardio team planning to diurese some more today and hopefully can transition to oral diuretic 5/8.   2. Elevated troponin.  Suspect demand ischemia in the setting of decompensated CHF.  No complaints of chest pain. 3. Chronic respiratory failure with hypoxia.  She is chronically on 3 L of oxygen.  Appears to be near baseline oxygen requirement. 4. Asthma.  Appears stable.  Continue on albuterol and Dulera. 5. Hypertension.  Currently stable.  Continue current medications. 6. Chronic kidney disease stage III.  Creatinine is currently stable.  Monitor in the setting of diuresis. 7. Dementia, mild, continue Aricept  DVT prophylaxis: Lovenox Code Status: Full code Family Communication: No family present Disposition Plan: Needs ongoing IV diuresis per cardiology team  Subjective: Pt without complaint.   Objective: Vitals:   10/04/17 2045 10/05/17 0426 10/05/17 0544 10/05/17 0824  BP: 91/78 97/62    Pulse: 88 61    Resp: 18     Temp: 98.5 F (36.9 C) 98.2 F (36.8 C)    TempSrc: Oral Oral    SpO2: 97%  99%  92%  Weight:   73.4 kg (161 lb 13.1 oz)   Height:        Intake/Output Summary (Last 24 hours) at 10/05/2017 1036 Last data filed at 10/05/2017 0937 Gross per 24 hour  Intake 723 ml  Output 950 ml  Net -227 ml   Filed Weights   10/03/17 0547 10/04/17 0557 10/05/17 0544  Weight: 76.8 kg (169 lb 5 oz) 73.7 kg (162 lb 7.7 oz) 73.4 kg (161 lb 13.1 oz)   Examination:  General exam: Alert, awake, oriented x 3 Respiratory system: No rales heard. Rare crackles.  Respiratory effort normal. Cardiovascular system: normal s1,s2 sounds. No murmurs, rubs, gallops. Gastrointestinal system: Abdomen is nondistended, soft and nontender. No organomegaly or masses felt. Normal bowel sounds heard. Central nervous system: Alert and oriented. No focal neurological deficits. Extremities: 1+ edema bilaterally LEs, tender lower extremities bilateral. Skin: No rashes, lesions or ulcers Psychiatry: Judgement and insight appear normal. Mood & affect appropriate.   Data Reviewed: I have personally reviewed following labs and imaging studies  CBC: Recent Labs  Lab 10/01/17 1225  WBC 4.9  NEUTROABS 3.7  HGB 11.5*  HCT 35.5*  MCV 90.3  PLT 88*   Basic Metabolic Panel: Recent Labs  Lab 10/01/17 1225 10/02/17 0508 10/03/17 0701 10/04/17 0601 10/05/17 0546  NA 144 144 141 143 141  K 4.9 3.8 3.6 3.4* 3.7  CL 97* 96* 96* 91* 87*  CO2 36* 38* 42* 44* 48*  GLUCOSE 113* 91 103* 96 94  BUN 31* 29* 25* 24* 29*  CREATININE 1.31* 1.22* 1.11* 1.04* 1.26*  CALCIUM 9.3 8.8* 8.4* 8.6* 8.9  MG  --   --   --   --  2.0   GFR: Estimated Creatinine Clearance: 32.2 mL/min (A) (by C-G formula based on SCr of 1.26 mg/dL (H)). Liver Function Tests: Recent Labs  Lab 10/01/17 1225  AST 50*  ALT 38  ALKPHOS 87  BILITOT 1.1  PROT 7.1  ALBUMIN 3.6   No results for input(s): LIPASE, AMYLASE in the last 168 hours. No results for input(s): AMMONIA in the last 168 hours. Coagulation Profile: No results for  input(s): INR, PROTIME in the last 168 hours. Cardiac Enzymes: Recent Labs  Lab 10/01/17 1225 10/01/17 1537 10/02/17 0508  TROPONINI 0.14* 0.16* 0.18*   BNP (last 3 results) No results for input(s): PROBNP in the last 8760 hours. HbA1C: No results for input(s): HGBA1C in the last 72 hours. CBG: Recent Labs  Lab 10/02/17 2134  GLUCAP 136*   Lipid Profile: No results for input(s): CHOL, HDL, LDLCALC, TRIG, CHOLHDL, LDLDIRECT in the last 72 hours. Thyroid Function Tests: No results for input(s): TSH, T4TOTAL, FREET4, T3FREE, THYROIDAB in the last 72 hours. Anemia Panel: No results for input(s): VITAMINB12, FOLATE, FERRITIN, TIBC, IRON, RETICCTPCT in the last 72 hours. Sepsis Labs: No results for input(s): PROCALCITON, LATICACIDVEN in the last 168 hours.  No results found for this or any previous visit (from the past 240 hour(s)).   Radiology Studies: No results found.  Scheduled Meds: . aspirin  81 mg Oral Daily  . atorvastatin  20 mg Oral q1800  . benazepril  2.5 mg Oral Daily  . donepezil  10 mg Oral QHS  . enoxaparin (LOVENOX) injection  40 mg Subcutaneous Q24H  . famotidine  20 mg Oral Daily  . furosemide  80 mg Intravenous TID  . mouth rinse  15 mL Mouth Rinse BID  . metolazone  2.5 mg Oral Once  . mirtazapine  30 mg Oral QHS  . mometasone-formoterol  2 puff Inhalation BID  . multivitamin with minerals  1 tablet Oral Daily  . oxybutynin  5 mg Oral Daily  . sodium chloride flush  3 mL Intravenous Q12H   Continuous Infusions: . sodium chloride       LOS: 4 days   Time spent: 24 mins  Standley Dakins, MD [facility] Pager [phone]  If 7PM-7AM, please contact night-coverage [email or web address] Password [email or web address] 10/05/2017, 10:36 AM

## 2017-10-06 LAB — BASIC METABOLIC PANEL
Anion gap: 9 (ref 5–15)
BUN: 37 mg/dL — AB (ref 6–20)
CO2: 48 mmol/L — ABNORMAL HIGH (ref 22–32)
Calcium: 8.8 mg/dL — ABNORMAL LOW (ref 8.9–10.3)
Chloride: 82 mmol/L — ABNORMAL LOW (ref 101–111)
Creatinine, Ser: 1.24 mg/dL — ABNORMAL HIGH (ref 0.44–1.00)
GFR calc Af Amer: 44 mL/min — ABNORMAL LOW (ref >60)
GFR, EST NON AFRICAN AMERICAN: 38 mL/min — AB (ref >60)
GLUCOSE: 93 mg/dL (ref 65–99)
POTASSIUM: 3.2 mmol/L — AB (ref 3.5–5.1)
Sodium: 139 mmol/L (ref 135–145)

## 2017-10-06 MED ORDER — BENAZEPRIL HCL 5 MG PO TABS: 2.5000 mg | ORAL_TABLET | Freq: Every day | ORAL | 3 refills | Status: DC

## 2017-10-06 MED ORDER — TORSEMIDE 20 MG PO TABS: ORAL_TABLET | ORAL | 3 refills | Status: DC

## 2017-10-06 MED ORDER — POTASSIUM CHLORIDE CRYS ER 20 MEQ PO TBCR
40.0000 meq | EXTENDED_RELEASE_TABLET | Freq: Once | ORAL | Status: AC
  Administered 2017-10-06: 40 meq via ORAL
  Filled 2017-10-06: qty 2

## 2017-10-06 MED ORDER — TORSEMIDE 20 MG PO TABS: 40.0000 mg | ORAL_TABLET | Freq: Every day | ORAL | Status: DC

## 2017-10-06 NOTE — Progress Notes (Addendum)
Progress Note  Patient Name: Brittney [name] Date of Encounter: 10/06/2017  Primary Cardiologist: Dina Rich, MD   Subjective   Sleeping but arousable. Denies SOB  Inpatient Medications    Scheduled Meds: . aspirin  81 mg Oral Daily  . atorvastatin  20 mg Oral q1800  . benazepril  2.5 mg Oral Daily  . donepezil  10 mg Oral QHS  . enoxaparin (LOVENOX) injection  40 mg Subcutaneous Q24H  . famotidine  20 mg Oral Daily  . mouth rinse  15 mL Mouth Rinse BID  . mirtazapine  30 mg Oral QHS  . mometasone-formoterol  2 puff Inhalation BID  . multivitamin with minerals  1 tablet Oral Daily  . oxybutynin  5 mg Oral Daily  . potassium chloride  40 mEq Oral Once  . sodium chloride flush  3 mL Intravenous Q12H   Continuous Infusions: . sodium chloride     PRN Meds: sodium chloride, acetaminophen, albuterol, ondansetron (ZOFRAN) IV, sodium chloride flush   Vital Signs    Vitals:   10/05/17 2009 10/05/17 2033 10/06/17 0308 10/06/17 0801  BP:  (!) 103/50 (!) 101/51   Pulse:  61 64   Resp:  18 19   Temp:  98.4 F (36.9 C) 98.6 F (37 C)   TempSrc:  Oral Oral   SpO2: 97% 100% 99% 98%  Weight:   162 lb 14.7 oz (73.9 kg)   Height:        Intake/Output Summary (Last 24 hours) at 10/06/2017 0816 Last data filed at 10/06/2017 0600 Gross per 24 hour  Intake 756 ml  Output 1100 ml  Net -344 ml   Filed Weights   10/04/17 0557 10/05/17 0544 10/06/17 0308  Weight: 162 lb 7.7 oz (73.7 kg) 161 lb 13.1 oz (73.4 kg) 162 lb 14.7 oz (73.9 kg)    Telemetry    NSR, 1st AVB, 4 bt NSWCT 5/7 - Personally Reviewed  ECG    10/01/17- NSR, 1st AVB, RBBB- Personally Reviewed  Physical Exam   GEN: No acute distress. On O2 Neck: No JVD Cardiac: RRR, no murmurs, rubs, or gallops.  Respiratory: decreased breath sounds, poor effort GI: Soft, nontender, non-distended  MS: trace edema Neuro:  Nonfocal, a little lethargic this am    Labs    Chemistry Recent Labs  Lab 10/01/17 1225   10/04/17 0601 10/05/17 0546 10/06/17 0603  NA 144   < > 143 141 139  K 4.9   < > 3.4* 3.7 3.2*  CL 97*   < > 91* 87* 82*  CO2 36*   < > 44* 48* 48*  GLUCOSE 113*   < > 96 94 93  BUN 31*   < > 24* 29* 37*  CREATININE 1.31*   < > 1.04* 1.26* 1.24*  CALCIUM 9.3   < > 8.6* 8.9 8.8*  PROT 7.1  --   --   --   --   ALBUMIN 3.6  --   --   --   --   AST 50*  --   --   --   --   ALT 38  --   --   --   --   ALKPHOS 87  --   --   --   --   BILITOT 1.1  --   --   --   --   GFRNONAA 36*   < > 47* 37* 38*  GFRAA 41*   < > 55* 43*  44*  ANIONGAP 11   < > < > = values in this interval not displayed.     Hematology Recent Labs  Lab 10/01/17 1225  WBC 4.9  RBC 3.93  HGB 11.5*  HCT 35.5*  MCV 90.3  MCH 29.3  MCHC 32.4  RDW 14.0  PLT 88*    Cardiac Enzymes Recent Labs  Lab 10/01/17 1225 10/01/17 1537 10/02/17 0508  TROPONINI 0.14* 0.16* 0.18*   No results for input(s): TROPIPOC in the last 168 hours.   BNP Recent Labs  Lab 10/01/17 1225  BNP 1,079.0*     DDimer No results for input(s): DDIMER in the last 168 hours.   Radiology    PCXR 09/30/17- EXAM: PORTABLE CHEST 1 VIEW  COMPARISON:  Radiographs 06/23/2017.  CT 10/03/2015.  FINDINGS: 1152 hours. Stable cardiomegaly and aortic atherosclerosis. The pulmonary vascularity is normal. Mild chronic interstitial prominence and pleural thickening. No edema, confluent airspace opacity or significant pleural effusion. Glenohumeral degenerative changes are present bilaterally. There are degenerative changes throughout the spine. Telemetry leads overlie the chest.  IMPRESSION: Stable cardiomegaly.  No edema or acute findings seen.  Cardiac Studies   Echo 06/24/17- Study Conclusions  - Left ventricle: The cavity size was normal. Wall thickness was   increased in a pattern of severe LVH. Systolic function was   normal. The estimated ejection fraction was in the range of 55%   to 60%. Wall motion was normal;  there were no regional wall   motion abnormalities. Features are consistent with a pseudonormal   left ventricular filling pattern, with concomitant abnormal   relaxation and increased filling pressure (grade 2 diastolic   dysfunction). Doppler parameters are consistent with high   ventricular filling pressure. - Aortic valve: Mildly calcified annulus. Trileaflet; mildly   thickened leaflets. Valve area (VTI): 1.76 cm^2. Valve area   (Vmax): 2.13 cm^2. - Mitral valve: There was mild regurgitation. - Left atrium: The atrium was severely dilated. - Right ventricle: The cavity size was moderately dilated. TAPSE:   16.6 mm . Lateral annulus peak S velocity: 9.68 cm/s. - Right atrium: The atrium was severely dilated. - Atrial septum: No defect or patent foramen ovale was identified. - Pulmonary arteries: Systolic pressure was moderately increased.   PA peak pressure: 53 mm Hg (S).  Patient Profile     84 y.o. female with past medical history of chronic diastolic CHF, COPD (on3L [place] at baseline), HTN, HLD, and Type 2 DMadmitted with SOB and volume overload.  Assessment & Plan    1. Acute on chronic diastolic HF -- Jan 2019 echo LVEF 55-60%, grade II diastolic dysfunction. - seen in clinic Friday. Had 12 lbs weight gain (158-->170), severe LE edema, increased SOB/DOE - Negative 1.4 L since admission. She is on lasix  tid and received metolazone  for last 2 days.  - hospital weights reportedely down to 162 lbs. Does not aggree with reported I/O data. Her exam is improving as far as volume status.  - had been on torsemide  in AMand  pm at home  2. PSVT - short episode.  - K 3.2, Mg 2- K+ ordered  - soft bp's, 1st degree AVB, RBBB. Would not start beta blocker at this time.   3. Low bp -  benazepril lowered to 2.5mg  daily.   Plan: difficult to tell on exam but she appears dry by labs. Change IV Lasix to Torsemide 40 mg daily. Replace K+.   For questions  or  updates, please contact [facility] Please consult [email or web address] for contact info under Cardiology/STEMI.      Signed, Corine Shelter, PA-C  10/06/2017, 8:16 AM    Attending note Patient seen and discussed with PA Diona Fanti, I agree with his documentation above. Admitted with acute on chronic diastolic HF, 12 lbs weight gain at home. Her documented I/Os have not matched her weight loss or exam (from talking with patient she has urinated in bed several times not measured). Has been on lasix IV  tid with intermittent oral metolazone. Negative just yesterday, neg 1.4 L this admission. Weight down to 162 lbs. Mild uptrend in Cr and BUN. Reports symptoms of orthostatic dizziness, we will check orthostatics We will d/c IV lasix, diuretic holiday and start torsemide  in AMand  in PM tomorrow. Electrolyte replacement per primary team.   Follow dizziness and breathing status into the afternoon, if doing well could consider discharge today, if still with some issues would keep overnight.   Dina Rich MD

## 2017-10-06 NOTE — Discharge Summary (Addendum)
Physician Discharge Summary  LAELYNN BLIZZARD ZOX:[id number] DOB: 03/14/1930 DOA: 10/01/2017  PCP: Avon Gully, MD  Admit date: 10/01/2017 Discharge date: 10/06/2017  Admitted From:  Home  Disposition: Home   Recommendations for Outpatient Follow-up:  1. Follow up with PCP in 1 weeks 2. Follow up with cardiology in 2 weeks 3. Please obtain BMP/CBC in one week  Home Health: PT, RN  Discharge Condition: STABLE   CODE STATUS: FULL    Brief Hospitalization Summary: Please see all hospital notes, images, labs for full details of the hospitalization.  HPI: Brittney [name] is a 84 y.o. female with medical history significant of diastolic congestive heart failure, COPD, diabetes, chronic respiratory failure on 3 L of oxygen at home was seen by Dr. Wyline Mood cardiology office today and was found to be fluid overloaded with 12 pound weight gain in 2 weeks.  Patient was referred to the emergency department as her O2 sats there were low on her 3 L of oxygen.  Patient found to be in congestive heart failure exacerbation and referred for admission for such.  She denies any chest pain.  She has been swelling in her legs for several days now and is been having several days of more than normal shortness of breath.  She denies any fevers or cough.  She says she is taking her medications.  Brief Narrative:  84 year old female was sent to the emergency room from cardiology office with volume overload and 12 pound weight gain over 2 weeks.  She was found to be in decompensated CHF and admitted to the hospital for intravenous diuresis.  Assessment & Plan:   Principal Problem:   Acute on chronic diastolic (congestive) heart failure (HCC) Active Problems:   Dementia   Essential hypertension   Asthma   GERD   CKD (chronic kidney disease) stage 3, GFR 30-59 ml/min (HCC)   Chronic respiratory failure with hypoxia (HCC)  1. Acute on chronic diastolic congestive heart failure.  Treated with intravenous Lasix  and diuresed.  Cardiology following and managed diuresis. They feel that she has diuresed as much as needed right now and will have her to restart her oral demadex tomorrow 60 mg AM and 40 mg PM.  Follow up with PCP and cardiology outpatient.  Echocardiogram recently performed 06/2017.   2. Elevated troponin.  Suspect demand ischemia in the setting of decompensated CHF.  No complaints of chest pain. 3. Chronic respiratory failure with hypoxia.  She is chronically on 3 L of oxygen.  Appears to be baseline oxygen requirement. 4. Asthma.  Appears stable.  Continue on albuterol and Dulera. 5. Hypertension.  Currently stable.  Continue current medications. 6. Chronic kidney disease stage III.  Creatinine is currently stable.  Monitor in the setting of diuresis. 7. Dementia, mild, continue Aricept.   DVT prophylaxis: Lovenox Code Status: Full code Family Communication: No family present Disposition Plan: Home with HH PT, RN  Discharge Diagnoses:  Principal Problem:   Acute on chronic diastolic (congestive) heart failure (HCC) Active Problems:   Dementia   Essential hypertension   Asthma   GERD   CKD (chronic kidney disease) stage 3, GFR 30-59 ml/min (HCC)   Chronic respiratory failure with hypoxia [facility])  Discharge Instructions: Discharge Instructions    Call MD for:  extreme fatigue   Complete by:  As directed    Call MD for:  persistant dizziness or light-headedness   Complete by:  As directed    Call MD for:  persistant nausea and  vomiting   Complete by:  As directed    Call MD for:  severe uncontrolled pain   Complete by:  As directed    Diet - low sodium heart healthy   Complete by:  As directed    Increase activity slowly   Complete by:  As directed      Allergies as of 10/06/2017      Reactions   Erythromycin Other (See Comments)   Unknown    Fluviral [flu Virus Vaccine]    Penicillins Other (See Comments)   .Has patient had a PCN reaction causing immediate rash,  facial/tongue/throat swelling, SOB or lightheadedness with hypotension: Unknown Has patient had a PCN reaction causing severe rash involving mucus membranes or skin necrosis:No Has patient had a PCN reaction that required hospitalization: Yes Has patient had a PCN reaction occurring within the last 10 years: No If all of the above answers are "NO", then may proceed with Cephalosporin use.      Medication List    TAKE these medications   albuterol (2.5 MG/3ML) 0.083% nebulizer solution Commonly known as:  PROVENTIL Take 2.5 mg by nebulization every 6 (six) hours as needed for wheezing.   albuterol 108 (90 Base) MCG/ACT inhaler Commonly known as:  PROVENTIL HFA;VENTOLIN HFA Inhale 2 puffs into the lungs every 6 (six) hours as needed for wheezing.   aspirin 81 MG tablet Take 81 mg by mouth daily.   atorvastatin 20 MG tablet Commonly known as:  LIPITOR Take 20 mg by mouth daily at 6 PM.   benazepril 5 MG tablet Commonly known as:  LOTENSIN Take 0.5 tablets (2.5 mg total) by mouth daily. Start taking on:  10/07/2017 What changed:  how much to take   docusate sodium 100 MG capsule Commonly known as:  COLACE Take 200 mg by mouth at bedtime.   donepezil 10 MG tablet Commonly known as:  ARICEPT Take 10 mg by mouth at bedtime.   famotidine 20 MG tablet Commonly known as:  PEPCID Take 20 mg by mouth daily.   mirtazapine 30 MG tablet Commonly known as:  REMERON Take 30 mg by mouth at bedtime.   mometasone-formoterol 200-5 MCG/ACT Aero Commonly known as:  DULERA Inhale 2 puffs into the lungs 2 (two) times daily.   multivitamin with minerals Tabs tablet Take 1 tablet by mouth daily.   oxybutynin 5 MG tablet Commonly known as:  DITROPAN Take 5 mg by mouth daily.   potassium chloride SA 20 MEQ tablet Commonly known as:  K-DUR,KLOR-CON Take 3 tablets (60 mEq total) by mouth daily.   torsemide 20 MG tablet Commonly known as:  DEMADEX TAKE 60 MG ( 3 TABLETS) IN THE AM AND  40 MG (2 TABLET) IN THE PM Start taking on:  10/07/2017 What changed:  additional instructions   traMADol 50 MG tablet Commonly known as:  ULTRAM Take 50 mg by mouth 3 (three) times daily as needed for pain (Takes one tablet every morning and evening.).      Follow-up Information    Avon Gully, MD. Schedule an appointment as soon as possible for a visit in 1 week(s).   Specialty:  Internal Medicine Why:  Hospital Follow Up  Contact information: [address] [place] [address] [phone]        Antoine Poche, MD. Schedule an appointment as soon as possible for a visit in 3 week(s).   Specialty:  Cardiology Contact information: [address] [place] [address] [phone]  Allergies  Allergen Reactions  . Erythromycin Other (See Comments)    Unknown   . Fluviral [Flu Virus Vaccine]   . Penicillins Other (See Comments)    .Has patient had a PCN reaction causing immediate rash, facial/tongue/throat swelling, SOB or lightheadedness with hypotension: Unknown Has patient had a PCN reaction causing severe rash involving mucus membranes or skin necrosis:No Has patient had a PCN reaction that required hospitalization: Yes Has patient had a PCN reaction occurring within the last 10 years: No If all of the above answers are "NO", then may proceed with Cephalosporin use.    Allergies as of 10/06/2017      Reactions   Erythromycin Other (See Comments)   Unknown    Fluviral [flu Virus Vaccine]    Penicillins Other (See Comments)   .Has patient had a PCN reaction causing immediate rash, facial/tongue/throat swelling, SOB or lightheadedness with hypotension: Unknown Has patient had a PCN reaction causing severe rash involving mucus membranes or skin necrosis:No Has patient had a PCN reaction that required hospitalization: Yes Has patient had a PCN reaction occurring within the last 10 years: No If all of the above answers are "NO", then may  proceed with Cephalosporin use.      Medication List    TAKE these medications   albuterol (2.5 MG/3ML) 0.083% nebulizer solution Commonly known as:  PROVENTIL Take 2.5 mg by nebulization every 6 (six) hours as needed for wheezing.   albuterol 108 (90 Base) MCG/ACT inhaler Commonly known as:  PROVENTIL HFA;VENTOLIN HFA Inhale 2 puffs into the lungs every 6 (six) hours as needed for wheezing.   aspirin 81 MG tablet Take 81 mg by mouth daily.   atorvastatin 20 MG tablet Commonly known as:  LIPITOR Take 20 mg by mouth daily at 6 PM.   benazepril 5 MG tablet Commonly known as:  LOTENSIN Take 0.5 tablets (2.5 mg total) by mouth daily. Start taking on:  10/07/2017 What changed:  how much to take   docusate sodium 100 MG capsule Commonly known as:  COLACE Take 200 mg by mouth at bedtime.   donepezil 10 MG tablet Commonly known as:  ARICEPT Take 10 mg by mouth at bedtime.   famotidine 20 MG tablet Commonly known as:  PEPCID Take 20 mg by mouth daily.   mirtazapine 30 MG tablet Commonly known as:  REMERON Take 30 mg by mouth at bedtime.   mometasone-formoterol 200-5 MCG/ACT Aero Commonly known as:  DULERA Inhale 2 puffs into the lungs 2 (two) times daily.   multivitamin with minerals Tabs tablet Take 1 tablet by mouth daily.   oxybutynin 5 MG tablet Commonly known as:  DITROPAN Take 5 mg by mouth daily.   potassium chloride SA 20 MEQ tablet Commonly known as:  K-DUR,KLOR-CON Take 3 tablets (60 mEq total) by mouth daily.   torsemide 20 MG tablet Commonly known as:  DEMADEX TAKE 60 MG ( 3 TABLETS) IN THE AM AND 40 MG (2 TABLET) IN THE PM Start taking on:  10/07/2017 What changed:  additional instructions   traMADol 50 MG tablet Commonly known as:  ULTRAM Take 50 mg by mouth 3 (three) times daily as needed for pain (Takes one tablet every morning and evening.).       Procedures/Studies: Dg Chest Port 1 View  Result Date: 10/01/2017 CLINICAL DATA:  Shortness of  breath for least 1 week. Congestive heart failure. EXAM: PORTABLE CHEST 1 VIEW COMPARISON:  Radiographs 06/23/2017.  CT 10/03/2015. FINDINGS: 1152 hours. Stable  cardiomegaly and aortic atherosclerosis. The pulmonary vascularity is normal. Mild chronic interstitial prominence and pleural thickening. No edema, confluent airspace opacity or significant pleural effusion. Glenohumeral degenerative changes are present bilaterally. There are degenerative changes throughout the spine. Telemetry leads overlie the chest. IMPRESSION: Stable cardiomegaly.  No edema or acute findings seen. Electronically Signed   By: Carey Bullocks M.D.   On: 10/01/2017 12:12     Subjective: Pt says that she is feeling better.  She feels stable to go home today.  She has been ambulating with PT and agrees to home health PT.    Discharge Exam: Vitals:   10/06/17 1030 10/06/17 1300  BP:  114/76  Pulse:  64  Resp:  18  Temp: 98.9 F (37.2 C) 98.4 F (36.9 C)  SpO2:  99%   Vitals:   10/06/17 0818 10/06/17 0943 10/06/17 1030 10/06/17 1300  BP: 110/83   114/76  Pulse: 63   64  Resp: 17   18  Temp:   98.9 F (37.2 C) 98.4 F (36.9 C)  TempSrc:   Oral Oral  SpO2: 98% 98%  99%  Weight:      Height:       General exam: Alert, awake, oriented x 3 Respiratory system: No rales heard. Rare crackles.  Respiratory effort normal. Cardiovascular system: normal s1,s2 sounds. No murmurs, rubs, gallops. Gastrointestinal system: Abdomen is nondistended, soft and nontender. No organomegaly or masses felt. Normal bowel sounds heard. Central nervous system: Alert and oriented. No focal neurological deficits. Extremities:  tender lower extremities bilateral. Skin: No rashes, lesions or ulcers Psychiatry: Judgement and insight appear normal. Mood & affect appropriate.   The results of significant diagnostics from this hospitalization (including imaging, microbiology, ancillary and laboratory) are listed below for reference.      Microbiology: No results found for this or any previous visit (from the past 240 hour(s)).   Labs: BNP (last 3 results) Recent Labs    06/23/17 1259 10/01/17 1225  BNP 749.0* 1,079.0*   Basic Metabolic Panel: Recent Labs  Lab 10/02/17 0508 10/03/17 0701 10/04/17 0601 10/05/17 0546 10/06/17 0603  NA 144 141 143 141 139  K 3.8 3.6 3.4* 3.7 3.2*  CL 96* 96* 91* 87* 82*  CO2 38* 42* 44* 48* 48*  GLUCOSE 91 103* 96 94 93  BUN 29* 25* 24* 29* 37*  CREATININE 1.22* 1.11* 1.04* 1.26* 1.24*  CALCIUM 8.8* 8.4* 8.6* 8.9 8.8*  MG  --   --   --  2.0  --    Liver Function Tests: Recent Labs  Lab 10/01/17 1225  AST 50*  ALT 38  ALKPHOS 87  BILITOT 1.1  PROT 7.1  ALBUMIN 3.6   No results for input(s): LIPASE, AMYLASE in the last 168 hours. No results for input(s): AMMONIA in the last 168 hours. CBC: Recent Labs  Lab 10/01/17 1225  WBC 4.9  NEUTROABS 3.7  HGB 11.5*  HCT 35.5*  MCV 90.3  PLT 88*   Cardiac Enzymes: Recent Labs  Lab 10/01/17 1225 10/01/17 1537 10/02/17 0508  TROPONINI 0.14* 0.16* 0.18*   BNP: Invalid input(s): POCBNP CBG: Recent Labs  Lab 10/02/17 2134  GLUCAP 136*   D-Dimer No results for input(s): DDIMER in the last 72 hours. Hgb A1c No results for input(s): HGBA1C in the last 72 hours. Lipid Profile No results for input(s): CHOL, HDL, LDLCALC, TRIG, CHOLHDL, LDLDIRECT in the last 72 hours. Thyroid function studies No results for input(s): TSH, T4TOTAL, T3FREE, THYROIDAB in  the last 72 hours.  Invalid input(s): FREET3 Anemia work up No results for input(s): VITAMINB12, FOLATE, FERRITIN, TIBC, IRON, RETICCTPCT in the last 72 hours. Urinalysis    Component Value Date/Time   COLORURINE YELLOW 02/28/2013 0656   APPEARANCEUR CLEAR 02/28/2013 0656   LABSPEC 1.008 02/28/2013 0656   PHURINE 5.0 02/28/2013 0656   GLUCOSEU NEGATIVE 02/28/2013 0656   HGBUR MODERATE (A) 02/28/2013 0656   BILIRUBINUR NEGATIVE 02/28/2013 0656    KETONESUR NEGATIVE 02/28/2013 0656   PROTEINUR 30 (A) 02/28/2013 0656   UROBILINOGEN 1.0 02/28/2013 0656   NITRITE NEGATIVE 02/28/2013 0656   LEUKOCYTESUR SMALL (A) 02/28/2013 0656   Sepsis Labs Invalid input(s): PROCALCITONIN,  WBC,  LACTICIDVEN Microbiology No results found for this or any previous visit (from the past 240 hour(s)).  Time coordinating discharge: 34 minutes  SIGNED:  Standley Dakins, MD  [facility] 10/06/2017, 2:52 PM Pager [phone]  If 7PM-7AM, please contact night-coverage [email or web address] Password [email or web address]

## 2017-10-06 NOTE — Progress Notes (Signed)
Physical Therapy Treatment Patient Details Name: Brittney [name] MRN: [id number] DOB: Oct 04, 1929 Today's Date: [name]    History of Present Illness Brittney [name] is a 84 y.o. female with medical history significant of diastolic congestive heart failure, COPD, diabetes, chronic respiratory failure on 3 L of oxygen at home was seen by Dr. Wyline Mood cardiology office today and was found to be fluid overloaded with 12 pound weight gain in 2 weeks.  Patient was referred to the emergency department as her O2 sats there were low on her 3 L of oxygen.  Patient found to be in congestive heart failure exacerbation and referred for admission for such.  She denies any chest pain.  She has been swelling in her legs for several days now and is been having several days of more than normal shortness of breath.  She denies any fevers or cough.  She says she is taking her medications.    PT Comments    Pt laying in bed c/o being cold but no other complaints.  Pt agreeable to completing therapy today.  Pt able to complete bed mobility modified independent due to increased time and stood with walker requiring rocking momentum and use of UE's.  Pt ambulated using 3LO2 approx 40 feet before wanting to return to room, ambulating total of 80 feet.  Upon return, pulse oxygen dropped to 74% and after approx 2 minutes returned to 94%.  Pt requires constant cues to breathe correctly, using diaphragmatic breathing to utilize oxygenation efficiently.  Pt also requires cues with ambulation as tends to display forward bent posturing.  Pt overall safe with transfers and ambulation, just slow, labored and requiring increased time and rest breaks.  Pt returned to chair with alarm in place.  Nursing notified of need to replace pure wick filter and of drop in oxygen with activity.       Follow Up Recommendations  Home health PT;Supervision - Intermittent     Equipment Recommendations  None recommended by PT    Recommendations for  Other Services           Mobility  Bed Mobility Overal bed mobility: Modified Independent                Transfers Overall transfer level: Modified independent Equipment used: Rolling walker (2 wheeled) Transfers: Sit to/from Stand Sit to Stand: Modified independent (Device/Increase time)         General transfer comment: pt able to stand following rocking momentum and use of UE's  Ambulation/Gait Ambulation/Gait assistance: Modified independent (Device/Increase time) Ambulation Distance (Feet): 80 Feet Assistive device: Rolling walker (2 wheeled) Gait Pattern/deviations: Decreased step length - right;Decreased step length - left;Decreased stride length;Trunk flexed Gait velocity: slow   General Gait Details: slow labored cadence without loss of balance, limited secondary to c/o fatigue/SOB while on 3 LPM O2, became fatigued having to stop and SPO2 dropped to 74%        Cognition Arousal/Alertness: Awake/alert Behavior During Therapy: WFL for tasks assessed/performed Overall Cognitive Status: Within Functional Limits for tasks assessed                                                  Pertinent Vitals/Pain Pain Assessment: No/denies pain           PT Goals (current goals can now be found in the care plan  section) Progress towards PT goals: Progressing toward goals    Frequency    Min 3X/week      PT Plan Current plan remains appropriate       [name]-PAC PT "6 Clicks" Daily Activity  Outcome Measure  Difficulty turning over in bed (including adjusting bedclothes, sheets and blankets)?: None Difficulty moving from lying on back to sitting on the side of the bed? : None Difficulty sitting down on and standing up from a chair with arms (e.g., wheelchair, bedside commode, etc,.)?: A Little Help needed moving to and from a bed to chair (including a wheelchair)?: A Little Help needed walking in hospital room?: A Little Help needed  climbing 3-5 steps with a railing? : A Little 6 Click Score: 20    End of Session Equipment Utilized During Treatment: Gait belt;Oxygen Activity Tolerance: Patient tolerated treatment well;Patient limited by fatigue Patient left: in chair;with call bell/phone within reach;with chair alarm set Nurse Communication: Mobility status(need to replace pure wick, chair alarm pad ) PT Visit Diagnosis: Unsteadiness on feet (R26.81);Other abnormalities of gait and mobility (R26.89);Muscle weakness (generalized) (M62.81)     Time: 6213-0865 PT Time Calculation (min) (ACUTE ONLY): 35 min  Charges:  $Gait Training: 8-22 mins $Therapeutic Activity: 8-22 mins             Lurena Nida, PTA/CLT [phone]    Brittney [name], Brittney [name], Brittney [name]

## 2017-10-06 NOTE — Discharge Instructions (Signed)
Follow with Primary MD  Fanta, Tesfaye, MD  and other consultant's as instructed your Hospitalist MD ° °Please get a complete blood count and chemistry panel checked by your Primary MD at your next visit, and again as instructed by your Primary MD. ° °Get Medicines reviewed and adjusted: °Please take all your medications with you for your next visit with your Primary MD ° °Laboratory/radiological data: °Please request your Primary MD to go over all hospital tests and procedure/radiological results at the follow up, please ask your Primary MD to get all Hospital records sent to his/her office. ° °In some cases, they will be blood work, cultures and biopsy results pending at the time of your discharge. Please request that your primary care M.D. follows up on these results. ° °Also Note the following: °If you experience worsening of your admission symptoms, develop shortness of breath, life threatening emergency, suicidal or homicidal thoughts you must seek medical attention immediately by calling 911 or calling your MD immediately  if symptoms less severe. ° °You must read complete instructions/literature along with all the possible adverse reactions/side effects for all the Medicines you take and that have been prescribed to you. Take any new Medicines after you have completely understood and accpet all the possible adverse reactions/side effects.  ° °Do not drive when taking Pain medications or sleeping medications (Benzodaizepines) ° °Do not take more than prescribed Pain, Sleep and Anxiety Medications. It is not advisable to combine anxiety,sleep and pain medications without talking with your primary care practitioner ° °Special Instructions: If you have smoked or chewed Tobacco  in the last 2 yrs please stop smoking, stop any regular Alcohol  and or any Recreational drug use. ° °Wear Seat belts while driving. ° °Please note: °You were cared for by a hospitalist during your hospital stay. Once you are discharged,  your primary care physician will handle any further medical issues. Please note that NO REFILLS for any discharge medications will be authorized once you are discharged, as it is imperative that you return to your primary care physician (or establish a relationship with a primary care physician if you do not have one) for your post hospital discharge needs so that they can reassess your need for medications and monitor your lab values. ° ° ° ° °

## 2017-10-06 NOTE — Care Management Note (Signed)
Case Management Note  Patient Details  Name: Brittney [name] MRN: [id number] Date of Birth: 27-Jun-1929  Expected Discharge Date:  10/06/17               Expected Discharge Plan:  Home w Home Health Services  In-House Referral:  NA  Discharge planning Services  CM Consult  Post Acute Care Choice:  Home Health, Resumption of Svcs/PTA Provider Choice offered to:  Patient  HH Arranged:  RN, PT [facility] Agency:  [facility]  Status of Service:  Completed, signed off  If discussed at Long Length of Stay Meetings, dates discussed:    Additional Comments: Per [facility] rep, pt active [facility]. CM has contacted [facility] who confirms they are active with [facility] services. They will pull pt info from chart. Pt aware HH has 48 hrs to make resumption visit.   [name], RN 10/06/2017, 2:56 PM

## 2017-10-07 DIAGNOSIS — I509 Heart failure, unspecified: Secondary | ICD-10-CM | POA: Diagnosis not present

## 2017-10-07 DIAGNOSIS — R262 Difficulty in walking, not elsewhere classified: Secondary | ICD-10-CM | POA: Diagnosis not present

## 2017-10-07 DIAGNOSIS — M6281 Muscle weakness (generalized): Secondary | ICD-10-CM | POA: Diagnosis not present

## 2017-10-07 DIAGNOSIS — I5031 Acute diastolic (congestive) heart failure: Secondary | ICD-10-CM | POA: Diagnosis not present

## 2017-10-07 DIAGNOSIS — N183 Chronic kidney disease, stage 3 (moderate): Secondary | ICD-10-CM | POA: Diagnosis not present

## 2017-10-07 DIAGNOSIS — J449 Chronic obstructive pulmonary disease, unspecified: Secondary | ICD-10-CM | POA: Diagnosis not present

## 2017-10-08 DIAGNOSIS — R262 Difficulty in walking, not elsewhere classified: Secondary | ICD-10-CM | POA: Diagnosis not present

## 2017-10-08 DIAGNOSIS — I5031 Acute diastolic (congestive) heart failure: Secondary | ICD-10-CM | POA: Diagnosis not present

## 2017-10-08 DIAGNOSIS — J449 Chronic obstructive pulmonary disease, unspecified: Secondary | ICD-10-CM | POA: Diagnosis not present

## 2017-10-08 DIAGNOSIS — I509 Heart failure, unspecified: Secondary | ICD-10-CM | POA: Diagnosis not present

## 2017-10-08 DIAGNOSIS — M6281 Muscle weakness (generalized): Secondary | ICD-10-CM | POA: Diagnosis not present

## 2017-10-08 DIAGNOSIS — N183 Chronic kidney disease, stage 3 (moderate): Secondary | ICD-10-CM | POA: Diagnosis not present

## 2017-10-10 DIAGNOSIS — R32 Unspecified urinary incontinence: Secondary | ICD-10-CM | POA: Diagnosis not present

## 2017-10-10 DIAGNOSIS — R262 Difficulty in walking, not elsewhere classified: Secondary | ICD-10-CM | POA: Diagnosis not present

## 2017-10-10 DIAGNOSIS — I10 Essential (primary) hypertension: Secondary | ICD-10-CM | POA: Diagnosis not present

## 2017-10-10 DIAGNOSIS — J449 Chronic obstructive pulmonary disease, unspecified: Secondary | ICD-10-CM | POA: Diagnosis not present

## 2017-10-10 DIAGNOSIS — I5031 Acute diastolic (congestive) heart failure: Secondary | ICD-10-CM | POA: Diagnosis not present

## 2017-10-10 DIAGNOSIS — N183 Chronic kidney disease, stage 3 (moderate): Secondary | ICD-10-CM | POA: Diagnosis not present

## 2017-10-10 DIAGNOSIS — M6281 Muscle weakness (generalized): Secondary | ICD-10-CM | POA: Diagnosis not present

## 2017-10-10 DIAGNOSIS — I509 Heart failure, unspecified: Secondary | ICD-10-CM | POA: Diagnosis not present

## 2017-10-10 DIAGNOSIS — F29 Unspecified psychosis not due to a substance or known physiological condition: Secondary | ICD-10-CM | POA: Diagnosis not present

## 2017-10-11 ENCOUNTER — Other Ambulatory Visit: Payer: Self-pay

## 2017-10-11 NOTE — Patient Outreach (Signed)
[facility]) Care Management  10/11/2017  AXELLE SZWED Jul 21, 1929 [id number]   EMMI- General Discharge RED ON EMMI ALERT Day # Date:  Red Alert Reason:   Outreach attempt # 1 to patient.  Asked patient to verify HIPAA.  Patient asked for what.  Explained to patient it is to protect her identity to make sure that CM is speaking to the right person. Patient only gave her address.  Advised patient that CM could not talk with her without verification.  Patient states she is busy.  Offered patient to call her back at a better time.  Patients no and that she wants to get it over with.  Patient states she left the hospital and someone has been calling her everyday and she wants to get some rest and be left alone.  Asked patient if she wanted to receive further calls.  She states no.  Advised patient that CM would have her removed from automated calls.  She verbalized understanding  Plan: RN CM will notify CMA of removal of calls. RN CM will close case.  Bary Leriche, RN, MSN [facility] Care Management Care Management Coordinator Direct Line [phone] Toll Free: [phone]  Fax: [phone]

## 2017-10-12 DIAGNOSIS — J9611 Chronic respiratory failure with hypoxia: Secondary | ICD-10-CM | POA: Diagnosis not present

## 2017-10-12 DIAGNOSIS — I10 Essential (primary) hypertension: Secondary | ICD-10-CM | POA: Diagnosis not present

## 2017-10-12 DIAGNOSIS — G301 Alzheimer's disease with late onset: Secondary | ICD-10-CM | POA: Diagnosis not present

## 2017-10-12 DIAGNOSIS — I5033 Acute on chronic diastolic (congestive) heart failure: Secondary | ICD-10-CM | POA: Diagnosis not present

## 2017-10-13 DIAGNOSIS — R262 Difficulty in walking, not elsewhere classified: Secondary | ICD-10-CM | POA: Diagnosis not present

## 2017-10-13 DIAGNOSIS — I5031 Acute diastolic (congestive) heart failure: Secondary | ICD-10-CM | POA: Diagnosis not present

## 2017-10-13 DIAGNOSIS — I10 Essential (primary) hypertension: Secondary | ICD-10-CM | POA: Diagnosis not present

## 2017-10-13 DIAGNOSIS — M6281 Muscle weakness (generalized): Secondary | ICD-10-CM | POA: Diagnosis not present

## 2017-10-13 DIAGNOSIS — I509 Heart failure, unspecified: Secondary | ICD-10-CM | POA: Diagnosis not present

## 2017-10-13 DIAGNOSIS — J449 Chronic obstructive pulmonary disease, unspecified: Secondary | ICD-10-CM | POA: Diagnosis not present

## 2017-10-18 DIAGNOSIS — I5031 Acute diastolic (congestive) heart failure: Secondary | ICD-10-CM | POA: Diagnosis not present

## 2017-10-18 DIAGNOSIS — I10 Essential (primary) hypertension: Secondary | ICD-10-CM | POA: Diagnosis not present

## 2017-10-18 DIAGNOSIS — R262 Difficulty in walking, not elsewhere classified: Secondary | ICD-10-CM | POA: Diagnosis not present

## 2017-10-18 DIAGNOSIS — J449 Chronic obstructive pulmonary disease, unspecified: Secondary | ICD-10-CM | POA: Diagnosis not present

## 2017-10-18 DIAGNOSIS — M6281 Muscle weakness (generalized): Secondary | ICD-10-CM | POA: Diagnosis not present

## 2017-10-18 DIAGNOSIS — I509 Heart failure, unspecified: Secondary | ICD-10-CM | POA: Diagnosis not present

## 2017-10-20 DIAGNOSIS — J449 Chronic obstructive pulmonary disease, unspecified: Secondary | ICD-10-CM | POA: Diagnosis not present

## 2017-10-20 DIAGNOSIS — I10 Essential (primary) hypertension: Secondary | ICD-10-CM | POA: Diagnosis not present

## 2017-10-20 DIAGNOSIS — M6281 Muscle weakness (generalized): Secondary | ICD-10-CM | POA: Diagnosis not present

## 2017-10-20 DIAGNOSIS — R262 Difficulty in walking, not elsewhere classified: Secondary | ICD-10-CM | POA: Diagnosis not present

## 2017-10-20 DIAGNOSIS — I509 Heart failure, unspecified: Secondary | ICD-10-CM | POA: Diagnosis not present

## 2017-10-20 DIAGNOSIS — I5031 Acute diastolic (congestive) heart failure: Secondary | ICD-10-CM | POA: Diagnosis not present

## 2017-10-21 DIAGNOSIS — J449 Chronic obstructive pulmonary disease, unspecified: Secondary | ICD-10-CM | POA: Diagnosis not present

## 2017-10-21 DIAGNOSIS — R262 Difficulty in walking, not elsewhere classified: Secondary | ICD-10-CM | POA: Diagnosis not present

## 2017-10-21 DIAGNOSIS — I509 Heart failure, unspecified: Secondary | ICD-10-CM | POA: Diagnosis not present

## 2017-10-21 DIAGNOSIS — M6281 Muscle weakness (generalized): Secondary | ICD-10-CM | POA: Diagnosis not present

## 2017-10-21 DIAGNOSIS — I10 Essential (primary) hypertension: Secondary | ICD-10-CM | POA: Diagnosis not present

## 2017-10-21 DIAGNOSIS — I5031 Acute diastolic (congestive) heart failure: Secondary | ICD-10-CM | POA: Diagnosis not present

## 2017-10-27 DIAGNOSIS — R262 Difficulty in walking, not elsewhere classified: Secondary | ICD-10-CM | POA: Diagnosis not present

## 2017-10-27 DIAGNOSIS — J449 Chronic obstructive pulmonary disease, unspecified: Secondary | ICD-10-CM | POA: Diagnosis not present

## 2017-10-27 DIAGNOSIS — I509 Heart failure, unspecified: Secondary | ICD-10-CM | POA: Diagnosis not present

## 2017-10-27 DIAGNOSIS — I5031 Acute diastolic (congestive) heart failure: Secondary | ICD-10-CM | POA: Diagnosis not present

## 2017-10-27 DIAGNOSIS — M6281 Muscle weakness (generalized): Secondary | ICD-10-CM | POA: Diagnosis not present

## 2017-10-27 DIAGNOSIS — I10 Essential (primary) hypertension: Secondary | ICD-10-CM | POA: Diagnosis not present

## 2017-10-29 DIAGNOSIS — I509 Heart failure, unspecified: Secondary | ICD-10-CM | POA: Diagnosis not present

## 2017-10-29 DIAGNOSIS — I5031 Acute diastolic (congestive) heart failure: Secondary | ICD-10-CM | POA: Diagnosis not present

## 2017-10-29 DIAGNOSIS — M6281 Muscle weakness (generalized): Secondary | ICD-10-CM | POA: Diagnosis not present

## 2017-10-29 DIAGNOSIS — J449 Chronic obstructive pulmonary disease, unspecified: Secondary | ICD-10-CM | POA: Diagnosis not present

## 2017-10-29 DIAGNOSIS — R262 Difficulty in walking, not elsewhere classified: Secondary | ICD-10-CM | POA: Diagnosis not present

## 2017-10-29 DIAGNOSIS — I10 Essential (primary) hypertension: Secondary | ICD-10-CM | POA: Diagnosis not present

## 2017-11-03 DIAGNOSIS — I5031 Acute diastolic (congestive) heart failure: Secondary | ICD-10-CM | POA: Diagnosis not present

## 2017-11-03 DIAGNOSIS — J449 Chronic obstructive pulmonary disease, unspecified: Secondary | ICD-10-CM | POA: Diagnosis not present

## 2017-11-03 DIAGNOSIS — I10 Essential (primary) hypertension: Secondary | ICD-10-CM | POA: Diagnosis not present

## 2017-11-03 DIAGNOSIS — M6281 Muscle weakness (generalized): Secondary | ICD-10-CM | POA: Diagnosis not present

## 2017-11-03 DIAGNOSIS — R262 Difficulty in walking, not elsewhere classified: Secondary | ICD-10-CM | POA: Diagnosis not present

## 2017-11-03 DIAGNOSIS — I509 Heart failure, unspecified: Secondary | ICD-10-CM | POA: Diagnosis not present

## 2017-11-05 DIAGNOSIS — I10 Essential (primary) hypertension: Secondary | ICD-10-CM | POA: Diagnosis not present

## 2017-11-05 DIAGNOSIS — R262 Difficulty in walking, not elsewhere classified: Secondary | ICD-10-CM | POA: Diagnosis not present

## 2017-11-05 DIAGNOSIS — I5031 Acute diastolic (congestive) heart failure: Secondary | ICD-10-CM | POA: Diagnosis not present

## 2017-11-05 DIAGNOSIS — J449 Chronic obstructive pulmonary disease, unspecified: Secondary | ICD-10-CM | POA: Diagnosis not present

## 2017-11-05 DIAGNOSIS — I509 Heart failure, unspecified: Secondary | ICD-10-CM | POA: Diagnosis not present

## 2017-11-05 DIAGNOSIS — M6281 Muscle weakness (generalized): Secondary | ICD-10-CM | POA: Diagnosis not present

## 2017-11-10 DIAGNOSIS — I5031 Acute diastolic (congestive) heart failure: Secondary | ICD-10-CM | POA: Diagnosis not present

## 2017-11-10 DIAGNOSIS — I509 Heart failure, unspecified: Secondary | ICD-10-CM | POA: Diagnosis not present

## 2017-11-10 DIAGNOSIS — I10 Essential (primary) hypertension: Secondary | ICD-10-CM | POA: Diagnosis not present

## 2017-11-10 DIAGNOSIS — R262 Difficulty in walking, not elsewhere classified: Secondary | ICD-10-CM | POA: Diagnosis not present

## 2017-11-10 DIAGNOSIS — M6281 Muscle weakness (generalized): Secondary | ICD-10-CM | POA: Diagnosis not present

## 2017-11-10 DIAGNOSIS — J449 Chronic obstructive pulmonary disease, unspecified: Secondary | ICD-10-CM | POA: Diagnosis not present

## 2017-11-12 DIAGNOSIS — I10 Essential (primary) hypertension: Secondary | ICD-10-CM | POA: Diagnosis not present

## 2017-11-12 DIAGNOSIS — I5031 Acute diastolic (congestive) heart failure: Secondary | ICD-10-CM | POA: Diagnosis not present

## 2017-11-12 DIAGNOSIS — I5032 Chronic diastolic (congestive) heart failure: Secondary | ICD-10-CM | POA: Diagnosis not present

## 2017-11-12 DIAGNOSIS — M6281 Muscle weakness (generalized): Secondary | ICD-10-CM | POA: Diagnosis not present

## 2017-11-12 DIAGNOSIS — R262 Difficulty in walking, not elsewhere classified: Secondary | ICD-10-CM | POA: Diagnosis not present

## 2017-11-12 DIAGNOSIS — J449 Chronic obstructive pulmonary disease, unspecified: Secondary | ICD-10-CM | POA: Diagnosis not present

## 2017-11-12 DIAGNOSIS — I509 Heart failure, unspecified: Secondary | ICD-10-CM | POA: Diagnosis not present

## 2017-11-17 DIAGNOSIS — R262 Difficulty in walking, not elsewhere classified: Secondary | ICD-10-CM | POA: Diagnosis not present

## 2017-11-17 DIAGNOSIS — I5031 Acute diastolic (congestive) heart failure: Secondary | ICD-10-CM | POA: Diagnosis not present

## 2017-11-17 DIAGNOSIS — I509 Heart failure, unspecified: Secondary | ICD-10-CM | POA: Diagnosis not present

## 2017-11-17 DIAGNOSIS — M6281 Muscle weakness (generalized): Secondary | ICD-10-CM | POA: Diagnosis not present

## 2017-11-17 DIAGNOSIS — J449 Chronic obstructive pulmonary disease, unspecified: Secondary | ICD-10-CM | POA: Diagnosis not present

## 2017-11-17 DIAGNOSIS — I10 Essential (primary) hypertension: Secondary | ICD-10-CM | POA: Diagnosis not present

## 2017-11-18 DIAGNOSIS — I5031 Acute diastolic (congestive) heart failure: Secondary | ICD-10-CM | POA: Diagnosis not present

## 2017-11-18 DIAGNOSIS — J449 Chronic obstructive pulmonary disease, unspecified: Secondary | ICD-10-CM | POA: Diagnosis not present

## 2017-11-18 DIAGNOSIS — I10 Essential (primary) hypertension: Secondary | ICD-10-CM | POA: Diagnosis not present

## 2017-11-18 DIAGNOSIS — M6281 Muscle weakness (generalized): Secondary | ICD-10-CM | POA: Diagnosis not present

## 2017-11-18 DIAGNOSIS — I509 Heart failure, unspecified: Secondary | ICD-10-CM | POA: Diagnosis not present

## 2017-11-18 DIAGNOSIS — R262 Difficulty in walking, not elsewhere classified: Secondary | ICD-10-CM | POA: Diagnosis not present

## 2017-11-19 DIAGNOSIS — I10 Essential (primary) hypertension: Secondary | ICD-10-CM | POA: Diagnosis not present

## 2017-11-19 DIAGNOSIS — I509 Heart failure, unspecified: Secondary | ICD-10-CM | POA: Diagnosis not present

## 2017-11-19 DIAGNOSIS — M6281 Muscle weakness (generalized): Secondary | ICD-10-CM | POA: Diagnosis not present

## 2017-11-19 DIAGNOSIS — I5031 Acute diastolic (congestive) heart failure: Secondary | ICD-10-CM | POA: Diagnosis not present

## 2017-11-19 DIAGNOSIS — J449 Chronic obstructive pulmonary disease, unspecified: Secondary | ICD-10-CM | POA: Diagnosis not present

## 2017-11-19 DIAGNOSIS — R262 Difficulty in walking, not elsewhere classified: Secondary | ICD-10-CM | POA: Diagnosis not present

## 2017-11-24 DIAGNOSIS — R262 Difficulty in walking, not elsewhere classified: Secondary | ICD-10-CM | POA: Diagnosis not present

## 2017-11-24 DIAGNOSIS — J449 Chronic obstructive pulmonary disease, unspecified: Secondary | ICD-10-CM | POA: Diagnosis not present

## 2017-11-24 DIAGNOSIS — I509 Heart failure, unspecified: Secondary | ICD-10-CM | POA: Diagnosis not present

## 2017-11-24 DIAGNOSIS — I10 Essential (primary) hypertension: Secondary | ICD-10-CM | POA: Diagnosis not present

## 2017-11-24 DIAGNOSIS — M6281 Muscle weakness (generalized): Secondary | ICD-10-CM | POA: Diagnosis not present

## 2017-11-24 DIAGNOSIS — I5031 Acute diastolic (congestive) heart failure: Secondary | ICD-10-CM | POA: Diagnosis not present

## 2017-11-26 DIAGNOSIS — I509 Heart failure, unspecified: Secondary | ICD-10-CM | POA: Diagnosis not present

## 2017-11-26 DIAGNOSIS — I10 Essential (primary) hypertension: Secondary | ICD-10-CM | POA: Diagnosis not present

## 2017-11-26 DIAGNOSIS — J449 Chronic obstructive pulmonary disease, unspecified: Secondary | ICD-10-CM | POA: Diagnosis not present

## 2017-11-26 DIAGNOSIS — I5031 Acute diastolic (congestive) heart failure: Secondary | ICD-10-CM | POA: Diagnosis not present

## 2017-11-26 DIAGNOSIS — R262 Difficulty in walking, not elsewhere classified: Secondary | ICD-10-CM | POA: Diagnosis not present

## 2017-11-26 DIAGNOSIS — M6281 Muscle weakness (generalized): Secondary | ICD-10-CM | POA: Diagnosis not present

## 2017-12-01 DIAGNOSIS — J449 Chronic obstructive pulmonary disease, unspecified: Secondary | ICD-10-CM | POA: Diagnosis not present

## 2017-12-01 DIAGNOSIS — R262 Difficulty in walking, not elsewhere classified: Secondary | ICD-10-CM | POA: Diagnosis not present

## 2017-12-01 DIAGNOSIS — I5031 Acute diastolic (congestive) heart failure: Secondary | ICD-10-CM | POA: Diagnosis not present

## 2017-12-01 DIAGNOSIS — I10 Essential (primary) hypertension: Secondary | ICD-10-CM | POA: Diagnosis not present

## 2017-12-01 DIAGNOSIS — M6281 Muscle weakness (generalized): Secondary | ICD-10-CM | POA: Diagnosis not present

## 2017-12-01 DIAGNOSIS — I509 Heart failure, unspecified: Secondary | ICD-10-CM | POA: Diagnosis not present

## 2017-12-03 DIAGNOSIS — I509 Heart failure, unspecified: Secondary | ICD-10-CM | POA: Diagnosis not present

## 2017-12-03 DIAGNOSIS — R262 Difficulty in walking, not elsewhere classified: Secondary | ICD-10-CM | POA: Diagnosis not present

## 2017-12-03 DIAGNOSIS — I5031 Acute diastolic (congestive) heart failure: Secondary | ICD-10-CM | POA: Diagnosis not present

## 2017-12-03 DIAGNOSIS — J449 Chronic obstructive pulmonary disease, unspecified: Secondary | ICD-10-CM | POA: Diagnosis not present

## 2017-12-03 DIAGNOSIS — I10 Essential (primary) hypertension: Secondary | ICD-10-CM | POA: Diagnosis not present

## 2017-12-03 DIAGNOSIS — M6281 Muscle weakness (generalized): Secondary | ICD-10-CM | POA: Diagnosis not present

## 2017-12-08 DIAGNOSIS — I509 Heart failure, unspecified: Secondary | ICD-10-CM | POA: Diagnosis not present

## 2017-12-08 DIAGNOSIS — J449 Chronic obstructive pulmonary disease, unspecified: Secondary | ICD-10-CM | POA: Diagnosis not present

## 2017-12-08 DIAGNOSIS — M6281 Muscle weakness (generalized): Secondary | ICD-10-CM | POA: Diagnosis not present

## 2017-12-08 DIAGNOSIS — I10 Essential (primary) hypertension: Secondary | ICD-10-CM | POA: Diagnosis not present

## 2017-12-08 DIAGNOSIS — I5031 Acute diastolic (congestive) heart failure: Secondary | ICD-10-CM | POA: Diagnosis not present

## 2017-12-08 DIAGNOSIS — R262 Difficulty in walking, not elsewhere classified: Secondary | ICD-10-CM | POA: Diagnosis not present

## 2017-12-09 DIAGNOSIS — R262 Difficulty in walking, not elsewhere classified: Secondary | ICD-10-CM | POA: Diagnosis not present

## 2017-12-09 DIAGNOSIS — M6281 Muscle weakness (generalized): Secondary | ICD-10-CM | POA: Diagnosis not present

## 2017-12-09 DIAGNOSIS — I10 Essential (primary) hypertension: Secondary | ICD-10-CM | POA: Diagnosis not present

## 2017-12-09 DIAGNOSIS — N183 Chronic kidney disease, stage 3 (moderate): Secondary | ICD-10-CM | POA: Diagnosis not present

## 2017-12-09 DIAGNOSIS — I5031 Acute diastolic (congestive) heart failure: Secondary | ICD-10-CM | POA: Diagnosis not present

## 2017-12-09 DIAGNOSIS — J449 Chronic obstructive pulmonary disease, unspecified: Secondary | ICD-10-CM | POA: Diagnosis not present

## 2017-12-09 DIAGNOSIS — I509 Heart failure, unspecified: Secondary | ICD-10-CM | POA: Diagnosis not present

## 2017-12-09 DIAGNOSIS — R32 Unspecified urinary incontinence: Secondary | ICD-10-CM | POA: Diagnosis not present

## 2017-12-09 DIAGNOSIS — F29 Unspecified psychosis not due to a substance or known physiological condition: Secondary | ICD-10-CM | POA: Diagnosis not present

## 2017-12-13 ENCOUNTER — Encounter (HOSPITAL_COMMUNITY): Payer: Self-pay

## 2017-12-13 ENCOUNTER — Other Ambulatory Visit: Payer: Self-pay

## 2017-12-13 ENCOUNTER — Inpatient Hospital Stay (HOSPITAL_COMMUNITY)
Admission: EM | Admit: 2017-12-13 | Discharge: 2017-12-17 | DRG: 291 | Disposition: A | Discharge status: Skilled Nursing Facility | Payer: Medicare Other | Attending: Family Medicine | Admitting: Family Medicine

## 2017-12-13 ENCOUNTER — Emergency Department (HOSPITAL_COMMUNITY): Payer: Medicare Other

## 2017-12-13 DIAGNOSIS — R2681 Unsteadiness on feet: Secondary | ICD-10-CM | POA: Diagnosis not present

## 2017-12-13 DIAGNOSIS — R531 Weakness: Secondary | ICD-10-CM | POA: Diagnosis not present

## 2017-12-13 DIAGNOSIS — R41841 Cognitive communication deficit: Secondary | ICD-10-CM | POA: Diagnosis not present

## 2017-12-13 DIAGNOSIS — E1122 Type 2 diabetes mellitus with diabetic chronic kidney disease: Secondary | ICD-10-CM | POA: Diagnosis present

## 2017-12-13 DIAGNOSIS — E119 Type 2 diabetes mellitus without complications: Secondary | ICD-10-CM | POA: Diagnosis not present

## 2017-12-13 DIAGNOSIS — J9611 Chronic respiratory failure with hypoxia: Secondary | ICD-10-CM | POA: Diagnosis not present

## 2017-12-13 DIAGNOSIS — F0390 Unspecified dementia without behavioral disturbance: Secondary | ICD-10-CM | POA: Diagnosis present

## 2017-12-13 DIAGNOSIS — J449 Chronic obstructive pulmonary disease, unspecified: Secondary | ICD-10-CM | POA: Diagnosis present

## 2017-12-13 DIAGNOSIS — J90 Pleural effusion, not elsewhere classified: Secondary | ICD-10-CM | POA: Diagnosis not present

## 2017-12-13 DIAGNOSIS — I5033 Acute on chronic diastolic (congestive) heart failure: Secondary | ICD-10-CM | POA: Diagnosis not present

## 2017-12-13 DIAGNOSIS — J810 Acute pulmonary edema: Secondary | ICD-10-CM

## 2017-12-13 DIAGNOSIS — N183 Chronic kidney disease, stage 3 unspecified: Secondary | ICD-10-CM | POA: Diagnosis present

## 2017-12-13 DIAGNOSIS — I110 Hypertensive heart disease with heart failure: Secondary | ICD-10-CM | POA: Diagnosis not present

## 2017-12-13 DIAGNOSIS — Z7982 Long term (current) use of aspirin: Secondary | ICD-10-CM

## 2017-12-13 DIAGNOSIS — Z87891 Personal history of nicotine dependence: Secondary | ICD-10-CM

## 2017-12-13 DIAGNOSIS — R0602 Shortness of breath: Secondary | ICD-10-CM | POA: Diagnosis not present

## 2017-12-13 DIAGNOSIS — R748 Abnormal levels of other serum enzymes: Secondary | ICD-10-CM | POA: Diagnosis not present

## 2017-12-13 DIAGNOSIS — J9621 Acute and chronic respiratory failure with hypoxia: Secondary | ICD-10-CM | POA: Diagnosis present

## 2017-12-13 DIAGNOSIS — I959 Hypotension, unspecified: Secondary | ICD-10-CM | POA: Diagnosis not present

## 2017-12-13 DIAGNOSIS — Z9981 Dependence on supplemental oxygen: Secondary | ICD-10-CM | POA: Diagnosis not present

## 2017-12-13 DIAGNOSIS — R7989 Other specified abnormal findings of blood chemistry: Secondary | ICD-10-CM | POA: Diagnosis present

## 2017-12-13 DIAGNOSIS — R778 Other specified abnormalities of plasma proteins: Secondary | ICD-10-CM | POA: Diagnosis present

## 2017-12-13 DIAGNOSIS — J811 Chronic pulmonary edema: Secondary | ICD-10-CM | POA: Diagnosis not present

## 2017-12-13 DIAGNOSIS — R262 Difficulty in walking, not elsewhere classified: Secondary | ICD-10-CM | POA: Diagnosis not present

## 2017-12-13 DIAGNOSIS — R404 Transient alteration of awareness: Secondary | ICD-10-CM | POA: Diagnosis not present

## 2017-12-13 DIAGNOSIS — I509 Heart failure, unspecified: Secondary | ICD-10-CM | POA: Diagnosis not present

## 2017-12-13 DIAGNOSIS — E875 Hyperkalemia: Secondary | ICD-10-CM | POA: Diagnosis present

## 2017-12-13 DIAGNOSIS — M6281 Muscle weakness (generalized): Secondary | ICD-10-CM | POA: Diagnosis not present

## 2017-12-13 DIAGNOSIS — R061 Stridor: Secondary | ICD-10-CM | POA: Diagnosis not present

## 2017-12-13 DIAGNOSIS — I10 Essential (primary) hypertension: Secondary | ICD-10-CM | POA: Diagnosis present

## 2017-12-13 DIAGNOSIS — R600 Localized edema: Secondary | ICD-10-CM | POA: Diagnosis not present

## 2017-12-13 DIAGNOSIS — R1312 Dysphagia, oropharyngeal phase: Secondary | ICD-10-CM | POA: Diagnosis not present

## 2017-12-13 DIAGNOSIS — J9811 Atelectasis: Secondary | ICD-10-CM | POA: Diagnosis not present

## 2017-12-13 DIAGNOSIS — I130 Hypertensive heart and chronic kidney disease with heart failure and stage 1 through stage 4 chronic kidney disease, or unspecified chronic kidney disease: Principal | ICD-10-CM | POA: Diagnosis present

## 2017-12-13 DIAGNOSIS — I5031 Acute diastolic (congestive) heart failure: Secondary | ICD-10-CM | POA: Diagnosis not present

## 2017-12-13 DIAGNOSIS — I5030 Unspecified diastolic (congestive) heart failure: Secondary | ICD-10-CM | POA: Diagnosis not present

## 2017-12-13 LAB — BRAIN NATRIURETIC PEPTIDE: B Natriuretic Peptide: 1051 pg/mL — ABNORMAL HIGH (ref 0.0–100.0)

## 2017-12-13 LAB — BASIC METABOLIC PANEL
Anion gap: 5 (ref 5–15)
BUN: 39 mg/dL — AB (ref 8–23)
CHLORIDE: 95 mmol/L — AB (ref 98–111)
CO2: 41 mmol/L — AB (ref 22–32)
Calcium: 8.5 mg/dL — ABNORMAL LOW (ref 8.9–10.3)
Creatinine, Ser: 1.42 mg/dL — ABNORMAL HIGH (ref 0.44–1.00)
GFR calc Af Amer: 38 mL/min — ABNORMAL LOW (ref >60)
GFR, EST NON AFRICAN AMERICAN: 32 mL/min — AB (ref >60)
Glucose, Bld: 96 mg/dL (ref 70–99)
POTASSIUM: 4.7 mmol/L (ref 3.5–5.1)
Sodium: 141 mmol/L (ref 135–145)

## 2017-12-13 LAB — CBC
HEMATOCRIT: 33.8 % — AB (ref 36.0–46.0)
Hemoglobin: 11 g/dL — ABNORMAL LOW (ref 12.0–15.0)
MCH: 29.3 pg (ref 26.0–34.0)
MCHC: 32.5 g/dL (ref 30.0–36.0)
MCV: 90.1 fL (ref 78.0–100.0)
Platelets: 81 10*3/uL — ABNORMAL LOW (ref 150–400)
RBC: 3.75 MIL/uL — ABNORMAL LOW (ref 3.87–5.11)
RDW: 14.3 % (ref 11.5–15.5)
WBC: 4.6 10*3/uL (ref 4.0–10.5)

## 2017-12-13 LAB — I-STAT TROPONIN, ED: Troponin i, poc: 0.22 ng/mL (ref 0.00–0.08)

## 2017-12-13 LAB — TROPONIN I: TROPONIN I: 0.16 ng/mL — AB (ref <0.03)

## 2017-12-13 MED ORDER — ALBUTEROL SULFATE (2.5 MG/3ML) 0.083% IN NEBU
2.5000 mg | INHALATION_SOLUTION | Freq: Four times a day (QID) | RESPIRATORY_TRACT | Status: DC | PRN
  Administered 2017-12-14: 2.5 mg via RESPIRATORY_TRACT
  Filled 2017-12-13: qty 3

## 2017-12-13 MED ORDER — POTASSIUM CHLORIDE CRYS ER 20 MEQ PO TBCR
60.0000 meq | EXTENDED_RELEASE_TABLET | Freq: Every day | ORAL | Status: DC
  Administered 2017-12-14 – 2017-12-16 (×3): 60 meq via ORAL
  Filled 2017-12-13 (×4): qty 3

## 2017-12-13 MED ORDER — ADULT MULTIVITAMIN W/MINERALS CH
1.0000 | ORAL_TABLET | Freq: Every day | ORAL | Status: DC
  Administered 2017-12-14 – 2017-12-17 (×4): 1 via ORAL
  Filled 2017-12-13 (×4): qty 1

## 2017-12-13 MED ORDER — BENAZEPRIL HCL 5 MG PO TABS
2.5000 mg | ORAL_TABLET | Freq: Every day | ORAL | Status: DC
  Administered 2017-12-14 – 2017-12-17 (×4): 2.5 mg via ORAL
  Filled 2017-12-13 (×4): qty 1

## 2017-12-13 MED ORDER — DONEPEZIL HCL 5 MG PO TABS
10.0000 mg | ORAL_TABLET | Freq: Every day | ORAL | Status: DC
  Administered 2017-12-13 – 2017-12-16 (×4): 10 mg via ORAL
  Filled 2017-12-13 (×4): qty 2

## 2017-12-13 MED ORDER — FUROSEMIDE 10 MG/ML IJ SOLN
60.0000 mg | Freq: Two times a day (BID) | INTRAMUSCULAR | Status: DC
  Administered 2017-12-14 – 2017-12-16 (×6): 60 mg via INTRAVENOUS
  Filled 2017-12-13 (×9): qty 6

## 2017-12-13 MED ORDER — MIRTAZAPINE 30 MG PO TABS
30.0000 mg | ORAL_TABLET | Freq: Every day | ORAL | Status: DC
  Administered 2017-12-14 – 2017-12-16 (×4): 30 mg via ORAL
  Filled 2017-12-13 (×5): qty 1

## 2017-12-13 MED ORDER — TRAMADOL HCL 50 MG PO TABS
50.0000 mg | ORAL_TABLET | Freq: Two times a day (BID) | ORAL | Status: DC
  Administered 2017-12-13 – 2017-12-17 (×8): 50 mg via ORAL
  Filled 2017-12-13 (×8): qty 1

## 2017-12-13 MED ORDER — OXYBUTYNIN CHLORIDE 5 MG PO TABS
5.0000 mg | ORAL_TABLET | Freq: Every day | ORAL | Status: DC
  Administered 2017-12-14 – 2017-12-17 (×4): 5 mg via ORAL
  Filled 2017-12-13 (×4): qty 1

## 2017-12-13 MED ORDER — SODIUM CHLORIDE 0.9% FLUSH: 3.0000 mL | INTRAVENOUS | Status: DC | PRN

## 2017-12-13 MED ORDER — ASPIRIN 81 MG PO CHEW
81.0000 mg | CHEWABLE_TABLET | Freq: Every day | ORAL | Status: DC
  Administered 2017-12-14 – 2017-12-17 (×4): 81 mg via ORAL
  Filled 2017-12-13 (×4): qty 1

## 2017-12-13 MED ORDER — MOMETASONE FURO-FORMOTEROL FUM 200-5 MCG/ACT IN AERO
2.0000 | INHALATION_SPRAY | Freq: Two times a day (BID) | RESPIRATORY_TRACT | Status: DC
  Administered 2017-12-14 – 2017-12-17 (×7): 2 via RESPIRATORY_TRACT
  Filled 2017-12-13: qty 8.8

## 2017-12-13 MED ORDER — ATORVASTATIN CALCIUM 20 MG PO TABS
20.0000 mg | ORAL_TABLET | Freq: Every day | ORAL | Status: DC
  Administered 2017-12-13 – 2017-12-17 (×5): 20 mg via ORAL
  Filled 2017-12-13 (×5): qty 1

## 2017-12-13 MED ORDER — ACETAMINOPHEN 325 MG PO TABS
650.0000 mg | ORAL_TABLET | ORAL | Status: DC | PRN
  Administered 2017-12-15: 650 mg via ORAL
  Filled 2017-12-13: qty 2

## 2017-12-13 MED ORDER — ALBUTEROL SULFATE HFA 108 (90 BASE) MCG/ACT IN AERS: 2.0000 | INHALATION_SPRAY | Freq: Four times a day (QID) | RESPIRATORY_TRACT | Status: DC | PRN

## 2017-12-13 MED ORDER — ONDANSETRON HCL 4 MG/2ML IJ SOLN: 4.0000 mg | Freq: Four times a day (QID) | INTRAMUSCULAR | Status: DC | PRN

## 2017-12-13 MED ORDER — FAMOTIDINE 20 MG PO TABS
20.0000 mg | ORAL_TABLET | Freq: Every day | ORAL | Status: DC
  Administered 2017-12-14 – 2017-12-17 (×4): 20 mg via ORAL
  Filled 2017-12-13 (×4): qty 1

## 2017-12-13 MED ORDER — SODIUM CHLORIDE 0.9% FLUSH
3.0000 mL | Freq: Two times a day (BID) | INTRAVENOUS | Status: DC
  Administered 2017-12-13 – 2017-12-17 (×8): 3 mL via INTRAVENOUS

## 2017-12-13 MED ORDER — FUROSEMIDE 10 MG/ML IJ SOLN
80.0000 mg | Freq: Once | INTRAMUSCULAR | Status: AC
  Administered 2017-12-13: 80 mg via INTRAVENOUS
  Filled 2017-12-13: qty 8

## 2017-12-13 MED ORDER — DOCUSATE SODIUM 100 MG PO CAPS: 100.0000 mg | ORAL_CAPSULE | Freq: Every day | ORAL | Status: DC | PRN

## 2017-12-13 MED ORDER — SODIUM CHLORIDE 0.9 % IV SOLN: 250.0000 mL | INTRAVENOUS | Status: DC | PRN

## 2017-12-13 NOTE — Plan of Care (Signed)
Will continue to monitor.

## 2017-12-13 NOTE — Progress Notes (Addendum)
CRITICAL VALUE ALERT  Critical Value:  Troponin 0.16 (taken from [facility])  Date & Time Notied:  12/13/2017 2048  Provider Notified: on call hospitalist  Orders Received/Actions taken:  As of 12/14/17 @ 0118, no return call and no new orders placed. Patient states that she is feeling fine, and denies any chest pain. Will continue to monitor.  Troponin from 12/13/17 @ 2343 noted to be 0.17. Sent message to Dr. Robb Matarrtiz in regards to these values.

## 2017-12-13 NOTE — ED Provider Notes (Signed)
Emergency Department Provider Note   I have reviewed the triage vital signs and the nursing notes.   HISTORY  Chief Complaint Shortness of Breath   HPI Brittney Li is a 84 y.o. female with multiple medical problems documented below the presents to the emergency department today secondary to dyspnea.  Soundly patient is a history of grade 2 diastolic heart failure and is on baseline 3 L of oxygen at home.  She called EMS because he felt short of breath and had worsening lower extremity swelling.  When they got there O2 sats were in the 70s but her conservator was not working appropriately.  Some of her shortness of breath improved with putting her on there oxygen however patient still with persistent tachypnea and dyspnea on arrival here.  Also with significant dyspnea on exertion and lower extremity swelling has been worse over the last few days.  She states this is similar to previous episodes of heart failure.  She states compliance with her medications.  Somewhat compliant with diet.  No recent fevers.  She does have orthopnea. No other associated or modifying symptoms.    Past Medical History:  Diagnosis Date  . Arthritis   . Asthma   . Bronchitis   . CHF (congestive heart failure) (HCC)    a. ECHO (01/2013) EF 60-65%, grade II diastolic dysfx, LA mildly dilated  . COPD (chronic obstructive pulmonary disease) (HCC)   . Diabetes mellitus without complication (HCC)   . Hypertension     Patient Active Problem List   Diagnosis Date Noted  . CHF (congestive heart failure) (HCC) 10/01/2017  . Chronic respiratory failure with hypoxia (HCC) 10/01/2017  . Thrombocytopenia (HCC) 06/24/2017  . CHF exacerbation (HCC) 06/23/2017  . Cardiorenal syndrome with renal failure 03/02/2013  . Acute renal failure (ARF) (HCC) 02/28/2013  . Acute hyperkalemia 02/28/2013  . Acute on chronic respiratory failure with hypoxia (HCC) 02/28/2013  . Acute diastolic heart failure (HCC) 02/28/2013    . CKD (chronic kidney disease) stage 3, GFR 30-59 ml/min (HCC) 02/28/2013  . Hypoglycemia 02/28/2013  . Acute on chronic diastolic (congestive) heart failure (HCC) 02/02/2013  . Impingement syndrome of left shoulder 09/21/2012  . Rotator cuff tear arthropathy 09/21/2012  . Arthritis of shoulder region, degenerative 09/21/2012  . Arthritis of hand, degenerative 02/17/2011  . Arthritis of wrist, left, degenerative 01/06/2011  . WRIST PAIN, LEFT 04/15/2010  . SHOULDER PAIN 11/08/2008  . IMPINGEMENT SYNDROME 11/08/2008  . TRIGGER FINGER, RIGHT THUMB 12/28/2006  . DEPRESSION 11/29/2006  . Dementia 11/05/2006  . CONSTIPATION, DRUG INDUCED 10/08/2006  . VERTIGO 09/10/2006  . HLD (hyperlipidemia) 04/27/2006  . SYNDROME, CARPAL TUNNEL 04/27/2006  . Essential hypertension 04/27/2006  . ALLERGIC RHINITIS 04/27/2006  . BRONCHITIS, CHRONIC NOS 04/27/2006  . Asthma 04/27/2006  . GERD 04/27/2006  . FIBROCYSTIC BREAST DISEASE 04/27/2006  . URINARY INCONTINENCE 04/27/2006  . CEREBROVASCULAR ACCIDENT, HX OF 04/27/2006    Past Surgical History:  Procedure Laterality Date  . ABDOMINAL HYSTERECTOMY  1975  . hands      Current Outpatient Rx  . Order #: [id number] Class: Historical Med  . Order #: [id number] Class: Historical Med  . Order #: [id number] Class: Historical Med  . Order #: [id number] Class: Historical Med  . Order #: [id number] Class: No Print  . Order #: [id number] Class: Historical Med  . Order #: [id number] Class: Historical Med  . Order #: [id number] Class: Historical Med  . Order #: [id number] Class: Historical Med  . Order #: [id number] Class: Normal  . Order #: [id number] Class:  Historical Med  . Order #: [id number] Class: Historical Med  . Order #: [id number] Class: Normal  . Order #: [id number] Class: No Print  . Order #: [id number] Class: Historical Med    Allergies Erythromycin; Fluviral [flu virus vaccine]; and Penicillins  Family History  Problem Relation Age of Onset  . Arthritis Unknown   .  Asthma Unknown   . Diabetes Unknown     Social History Social History   Tobacco Use  . Smoking status: Former Games developer  . Smokeless tobacco: Never Used  . Tobacco comment: quit smoking 1990  Substance Use Topics  . Alcohol use: No  . Drug use: No    Review of Systems  All other systems negative except as documented in the HPI. All pertinent positives and negatives as reviewed in the HPI. ____________________________________________   PHYSICAL EXAM:  VITAL SIGNS: ED Triage Vitals  Enc Vitals Group     BP 12/13/17 1421 109/68     Pulse Rate 12/13/17 1421 61     Resp 12/13/17 1421 (!) 24     Temp 12/13/17 1421 98.1 F (36.7 C)     Temp Source 12/13/17 1421 Oral     SpO2 12/13/17 1421 98 %     Weight 12/13/17 1416 162 lb (73.5 kg)     Height 12/13/17 1416 5\' 4"  (1.626 m)    Constitutional: Alert and oriented. Well appearing and in no acute distress. Eyes: Conjunctivae are normal. PERRL. EOMI. Head: Atraumatic. Nose: No congestion/rhinnorhea. Mouth/Throat: Mucous membranes are moist.  Oropharynx non-erythematous. Neck: No stridor.  No meningeal signs.   Cardiovascular: Normal rate, regular rhythm. Good peripheral circulation. Grossly normal heart sounds.   Respiratory: Tachypneic respiratory effort.  No retractions. Lungs with crackles. Gastrointestinal: Soft and nontender. No distention.  Musculoskeletal: No lower extremity tenderness but significant edema to knees bilaterally. No gross deformities of extremities. Neurologic:  Normal speech and language. No gross focal neurologic deficits are appreciated.  Skin:  Skin is warm, dry and intact. No rash noted.  ____________________________________________   LABS (all labs ordered are listed, but only abnormal results are displayed)  Labs Reviewed  BASIC METABOLIC PANEL - Abnormal; Notable for the following components:      Result Value   Chloride 95 (*)    CO2 41 (*)    BUN 39 (*)    Creatinine, Ser 1.42 (*)     Calcium 8.5 (*)    GFR calc non Af Amer 32 (*)    GFR calc Af Amer 38 (*)    All other components within normal limits  CBC - Abnormal; Notable for the following components:   RBC 3.75 (*)    Hemoglobin 11.0 (*)    HCT 33.8 (*)    Platelets 81 (*)    All other components within normal limits  BRAIN NATRIURETIC PEPTIDE - Abnormal; Notable for the following components:   B Natriuretic Peptide 1,051.0 (*)    All other components within normal limits  I-STAT TROPONIN, ED - Abnormal; Notable for the following components:   Troponin i, poc 0.22 (*)    All other components within normal limits   ____________________________________________  EKG   EKG Interpretation  Date/Time:  Monday December 13 2017 14:20:49 EDT Ventricular Rate:  62 PR Interval:    QRS Duration: 123 QT Interval:  449 QTC Calculation: 456 R Axis:   -99 Text Interpretation:  sinus rhythm with PVC's Right bundle branch block Inferior infarct, age indeterminate Probable anterior infarct, age indeterminate Confirmed by Marily Memos [phone])  on 12/13/2017 3:23:26 PM       ____________________________________________  RADIOLOGY  Dg Chest Portable 1 View  Result Date: 12/13/2017 CLINICAL DATA:  84 year old presenting with acute onset of shortness of breath, generalized weakness and RIGHT shoulder pain. Current history of diabetes, hypertension, COPD, CHF, asthma and bronchitis. Former smoker. EXAM: PORTABLE CHEST 1 VIEW COMPARISON:  10/01/2017, 06/23/2017 and earlier, including CTA chest 10/03/2015. FINDINGS: Cardiac silhouette moderately enlarged, unchanged. Thoracic aorta atherosclerotic, unchanged. Prominent central pulmonary arteries, unchanged. Suboptimal inspiration which accounts for atelectasis in the lower lobes. Pulmonary venous hypertension with perhaps minimal to mild interstitial pulmonary edema. Lungs otherwise clear. BILATERAL pleural effusions suspected. IMPRESSION: 1. Stable cardiomegaly. Pulmonary venous  hypertension and minimal to mild interstitial pulmonary edema is suspected, indicating minimal to mild CHF. 2. Suboptimal inspiration. BILATERAL lower lobe atelectasis. Small BILATERAL pleural effusions are suspected. Electronically Signed   By: Hulan Saashomas  Lawrence M.D.   On: 12/13/2017 14:36    ____________________________________________   PROCEDURES  Procedure(s) performed:   Procedures  CRITICAL CARE Performed by: Marily MemosMesner, Driana Dazey Total critical care time: 35 minutes Critical care time was exclusive of separately billable procedures and treating other patients. Critical care was necessary to treat or prevent imminent or life-threatening deterioration. Critical care was time spent personally by me on the following activities: development of treatment plan with patient and/or surrogate as well as nursing, discussions with consultants, evaluation of patient's response to treatment, examination of patient, obtaining history from patient or surrogate, ordering and performing treatments and interventions, ordering and review of laboratory studies, ordering and review of radiographic studies, pulse oximetry and re-evaluation of patient's condition.  ____________________________________________   INITIAL IMPRESSION / ASSESSMENT AND PLAN / ED COURSE  Patient with what seems like his acute on chronic CHF exacerbation.  Given 80 of Lasix and still tachypneic with crackles.  Minimal urine output with that.  She also has pleural effusions elevated BNP and elevated troponin on her work-up.  Will hold on heparin at this time as I do not feel like this is beyond her baseline for CHF exacerbations.  Low suspicion for an STEMI or ACS at this point.  Will talk to hospitalist about admission for further diuresis.   Pertinent labs & imaging results that were available during my care of the patient were reviewed by me and considered in my medical decision making (see chart for  details).  ____________________________________________  FINAL CLINICAL IMPRESSION(S) / ED DIAGNOSES  Final diagnoses:  Acute on chronic diastolic congestive heart failure (HCC)     MEDICATIONS GIVEN DURING THIS VISIT:  Medications  furosemide (LASIX) injection 80 mg (80 mg Intravenous Given 12/13/17 1655)     NEW OUTPATIENT MEDICATIONS STARTED DURING THIS VISIT:  New Prescriptions   No medications on file    Note:  This note was prepared with assistance of Dragon voice recognition software. Occasional wrong-word or sound-a-like substitutions may have occurred due to the inherent limitations of voice recognition software.   Marily MemosMesner, Tashika Goodin, MD 12/13/17 [phone]

## 2017-12-13 NOTE — ED Triage Notes (Signed)
Ems reports pt c/o sob and generalized weakness.  Pt also c/o swelling to bilateral lower extremities.  Upon their arrival o2 sat 77% on o2 at 3 liters.  EMS says concentrator was not working properly.  EMS put pt on their oxygen tank and sob improved.  O2 sat increased to 96%.

## 2017-12-13 NOTE — H&P (Signed)
History and Physical    Brittney Li ZHY:[id number] DOB: 09/29/1929 DOA: 12/13/2017  PCP: Avon GullyFanta, Tesfaye, MD  Patient coming from: Home  Chief Complaint: Shortness of breath  HPI: Brittney Li is a 84 y.o. female with medical history significant diastolic congestive heart failure, chronic kidney disease, diabetes, hypertension, COPD, chronic respiratory failure on 3 L of oxygen at home called EMS today because she could not get up and walk around she was weak.  Patient has a history of dementia and does not exactly recall what happened but according to EMS when they arrived she was very short of breath and her oxygen tank was not working.  Her O2 sats were 74% on her 3 L but they were reporting that the oxygen was not flowing correctly and the compressor was not working.  Once they put her on their oxygen tank her O2 sats normalized.  She comes to the emergency department complaining of shortness of breath.  Patient denies any fever she denies any nausea vomiting diarrhea.  She is back to her normal respiratory status.  She does report she has been swelling her legs but she does not recall for how long.  Patient be referred for admission for congestive heart failure exacerbation.  She lives alone.  Review of Systems: As per HPI otherwise 10 point review of systems negative.   Past Medical History:  Diagnosis Date  . Arthritis   . Asthma   . Bronchitis   . CHF (congestive heart failure) (HCC)    a. ECHO (01/2013) EF 60-65%, grade II diastolic dysfx, LA mildly dilated  . COPD (chronic obstructive pulmonary disease) (HCC)   . Diabetes mellitus without complication (HCC)   . Hypertension     Past Surgical History:  Procedure Laterality Date  . ABDOMINAL HYSTERECTOMY  1975  . hands       reports that she has quit smoking. She has never used smokeless tobacco. She reports that she does not drink alcohol or use drugs.  Allergies  Allergen Reactions  . Erythromycin Other (See Comments)     Unknown   . Fluviral [Flu Virus Vaccine]   . Penicillins Other (See Comments)    .Has patient had a PCN reaction causing immediate rash, facial/tongue/throat swelling, SOB or lightheadedness with hypotension: Unknown Has patient had a PCN reaction causing severe rash involving mucus membranes or skin necrosis:No Has patient had a PCN reaction that required hospitalization: Yes Has patient had a PCN reaction occurring within the last 10 years: No If all of the above answers are "NO", then may proceed with Cephalosporin use.     Family History  Problem Relation Age of Onset  . Arthritis Unknown   . Asthma Unknown   . Diabetes Unknown     Prior to Admission medications   Medication Sig Start Date End Date Taking? Authorizing Provider  albuterol (PROVENTIL HFA;VENTOLIN HFA) 108 (90 BASE) MCG/ACT inhaler Inhale 2 puffs into the lungs every 6 (six) hours as needed for wheezing.   Yes [provider]  albuterol (PROVENTIL) (2.5 MG/3ML) 0.083% nebulizer solution Take 2.5 mg by nebulization every 6 (six) hours as needed for wheezing.   Yes [provider]  aspirin 81 MG tablet Take 81 mg by mouth daily.     Yes [provider]  atorvastatin (LIPITOR) 20 MG tablet Take 20 mg by mouth daily at 6 PM.   Yes [provider]  benazepril (LOTENSIN) 5 MG tablet Take 0.5 tablets (2.5 mg total) by  mouth daily. 10/07/17  Yes Johnson, Clanford L, MD  docusate sodium (COLACE) 100 MG capsule Take 100 mg by mouth daily as needed for mild constipation or moderate constipation.    Yes [provider]  donepezil (ARICEPT) 10 MG tablet Take 10 mg by mouth at bedtime.   Yes [provider]  famotidine (PEPCID) 20 MG tablet Take 20 mg by mouth daily.   Yes [provider]  mirtazapine (REMERON) 30 MG tablet Take 30 mg by mouth at bedtime.   Yes [provider]  mometasone-formoterol (DULERA) 200-5 MCG/ACT AERO Inhale 2 puffs into the lungs 2  (two) times daily. 02/06/13  Yes Avon Gully, MD  Multiple Vitamin (MULTIVITAMIN WITH MINERALS) TABS tablet Take 1 tablet by mouth daily.   Yes [provider]  oxybutynin (DITROPAN) 5 MG tablet Take 5 mg by mouth daily.   Yes [provider]  potassium chloride SA (K-DUR,KLOR-CON) 20 MEQ tablet Take 3 tablets (60 mEq total) by mouth daily. 07/13/17  Yes Strader, [place] M, PA-C  torsemide (DEMADEX) 20 MG tablet TAKE 60 MG ( 3 TABLETS) IN THE AM AND 40 MG (2 TABLET) IN THE PM Patient taking differently: Take 20-40 mg by mouth See admin instructions. TAKE 40 MG ( 2 TABLETS) IN THE AM AND 20 MG (1 TABLET) IN THE PM 10/07/17  Yes Johnson, Clanford L, MD  traMADol (ULTRAM) 50 MG tablet Take 50 mg by mouth 2 (two) times daily.    Yes [provider]    Physical Exam: Vitals:   12/13/17 1530 12/13/17 1600 12/13/17 1631 12/13/17 1703  BP: 105/61 96/60 97/68  117/61  Pulse:    (!) 59  Resp: (!) 21 19 18  (!) 23  Temp:      TempSrc:      SpO2:    99%  Weight:      Height:          Constitutional: NAD, calm, comfortable Vitals:   12/13/17 1530 12/13/17 1600 12/13/17 1631 12/13/17 1703  BP: 105/61 96/60 97/68  117/61  Pulse:    (!) 59  Resp: (!) 21 19 18  (!) 23  Temp:      TempSrc:      SpO2:    99%  Weight:      Height:       Eyes: PERRL, lids and conjunctivae normal ENMT: Mucous membranes are moist. Posterior pharynx clear of any exudate or lesions.Normal dentition.  Neck: normal, supple, no masses, no thyromegaly Respiratory: clear to auscultation bilaterally, no wheezing, no crackles. Normal respiratory effort. No accessory muscle use.  Cardiovascular: Regular rate and rhythm, no murmurs / rubs / gallops.  1+ extremity edema. 2+ pedal pulses. No carotid bruits.  Abdomen: no tenderness, no masses palpated. No hepatosplenomegaly. Bowel sounds positive.  Musculoskeletal: no clubbing / cyanosis. No joint deformity upper and lower extremities. Good ROM, no  contractures. Normal muscle tone.  Skin: no rashes, lesions, ulcers. No induration Neurologic: CN 2-12 grossly intact. Sensation intact, DTR normal. Strength 5/5 in all 4.  Psychiatric: Normal judgment and insight. Alert and oriented x 2. Normal mood.    Labs on Admission: I have personally reviewed following labs and imaging studies  CBC: Recent Labs  Lab 12/13/17 1445  WBC 4.6  HGB 11.0*  HCT 33.8*  MCV 90.1  PLT 81*   Basic Metabolic Panel: Recent Labs  Lab 12/13/17 1445  NA 141  K 4.7  CL 95*  CO2 41*  GLUCOSE 96  BUN 39*  CREATININE 1.42*  CALCIUM 8.5*   GFR: Estimated Creatinine Clearance: 27.9 mL/min (A) (by C-G formula based on SCr of 1.42 mg/dL (H)). Liver Function Tests: No results for input(s): AST, ALT, ALKPHOS, BILITOT, PROT, ALBUMIN in the last 168 hours. No results for input(s): LIPASE, AMYLASE in the last 168 hours. No results for input(s): AMMONIA in the last 168 hours. Coagulation Profile: No results for input(s): INR, PROTIME in the last 168 hours. Cardiac Enzymes: No results for input(s): CKTOTAL, CKMB, CKMBINDEX, TROPONINI in the last 168 hours. BNP (last 3 results) No results for input(s): PROBNP in the last 8760 hours. HbA1C: No results for input(s): HGBA1C in the last 72 hours. CBG: No results for input(s): GLUCAP in the last 168 hours. Lipid Profile: No results for input(s): CHOL, HDL, LDLCALC, TRIG, CHOLHDL, LDLDIRECT in the last 72 hours. Thyroid Function Tests: No results for input(s): TSH, T4TOTAL, FREET4, T3FREE, THYROIDAB in the last 72 hours. Anemia Panel: No results for input(s): VITAMINB12, FOLATE, FERRITIN, TIBC, IRON, RETICCTPCT in the last 72 hours. Urine analysis:    Component Value Date/Time   COLORURINE YELLOW 02/28/2013 0656   APPEARANCEUR CLEAR 02/28/2013 0656   LABSPEC 1.008 02/28/2013 0656   PHURINE 5.0 02/28/2013 0656   GLUCOSEU NEGATIVE 02/28/2013 0656   HGBUR MODERATE (A) 02/28/2013 0656   BILIRUBINUR  NEGATIVE 02/28/2013 0656   KETONESUR NEGATIVE 02/28/2013 0656   PROTEINUR 30 (A) 02/28/2013 0656   UROBILINOGEN 1.0 02/28/2013 0656   NITRITE NEGATIVE 02/28/2013 0656   LEUKOCYTESUR SMALL (A) 02/28/2013 0656   Sepsis Labs: !!!!!!!!!!!!!!!!!!!!!!!!!!!!!!!!!!!!!!!!!!!! @LABRCNTIP (procalcitonin:4,lacticidven:4) )No results found for this or any previous visit (from the past 240 hour(s)).   Radiological Exams on Admission: Dg Chest Portable 1 View  Result Date: 12/13/2017 CLINICAL DATA:  84 year old presenting with acute onset of shortness of breath, generalized weakness and RIGHT shoulder pain. Current history of diabetes, hypertension, COPD, CHF, asthma and bronchitis. Former smoker. EXAM: PORTABLE CHEST 1 VIEW COMPARISON:  10/01/2017, 06/23/2017 and earlier, including CTA chest 10/03/2015. FINDINGS: Cardiac silhouette moderately enlarged, unchanged. Thoracic aorta atherosclerotic, unchanged. Prominent central pulmonary arteries, unchanged. Suboptimal inspiration which accounts for atelectasis in the lower lobes. Pulmonary venous hypertension with perhaps minimal to mild interstitial pulmonary edema. Lungs otherwise clear. BILATERAL pleural effusions suspected. IMPRESSION: 1. Stable cardiomegaly. Pulmonary venous hypertension and minimal to mild interstitial pulmonary edema is suspected, indicating minimal to mild CHF. 2. Suboptimal inspiration. BILATERAL lower lobe atelectasis. Small BILATERAL pleural effusions are suspected. Electronically Signed   By: Hulan Saas M.D.   On: 12/13/2017 14:36    EKG: Independently reviewed.  Normal sinus rhythm with PVCs and right bundle branch block old compared to old EKGs  Last echo January 2019 with preserved EF and diastolic dysfunction  Old chart reviewed  Case discussed with Dr. Rodena Medin in the ED  Assessment/Plan 84 year old female with acute on chronic diastolic congestive heart failure Principal Problem:   Acute on chronic diastolic  (congestive) heart failure (HCC)-2D echo done in January 2019 will not repeat showed diastolic dysfunction.  Increase Lasix to 80 mg IV every 12 hours and watch renal function closely as we diurese her.  O2 sats are normal back on her 3 L.  Will need to call her oxygen supply company prior to discharge to make sure her compressor and OT's take her working at home.  Active Problems:   Acute on chronic respiratory failure with hypoxia (HCC)-back to baseline   Elevated troponin-patient chronically has elevation of her troponin up to 0.2.  Will serial overnight.  Dementia-noted   Essential hypertension-continue home meds   CKD (chronic kidney disease) stage 3, GFR 30-59 ml/min (HCC)-at baseline creatinine currently around 1.4.      DVT prophylaxis: SCDs Code Status: Full Family Communication: None Disposition Plan: 1 to 3 days Consults called: None Admission status: Admission   Frantz Quattrone A MD [facility]  If 7PM-7AM, please contact night-coverage [email or web address] Password [facility]  12/13/2017, 6:06 PM

## 2017-12-14 DIAGNOSIS — I10 Essential (primary) hypertension: Secondary | ICD-10-CM

## 2017-12-14 DIAGNOSIS — F0390 Unspecified dementia without behavioral disturbance: Secondary | ICD-10-CM

## 2017-12-14 DIAGNOSIS — N183 Chronic kidney disease, stage 3 (moderate): Secondary | ICD-10-CM

## 2017-12-14 DIAGNOSIS — R748 Abnormal levels of other serum enzymes: Secondary | ICD-10-CM

## 2017-12-14 DIAGNOSIS — I5033 Acute on chronic diastolic (congestive) heart failure: Secondary | ICD-10-CM

## 2017-12-14 LAB — BASIC METABOLIC PANEL
ANION GAP: 6 (ref 5–15)
BUN: 38 mg/dL — AB (ref 8–23)
CO2: 45 mmol/L — AB (ref 22–32)
CREATININE: 1.46 mg/dL — AB (ref 0.44–1.00)
Calcium: 8.8 mg/dL — ABNORMAL LOW (ref 8.9–10.3)
Chloride: 94 mmol/L — ABNORMAL LOW (ref 98–111)
GFR calc Af Amer: 36 mL/min — ABNORMAL LOW (ref >60)
GFR calc non Af Amer: 31 mL/min — ABNORMAL LOW (ref >60)
GLUCOSE: 95 mg/dL (ref 70–99)
POTASSIUM: 4.3 mmol/L (ref 3.5–5.1)
Sodium: 145 mmol/L (ref 135–145)

## 2017-12-14 LAB — TROPONIN I
Troponin I: 0.15 ng/mL (ref <0.03)
Troponin I: 0.17 ng/mL (ref <0.03)

## 2017-12-14 NOTE — Clinical Social Work Note (Signed)
Clinical Social Work Assessment  Patient Details  Name: Brittney Li MRN: [id number] Date of Birth: 07-16-1929  Date of referral:  12/14/17               Reason for consult:  Discharge Planning, Facility Placement                Permission sought to share information with:  Chartered certified accountant granted to share information::  Yes, Verbal Permission Granted  Name::        Agency::  [facility]  Relationship::     Contact Information:     Housing/Transportation Living arrangements for the past 2 months:  Apartment Source of Information:  Patient Patient Interpreter Needed:  None Criminal Activity/Legal Involvement Pertinent to Current Situation/Hospitalization:  No - Comment as needed Significant Relationships:  Adult Children, Other Family Members Lives with:  Self Do you feel safe going back to the place where you live?  Yes Need for family participation in patient care:  No (Coment)  Care giving concerns: PT recommending SNF short term rehab.   Social Worker assessment / plan: Pt is an 84 year old female referred to [facility] for SNF placement. Met with pt this afternoon to assess. Pt lives alone in an apartment here in [place]. Pt states she has been at [facility] in the past for short term rehab and she is agreeable to a referral there again. Will start referral and follow for dc planning needs.  Employment status:  Retired Forensic scientist:  Information systems manager, Medicaid [place] PT Recommendations:  [place] / Referral to community resources:  [place]  Patient/Family's Response to care: Pt accepting of care.  Patient/Family's Understanding of and Emotional Response to Diagnosis, Current Treatment, and Prognosis: Pt appears to have a good understanding of diagnosis and treatment recommendations. No emotional distress identified.  Emotional Assessment Appearance:  Appears stated age Attitude/Demeanor/Rapport:   Engaged Affect (typically observed):  Calm, Pleasant Orientation:  Oriented to Self, Oriented to Place, Oriented to  Time, Oriented to Situation Alcohol / Substance use:  Not Applicable Psych involvement (Current and /or in the community):  No (Comment)  Discharge Needs  Concerns to be addressed:  Discharge Planning Concerns Readmission within the last 30 days:  No Current discharge risk:  Physical Impairment Barriers to Discharge:  No Barriers Identified   Shade Flood, LCSW 12/14/2017, 1:14 PM

## 2017-12-14 NOTE — Evaluation (Signed)
Physical Therapy Evaluation Patient Details Name: Brittney Li MRN: [id number] DOB: 12/14/1929 Today's Date: 12/14/2017   History of Present Illness  Brittney Li is a 84 y.o. female with medical history significant diastolic congestive heart failure, chronic kidney disease, diabetes, hypertension, COPD, chronic respiratory failure on 3 L of oxygen at home called EMS today because she could not get up and walk around she was weak.  Patient has a history of dementia and does not exactly recall what happened but according to EMS when they arrived she was very short of breath and her oxygen tank was not working.  Her O2 sats were 74% on her 3 L but they were reporting that the oxygen was not flowing correctly and the compressor was not working.  Once they put her on their oxygen tank her O2 sats normalized.  She comes to the emergency department complaining of shortness of breath.  Patient denies any fever she denies any nausea vomiting diarrhea.  She is back to her normal respiratory status.  She does report she has been swelling her legs but she does not recall for how long.  Patient be referred for admission for congestive heart failure exacerbation.  She lives alone.    Clinical Impression  Patient limited for functional mobility as stated below secondary to BLE weakness, fatigue and poor standing balance.  Patient able to transfer to commode for a BM, fatigued after that and tolerated sitting up in chair after therapy - nursing staff aware.  Patient will benefit from continued physical therapy in hospital and recommended venue below to increase strength, balance, endurance for safe ADLs and gait.    Follow Up Recommendations SNF;Supervision/Assistance - 24 hour    Equipment Recommendations  None recommended by PT    Recommendations for Other Services       Precautions / Restrictions Precautions Precautions: Fall Restrictions Weight Bearing Restrictions: No      Mobility  Bed  Mobility Overal bed mobility: Needs Assistance Bed Mobility: Supine to Sit     Supine to sit: Min assist     General bed mobility comments: slow labored movement  Transfers Overall transfer level: Needs assistance Equipment used: Rolling walker (2 wheeled) Transfers: Sit to/from [employer]GI CorporationStand;Stand Pivot Transfers Sit to Stand: Min assist Stand pivot transfers: Min assist;Mod assist          Ambulation/Gait Ambulation/Gait assistance: Mod assist Gait Distance (Feet): 5 Feet Assistive device: Rolling walker (2 wheeled) Gait Pattern/deviations: Decreased step length - right;Decreased step length - left;Decreased stride length Gait velocity: slow   General Gait Details: limited to 5-6 unsteady side steps at bedside due to generalized weakness and c/o fatigue  Stairs            Wheelchair Mobility    Modified Rankin (Stroke Patients Only)       Balance Overall balance assessment: Needs assistance Sitting-balance support: Feet supported;No upper extremity supported Sitting balance-Leahy Scale: Good     Standing balance support: Bilateral upper extremity supported;During functional activity Standing balance-Leahy Scale: Fair                               Pertinent Vitals/Pain Pain Assessment: No/denies pain    Home Living Family/patient expects to be discharged to:: Private residence Living Arrangements: Alone Available Help at Discharge: Personal care attendant;Available PRN/intermittently Type of Home: Apartment Home Access: Ramped entrance     Home Layout: One level Home Equipment: Walker - standard;Bedside  commode;Tub bench;Wheelchair - manual;Cane - single point;Walker - 2 wheels;Walker - 4 wheels;Shower seat      Prior Function Level of Independence: Needs assistance   Gait / Transfers Assistance Needed: household ambulation with RW  ADL's / Homemaking Assistance Needed: home aides 7 hours/day x 6 days/week, 5 hours/day on Saturday         Hand Dominance        Extremity/Trunk Assessment   Upper Extremity Assessment Upper Extremity Assessment: Generalized weakness    Lower Extremity Assessment Lower Extremity Assessment: Generalized weakness    Cervical / Trunk Assessment Cervical / Trunk Assessment: Kyphotic  Communication   Communication: No difficulties  Cognition Arousal/Alertness: Awake/alert Behavior During Therapy: WFL for tasks assessed/performed Overall Cognitive Status: Within Functional Limits for tasks assessed                                        General Comments      Exercises     Assessment/Plan    PT Assessment Patient needs continued PT services  PT Problem List Decreased strength;Decreased activity tolerance;Decreased balance;Decreased mobility       PT Treatment Interventions Gait training;Stair training;Functional mobility training;Therapeutic activities;Therapeutic exercise;Patient/family education    PT Goals (Current goals can be found in the Care Plan section)  Acute Rehab PT Goals Patient Stated Goal: return home after rehab Time For Goal Achievement: 12/28/17 Potential to Achieve Goals: Good    Frequency Min 3X/week   Barriers to discharge        Co-evaluation               AM-PAC PT "6 Clicks" Daily Activity  Outcome Measure Difficulty turning over in bed (including adjusting bedclothes, sheets and blankets)?: None Difficulty moving from lying on back to sitting on the side of the bed? : A Little Difficulty sitting down on and standing up from a chair with arms (e.g., wheelchair, bedside commode, etc,.)?: A Little Help needed moving to and from a bed to chair (including a wheelchair)?: A Little Help needed walking in hospital room?: A Lot Help needed climbing 3-5 steps with a railing? : Total 6 Click Score: 16    End of Session   Activity Tolerance: Patient tolerated treatment well;Patient limited by fatigue Patient left: in  chair;with call bell/phone within reach Nurse Communication: Mobility status PT Visit Diagnosis: Unsteadiness on feet (R26.81);Other abnormalities of gait and mobility (R26.89);Muscle weakness (generalized) (M62.81)    Time: 1113-1140 PT Time Calculation (min) (ACUTE ONLY): 27 min   Charges:   PT Evaluation $PT Eval Moderate Complexity: 1 Mod PT Treatments $Therapeutic Activity: 23-37 mins   PT G Codes:        12:10 PM, 01-05-2018 Ocie Bob, MPT Physical Therapist with [facility] [phone] office [phone] mobile phone

## 2017-12-14 NOTE — Progress Notes (Signed)
PROGRESS NOTE  CONITA AMENTA  ZOX:[id number]  DOB: 1929/10/08  DOA: 12/13/2017 PCP: Avon Gully, MD   Brief Admission Hx: Brittney Li is a 84 y.o. female with medical history significant diastolic congestive heart failure, chronic kidney disease, diabetes, hypertension, COPD, chronic respiratory failure on 3 L of oxygen at home called EMS today because she could not get up and walk around she was weak.  She does report she has been swelling her legs but she does not recall for how long.  Patient be referred for admission for congestive heart failure exacerbation.  She lives alone.  MDM/Assessment & Plan:   1. Acute exacerbation of chronic diastolic CHF -patient had a recent echocardiogram done in January 2019 which will not be repeated at this time.  Continue IV Lasix for diuresis.  Monitor intake and output and daily weights.  Continue supplemental oxygen. 2. Elevated troponins-suspect this is exacerbated by congestive heart failure but she does have a history of chronically elevated troponins up to 0.2.  They have remained stable. 3. Dementia- stable, follow closely. 4. Essential hypertension-resume home blood pressure medications. 5. Stage III CKD-we will follow closely in the setting of diuresis.  Repeat in the morning renal function panel.  DVT prophylaxis: SCDs Code Status: Full Family Communication: None present Disposition Plan: Inpatient medical treatment.   Subjective: Patient reports that she is urinating frequently with the IV Lasix.  Objective: Vitals:   12/13/17 2024 12/13/17 2100 12/14/17 0451 12/14/17 0913  BP:  122/64 (!) 106/54   Pulse:  70 (!) 59   Resp:  18 17   Temp:  98.7 F (37.1 C) 98.4 F (36.9 C)   TempSrc:  Oral Oral   SpO2: 96% 98% 98% 96%  Weight:      Height:        Intake/Output Summary (Last 24 hours) at 12/14/2017 1121 Last data filed at 12/14/2017 0700 Gross per 24 hour  Intake 240 ml  Output 400 ml  Net -160 ml   Filed Weights    12/13/17 1416 12/13/17 1846  Weight: 73.5 kg (162 lb) 85 kg (187 lb 6.3 oz)     REVIEW OF SYSTEMS  As per history otherwise all reviewed and reported negative  Exam:  General exam: Awake, alert, no apparent distress, cooperative and pleasant. Respiratory system: Clear. No increased work of breathing. Cardiovascular system: S1 & S2 heard.  Mild JVD, no murmurs, gallops, clicks or pedal edema. Gastrointestinal system: Abdomen is nondistended, soft and nontender. Normal bowel sounds heard. Central nervous system: Alert. No focal neurological deficits. Extremities: 2+ pitting edema bilateral lower extremities.  Data Reviewed: Basic Metabolic Panel: Recent Labs  Lab 12/13/17 1445 12/14/17 0625  NA 141 145  K 4.7 4.3  CL 95* 94*  CO2 41* 45*  GLUCOSE 96 95  BUN 39* 38*  CREATININE 1.42* 1.46*  CALCIUM 8.5* 8.8*   Liver Function Tests: No results for input(s): AST, ALT, ALKPHOS, BILITOT, PROT, ALBUMIN in the last 168 hours. No results for input(s): LIPASE, AMYLASE in the last 168 hours. No results for input(s): AMMONIA in the last 168 hours. CBC: Recent Labs  Lab 12/13/17 1445  WBC 4.6  HGB 11.0*  HCT 33.8*  MCV 90.1  PLT 81*   Cardiac Enzymes: Recent Labs  Lab 12/13/17 1919 12/13/17 2343 12/14/17 0625  TROPONINI 0.16* 0.17* 0.15*   CBG (last 3)  No results for input(s): GLUCAP in the last 72 hours. No results found for this or any previous visit (  from the past 240 hour(s)).   Studies: Dg Chest Portable 1 View  Result Date: 12/13/2017 CLINICAL DATA:  84 year old presenting with acute onset of shortness of breath, generalized weakness and RIGHT shoulder pain. Current history of diabetes, hypertension, COPD, CHF, asthma and bronchitis. Former smoker. EXAM: PORTABLE CHEST 1 VIEW COMPARISON:  10/01/2017, 06/23/2017 and earlier, including CTA chest 10/03/2015. FINDINGS: Cardiac silhouette moderately enlarged, unchanged. Thoracic aorta atherosclerotic, unchanged.  Prominent central pulmonary arteries, unchanged. Suboptimal inspiration which accounts for atelectasis in the lower lobes. Pulmonary venous hypertension with perhaps minimal to mild interstitial pulmonary edema. Lungs otherwise clear. BILATERAL pleural effusions suspected. IMPRESSION: 1. Stable cardiomegaly. Pulmonary venous hypertension and minimal to mild interstitial pulmonary edema is suspected, indicating minimal to mild CHF. 2. Suboptimal inspiration. BILATERAL lower lobe atelectasis. Small BILATERAL pleural effusions are suspected. Electronically Signed   By: Hulan Saashomas  Lawrence M.D.   On: 12/13/2017 14:36     Scheduled Meds: . aspirin  81 mg Oral Daily  . atorvastatin  20 mg Oral q1800  . benazepril  2.5 mg Oral Daily  . donepezil  10 mg Oral QHS  . famotidine  20 mg Oral Daily  . furosemide  60 mg Intravenous Q12H  . mirtazapine  30 mg Oral QHS  . mometasone-formoterol  2 puff Inhalation BID  . multivitamin with minerals  1 tablet Oral Daily  . oxybutynin  5 mg Oral Daily  . potassium chloride SA  60 mEq Oral Daily  . sodium chloride flush  3 mL Intravenous Q12H  . traMADol  50 mg Oral BID   Continuous Infusions: . sodium chloride      Principal Problem:   Acute on chronic diastolic (congestive) heart failure (HCC) Active Problems:   Dementia   Essential hypertension   Acute on chronic respiratory failure with hypoxia (HCC)   CKD (chronic kidney disease) stage 3, GFR 30-59 ml/min (HCC)   Elevated troponin  Time spent:   Standley Dakinslanford Demont Linford, MD, FAAFP [facility] Pager [phone]  If 7PM-7AM, please contact night-coverage [email or web address] Password [email or web address] 12/14/2017, 11:21 AM    LOS: 1 day

## 2017-12-14 NOTE — NC FL2 (Signed)
[place] MEDICAID FL2 LEVEL OF CARE SCREENING TOOL     IDENTIFICATION  Patient Name: Brittney Li Birthdate: 12/11/1929 Sex: female Admission Date (Current Location): 12/13/2017  [place]:  [place] [id number] L Facility and Address:  [facility],  [address], [place] [id number]      Provider Number: [id number]  Attending Physician Name and Address:  Cleora FleetJohnson, Clanford L, MD  Relative Name and Phone Number:  Fredia SorrowJames Furguson [phone]    Current Level of Care: Hospital Recommended Level of Care: Skilled Nursing Facility Prior Approval Number: [id number]  Date Approved/Denied: 03/02/13 PASRR Number:    Discharge Plan: SNF    Current Diagnoses: Patient Active Problem List   Diagnosis Date Noted  . Elevated troponin 12/13/2017  . CHF (congestive heart failure) (HCC) 10/01/2017  . Chronic respiratory failure with hypoxia (HCC) 10/01/2017  . Thrombocytopenia (HCC) 06/24/2017  . CHF exacerbation (HCC) 06/23/2017  . Cardiorenal syndrome with renal failure 03/02/2013  . Acute renal failure (ARF) (HCC) 02/28/2013  . Acute hyperkalemia 02/28/2013  . Acute on chronic respiratory failure with hypoxia (HCC) 02/28/2013  . Acute diastolic heart failure (HCC) 02/28/2013  . CKD (chronic kidney disease) stage 3, GFR 30-59 ml/min (HCC) 02/28/2013  . Hypoglycemia 02/28/2013  . Acute on chronic diastolic (congestive) heart failure (HCC) 02/02/2013  . Impingement syndrome of left shoulder 09/21/2012  . Rotator cuff tear arthropathy 09/21/2012  . Arthritis of shoulder region, degenerative 09/21/2012  . Arthritis of hand, degenerative 02/17/2011  . Arthritis of wrist, left, degenerative 01/06/2011  . WRIST PAIN, LEFT 04/15/2010  . SHOULDER PAIN 11/08/2008  . IMPINGEMENT SYNDROME 11/08/2008  . TRIGGER FINGER, RIGHT THUMB 12/28/2006  . DEPRESSION 11/29/2006  . Dementia 11/05/2006  . CONSTIPATION, DRUG INDUCED 10/08/2006  . VERTIGO 09/10/2006  .  HLD (hyperlipidemia) 04/27/2006  . SYNDROME, CARPAL TUNNEL 04/27/2006  . Essential hypertension 04/27/2006  . ALLERGIC RHINITIS 04/27/2006  . BRONCHITIS, CHRONIC NOS 04/27/2006  . Asthma 04/27/2006  . GERD 04/27/2006  . FIBROCYSTIC BREAST DISEASE 04/27/2006  . URINARY INCONTINENCE 04/27/2006  . CEREBROVASCULAR ACCIDENT, HX OF 04/27/2006    Orientation RESPIRATION BLADDER Height & Weight     Self, Time, Situation, Place  O2(see dc summary) Continent Weight: 187 lb 6.3 oz (85 kg) Height:  5\' 4"  (162.6 cm)  BEHAVIORAL SYMPTOMS/MOOD NEUROLOGICAL BOWEL NUTRITION STATUS      Continent Diet(see dc summary)  AMBULATORY STATUS COMMUNICATION OF NEEDS Skin   Extensive Assist Verbally Normal                       Personal Care Assistance Level of Assistance  Bathing, Feeding, Dressing Bathing Assistance: Limited assistance Feeding assistance: Independent Dressing Assistance: Limited assistance     Functional Limitations Info  Sight, Hearing, Speech Sight Info: Adequate Hearing Info: Adequate Speech Info: Adequate    SPECIAL CARE FACTORS FREQUENCY  PT (By licensed PT)     PT Frequency: 5times/week              Contractures Contractures Info: Not present    Additional Factors Info  Code Status, Allergies, Psychotropic Code Status Info: full Allergies Info: Erythormycin, fluviral vaccine, penicillins Psychotropic Info: remeron         Current Medications (12/14/2017):  This is the current hospital active medication list Current Facility-Administered Medications  Medication Dose Route Frequency Provider Last Rate Last Dose  . 0.9 %  sodium chloride infusion  250 mL Intravenous PRN Haydee Monicaavid, Rachal A, MD      .  acetaminophen (TYLENOL) tablet 650 mg  650 mg Oral Q4H PRN Tarry Kos A, MD      . albuterol (PROVENTIL) (2.5 MG/3ML) 0.083% nebulizer solution 2.5 mg  2.5 mg Nebulization Q6H PRN Haydee Monica, MD   2.5 mg at 12/14/17 1244  . aspirin chewable tablet 81 mg   81 mg Oral Daily Tarry Kos A, MD   81 mg at 12/14/17 0935  . atorvastatin (LIPITOR) tablet 20 mg  20 mg Oral q1800 Haydee Monica, MD   20 mg at 12/13/17 2353  . benazepril (LOTENSIN) tablet 2.5 mg  2.5 mg Oral Daily Tarry Kos A, MD   2.5 mg at 12/14/17 0935  . docusate sodium (COLACE) capsule 100 mg  100 mg Oral Daily PRN Tarry Kos A, MD      . donepezil (ARICEPT) tablet 10 mg  10 mg Oral QHS Tarry Kos A, MD   10 mg at 12/13/17 2354  . famotidine (PEPCID) tablet 20 mg  20 mg Oral Daily Tarry Kos A, MD   20 mg at 12/14/17 0935  . furosemide (LASIX) injection 60 mg  60 mg Intravenous Q12H Tarry Kos A, MD   60 mg at 12/14/17 0540  . mirtazapine (REMERON) tablet 30 mg  30 mg Oral QHS Tarry Kos A, MD   30 mg at 12/14/17 0106  . mometasone-formoterol (DULERA) 200-5 MCG/ACT inhaler 2 puff  2 puff Inhalation BID Haydee Monica, MD   2 puff at 12/14/17 0913  . multivitamin with minerals tablet 1 tablet  1 tablet Oral Daily Haydee Monica, MD   1 tablet at 12/14/17 0935  . ondansetron (ZOFRAN) injection 4 mg  4 mg Intravenous Q6H PRN Haydee Monica, MD      . oxybutynin (DITROPAN) tablet 5 mg  5 mg Oral Daily Tarry Kos A, MD   5 mg at 12/14/17 0935  . potassium chloride SA (K-DUR,KLOR-CON) CR tablet 60 mEq  60 mEq Oral Daily Haydee Monica, MD   60 mEq at 12/14/17 0935  . sodium chloride flush (NS) 0.9 % injection 3 mL  3 mL Intravenous Q12H Tarry Kos A, MD   3 mL at 12/14/17 0936  . sodium chloride flush (NS) 0.9 % injection 3 mL  3 mL Intravenous PRN Haydee Monica, MD      . traMADol Janean Sark) tablet 50 mg  50 mg Oral BID Haydee Monica, MD   50 mg at 12/14/17 0935     Discharge Medications: Please see discharge summary for a list of discharge medications.  Relevant Imaging Results:  Relevant Lab Results:   Additional Information SSN: [id number]  Elliot Gault, LCSW

## 2017-12-14 NOTE — Plan of Care (Signed)
  Problem: Acute Rehab PT Goals(only PT should resolve) Goal: Pt Will Go Supine/Side To Sit Outcome: Progressing Flowsheets (Taken 12/14/2017 1213) Pt will go Supine/Side to Sit: with modified independence Goal: Patient Will Transfer Sit To/From Stand Outcome: Progressing Flowsheets (Taken 12/14/2017 1213) Patient will transfer sit to/from stand: with supervision Goal: Pt Will Transfer Bed To Chair/Chair To Bed Outcome: Progressing Flowsheets (Taken 12/14/2017 1213) Pt will Transfer Bed to Chair/Chair to Bed: min guard assist Goal: Pt Will Ambulate Outcome: Progressing Flowsheets (Taken 12/14/2017 1213) Pt will Ambulate: 25 feet;with minimal assist;with rolling walker   12:14 PM, 12/14/17 Ocie BobJames Trinnity Breunig, MPT Physical Therapist with [facility] 336 [phone] office [phone] mobile phone

## 2017-12-15 ENCOUNTER — Inpatient Hospital Stay (HOSPITAL_COMMUNITY): Payer: Medicare Other

## 2017-12-15 LAB — BASIC METABOLIC PANEL
ANION GAP: 6 (ref 5–15)
BUN: 33 mg/dL — ABNORMAL HIGH (ref 8–23)
CO2: 42 mmol/L — AB (ref 22–32)
CREATININE: 1.32 mg/dL — AB (ref 0.44–1.00)
Calcium: 8.5 mg/dL — ABNORMAL LOW (ref 8.9–10.3)
Chloride: 97 mmol/L — ABNORMAL LOW (ref 98–111)
GFR, EST AFRICAN AMERICAN: 41 mL/min — AB (ref >60)
GFR, EST NON AFRICAN AMERICAN: 35 mL/min — AB (ref >60)
GLUCOSE: 107 mg/dL — AB (ref 70–99)
Potassium: 4.2 mmol/L (ref 3.5–5.1)
Sodium: 145 mmol/L (ref 135–145)

## 2017-12-15 LAB — MAGNESIUM: Magnesium: 2.4 mg/dL (ref 1.7–2.4)

## 2017-12-15 MED ORDER — ORAL CARE MOUTH RINSE
15.0000 mL | Freq: Two times a day (BID) | OROMUCOSAL | Status: DC
  Administered 2017-12-15 – 2017-12-16 (×3): 15 mL via OROMUCOSAL

## 2017-12-15 NOTE — Clinical Social Work Note (Signed)
LCSW following. Met with pt today to update that she had been accepted at [facility]. Pt asked if she could go to [facility] instead. New referral sent to [facility]. Will await determination and follow for dc planning. Per MD, pt not yet stable for dc.

## 2017-12-15 NOTE — Care Management Important Message (Signed)
Important Message  Patient Details  Name: Charlton HawsMary A Manocchio MRN: [id number] Date of Birth: 08/21/1929   Medicare Important Message Given:  Yes    Renie OraHawkins, Jonathan Kirkendoll Smith 12/15/2017, 11:43 AM

## 2017-12-15 NOTE — Progress Notes (Signed)
PROGRESS NOTE  Brittney Li  ONG:[id number]  DOB: 1930-03-18  DOA: 12/13/2017 PCP: Avon Gully, MD  Brief Admission Hx: Brittney Li is a 84 y.o. female with medical history significant diastolic congestive heart failure, chronic kidney disease, diabetes, hypertension, COPD, chronic respiratory failure on 3 L of oxygen at home called EMS today because she could not get up and walk around she was weak.  She does report she has been swelling her legs but she does not recall for how long.  Patient be referred for admission for congestive heart failure exacerbation.  She lives alone.  MDM/Assessment & Plan:   1. Acute exacerbation of chronic diastolic CHF -patient had a recent echocardiogram done in January 2019 which will not be repeated at this time.  Continue IV Lasix for diuresis.  Monitor intake and output and daily weights.  Unfortunately the output readings haven't been measured accurately.  Continue supplemental oxygen.  Repeated chest xray shows persistent pulmonary edema but slightly improved from admission.  2. Elevated troponins-suspect this is exacerbated by congestive heart failure but she does have a history of chronically elevated troponins up to 0.2.  They have remained stable. 3. Dementia- stable, follow closely. 4. Essential hypertension-resume home blood pressure medications. 5. Stage III CKD-we will follow closely in the setting of diuresis.  It is slightly improved today.  Repeat in the morning renal function panel.  DVT prophylaxis: SCDs Code Status: Full Family Communication: None present Disposition Plan: SNF placement in 2-3 days   Subjective: Patient reports that she is urinating frequently with the IV Lasix.  Objective: Vitals:   12/14/17 1943 12/14/17 2330 12/15/17 0541 12/15/17 0804  BP:  (!) 110/55 (!) 117/57   Pulse:  (!) 55 (!) 58   Resp:  16 16   Temp:  98.1 F (36.7 C) 97.6 F (36.4 C)   TempSrc:  Oral Oral   SpO2: 97% 100% 97% 94%  Weight:    82.6 kg (182 lb 1.6 oz)   Height:        Intake/Output Summary (Last 24 hours) at 12/15/2017 1250 Last data filed at 12/15/2017 0630 Gross per 24 hour  Intake 300 ml  Output 450 ml  Net -150 ml   Filed Weights   12/13/17 1416 12/13/17 1846 12/15/17 0541  Weight: 73.5 kg (162 lb) 85 kg (187 lb 6.3 oz) 82.6 kg (182 lb 1.6 oz)     REVIEW OF SYSTEMS  As per history otherwise all reviewed and reported negative  Exam:  General exam: Awake, alert, no apparent distress, cooperative and pleasant. Respiratory system: Clear. No increased work of breathing. Cardiovascular system: S1 & S2 heard.  Mild JVD, no murmurs, gallops, clicks or pedal edema. Gastrointestinal system: Abdomen is nondistended, soft and nontender. Normal bowel sounds heard. Central nervous system: Alert. No focal neurological deficits. Extremities: 2+ pitting edema bilateral lower extremities.  Data Reviewed: Basic Metabolic Panel: Recent Labs  Lab 12/13/17 1445 12/14/17 0625 12/15/17 0426  NA 141 145 145  K 4.7 4.3 4.2  CL 95* 94* 97*  CO2 41* 45* 42*  GLUCOSE 96 95 107*  BUN 39* 38* 33*  CREATININE 1.42* 1.46* 1.32*  CALCIUM 8.5* 8.8* 8.5*  MG  --   --  2.4   Liver Function Tests: No results for input(s): AST, ALT, ALKPHOS, BILITOT, PROT, ALBUMIN in the last 168 hours. No results for input(s): LIPASE, AMYLASE in the last 168 hours. No results for input(s): AMMONIA in the last 168 hours. CBC: Recent  Labs  Lab 12/13/17 1445  WBC 4.6  HGB 11.0*  HCT 33.8*  MCV 90.1  PLT 81*   Cardiac Enzymes: Recent Labs  Lab 12/13/17 1919 12/13/17 2343 12/14/17 0625  TROPONINI 0.16* 0.17* 0.15*   CBG (last 3)  No results for input(s): GLUCAP in the last 72 hours. No results found for this or any previous visit (from the past 240 hour(s)).   Studies: Dg Chest Port 1 View  Result Date: 12/15/2017 CLINICAL DATA:  Pulmonary edema EXAM: PORTABLE CHEST 1 VIEW COMPARISON:  12/13/2017 FINDINGS: There is  bilateral mild interstitial thickening. There are small bilateral pleural effusions. There is no pneumothorax. There is stable cardiomegaly. The osseous structures are unremarkable. IMPRESSION: 1. Mild pulmonary edema. Electronically Signed   By: Elige KoHetal  Patel   On: 12/15/2017 10:47   Dg Chest Portable 1 View  Result Date: 12/13/2017 CLINICAL DATA:  84 year old presenting with acute onset of shortness of breath, generalized weakness and RIGHT shoulder pain. Current history of diabetes, hypertension, COPD, CHF, asthma and bronchitis. Former smoker. EXAM: PORTABLE CHEST 1 VIEW COMPARISON:  10/01/2017, 06/23/2017 and earlier, including CTA chest 10/03/2015. FINDINGS: Cardiac silhouette moderately enlarged, unchanged. Thoracic aorta atherosclerotic, unchanged. Prominent central pulmonary arteries, unchanged. Suboptimal inspiration which accounts for atelectasis in the lower lobes. Pulmonary venous hypertension with perhaps minimal to mild interstitial pulmonary edema. Lungs otherwise clear. BILATERAL pleural effusions suspected. IMPRESSION: 1. Stable cardiomegaly. Pulmonary venous hypertension and minimal to mild interstitial pulmonary edema is suspected, indicating minimal to mild CHF. 2. Suboptimal inspiration. BILATERAL lower lobe atelectasis. Small BILATERAL pleural effusions are suspected. Electronically Signed   By: Hulan Saashomas  Lawrence M.D.   On: 12/13/2017 14:36   Scheduled Meds: . aspirin  81 mg Oral Daily  . atorvastatin  20 mg Oral q1800  . benazepril  2.5 mg Oral Daily  . donepezil  10 mg Oral QHS  . famotidine  20 mg Oral Daily  . furosemide  60 mg Intravenous Q12H  . mouth rinse  15 mL Mouth Rinse BID  . mirtazapine  30 mg Oral QHS  . mometasone-formoterol  2 puff Inhalation BID  . multivitamin with minerals  1 tablet Oral Daily  . oxybutynin  5 mg Oral Daily  . potassium chloride SA  60 mEq Oral Daily  . sodium chloride flush  3 mL Intravenous Q12H  . traMADol  50 mg Oral BID   Continuous  Infusions: . sodium chloride      Principal Problem:   Acute on chronic diastolic (congestive) heart failure (HCC) Active Problems:   Dementia   Essential hypertension   Acute on chronic respiratory failure with hypoxia (HCC)   CKD (chronic kidney disease) stage 3, GFR 30-59 ml/min (HCC)   Elevated troponin  Time spent:   Standley Dakinslanford Lyndal Reggio, MD, FAAFP [facility] Pager [phone]  If 7PM-7AM, please contact night-coverage [email or web address] Password [email or web address] 12/15/2017, 12:50 PM    LOS: 2 days

## 2017-12-15 NOTE — Care Management Note (Signed)
Case Management Note  Patient Details  Name: Brittney Li MRN: [id number] Date of Birth: 10/29/1929  Subjective/Objective:     Admitted with CHF exacerbation. Pt is from home, lives alone, has aid that is with her during hte day. [facility] [place]. [facility] Per H&P pt's home oxygen concentrator is not functioning properly.                Action/Plan: Plan is for DC to SNF, CSW has made placement arrangements. CM has notified [facility] of DC plan. [facility] rep is aware pt's concentrator is reported to not be functioning properly.   Expected Discharge Date:    12/17/2017              Expected Discharge Plan:  Skilled Nursing Facility  In-House Referral:  Clinical Social Work  Discharge planning Services  CM Consult  Post Acute Care Choice:  NA Choice offered to:  NA  Status of Service:  Completed, signed off  If discussed at Long Length of Stay Meetings, dates discussed:    Additional Comments:  [name], Brittney Aja Demske, RN 12/15/2017, 11:06 AM

## 2017-12-16 LAB — BASIC METABOLIC PANEL
ANION GAP: 3 — AB (ref 5–15)
BUN: 32 mg/dL — ABNORMAL HIGH (ref 8–23)
CALCIUM: 8.5 mg/dL — AB (ref 8.9–10.3)
CO2: 43 mmol/L — AB (ref 22–32)
Chloride: 99 mmol/L (ref 98–111)
Creatinine, Ser: 1.26 mg/dL — ABNORMAL HIGH (ref 0.44–1.00)
GFR, EST AFRICAN AMERICAN: 43 mL/min — AB (ref >60)
GFR, EST NON AFRICAN AMERICAN: 37 mL/min — AB (ref >60)
Glucose, Bld: 105 mg/dL — ABNORMAL HIGH (ref 70–99)
Potassium: 4.6 mmol/L (ref 3.5–5.1)
SODIUM: 145 mmol/L (ref 135–145)

## 2017-12-16 LAB — GLUCOSE, CAPILLARY
GLUCOSE-CAPILLARY: 108 mg/dL — AB (ref 70–99)
Glucose-Capillary: 110 mg/dL — ABNORMAL HIGH (ref 70–99)

## 2017-12-16 MED ORDER — DEXTROMETHORPHAN POLISTIREX ER 30 MG/5ML PO SUER
30.0000 mg | Freq: Two times a day (BID) | ORAL | Status: DC
  Administered 2017-12-16 – 2017-12-17 (×3): 30 mg via ORAL
  Filled 2017-12-16 (×3): qty 5

## 2017-12-16 NOTE — Progress Notes (Signed)
PROGRESS NOTE  Brittney Li  ZOX:[id number]  DOB: 08/11/1929  DOA: 12/13/2017 PCP: Avon GullyFanta, Tesfaye, MD  Brief Admission Hx: Brittney Li is a 84 y.o. female with medical history significant diastolic congestive heart failure, chronic kidney disease, diabetes, hypertension, COPD, chronic respiratory failure on 3 L of oxygen at home called EMS today because she could not get up and walk around she was weak.  She does report she has been swelling her legs but she does not recall for how long.  Patient be referred for admission for congestive heart failure exacerbation.  She lives alone.  MDM/Assessment & Plan:   1. Acute exacerbation of chronic diastolic CHF -patient had a recent echocardiogram done in January 2019 which will not be repeated at this time.  Continue IV Lasix for diuresis.  She continues to diurese.  She says that she is feeling better.  Her legs are less edematous.  She has been wearing the TED hoses.  Monitor intake and output and daily weights.  Unfortunately the output readings haven't been measured accurately.  Continue supplemental oxygen.  Repeated chest xray 7/17 shows persistent pulmonary edema but slightly improved from admission.  2. Elevated troponins-suspect this is exacerbated by congestive heart failure but she does have a history of chronically elevated troponins up to 0.2.  They have remained stable. Do not suspect ACS.  3. Dementia- stable, follow closely. 4. Essential hypertension-resume home blood pressure medications. 5. Stage III CKD-we will follow closely in the setting of diuresis.  It is slightly improved today.  Repeat in the morning renal function panel.  DVT prophylaxis: SCDs Code Status: Full Family Communication: None present Disposition Plan: SNF in 1-2 days if continues to improve   Subjective: Patient reports that she does feel better today.  Her legs are going down and the swelling is getting much better.    Objective: Vitals:   12/16/17  0500 12/16/17 0609 12/16/17 0751 12/16/17 1206  BP:  (!) 107/48  120/69  Pulse:  60  73  Resp:  18  18  Temp:  97.8 F (36.6 C)  98.1 F (36.7 C)  TempSrc:  Oral  Oral  SpO2:  98% 93% 93%  Weight: 89.2 kg (196 lb 10.4 oz)     Height:        Intake/Output Summary (Last 24 hours) at 12/16/2017 1614 Last data filed at 12/16/2017 1300 Gross per 24 hour  Intake 603 ml  Output -  Net 603 ml   Filed Weights   12/13/17 1846 12/15/17 0541 12/16/17 0500  Weight: 85 kg (187 lb 6.3 oz) 82.6 kg (182 lb 1.6 oz) 89.2 kg (196 lb 10.4 oz)   REVIEW OF SYSTEMS  As per history otherwise all reviewed and reported negative  Exam:  General exam: Awake, alert, no apparent distress, cooperative and pleasant. Respiratory system: Clear. No increased work of breathing. Cardiovascular system: S1 & S2 heard.  Mild JVD, no murmurs, gallops. Gastrointestinal system: Abdomen is nondistended, soft and nontender. Normal bowel sounds heard. Central nervous system: Alert. No focal neurological deficits. Extremities: 1+ pitting edema bilateral lower extremities (improved from prior exams).  Data Reviewed: Basic Metabolic Panel: Recent Labs  Lab 12/13/17 1445 12/14/17 0625 12/15/17 0426 12/16/17 0459  NA 141 145 145 145  K 4.7 4.3 4.2 4.6  CL 95* 94* 97* 99  CO2 41* 45* 42* 43*  GLUCOSE 96 95 107* 105*  BUN 39* 38* 33* 32*  CREATININE 1.42* 1.46* 1.32* 1.26*  CALCIUM 8.5* 8.8*  8.5* 8.5*  MG  --   --  2.4  --    Liver Function Tests: No results for input(s): AST, ALT, ALKPHOS, BILITOT, PROT, ALBUMIN in the last 168 hours. No results for input(s): LIPASE, AMYLASE in the last 168 hours. No results for input(s): AMMONIA in the last 168 hours. CBC: Recent Labs  Lab 12/13/17 1445  WBC 4.6  HGB 11.0*  HCT 33.8*  MCV 90.1  PLT 81*   Cardiac Enzymes: Recent Labs  Lab 12/13/17 1919 12/13/17 2343 12/14/17 0625  TROPONINI 0.16* 0.17* 0.15*   CBG (last 3)  Recent Labs    12/16/17 0832  12/16/17 1100  GLUCAP 110* 108*   No results found for this or any previous visit (from the past 240 hour(s)).   Studies: Dg Chest Port 1 View  Result Date: 12/15/2017 CLINICAL DATA:  Pulmonary edema EXAM: PORTABLE CHEST 1 VIEW COMPARISON:  12/13/2017 FINDINGS: There is bilateral mild interstitial thickening. There are small bilateral pleural effusions. There is no pneumothorax. There is stable cardiomegaly. The osseous structures are unremarkable. IMPRESSION: 1. Mild pulmonary edema. Electronically Signed   By: Elige Ko   On: 12/15/2017 10:47   Scheduled Meds: . aspirin  81 mg Oral Daily  . atorvastatin  20 mg Oral q1800  . benazepril  2.5 mg Oral Daily  . dextromethorphan  30 mg Oral BID  . donepezil  10 mg Oral QHS  . famotidine  20 mg Oral Daily  . furosemide  60 mg Intravenous Q12H  . mouth rinse  15 mL Mouth Rinse BID  . mirtazapine  30 mg Oral QHS  . mometasone-formoterol  2 puff Inhalation BID  . multivitamin with minerals  1 tablet Oral Daily  . oxybutynin  5 mg Oral Daily  . potassium chloride SA  60 mEq Oral Daily  . sodium chloride flush  3 mL Intravenous Q12H  . traMADol  50 mg Oral BID   Continuous Infusions: . sodium chloride      Principal Problem:   Acute on chronic diastolic (congestive) heart failure (HCC) Active Problems:   Dementia   Essential hypertension   Acute on chronic respiratory failure with hypoxia (HCC)   CKD (chronic kidney disease) stage 3, GFR 30-59 ml/min (HCC)   Elevated troponin  Time spent:   Standley Dakins, MD, FAAFP [facility] Pager [phone]  If 7PM-7AM, please contact night-coverage [email or web address] Password [email or web address] 12/16/2017, 4:14 PM    LOS: 3 days

## 2017-12-17 ENCOUNTER — Inpatient Hospital Stay
Admission: RE | Admit: 2017-12-17 | Discharge: 2017-12-21 | Disposition: A | Discharge status: Other Acute Inpatient Hospital | Payer: Medicare Other | Source: Ambulatory Visit | Attending: Internal Medicine | Admitting: Internal Medicine

## 2017-12-17 DIAGNOSIS — N183 Chronic kidney disease, stage 3 (moderate): Secondary | ICD-10-CM | POA: Diagnosis present

## 2017-12-17 DIAGNOSIS — E873 Alkalosis: Secondary | ICD-10-CM | POA: Diagnosis not present

## 2017-12-17 DIAGNOSIS — R2681 Unsteadiness on feet: Secondary | ICD-10-CM | POA: Diagnosis not present

## 2017-12-17 DIAGNOSIS — R0902 Hypoxemia: Secondary | ICD-10-CM | POA: Diagnosis not present

## 2017-12-17 DIAGNOSIS — Z7982 Long term (current) use of aspirin: Secondary | ICD-10-CM | POA: Diagnosis not present

## 2017-12-17 DIAGNOSIS — R262 Difficulty in walking, not elsewhere classified: Secondary | ICD-10-CM | POA: Diagnosis not present

## 2017-12-17 DIAGNOSIS — J9621 Acute and chronic respiratory failure with hypoxia: Secondary | ICD-10-CM | POA: Diagnosis present

## 2017-12-17 DIAGNOSIS — I130 Hypertensive heart and chronic kidney disease with heart failure and stage 1 through stage 4 chronic kidney disease, or unspecified chronic kidney disease: Secondary | ICD-10-CM | POA: Diagnosis present

## 2017-12-17 DIAGNOSIS — R0602 Shortness of breath: Secondary | ICD-10-CM | POA: Diagnosis not present

## 2017-12-17 DIAGNOSIS — N179 Acute kidney failure, unspecified: Secondary | ICD-10-CM | POA: Diagnosis not present

## 2017-12-17 DIAGNOSIS — Z66 Do not resuscitate: Secondary | ICD-10-CM | POA: Diagnosis present

## 2017-12-17 DIAGNOSIS — I509 Heart failure, unspecified: Secondary | ICD-10-CM | POA: Diagnosis not present

## 2017-12-17 DIAGNOSIS — I5033 Acute on chronic diastolic (congestive) heart failure: Secondary | ICD-10-CM | POA: Diagnosis present

## 2017-12-17 DIAGNOSIS — G9340 Encephalopathy, unspecified: Secondary | ICD-10-CM | POA: Diagnosis not present

## 2017-12-17 DIAGNOSIS — I10 Essential (primary) hypertension: Secondary | ICD-10-CM | POA: Diagnosis not present

## 2017-12-17 DIAGNOSIS — R1312 Dysphagia, oropharyngeal phase: Secondary | ICD-10-CM | POA: Diagnosis not present

## 2017-12-17 DIAGNOSIS — J449 Chronic obstructive pulmonary disease, unspecified: Secondary | ICD-10-CM | POA: Diagnosis not present

## 2017-12-17 DIAGNOSIS — J42 Unspecified chronic bronchitis: Secondary | ICD-10-CM | POA: Diagnosis not present

## 2017-12-17 DIAGNOSIS — E119 Type 2 diabetes mellitus without complications: Secondary | ICD-10-CM | POA: Diagnosis not present

## 2017-12-17 DIAGNOSIS — E440 Moderate protein-calorie malnutrition: Secondary | ICD-10-CM | POA: Diagnosis present

## 2017-12-17 DIAGNOSIS — R042 Hemoptysis: Secondary | ICD-10-CM | POA: Diagnosis present

## 2017-12-17 DIAGNOSIS — I248 Other forms of acute ischemic heart disease: Secondary | ICD-10-CM | POA: Diagnosis present

## 2017-12-17 DIAGNOSIS — Z87891 Personal history of nicotine dependence: Secondary | ICD-10-CM | POA: Diagnosis not present

## 2017-12-17 DIAGNOSIS — Z9981 Dependence on supplemental oxygen: Secondary | ICD-10-CM | POA: Diagnosis not present

## 2017-12-17 DIAGNOSIS — J9611 Chronic respiratory failure with hypoxia: Secondary | ICD-10-CM | POA: Diagnosis not present

## 2017-12-17 DIAGNOSIS — J690 Pneumonitis due to inhalation of food and vomit: Secondary | ICD-10-CM | POA: Diagnosis present

## 2017-12-17 DIAGNOSIS — R600 Localized edema: Secondary | ICD-10-CM | POA: Diagnosis not present

## 2017-12-17 DIAGNOSIS — Y95 Nosocomial condition: Secondary | ICD-10-CM | POA: Diagnosis present

## 2017-12-17 DIAGNOSIS — M6281 Muscle weakness (generalized): Secondary | ICD-10-CM | POA: Diagnosis not present

## 2017-12-17 DIAGNOSIS — R579 Shock, unspecified: Secondary | ICD-10-CM | POA: Diagnosis present

## 2017-12-17 DIAGNOSIS — W19XXXA Unspecified fall, initial encounter: Secondary | ICD-10-CM | POA: Diagnosis not present

## 2017-12-17 DIAGNOSIS — M25511 Pain in right shoulder: Secondary | ICD-10-CM | POA: Diagnosis not present

## 2017-12-17 DIAGNOSIS — J90 Pleural effusion, not elsewhere classified: Secondary | ICD-10-CM | POA: Diagnosis not present

## 2017-12-17 DIAGNOSIS — Z7951 Long term (current) use of inhaled steroids: Secondary | ICD-10-CM | POA: Diagnosis not present

## 2017-12-17 DIAGNOSIS — Z6832 Body mass index (BMI) 32.0-32.9, adult: Secondary | ICD-10-CM | POA: Diagnosis not present

## 2017-12-17 DIAGNOSIS — R748 Abnormal levels of other serum enzymes: Secondary | ICD-10-CM | POA: Diagnosis not present

## 2017-12-17 DIAGNOSIS — I472 Ventricular tachycardia: Secondary | ICD-10-CM | POA: Diagnosis not present

## 2017-12-17 DIAGNOSIS — F0390 Unspecified dementia without behavioral disturbance: Secondary | ICD-10-CM | POA: Diagnosis not present

## 2017-12-17 DIAGNOSIS — I5030 Unspecified diastolic (congestive) heart failure: Secondary | ICD-10-CM | POA: Diagnosis not present

## 2017-12-17 DIAGNOSIS — J441 Chronic obstructive pulmonary disease with (acute) exacerbation: Secondary | ICD-10-CM | POA: Diagnosis present

## 2017-12-17 DIAGNOSIS — J189 Pneumonia, unspecified organism: Secondary | ICD-10-CM | POA: Diagnosis not present

## 2017-12-17 DIAGNOSIS — E785 Hyperlipidemia, unspecified: Secondary | ICD-10-CM | POA: Diagnosis not present

## 2017-12-17 DIAGNOSIS — J9622 Acute and chronic respiratory failure with hypercapnia: Secondary | ICD-10-CM | POA: Diagnosis present

## 2017-12-17 DIAGNOSIS — I4510 Unspecified right bundle-branch block: Secondary | ICD-10-CM | POA: Diagnosis present

## 2017-12-17 DIAGNOSIS — E875 Hyperkalemia: Secondary | ICD-10-CM | POA: Diagnosis not present

## 2017-12-17 DIAGNOSIS — E870 Hyperosmolality and hypernatremia: Secondary | ICD-10-CM | POA: Diagnosis not present

## 2017-12-17 DIAGNOSIS — E1122 Type 2 diabetes mellitus with diabetic chronic kidney disease: Secondary | ICD-10-CM | POA: Diagnosis present

## 2017-12-17 DIAGNOSIS — R41841 Cognitive communication deficit: Secondary | ICD-10-CM | POA: Diagnosis not present

## 2017-12-17 DIAGNOSIS — K920 Hematemesis: Secondary | ICD-10-CM | POA: Diagnosis present

## 2017-12-17 LAB — BASIC METABOLIC PANEL
ANION GAP: 4 — AB (ref 5–15)
BUN: 34 mg/dL — AB (ref 8–23)
CO2: 43 mmol/L — ABNORMAL HIGH (ref 22–32)
Calcium: 8.5 mg/dL — ABNORMAL LOW (ref 8.9–10.3)
Chloride: 97 mmol/L — ABNORMAL LOW (ref 98–111)
Creatinine, Ser: 1.31 mg/dL — ABNORMAL HIGH (ref 0.44–1.00)
GFR calc Af Amer: 41 mL/min — ABNORMAL LOW (ref >60)
GFR, EST NON AFRICAN AMERICAN: 36 mL/min — AB (ref >60)
GLUCOSE: 95 mg/dL (ref 70–99)
Potassium: 5.6 mmol/L — ABNORMAL HIGH (ref 3.5–5.1)
SODIUM: 144 mmol/L (ref 135–145)

## 2017-12-17 MED ORDER — SODIUM POLYSTYRENE SULFONATE 15 GM/60ML PO SUSP
30.0000 g | Freq: Once | ORAL | Status: AC
  Administered 2017-12-17: 30 g via ORAL
  Filled 2017-12-17: qty 120

## 2017-12-17 MED ORDER — POTASSIUM CHLORIDE CRYS ER 20 MEQ PO TBCR: 40.0000 meq | EXTENDED_RELEASE_TABLET | Freq: Every day | ORAL | 6 refills | Status: AC

## 2017-12-17 MED ORDER — POTASSIUM CHLORIDE CRYS ER 20 MEQ PO TBCR: 20.0000 meq | EXTENDED_RELEASE_TABLET | Freq: Every day | ORAL | Status: DC

## 2017-12-17 MED ORDER — TORSEMIDE 20 MG PO TABS: ORAL_TABLET | ORAL | 3 refills | Status: AC

## 2017-12-17 MED ORDER — FUROSEMIDE 10 MG/ML IJ SOLN
40.0000 mg | Freq: Once | INTRAMUSCULAR | Status: AC
  Administered 2017-12-17: 40 mg via INTRAVENOUS
  Filled 2017-12-17: qty 4

## 2017-12-17 MED ORDER — DEXTROMETHORPHAN POLISTIREX ER 30 MG/5ML PO SUER: 30.0000 mg | Freq: Two times a day (BID) | ORAL | 0 refills | Status: AC | PRN

## 2017-12-17 NOTE — Discharge Summary (Addendum)
Physician Discharge Summary  Charlton HawsMary A Sobh ZOX:[id number] DOB: 04/30/1930 DOA: 12/13/2017  PCP: Avon GullyFanta, Tesfaye, MD Cardiologist: Dr. Wyline MoodBranch  Admit date: 12/13/2017 Discharge date: 12/17/2017  Admitted From: Home  Disposition:  [facility]   Recommendations for Outpatient Follow-up:  1. Follow up with PCP in 2 weeks 2. Follow up with cardiologist in 2-3 weeks 3. Please check BMP/CBC in 1 week 4. Please monitor weights and report weight gain of 4-5 pounds to physician  Discharge Condition: STABLE   CODE STATUS: FULL   Brief Hospitalization Summary: Please see all hospital notes, images, labs for full details of the hospitalization.  HPI: Charlton HawsMary A Underhill is a 84 y.o. female with medical history significant diastolic congestive heart failure, chronic kidney disease, diabetes, hypertension, COPD, chronic respiratory failure on 3 L of oxygen at home called EMS today because she could not get up and walk around she was weak.  Patient has a history of dementia and does not exactly recall what happened but according to EMS when they arrived she was very short of breath and her oxygen tank was not working.  Her O2 sats were 74% on her 3 L but they were reporting that the oxygen was not flowing correctly and the compressor was not working.  Once they put her on their oxygen tank her O2 sats normalized.  She comes to the emergency department complaining of shortness of breath.  Patient denies any fever she denies any nausea vomiting diarrhea.  She is back to her normal respiratory status.  She does report she has been swelling her legs but she does not recall for how long.  Patient be referred for admission for congestive heart failure exacerbation.  She lives alone.  MDM/Assessment & Plan:   1. Acute exacerbation of chronic diastolic CHF -patient had a recent echocardiogram done in January 2019 which was not repeated.  She was treated with IV Lasix for diuresis.  She diuresed and felt better.  She  says that she is feeling better.  Her legs are less edematous.  She has been wearing the TED hoses.  Monitor intake and output and daily weights.  Unfortunately the output readings haven't been measured accurately.  Continue supplemental oxygen.  Repeated chest xray 7/17 shows improvement. Resume demadex 40 mg BID.  2. Elevated troponins-suspect this is exacerbated by congestive heart failure but she does have a history of chronically elevated troponins up to 0.2.  They have remained stable. Do not suspect ACS.  3. Hyperkalemia - reduced potassium supplement, held today's dose, gave kayexalate and IV lasix, repeat BMP in 1 week. Resume oral potassium supplement at lower dose on 12/19/17. 4. Dementia- stable, follow closely. 5. Essential hypertension-resume home blood pressure medications. 6. Stage III CKD-creatinine was monitored closely with diuresis and remained stable to slightly improved.  Repeat BMP in 1 week recommended.   DVT prophylaxis: SCDs Code Status: Full Family Communication: None present, patient updated at bedside Disposition Plan: SNF [facility]    Discharge Diagnoses:  Principal Problem:   Acute on chronic diastolic (congestive) heart failure (HCC) Active Problems:   Dementia   Essential hypertension   Hyperkalemia   Acute on chronic respiratory failure with hypoxia (HCC)   CKD (chronic kidney disease) stage 3, GFR 30-59 ml/min (HCC)   Elevated troponin  Discharge Instructions: Discharge Instructions    (HEART FAILURE PATIENTS) Call MD:  Anytime you have any of the following symptoms: 1) 3 pound weight gain in 24  hours or 5 pounds in 1 week 2) shortness of breath, with or without a dry hacking cough 3) swelling in the hands, feet or stomach 4) if you have to sleep on extra pillows at night in order to breathe.   Complete by:  As directed    Call MD for:  difficulty breathing, headache or visual disturbances   Complete by:  As directed    Call MD  for:  extreme fatigue   Complete by:  As directed    Call MD for:  persistant dizziness or light-headedness   Complete by:  As directed    Call MD for:  persistant nausea and vomiting   Complete by:  As directed    Diet - low sodium heart healthy   Complete by:  As directed    Increase activity slowly   Complete by:  As directed      Allergies as of 12/17/2017      Reactions   Erythromycin Other (See Comments)   Unknown    Fluviral [flu Virus Vaccine]    Penicillins Other (See Comments)   .Has patient had a PCN reaction causing immediate rash, facial/tongue/throat swelling, SOB or lightheadedness with hypotension: Unknown Has patient had a PCN reaction causing severe rash involving mucus membranes or skin necrosis:No Has patient had a PCN reaction that required hospitalization: Yes Has patient had a PCN reaction occurring within the last 10 years: No If all of the above answers are "NO", then may proceed with Cephalosporin use.      Medication List    STOP taking these medications   benazepril 5 MG tablet Commonly known as:  LOTENSIN   traMADol 50 MG tablet Commonly known as:  ULTRAM     TAKE these medications   albuterol (2.5 MG/3ML) 0.083% nebulizer solution Commonly known as:  PROVENTIL Take 2.5 mg by nebulization every 6 (six) hours as needed for wheezing. What changed:  Another medication with the same name was removed. Continue taking this medication, and follow the directions you see here.   aspirin 81 MG tablet Take 81 mg by mouth daily.   atorvastatin 20 MG tablet Commonly known as:  LIPITOR Take 20 mg by mouth daily at 6 PM.   dextromethorphan 30 MG/5ML liquid Commonly known as:  DELSYM Take 5 mLs (30 mg total) by mouth 2 (two) times daily as needed for cough.   docusate sodium 100 MG capsule Commonly known as:  COLACE Take 100 mg by mouth daily as needed for mild constipation or moderate constipation.   donepezil 10 MG tablet Commonly known as:   ARICEPT Take 10 mg by mouth at bedtime.   famotidine 20 MG tablet Commonly known as:  PEPCID Take 20 mg by mouth daily.   mirtazapine 30 MG tablet Commonly known as:  REMERON Take 30 mg by mouth at bedtime.   mometasone-formoterol 200-5 MCG/ACT Aero Commonly known as:  DULERA Inhale 2 puffs into the lungs 2 (two) times daily.   multivitamin with minerals Tabs tablet Take 1 tablet by mouth daily.   oxybutynin 5 MG tablet Commonly known as:  DITROPAN Take 5 mg by mouth daily.   potassium chloride SA 20 MEQ tablet Commonly known as:  K-DUR,KLOR-CON Take 2 tablets (40 mEq total) by mouth daily. Start taking on:  12/19/2017 What changed:    how much to take  These instructions start on 12/19/2017. If you are unsure what to do until then, ask your doctor or other care provider.  torsemide 20 MG tablet Commonly known as:  DEMADEX TAKE 40 MG twice daily What changed:  additional instructions      Contact information for after-discharge care    Destination    [facility] Preferred SNF .   Service:  Skilled Nursing Contact information: [address] [place] [address] [phone]             Allergies  Allergen Reactions  . Erythromycin Other (See Comments)    Unknown   . Fluviral [Flu Virus Vaccine]   . Penicillins Other (See Comments)    .Has patient had a PCN reaction causing immediate rash, facial/tongue/throat swelling, SOB or lightheadedness with hypotension: Unknown Has patient had a PCN reaction causing severe rash involving mucus membranes or skin necrosis:No Has patient had a PCN reaction that required hospitalization: Yes Has patient had a PCN reaction occurring within the last 10 years: No If all of the above answers are "NO", then may proceed with Cephalosporin use.    Allergies as of 12/17/2017      Reactions   Erythromycin Other (See Comments)   Unknown    Fluviral [flu Virus Vaccine]    Penicillins Other (See  Comments)   .Has patient had a PCN reaction causing immediate rash, facial/tongue/throat swelling, SOB or lightheadedness with hypotension: Unknown Has patient had a PCN reaction causing severe rash involving mucus membranes or skin necrosis:No Has patient had a PCN reaction that required hospitalization: Yes Has patient had a PCN reaction occurring within the last 10 years: No If all of the above answers are "NO", then may proceed with Cephalosporin use.      Medication List    STOP taking these medications   benazepril 5 MG tablet Commonly known as:  LOTENSIN   traMADol 50 MG tablet Commonly known as:  ULTRAM     TAKE these medications   albuterol (2.5 MG/3ML) 0.083% nebulizer solution Commonly known as:  PROVENTIL Take 2.5 mg by nebulization every 6 (six) hours as needed for wheezing. What changed:  Another medication with the same name was removed. Continue taking this medication, and follow the directions you see here.   aspirin 81 MG tablet Take 81 mg by mouth daily.   atorvastatin 20 MG tablet Commonly known as:  LIPITOR Take 20 mg by mouth daily at 6 PM.   dextromethorphan 30 MG/5ML liquid Commonly known as:  DELSYM Take 5 mLs (30 mg total) by mouth 2 (two) times daily as needed for cough.   docusate sodium 100 MG capsule Commonly known as:  COLACE Take 100 mg by mouth daily as needed for mild constipation or moderate constipation.   donepezil 10 MG tablet Commonly known as:  ARICEPT Take 10 mg by mouth at bedtime.   famotidine 20 MG tablet Commonly known as:  PEPCID Take 20 mg by mouth daily.   mirtazapine 30 MG tablet Commonly known as:  REMERON Take 30 mg by mouth at bedtime.   mometasone-formoterol 200-5 MCG/ACT Aero Commonly known as:  DULERA Inhale 2 puffs into the lungs 2 (two) times daily.   multivitamin with minerals Tabs tablet Take 1 tablet by mouth daily.   oxybutynin 5 MG tablet Commonly known as:  DITROPAN Take 5 mg by mouth daily.    potassium chloride SA 20 MEQ tablet Commonly known as:  K-DUR,KLOR-CON Take 2 tablets (40 mEq total) by mouth daily. Start taking on:  12/19/2017 What changed:    how much to take  These instructions start  on 12/19/2017. If you are unsure what to do until then, ask your doctor or other care provider.   torsemide 20 MG tablet Commonly known as:  DEMADEX TAKE 40 MG twice daily What changed:  additional instructions      Procedures/Studies: Dg Chest Port 1 View  Result Date: 12/15/2017 CLINICAL DATA:  Pulmonary edema EXAM: PORTABLE CHEST 1 VIEW COMPARISON:  12/13/2017 FINDINGS: There is bilateral mild interstitial thickening. There are small bilateral pleural effusions. There is no pneumothorax. There is stable cardiomegaly. The osseous structures are unremarkable. IMPRESSION: 1. Mild pulmonary edema. Electronically Signed   By: Elige Ko   On: 12/15/2017 10:47   Dg Chest Portable 1 View  Result Date: 12/13/2017 CLINICAL DATA:  84 year old presenting with acute onset of shortness of breath, generalized weakness and RIGHT shoulder pain. Current history of diabetes, hypertension, COPD, CHF, asthma and bronchitis. Former smoker. EXAM: PORTABLE CHEST 1 VIEW COMPARISON:  10/01/2017, 06/23/2017 and earlier, including CTA chest 10/03/2015. FINDINGS: Cardiac silhouette moderately enlarged, unchanged. Thoracic aorta atherosclerotic, unchanged. Prominent central pulmonary arteries, unchanged. Suboptimal inspiration which accounts for atelectasis in the lower lobes. Pulmonary venous hypertension with perhaps minimal to mild interstitial pulmonary edema. Lungs otherwise clear. BILATERAL pleural effusions suspected. IMPRESSION: 1. Stable cardiomegaly. Pulmonary venous hypertension and minimal to mild interstitial pulmonary edema is suspected, indicating minimal to mild CHF. 2. Suboptimal inspiration. BILATERAL lower lobe atelectasis. Small BILATERAL pleural effusions are suspected. Electronically Signed    By: Hulan Saas M.D.   On: 12/13/2017 14:36     Subjective: Pt says that she is feeling a lot better today. Her breathing has improved.  No chest pain and no SOB.   Discharge Exam: Vitals:   12/17/17 0742 12/17/17 1438  BP:  (!) 96/52  Pulse:  62  Resp:  16  Temp:  98.2 F (36.8 C)  SpO2: 95% 97%   Vitals:   12/17/17 0519 12/17/17 0600 12/17/17 0742 12/17/17 1438  BP: (!) 100/49 (!) 99/59  (!) 96/52  Pulse: 62 61  62  Resp: 20   16  Temp: 97.6 F (36.4 C)   98.2 F (36.8 C)  TempSrc: Oral   Oral  SpO2: 93%  95% 97%  Weight:  89.5 kg (197 lb 5 oz)    Height:       General exam: Awake, alert, no apparent distress, cooperative and pleasant. Respiratory system: Clear. No increased work of breathing. Cardiovascular system: S1 & S2 heard.  Mild JVD, no murmurs, gallops. Gastrointestinal system: Abdomen is nondistended, soft and nontender. Normal bowel sounds heard. Central nervous system: Alert. No focal neurological deficits. Extremities: 1+ pitting edema bilateral lower extremities (improved from prior exams).   The results of significant diagnostics from this hospitalization (including imaging, microbiology, ancillary and laboratory) are listed below for reference.    Microbiology: No results found for this or any previous visit (from the past 240 hour(s)).   Labs: BNP (last 3 results) Recent Labs    06/23/17 1259 10/01/17 1225 12/13/17 1445  BNP 749.0* 1,079.0* 1,051.0*   Basic Metabolic Panel: Recent Labs  Lab 12/13/17 1445 12/14/17 0625 12/15/17 0426 12/16/17 0459 12/17/17 0549  NA 141 145 145 145 144  K 4.7 4.3 4.2 4.6 5.6*  CL 95* 94* 97* 99 97*  CO2 41* 45* 42* 43* 43*  GLUCOSE 96 95 107* 105* 95  BUN 39* 38* 33* 32* 34*  CREATININE 1.42* 1.46* 1.32* 1.26* 1.31*  CALCIUM 8.5* 8.8* 8.5* 8.5* 8.5*  MG  --   --  2.4  --   --    Liver Function Tests: No results for input(s): AST, ALT, ALKPHOS, BILITOT, PROT, ALBUMIN in the last 168 hours. No  results for input(s): LIPASE, AMYLASE in the last 168 hours. No results for input(s): AMMONIA in the last 168 hours. CBC: Recent Labs  Lab 12/13/17 1445  WBC 4.6  HGB 11.0*  HCT 33.8*  MCV 90.1  PLT 81*   Cardiac Enzymes: Recent Labs  Lab 12/13/17 1919 12/13/17 2343 12/14/17 0625  TROPONINI 0.16* 0.17* 0.15*   BNP: Invalid input(s): POCBNP CBG: Recent Labs  Lab 12/16/17 0832 12/16/17 1100  GLUCAP 110* 108*   D-Dimer No results for input(s): DDIMER in the last 72 hours. Hgb A1c No results for input(s): HGBA1C in the last 72 hours. Lipid Profile No results for input(s): CHOL, HDL, LDLCALC, TRIG, CHOLHDL, LDLDIRECT in the last 72 hours. Thyroid function studies No results for input(s): TSH, T4TOTAL, T3FREE, THYROIDAB in the last 72 hours.  Invalid input(s): FREET3 Anemia work up No results for input(s): VITAMINB12, FOLATE, FERRITIN, TIBC, IRON, RETICCTPCT in the last 72 hours. Urinalysis    Component Value Date/Time   COLORURINE YELLOW 02/28/2013 0656   APPEARANCEUR CLEAR 02/28/2013 0656   LABSPEC 1.008 02/28/2013 0656   PHURINE 5.0 02/28/2013 0656   GLUCOSEU NEGATIVE 02/28/2013 0656   HGBUR MODERATE (A) 02/28/2013 0656   BILIRUBINUR NEGATIVE 02/28/2013 0656   KETONESUR NEGATIVE 02/28/2013 0656   PROTEINUR 30 (A) 02/28/2013 0656   UROBILINOGEN 1.0 02/28/2013 0656   NITRITE NEGATIVE 02/28/2013 0656   LEUKOCYTESUR SMALL (A) 02/28/2013 0656   Sepsis Labs Invalid input(s): PROCALCITONIN,  WBC,  LACTICIDVEN Microbiology No results found for this or any previous visit (from the past 240 hour(s)).  Time coordinating discharge: 36 minutes   SIGNED:  Standley Dakins, MD  [facility] 12/17/2017, 5:39 PM Pager [phone]  If 7PM-7AM, please contact night-coverage [email or web address] Password [email or web address]

## 2017-12-17 NOTE — Clinical Social Work Placement (Signed)
   CLINICAL SOCIAL WORK PLACEMENT  NOTE  Date:  12/17/2017  Patient Details  Name: Brittney Li MRN: [id number] Date of Birth: 07/21/1929  Clinical Social Work is seeking post-discharge placement for this patient at the Skilled  Nursing Facility level of care (*CSW will initial, date and re-position this form in  chart as items are completed):  Yes   Patient/family provided with [facility] Clinical Social Work Department's list of facilities offering this level of care within the geographic area requested by the patient (or if unable, by the patient's family).  Yes   Patient/family informed of their freedom to choose among providers that offer the needed level of care, that participate in Medicare, Medicaid or managed care program needed by the patient, have an available bed and are willing to accept the patient.  Yes   Patient/family informed of [facility]'s ownership interest in [facility] and [facility], as well as of the fact that they are under no obligation to receive care at these facilities.  PASRR submitted to EDS on       PASRR number received on       Existing PASRR number confirmed on 12/14/17     FL2 transmitted to all facilities in geographic area requested by pt/family on 12/14/17     FL2 transmitted to all facilities within larger geographic area on       Patient informed that his/her managed care company has contracts with or will negotiate with certain facilities, including the following:        Yes   Patient/family informed of bed offers received.  [facility]     Physician recommends and patient chooses bed at      [facility] to [facility] on 12/17/17.  Patient to be transferred to facility by wheelchair     Patient family notified on 12/17/17 of transfer.  Name of family member notified:  Karin GoldenLorraine (cousin)     PHYSICIAN       Additional Comment: [facility].  [facility] DC clinical sent through the hub. RN aware. She will call report and [facility]. No other CSW needs for dc.   _______________________________________________ Elliot GaultKathleen Cyan Clippinger, LCSW 12/17/2017, 11:56 AM

## 2017-12-18 ENCOUNTER — Telehealth: Payer: Self-pay | Admitting: Internal Medicine

## 2017-12-18 DIAGNOSIS — M150 Primary generalized (osteo)arthritis: Principal | ICD-10-CM

## 2017-12-18 DIAGNOSIS — M159 Polyosteoarthritis, unspecified: Secondary | ICD-10-CM

## 2017-12-18 DIAGNOSIS — M8949 Other hypertrophic osteoarthropathy, multiple sites: Secondary | ICD-10-CM

## 2017-12-18 MED ORDER — TRAMADOL HCL 50 MG PO TABS: 50.0000 mg | ORAL_TABLET | Freq: Two times a day (BID) | ORAL | 0 refills | Status: DC

## 2017-12-18 NOTE — Telephone Encounter (Signed)
[facility] stating that pt is in pain and requesting her tramadol 50mg  po bid.  She was taking it at hospital admission, but it's listed as stopped in the discharge summary.  Of note, there's no mention as to why it was stopped in the discharge summary.  Will send in a limited supply (6 tablets for 72 h supply) to the facility pharmacy until pt can be seen for rehab admission.    Andrius Andrepont L. Rhylee Nunn, D.O. Geriatrics [facility] [address] [place], [place] [address] Cell Phone (Mon-Fri 8am-5pm):  [phone] On Call:  [phone] & follow prompts after 5pm & weekends Office Phone:  [phone] Office Fax:  [phone]

## 2017-12-20 ENCOUNTER — Non-Acute Institutional Stay (SKILLED_NURSING_FACILITY): Payer: Medicare Other | Admitting: Internal Medicine

## 2017-12-20 ENCOUNTER — Encounter (HOSPITAL_COMMUNITY)
Admission: RE | Admit: 2017-12-20 | Discharge: 2017-12-20 | Disposition: A | Discharge status: Home / Self Care | Payer: Medicare Other | Source: Skilled Nursing Facility | Attending: Internal Medicine | Admitting: Internal Medicine

## 2017-12-20 ENCOUNTER — Encounter: Payer: Self-pay | Admitting: Internal Medicine

## 2017-12-20 DIAGNOSIS — M25511 Pain in right shoulder: Secondary | ICD-10-CM | POA: Diagnosis not present

## 2017-12-20 DIAGNOSIS — F0390 Unspecified dementia without behavioral disturbance: Secondary | ICD-10-CM | POA: Diagnosis not present

## 2017-12-20 DIAGNOSIS — I130 Hypertensive heart and chronic kidney disease with heart failure and stage 1 through stage 4 chronic kidney disease, or unspecified chronic kidney disease: Secondary | ICD-10-CM | POA: Insufficient documentation

## 2017-12-20 DIAGNOSIS — N183 Chronic kidney disease, stage 3 unspecified: Secondary | ICD-10-CM

## 2017-12-20 DIAGNOSIS — I5033 Acute on chronic diastolic (congestive) heart failure: Secondary | ICD-10-CM | POA: Diagnosis not present

## 2017-12-20 DIAGNOSIS — J9611 Chronic respiratory failure with hypoxia: Secondary | ICD-10-CM

## 2017-12-20 DIAGNOSIS — I5030 Unspecified diastolic (congestive) heart failure: Secondary | ICD-10-CM | POA: Insufficient documentation

## 2017-12-20 DIAGNOSIS — E785 Hyperlipidemia, unspecified: Secondary | ICD-10-CM | POA: Diagnosis not present

## 2017-12-20 DIAGNOSIS — J449 Chronic obstructive pulmonary disease, unspecified: Secondary | ICD-10-CM | POA: Insufficient documentation

## 2017-12-20 LAB — CBC WITH DIFFERENTIAL/PLATELET
BASOS ABS: 0 10*3/uL (ref 0.0–0.1)
Basophils Relative: 1 %
EOS PCT: 3 %
Eosinophils Absolute: 0.1 10*3/uL (ref 0.0–0.7)
HCT: 33.9 % — ABNORMAL LOW (ref 36.0–46.0)
HEMOGLOBIN: 10.7 g/dL — AB (ref 12.0–15.0)
Lymphocytes Relative: 22 %
Lymphs Abs: 1 10*3/uL (ref 0.7–4.0)
MCH: 29.1 pg (ref 26.0–34.0)
MCHC: 31.6 g/dL (ref 30.0–36.0)
MCV: 92.1 fL (ref 78.0–100.0)
MONO ABS: 0.6 10*3/uL (ref 0.1–1.0)
MONOS PCT: 12 %
Neutro Abs: 3 10*3/uL (ref 1.7–7.7)
Neutrophils Relative %: 62 %
Platelets: 86 10*3/uL — ABNORMAL LOW (ref 150–400)
RBC: 3.68 MIL/uL — ABNORMAL LOW (ref 3.87–5.11)
RDW: 14.4 % (ref 11.5–15.5)
WBC: 4.7 10*3/uL (ref 4.0–10.5)

## 2017-12-20 LAB — BASIC METABOLIC PANEL
Anion gap: 5 (ref 5–15)
BUN: 46 mg/dL — AB (ref 8–23)
CALCIUM: 8.8 mg/dL — AB (ref 8.9–10.3)
CHLORIDE: 96 mmol/L — AB (ref 98–111)
CO2: 42 mmol/L — AB (ref 22–32)
CREATININE: 1.64 mg/dL — AB (ref 0.44–1.00)
GFR calc non Af Amer: 27 mL/min — ABNORMAL LOW (ref >60)
GFR, EST AFRICAN AMERICAN: 32 mL/min — AB (ref >60)
GLUCOSE: 96 mg/dL (ref 70–99)
Potassium: 5 mmol/L (ref 3.5–5.1)
Sodium: 143 mmol/L (ref 135–145)

## 2017-12-20 NOTE — Progress Notes (Signed)
Provider: Einar Crow MD  Location:   [facility] Room Number: 131/P Place of Service:  SNF (31)  PCP: Avon Gully, MD Patient Care Team: Avon Gully, MD as PCP - General (Internal Medicine) Wyline Mood Dorothe Pea, MD as PCP - Cardiology (Cardiology)  Extended Emergency Contact Information Primary Emergency Contact: Ferguson,James Address: [address]          [place], [place] [address] D[place] of [place] Home Phone: [phone] Relation: Nephew Secondary Emergency Contact: Whitmore,Harold [place] Home Phone: [phone] Mobile Phone: [phone] Relation: Son  Code Status: Full Code Goals of Care: Advanced Directive information Advanced Directives 12/20/2017  Does Patient Have a Medical Advance Directive? Yes  Type of Advance Directive (No Data)  Does patient want to make changes to medical advance directive? No - Patient declined  Would patient like information on creating a medical advance directive? No - Patient declined  Pre-existing out of facility DNR order (yellow form or pink MOST form) -      Chief Complaint  Patient presents with  . New Admit To SNF    New Admission Visit    HPI: Patient is a 84 y.o. female seen today for admission to SNF for therapy after staying in the Hospital From 07/15-07/19 for Hypoxia and CHF.  Patient has h/o Diastolic CHF with EF of 50% in 01/19, CKD, Hypertension, COPD on Chronic Oxygen, Hyperlipidemia and Dementia  She lives by herself and said that she was feeling shortness of breath for last few days with increase edema in her legs and inability to walk.  She called EMS and was brought to the hospital.  Her O2 sats was 74% on 3 L.  X-ray showed mild pulmonary edema.  She was treated with IV Lasix.  She improved.  She was restarted on her home dose Demadex and was discharged to the facility for therapy. Patient continues to complain of shortness of breath, cough with white sputum.   She denies any chest pain, nausea, vomiting. She is also complaining of pain in her right shoulder.  She says that she fell at home. Patient is lives by  herself.  She walks with the walker.  She does have a caregiver who comes few hours a day.  Also has distant family who help her    Past Medical History:  Diagnosis Date  . Arthritis   . Asthma   . Bronchitis   . CHF (congestive heart failure) (HCC)    a. ECHO (01/2013) EF 60-65%, grade II diastolic dysfx, LA mildly dilated  . COPD (chronic obstructive pulmonary disease) (HCC)   . Diabetes mellitus without complication (HCC)   . Hypertension    Past Surgical History:  Procedure Laterality Date  . ABDOMINAL HYSTERECTOMY  1975  . hands      reports that she has quit smoking. She has never used smokeless tobacco. She reports that she does not drink alcohol or use drugs. Social History   Socioeconomic History  . Marital status: Divorced    Spouse name: Not on file  . Number of children: Not on file  . Years of education: 12th grade  . Highest education level: Not on file  Occupational History  . Occupation: retired    Associate Professor: RETIRED  Social Needs  . Financial resource strain: Not on file  . Food insecurity:    Worry: Not on file    Inability: Not on file  . Transportation needs:    Medical: Not on  file    Non-medical: Not on file  Tobacco Use  . Smoking status: Former Games developer  . Smokeless tobacco: Never Used  . Tobacco comment: quit smoking 1990  Substance and Sexual Activity  . Alcohol use: No  . Drug use: No  . Sexual activity: Not on file  Lifestyle  . Physical activity:    Days per week: Not on file    Minutes per session: Not on file  . Stress: Not on file  Relationships  . Social connections:    Talks on phone: Not on file    Gets together: Not on file    Attends religious service: Not on file    Active member of club or organization: Not on file    Attends meetings of clubs or organizations: Not on  file    Relationship status: Not on file  . Intimate partner violence:    Fear of current or ex partner: Not on file    Emotionally abused: Not on file    Physically abused: Not on file    Forced sexual activity: Not on file  Other Topics Concern  . Not on file  Social History Narrative  . Not on file    Functional Status Survey:    Family History  Problem Relation Age of Onset  . Arthritis Unknown   . Asthma Unknown   . Diabetes Unknown     Health Maintenance  Topic Date Due  . TETANUS/TDAP  01/20/2018 (Originally 09/02/1948)  . PNA vac Low Risk Adult (1 of 2 - PCV13) 01/20/2018 (Originally 09/01/1996)  . INFLUENZA VACCINE  12/30/2017  . DEXA SCAN  Completed    Allergies  Allergen Reactions  . Erythromycin Other (See Comments)    Unknown   . Fluviral [Flu Virus Vaccine]   . Penicillins Other (See Comments)    .Has patient had a PCN reaction causing immediate rash, facial/tongue/throat swelling, SOB or lightheadedness with hypotension: Unknown Has patient had a PCN reaction causing severe rash involving mucus membranes or skin necrosis:No Has patient had a PCN reaction that required hospitalization: Yes Has patient had a PCN reaction occurring within the last 10 years: No If all of the above answers are "NO", then may proceed with Cephalosporin use.     Allergies as of 12/20/2017      Reactions   Erythromycin Other (See Comments)   Unknown    Fluviral [flu Virus Vaccine]    Penicillins Other (See Comments)   .Has patient had a PCN reaction causing immediate rash, facial/tongue/throat swelling, SOB or lightheadedness with hypotension: Unknown Has patient had a PCN reaction causing severe rash involving mucus membranes or skin necrosis:No Has patient had a PCN reaction that required hospitalization: Yes Has patient had a PCN reaction occurring within the last 10 years: No If all of the above answers are "NO", then may proceed with Cephalosporin use.      Medication  List        Accurate as of 12/20/17  9:42 AM. Always use your most recent med list.          albuterol (2.5 MG/3ML) 0.083% nebulizer solution Commonly known as:  PROVENTIL Take 2.5 mg by nebulization every 6 (six) hours as needed for wheezing.   aspirin 81 MG tablet Take 81 mg by mouth daily.   atorvastatin 20 MG tablet Commonly known as:  LIPITOR Take 20 mg by mouth daily at 6 PM.   dextromethorphan 30 MG/5ML liquid Commonly known as:  DELSYM Take 5  mLs (30 mg total) by mouth 2 (two) times daily as needed for cough.   docusate sodium 100 MG capsule Commonly known as:  COLACE Take 100 mg by mouth daily as needed for mild constipation or moderate constipation.   donepezil 10 MG tablet Commonly known as:  ARICEPT Take 10 mg by mouth at bedtime.   famotidine 20 MG tablet Commonly known as:  PEPCID Take 20 mg by mouth daily.   mirtazapine 30 MG tablet Commonly known as:  REMERON Take 30 mg by mouth at bedtime.   mometasone-formoterol 200-5 MCG/ACT Aero Commonly known as:  DULERA Inhale 2 puffs into the lungs 2 (two) times daily.   multivitamin with minerals Tabs tablet Take 1 tablet by mouth daily.   oxybutynin 5 MG tablet Commonly known as:  DITROPAN Take 5 mg by mouth daily.   potassium chloride SA 20 MEQ tablet Commonly known as:  K-DUR,KLOR-CON Take 2 tablets (40 mEq total) by mouth daily.   torsemide 20 MG tablet Commonly known as:  DEMADEX TAKE 40 MG twice daily   traMADol 50 MG tablet Commonly known as:  ULTRAM Take 1 tablet (50 mg total) by mouth 2 (two) times daily for 3 days.       Review of Systems  Constitutional: Positive for activity change, appetite change and fatigue.  HENT: Negative.   Respiratory: Positive for cough and shortness of breath.   Cardiovascular: Positive for leg swelling. Negative for chest pain.  Gastrointestinal: Negative.   Genitourinary: Negative.   Musculoskeletal: Negative.   Skin: Negative.   Neurological:  Positive for weakness.  Psychiatric/Behavioral: Positive for sleep disturbance.    Vitals:   12/20/17 0935  BP: 106/61  Pulse: 63  Temp: 97.9 F (36.6 C)  TempSrc: Oral  SpO2: 97%   There is no height or weight on file to calculate BMI. Physical Exam  Constitutional: She is oriented to person, place, and time. She appears well-developed and well-nourished.  HENT:  Head: Normocephalic.  Positive for Thrush   Eyes: Pupils are equal, round, and reactive to light.  Neck: Neck supple.  Cardiovascular: Normal rate and regular rhythm.  No murmur heard. Pulmonary/Chest: Effort normal.  Had Bilateral Rales  Abdominal: Soft. Bowel sounds are normal. She exhibits no distension. There is no tenderness. There is no guarding.  Musculoskeletal:  Mild Edema Bilateral Has significant pain in Right [place]oulder  Lymphadenopathy:    She has no cervical adenopathy.  Neurological: She is alert and oriented to person, place, and time.  No Focal Deficits  Skin: Skin is warm and dry.  Psychiatric: She has a normal mood and affect. Her behavior is normal. Thought content normal.    Labs reviewed: Basic Metabolic Panel: Recent Labs    10/05/17 0546  12/15/17 0426 12/16/17 0459 12/17/17 0549 12/20/17 0700  NA 141   < > 145 145 144 143  K 3.7   < > 4.2 4.6 5.6* 5.0  CL 87*   < > 97* 99 97* 96*  CO2 48*   < > 42* 43* 43* 42*  GLUCOSE 94   < > 107* 105* 95 96  BUN 29*   < > 33* 32* 34* 46*  CREATININE 1.26*   < > 1.32* 1.26* 1.31* 1.64*  CALCIUM 8.9   < > 8.5* 8.5* 8.5* 8.8*  MG 2.0  --  2.4  --   --   --    < > = values in this interval not displayed.   Liver Function  Tests: Recent Labs    10/01/17 1225  AST 50*  ALT 38  ALKPHOS 87  BILITOT 1.1  PROT 7.1  ALBUMIN 3.6   No results for input(s): LIPASE, AMYLASE in the last 8760 hours. No results for input(s): AMMONIA in the last 8760 hours. CBC: Recent Labs    06/25/17 0426  10/01/17 1225 12/13/17 1445 12/20/17 0700  WBC 4.9    < > 4.9 4.6 4.7  NEUTROABS 2.5  --  3.7  --  3.0  HGB 11.9*   < > 11.5* 11.0* 10.7*  HCT 36.1   < > 35.5* 33.8* 33.9*  MCV 87.0   < > 90.3 90.1 92.1  PLT 107*   < > 88* 81* 86*   < > = values in this interval not displayed.   Cardiac Enzymes: Recent Labs    12/13/17 1919 12/13/17 2343 12/14/17 0625  TROPONINI 0.16* 0.17* 0.15*   BNP: Invalid input(s): POCBNP Lab Results  Component Value Date   HGBA1C 6.3 12/12/2008   Lab Results  Component Value Date   TSH 1.445 02/28/2013   Lab Results  Component Value Date   VITAMINB12 565 06/25/2017   No results found for: FOLATE No results found for: IRON, TIBC, FERRITIN  Imaging and Procedures obtained prior to SNF admission: No results found.  Assessment/Plan  Acute on chronic diastolic (congestive) heart failure  Patient continues to c/o SOB. Will repeat Chest Xray. Will Increase her Demadex to 60 in morning and 40 in evening. Daily Weights.   Chronic respiratory failure with hypoxia with h/o COPD POX still runs Low on 3 L of Oxygen Will monitor  Also Continue on Bronchodilators.  CKD (chronic kidney disease) stage 3, GFR 30-59 ml/min  Creat stable Repeat BMP Right Shoulder Pain after Fall at home. Xray of Shoulder Continue Ultram BID  Mouth Thrush Start her on Nystatin  Dementia  Patient High Function Was independent in her ADL before this Plans to go to her Apartment Continue on Aricept Hyperlipidemia,  Follows with her PCP on Statin   Addendum Her Repeat Chest Xray in the Facility Showed CHF with Bilateral Effusions Will increase her Demadex to 60 mg BID Repeat BMP Right Shoulder Xray was negative for any Fracture Continue Ultram BID    Family/ staff Communication:   Labs/tests ordered: Total time spent in this patient care encounter was 45_ minutes; greater than 50% of the visit spent counseling patient, reviewing records , Labs and coordinating care for problems addressed at this  encounter.

## 2017-12-21 ENCOUNTER — Non-Acute Institutional Stay (SKILLED_NURSING_FACILITY): Payer: Medicare Other | Admitting: Internal Medicine

## 2017-12-21 ENCOUNTER — Encounter (HOSPITAL_COMMUNITY): Payer: Self-pay

## 2017-12-21 ENCOUNTER — Emergency Department (HOSPITAL_COMMUNITY): Payer: Medicare Other

## 2017-12-21 ENCOUNTER — Other Ambulatory Visit: Payer: Self-pay

## 2017-12-21 ENCOUNTER — Encounter: Payer: Self-pay | Admitting: Internal Medicine

## 2017-12-21 ENCOUNTER — Inpatient Hospital Stay (HOSPITAL_COMMUNITY)
Admission: EM | Admit: 2017-12-21 | Discharge: 2018-01-30 | DRG: 207 | Disposition: E | Payer: Medicare Other | Attending: Internal Medicine | Admitting: Internal Medicine

## 2017-12-21 DIAGNOSIS — G20 Parkinson's disease: Secondary | ICD-10-CM | POA: Diagnosis present

## 2017-12-21 DIAGNOSIS — R0602 Shortness of breath: Secondary | ICD-10-CM

## 2017-12-21 DIAGNOSIS — Z887 Allergy status to serum and vaccine status: Secondary | ICD-10-CM

## 2017-12-21 DIAGNOSIS — I130 Hypertensive heart and chronic kidney disease with heart failure and stage 1 through stage 4 chronic kidney disease, or unspecified chronic kidney disease: Secondary | ICD-10-CM | POA: Diagnosis present

## 2017-12-21 DIAGNOSIS — Z95828 Presence of other vascular implants and grafts: Secondary | ICD-10-CM

## 2017-12-21 DIAGNOSIS — G9340 Encephalopathy, unspecified: Secondary | ICD-10-CM | POA: Diagnosis not present

## 2017-12-21 DIAGNOSIS — J90 Pleural effusion, not elsewhere classified: Secondary | ICD-10-CM | POA: Diagnosis not present

## 2017-12-21 DIAGNOSIS — I472 Ventricular tachycardia: Secondary | ICD-10-CM | POA: Diagnosis not present

## 2017-12-21 DIAGNOSIS — J9611 Chronic respiratory failure with hypoxia: Secondary | ICD-10-CM | POA: Diagnosis not present

## 2017-12-21 DIAGNOSIS — Z880 Allergy status to penicillin: Secondary | ICD-10-CM

## 2017-12-21 DIAGNOSIS — J9622 Acute and chronic respiratory failure with hypercapnia: Secondary | ICD-10-CM | POA: Diagnosis present

## 2017-12-21 DIAGNOSIS — I4510 Unspecified right bundle-branch block: Secondary | ICD-10-CM | POA: Diagnosis present

## 2017-12-21 DIAGNOSIS — I248 Other forms of acute ischemic heart disease: Secondary | ICD-10-CM | POA: Diagnosis present

## 2017-12-21 DIAGNOSIS — F0390 Unspecified dementia without behavioral disturbance: Secondary | ICD-10-CM | POA: Diagnosis not present

## 2017-12-21 DIAGNOSIS — Z9889 Other specified postprocedural states: Secondary | ICD-10-CM

## 2017-12-21 DIAGNOSIS — J9601 Acute respiratory failure with hypoxia: Secondary | ICD-10-CM | POA: Diagnosis not present

## 2017-12-21 DIAGNOSIS — I361 Nonrheumatic tricuspid (valve) insufficiency: Secondary | ICD-10-CM | POA: Diagnosis not present

## 2017-12-21 DIAGNOSIS — Z9981 Dependence on supplemental oxygen: Secondary | ICD-10-CM

## 2017-12-21 DIAGNOSIS — R042 Hemoptysis: Secondary | ICD-10-CM | POA: Diagnosis present

## 2017-12-21 DIAGNOSIS — N183 Chronic kidney disease, stage 3 unspecified: Secondary | ICD-10-CM

## 2017-12-21 DIAGNOSIS — J690 Pneumonitis due to inhalation of food and vomit: Principal | ICD-10-CM | POA: Diagnosis present

## 2017-12-21 DIAGNOSIS — I509 Heart failure, unspecified: Secondary | ICD-10-CM | POA: Diagnosis not present

## 2017-12-21 DIAGNOSIS — J189 Pneumonia, unspecified organism: Secondary | ICD-10-CM | POA: Diagnosis not present

## 2017-12-21 DIAGNOSIS — K219 Gastro-esophageal reflux disease without esophagitis: Secondary | ICD-10-CM | POA: Diagnosis present

## 2017-12-21 DIAGNOSIS — E1122 Type 2 diabetes mellitus with diabetic chronic kidney disease: Secondary | ICD-10-CM | POA: Diagnosis present

## 2017-12-21 DIAGNOSIS — Z452 Encounter for adjustment and management of vascular access device: Secondary | ICD-10-CM | POA: Diagnosis not present

## 2017-12-21 DIAGNOSIS — Z931 Gastrostomy status: Secondary | ICD-10-CM

## 2017-12-21 DIAGNOSIS — I5033 Acute on chronic diastolic (congestive) heart failure: Secondary | ICD-10-CM | POA: Diagnosis not present

## 2017-12-21 DIAGNOSIS — I10 Essential (primary) hypertension: Secondary | ICD-10-CM | POA: Diagnosis not present

## 2017-12-21 DIAGNOSIS — I4891 Unspecified atrial fibrillation: Secondary | ICD-10-CM | POA: Diagnosis present

## 2017-12-21 DIAGNOSIS — Z01818 Encounter for other preprocedural examination: Secondary | ICD-10-CM | POA: Diagnosis not present

## 2017-12-21 DIAGNOSIS — R778 Other specified abnormalities of plasma proteins: Secondary | ICD-10-CM | POA: Diagnosis present

## 2017-12-21 DIAGNOSIS — Z6832 Body mass index (BMI) 32.0-32.9, adult: Secondary | ICD-10-CM | POA: Diagnosis not present

## 2017-12-21 DIAGNOSIS — R579 Shock, unspecified: Secondary | ICD-10-CM | POA: Diagnosis present

## 2017-12-21 DIAGNOSIS — E873 Alkalosis: Secondary | ICD-10-CM | POA: Diagnosis not present

## 2017-12-21 DIAGNOSIS — R7989 Other specified abnormal findings of blood chemistry: Secondary | ICD-10-CM

## 2017-12-21 DIAGNOSIS — Z87891 Personal history of nicotine dependence: Secondary | ICD-10-CM

## 2017-12-21 DIAGNOSIS — Y95 Nosocomial condition: Secondary | ICD-10-CM | POA: Diagnosis present

## 2017-12-21 DIAGNOSIS — Z79899 Other long term (current) drug therapy: Secondary | ICD-10-CM

## 2017-12-21 DIAGNOSIS — N179 Acute kidney failure, unspecified: Secondary | ICD-10-CM | POA: Diagnosis not present

## 2017-12-21 DIAGNOSIS — Z7951 Long term (current) use of inhaled steroids: Secondary | ICD-10-CM

## 2017-12-21 DIAGNOSIS — Z4682 Encounter for fitting and adjustment of non-vascular catheter: Secondary | ICD-10-CM | POA: Diagnosis not present

## 2017-12-21 DIAGNOSIS — Z881 Allergy status to other antibiotic agents status: Secondary | ICD-10-CM

## 2017-12-21 DIAGNOSIS — E876 Hypokalemia: Secondary | ICD-10-CM | POA: Diagnosis present

## 2017-12-21 DIAGNOSIS — J42 Unspecified chronic bronchitis: Secondary | ICD-10-CM | POA: Diagnosis not present

## 2017-12-21 DIAGNOSIS — Z7982 Long term (current) use of aspirin: Secondary | ICD-10-CM

## 2017-12-21 DIAGNOSIS — R748 Abnormal levels of other serum enzymes: Secondary | ICD-10-CM | POA: Diagnosis not present

## 2017-12-21 DIAGNOSIS — J9690 Respiratory failure, unspecified, unspecified whether with hypoxia or hypercapnia: Secondary | ICD-10-CM | POA: Diagnosis not present

## 2017-12-21 DIAGNOSIS — M159 Polyosteoarthritis, unspecified: Secondary | ICD-10-CM

## 2017-12-21 DIAGNOSIS — E870 Hyperosmolality and hypernatremia: Secondary | ICD-10-CM | POA: Diagnosis not present

## 2017-12-21 DIAGNOSIS — J9600 Acute respiratory failure, unspecified whether with hypoxia or hypercapnia: Secondary | ICD-10-CM | POA: Diagnosis not present

## 2017-12-21 DIAGNOSIS — J441 Chronic obstructive pulmonary disease with (acute) exacerbation: Secondary | ICD-10-CM | POA: Diagnosis present

## 2017-12-21 DIAGNOSIS — E875 Hyperkalemia: Secondary | ICD-10-CM | POA: Diagnosis present

## 2017-12-21 DIAGNOSIS — I272 Pulmonary hypertension, unspecified: Secondary | ICD-10-CM | POA: Diagnosis present

## 2017-12-21 DIAGNOSIS — K922 Gastrointestinal hemorrhage, unspecified: Secondary | ICD-10-CM | POA: Diagnosis not present

## 2017-12-21 DIAGNOSIS — E611 Iron deficiency: Secondary | ICD-10-CM | POA: Diagnosis present

## 2017-12-21 DIAGNOSIS — Z8673 Personal history of transient ischemic attack (TIA), and cerebral infarction without residual deficits: Secondary | ICD-10-CM

## 2017-12-21 DIAGNOSIS — E440 Moderate protein-calorie malnutrition: Secondary | ICD-10-CM | POA: Diagnosis present

## 2017-12-21 DIAGNOSIS — J9621 Acute and chronic respiratory failure with hypoxia: Secondary | ICD-10-CM | POA: Diagnosis present

## 2017-12-21 DIAGNOSIS — Z66 Do not resuscitate: Secondary | ICD-10-CM | POA: Diagnosis present

## 2017-12-21 DIAGNOSIS — R112 Nausea with vomiting, unspecified: Secondary | ICD-10-CM | POA: Diagnosis not present

## 2017-12-21 DIAGNOSIS — F0280 Dementia in other diseases classified elsewhere without behavioral disturbance: Secondary | ICD-10-CM

## 2017-12-21 DIAGNOSIS — E785 Hyperlipidemia, unspecified: Secondary | ICD-10-CM | POA: Diagnosis present

## 2017-12-21 DIAGNOSIS — M150 Primary generalized (osteo)arthritis: Principal | ICD-10-CM

## 2017-12-21 DIAGNOSIS — R918 Other nonspecific abnormal finding of lung field: Secondary | ICD-10-CM | POA: Diagnosis not present

## 2017-12-21 DIAGNOSIS — K920 Hematemesis: Secondary | ICD-10-CM | POA: Diagnosis not present

## 2017-12-21 DIAGNOSIS — Z789 Other specified health status: Secondary | ICD-10-CM

## 2017-12-21 DIAGNOSIS — R0902 Hypoxemia: Secondary | ICD-10-CM | POA: Diagnosis not present

## 2017-12-21 DIAGNOSIS — D696 Thrombocytopenia, unspecified: Secondary | ICD-10-CM | POA: Diagnosis present

## 2017-12-21 DIAGNOSIS — Z90710 Acquired absence of both cervix and uterus: Secondary | ICD-10-CM

## 2017-12-21 DIAGNOSIS — I493 Ventricular premature depolarization: Secondary | ICD-10-CM | POA: Diagnosis not present

## 2017-12-21 DIAGNOSIS — D631 Anemia in chronic kidney disease: Secondary | ICD-10-CM | POA: Diagnosis present

## 2017-12-21 HISTORY — DX: Hyperkalemia: E87.5

## 2017-12-21 HISTORY — DX: Respiratory failure, unspecified, unspecified whether with hypoxia or hypercapnia: J96.90

## 2017-12-21 HISTORY — DX: Disorder of kidney and ureter, unspecified: N28.9

## 2017-12-21 HISTORY — DX: Unspecified dementia, unspecified severity, without behavioral disturbance, psychotic disturbance, mood disturbance, and anxiety: F03.90

## 2017-12-21 LAB — CBC WITH DIFFERENTIAL/PLATELET
BASOS PCT: 0 %
Basophils Absolute: 0 10*3/uL (ref 0.0–0.1)
EOS ABS: 0 10*3/uL (ref 0.0–0.7)
EOS PCT: 1 %
HCT: 34.9 % — ABNORMAL LOW (ref 36.0–46.0)
HEMOGLOBIN: 11.3 g/dL — AB (ref 12.0–15.0)
LYMPHS ABS: 0.7 10*3/uL (ref 0.7–4.0)
Lymphocytes Relative: 11 %
MCH: 29.5 pg (ref 26.0–34.0)
MCHC: 32.4 g/dL (ref 30.0–36.0)
MCV: 91.1 fL (ref 78.0–100.0)
MONOS PCT: 7 %
Monocytes Absolute: 0.5 10*3/uL (ref 0.1–1.0)
NEUTROS PCT: 81 %
Neutro Abs: 5.1 10*3/uL (ref 1.7–7.7)
PLATELETS: 104 10*3/uL — AB (ref 150–400)
RBC: 3.83 MIL/uL — AB (ref 3.87–5.11)
RDW: 14.3 % (ref 11.5–15.5)
WBC: 6.2 10*3/uL (ref 4.0–10.5)

## 2017-12-21 LAB — I-STAT CG4 LACTIC ACID, ED: LACTIC ACID, VENOUS: 1.66 mmol/L (ref 0.5–1.9)

## 2017-12-21 LAB — COMPREHENSIVE METABOLIC PANEL
ALK PHOS: 84 U/L (ref 38–126)
ALT: 30 U/L (ref 0–44)
AST: 40 U/L (ref 15–41)
Albumin: 3.2 g/dL — ABNORMAL LOW (ref 3.5–5.0)
Anion gap: 7 (ref 5–15)
BILIRUBIN TOTAL: 0.8 mg/dL (ref 0.3–1.2)
BUN: 55 mg/dL — AB (ref 8–23)
CALCIUM: 9 mg/dL (ref 8.9–10.3)
CO2: 39 mmol/L — ABNORMAL HIGH (ref 22–32)
CREATININE: 1.73 mg/dL — AB (ref 0.44–1.00)
Chloride: 96 mmol/L — ABNORMAL LOW (ref 98–111)
GFR, EST AFRICAN AMERICAN: 30 mL/min — AB (ref >60)
GFR, EST NON AFRICAN AMERICAN: 26 mL/min — AB (ref >60)
Glucose, Bld: 127 mg/dL — ABNORMAL HIGH (ref 70–99)
Potassium: 5.7 mmol/L — ABNORMAL HIGH (ref 3.5–5.1)
Sodium: 142 mmol/L (ref 135–145)
Total Protein: 6.9 g/dL (ref 6.5–8.1)

## 2017-12-21 LAB — BRAIN NATRIURETIC PEPTIDE: B Natriuretic Peptide: 1977 pg/mL — ABNORMAL HIGH (ref 0.0–100.0)

## 2017-12-21 LAB — MRSA PCR SCREENING: MRSA by PCR: NEGATIVE

## 2017-12-21 LAB — I-STAT TROPONIN, ED: TROPONIN I, POC: 0.34 ng/mL — AB (ref 0.00–0.08)

## 2017-12-21 LAB — TROPONIN I: TROPONIN I: 0.27 ng/mL — AB (ref <0.03)

## 2017-12-21 MED ORDER — VANCOMYCIN HCL IN DEXTROSE 1-5 GM/200ML-% IV SOLN
1000.0000 mg | INTRAVENOUS | Status: DC
  Administered 2017-12-22: 1000 mg via INTRAVENOUS
  Filled 2017-12-21 (×2): qty 200

## 2017-12-21 MED ORDER — TRAMADOL HCL 50 MG PO TABS: 50.0000 mg | ORAL_TABLET | Freq: Two times a day (BID) | ORAL | 0 refills | Status: AC

## 2017-12-21 MED ORDER — SODIUM CHLORIDE 0.9 % IV SOLN
2.0000 g | Freq: Once | INTRAVENOUS | Status: AC
  Administered 2017-12-21: 2 g via INTRAVENOUS
  Filled 2017-12-21: qty 2

## 2017-12-21 MED ORDER — SODIUM CHLORIDE 0.9 % IV SOLN
1.0000 g | Freq: Three times a day (TID) | INTRAVENOUS | Status: DC
  Administered 2017-12-22 – 2017-12-24 (×8): 1 g via INTRAVENOUS
  Filled 2017-12-21 (×12): qty 1

## 2017-12-21 MED ORDER — FAMOTIDINE 20 MG PO TABS
20.0000 mg | ORAL_TABLET | Freq: Every day | ORAL | Status: DC
  Administered 2017-12-22: 20 mg via ORAL
  Filled 2017-12-21 (×2): qty 1

## 2017-12-21 MED ORDER — FUROSEMIDE 10 MG/ML IJ SOLN
60.0000 mg | Freq: Two times a day (BID) | INTRAMUSCULAR | Status: DC
  Administered 2017-12-22: 60 mg via INTRAVENOUS
  Filled 2017-12-21: qty 6

## 2017-12-21 MED ORDER — MIRTAZAPINE 15 MG PO TABS
30.0000 mg | ORAL_TABLET | Freq: Every day | ORAL | Status: DC
  Administered 2017-12-21 – 2018-01-01 (×12): 30 mg via ORAL
  Filled 2017-12-21 (×3): qty 1
  Filled 2017-12-21: qty 2
  Filled 2017-12-21 (×5): qty 1
  Filled 2017-12-21: qty 2
  Filled 2017-12-21 (×2): qty 1

## 2017-12-21 MED ORDER — ASPIRIN 81 MG PO CHEW
81.0000 mg | CHEWABLE_TABLET | Freq: Every day | ORAL | Status: DC
  Administered 2017-12-22: 81 mg via ORAL
  Filled 2017-12-21 (×2): qty 1

## 2017-12-21 MED ORDER — ATORVASTATIN CALCIUM 20 MG PO TABS
20.0000 mg | ORAL_TABLET | Freq: Every day | ORAL | Status: DC
  Administered 2017-12-21: 20 mg via ORAL
  Filled 2017-12-21: qty 1

## 2017-12-21 MED ORDER — NYSTATIN 100000 UNIT/ML MT SUSP
5.0000 mL | Freq: Four times a day (QID) | OROMUCOSAL | Status: DC
  Administered 2017-12-21 – 2017-12-31 (×37): 500000 [IU] via OROMUCOSAL
  Filled 2017-12-21 (×39): qty 5

## 2017-12-21 MED ORDER — ALBUTEROL (5 MG/ML) CONTINUOUS INHALATION SOLN
10.0000 mg/h | INHALATION_SOLUTION | RESPIRATORY_TRACT | Status: DC
  Administered 2017-12-21: 10 mg/h via RESPIRATORY_TRACT
  Filled 2017-12-21: qty 20

## 2017-12-21 MED ORDER — ALBUTEROL SULFATE (2.5 MG/3ML) 0.083% IN NEBU: 2.5000 mg | INHALATION_SOLUTION | Freq: Four times a day (QID) | RESPIRATORY_TRACT | Status: DC | PRN

## 2017-12-21 MED ORDER — ORAL CARE MOUTH RINSE
15.0000 mL | Freq: Two times a day (BID) | OROMUCOSAL | Status: DC
  Administered 2017-12-21 – 2017-12-22 (×3): 15 mL via OROMUCOSAL

## 2017-12-21 MED ORDER — FUROSEMIDE 10 MG/ML IJ SOLN
60.0000 mg | Freq: Once | INTRAMUSCULAR | Status: AC
  Administered 2017-12-21: 60 mg via INTRAVENOUS
  Filled 2017-12-21: qty 6

## 2017-12-21 MED ORDER — DOCUSATE SODIUM 100 MG PO CAPS
100.0000 mg | ORAL_CAPSULE | Freq: Every day | ORAL | Status: DC | PRN
  Filled 2017-12-21: qty 1

## 2017-12-21 MED ORDER — MOMETASONE FURO-FORMOTEROL FUM 200-5 MCG/ACT IN AERO
2.0000 | INHALATION_SPRAY | Freq: Two times a day (BID) | RESPIRATORY_TRACT | Status: DC
  Administered 2017-12-22: 2 via RESPIRATORY_TRACT
  Filled 2017-12-21 (×2): qty 8.8

## 2017-12-21 MED ORDER — METHYLPREDNISOLONE SODIUM SUCC 125 MG IJ SOLR
125.0000 mg | Freq: Once | INTRAMUSCULAR | Status: AC
  Administered 2017-12-21: 125 mg via INTRAVENOUS
  Filled 2017-12-21: qty 2

## 2017-12-21 MED ORDER — SODIUM CHLORIDE 0.9 % IV SOLN
250.0000 mL | INTRAVENOUS | Status: DC | PRN
  Administered 2017-12-25: 250 mL via INTRAVENOUS

## 2017-12-21 MED ORDER — DONEPEZIL HCL 10 MG PO TABS
10.0000 mg | ORAL_TABLET | Freq: Every day | ORAL | Status: DC
  Administered 2017-12-21 – 2018-01-02 (×13): 10 mg via ORAL
  Filled 2017-12-21: qty 2
  Filled 2017-12-21 (×12): qty 1

## 2017-12-21 MED ORDER — ADULT MULTIVITAMIN W/MINERALS CH
1.0000 | ORAL_TABLET | Freq: Every day | ORAL | Status: DC
  Administered 2017-12-22: 1 via ORAL
  Filled 2017-12-21 (×5): qty 1

## 2017-12-21 MED ORDER — TRAMADOL HCL 50 MG PO TABS
50.0000 mg | ORAL_TABLET | Freq: Two times a day (BID) | ORAL | Status: DC
  Administered 2017-12-21 – 2017-12-22 (×2): 50 mg via ORAL
  Filled 2017-12-21 (×2): qty 1

## 2017-12-21 MED ORDER — SODIUM CHLORIDE 0.9% FLUSH: 3.0000 mL | INTRAVENOUS | Status: DC | PRN

## 2017-12-21 MED ORDER — OXYBUTYNIN CHLORIDE ER 5 MG PO TB24
5.0000 mg | ORAL_TABLET | Freq: Every evening | ORAL | Status: DC
  Administered 2017-12-21: 5 mg via ORAL
  Filled 2017-12-21 (×4): qty 1

## 2017-12-21 MED ORDER — VANCOMYCIN HCL 10 G IV SOLR
1750.0000 mg | Freq: Once | INTRAVENOUS | Status: AC
  Administered 2017-12-21: 1750 mg via INTRAVENOUS
  Filled 2017-12-21: qty 1750

## 2017-12-21 MED ORDER — ALBUTEROL SULFATE (2.5 MG/3ML) 0.083% IN NEBU: 5.0000 mg | INHALATION_SOLUTION | Freq: Once | RESPIRATORY_TRACT | Status: DC

## 2017-12-21 MED ORDER — DEXTROMETHORPHAN POLISTIREX ER 30 MG/5ML PO SUER
30.0000 mg | Freq: Two times a day (BID) | ORAL | Status: DC | PRN
  Filled 2017-12-21: qty 5

## 2017-12-21 MED ORDER — SODIUM CHLORIDE 0.9% FLUSH
3.0000 mL | Freq: Two times a day (BID) | INTRAVENOUS | Status: DC
  Administered 2017-12-21 – 2017-12-28 (×6): 3 mL via INTRAVENOUS

## 2017-12-21 MED ORDER — ENOXAPARIN SODIUM 30 MG/0.3ML ~~LOC~~ SOLN
30.0000 mg | SUBCUTANEOUS | Status: DC
  Administered 2017-12-21: 30 mg via SUBCUTANEOUS
  Filled 2017-12-21: qty 0.3

## 2017-12-21 MED ORDER — ACETAMINOPHEN 325 MG PO TABS
650.0000 mg | ORAL_TABLET | ORAL | Status: DC | PRN
  Administered 2017-12-23 – 2017-12-28 (×2): 650 mg via ORAL
  Filled 2017-12-21 (×2): qty 2

## 2017-12-21 MED ORDER — ONDANSETRON HCL 4 MG/2ML IJ SOLN
4.0000 mg | Freq: Four times a day (QID) | INTRAMUSCULAR | Status: DC | PRN
Start: 2017-12-21 — End: 2018-01-03
  Administered 2017-12-24: 4 mg via INTRAVENOUS
  Filled 2017-12-21: qty 2

## 2017-12-21 NOTE — ED Notes (Signed)
MD Haviland notified I-stat Troponin delay d/t cartridges not working. I -stat Trop 0.34

## 2017-12-21 NOTE — Progress Notes (Signed)
Pharmacy Antibiotic Note  Charlton HawsMary A Tryon is a 84 y.o. female admitted on 12/25/17 with pneumonia.  Pharmacy has been consulted for Vancomycin dosing.  Plan: Vancomycin 1750 mg IV x 1 dose Vancomycin 1000 mg IV every 24 hours.  Goal trough 15-20 mcg/mL.  Monitor labs, c/s, and vanco trough as indicated  Height: 5\' 4"  (162.6 cm) Weight: 188 lb (85.3 kg) IBW/kg (Calculated) : 54.7  Temp (24hrs), Avg:98.2 F (36.8 C), Min:97.8 F (36.6 C), Max:98.6 F (37 C)  Recent Labs  Lab 12/15/17 0426 12/16/17 0459 12/17/17 0549 12/20/17 0700 2018-01-23 1543 2018-01-23 1556  WBC  --   --   --  4.7 6.2  --   CREATININE 1.32* 1.26* 1.31* 1.64* 1.73*  --   LATICACIDVEN  --   --   --   --   --  1.66    Estimated Creatinine Clearance: 24.7 mL/min (A) (by C-G formula based on SCr of 1.73 mg/dL (H)).    Allergies  Allergen Reactions  . Erythromycin Other (See Comments)    Unknown   . Fluviral [Flu Virus Vaccine]   . Penicillins Other (See Comments)    .Has patient had a PCN reaction causing immediate rash, facial/tongue/throat swelling, SOB or lightheadedness with hypotension: Unknown Has patient had a PCN reaction causing severe rash involving mucus membranes or skin necrosis:No Has patient had a PCN reaction that required hospitalization: Yes Has patient had a PCN reaction occurring within the last 10 years: No If all of the above answers are "NO", then may proceed with Cephalosporin use.     Antimicrobials this admission: Vanco 7/23 >>  Aztreonam 7/23 x 1 dose  Dose adjustments this admission: N/A  Microbiology results: 7/23 BCx: pending  Thank you for allowing pharmacy to be a part of this patient's care.  Tad MooreSteven C Whitt Auletta 12/25/17 4:54 PM

## 2017-12-21 NOTE — ED Triage Notes (Signed)
Pt resident of [facility] currently.  Pt was admitted to [facility] recently for respiratory failure.  [facility] staff reports pt was sitting on side of bed doing some PT and o2 sat decreased to 60's.  Staff administered breathing treatment and sats increased to 80's.  After treatment, sat decreased to 70's.  Pt on home o2 4L continuous.  EMS arrived, pt laying in bed alert and oriented, sats 90% on 4liters.  Pt denies any pain.  Pt reports cough, productive at times.  Reports coughed up some blood last night and this morning.

## 2017-12-21 NOTE — Progress Notes (Signed)
Location:   [facility] Room Number: 131/P Place of Service:  SNF (31) Provider:  Einar Crow MD  Avon Gully, MD  Patient Care Team: Avon Gully, MD as PCP - General (Internal Medicine) Wyline Mood Dorothe Pea, MD as PCP - Cardiology (Cardiology)  Extended Emergency Contact Information Primary Emergency Contact: Ferguson,James Address: [address]          [place], [place] [address] [place] of [place] Home Phone: [phone] Relation: Nephew Secondary Emergency Contact: Whitmore,Harold [place] Home Phone: [phone] Mobile Phone: [phone] Relation: Son  Code Status:  Full Code Goals of care: Advanced Directive information Advanced Directives 2018-01-13  Does Patient Have a Medical Advance Directive? Yes  Type of Advance Directive Out of facility DNR (pink MOST or yellow form)  Does patient want to make changes to medical advance directive? No - Patient declined  Would patient like information on creating a medical advance directive? No - Patient declined  Pre-existing out of facility DNR order (yellow form or pink MOST form) -     Chief Complaint  Patient presents with  . Acute Visit    Patient is being seen for SOB and Hemoptysis ( Coughing up Blood)    HPI:  Pt is a 84 y.o. female seen today for an acute visit for continues SOB with POX in 65% on L With Respiratory Distress. Patient has a history of diastolic CHF with EF of 50%, CKD, hypertension, COPD on chronic oxygen and dementia //Patient was admitted in the hospital From -07/15-07/19 For Hypoxia and CHF. She was diuresed with IV Lasix and was started on home dose of Demadex and discharged to the facility for therapy.  Patient in the facility continued to complain of shortness of breath with cough.   She had a repeat chest x-ray done in the facility Yesterday which shows CHF with bilateral effusion.  Her Demadex was increased to 60 mg twice daily. But this  morning when therapy came to work with her she became extremely short of breath with pulse ox in the 60s on 3 L of oxygen.  She was given DuoNeb nebulizer.  She calmed down and her pulse ox picked up to 84%. She continues to complain of shortness of breath.  Especially when she gets up and works with therapy.  Past Medical History:  Diagnosis Date  . Arthritis   . Asthma   . Bronchitis   . CHF (congestive heart failure) (HCC)    a. ECHO (01/2013) EF 60-65%, grade II diastolic dysfx, LA mildly dilated  . COPD (chronic obstructive pulmonary disease) (HCC)   . Diabetes mellitus without complication (HCC)   . Hypertension    Past Surgical History:  Procedure Laterality Date  . ABDOMINAL HYSTERECTOMY  1975  . hands      Allergies  Allergen Reactions  . Erythromycin Other (See Comments)    Unknown   . Fluviral [Flu Virus Vaccine]   . Penicillins Other (See Comments)    .Has patient had a PCN reaction causing immediate rash, facial/tongue/throat swelling, SOB or lightheadedness with hypotension: Unknown Has patient had a PCN reaction causing severe rash involving mucus membranes or skin necrosis:No Has patient had a PCN reaction that required hospitalization: Yes Has patient had a PCN reaction occurring within the last 10 years: No If all of the above answers are "NO", then may proceed with Cephalosporin use.     Outpatient Encounter Medications as of 01/13/2018  Medication Sig  . albuterol (PROVENTIL) (  2.5 MG/3ML) 0.083% nebulizer solution Take 2.5 mg by nebulization every 6 (six) hours as needed for wheezing.  Marland Kitchen. aspirin 81 MG tablet Take 81 mg by mouth daily.    Marland Kitchen. atorvastatin (LIPITOR) 20 MG tablet Take 20 mg by mouth daily at 6 PM.  . dextromethorphan (DELSYM) 30 MG/5ML liquid Take 5 mLs (30 mg total) by mouth 2 (two) times daily as needed for cough.  . docusate sodium (COLACE) 100 MG capsule Take 100 mg by mouth daily as needed for mild constipation or moderate constipation.   Marland Kitchen.  donepezil (ARICEPT) 10 MG tablet Take 10 mg by mouth at bedtime.  . famotidine (PEPCID) 20 MG tablet Take 20 mg by mouth daily.  . mirtazapine (REMERON) 30 MG tablet Take 30 mg by mouth at bedtime.  . mometasone-formoterol (DULERA) 200-5 MCG/ACT AERO Inhale 2 puffs into the lungs 2 (two) times daily.  . Multiple Vitamin (MULTIVITAMIN WITH MINERALS) TABS tablet Take 1 tablet by mouth daily.  Marland Kitchen. nystatin (MYCOSTATIN) 100000 UNIT/ML suspension Take 5 mLs by mouth 4 (four) times daily.  Marland Kitchen. oxybutynin (DITROPAN) 5 MG tablet Take 5 mg by mouth daily.  . potassium chloride SA (K-DUR,KLOR-CON) 20 MEQ tablet Take 2 tablets (40 mEq total) by mouth daily.  Marland Kitchen. torsemide (DEMADEX) 20 MG tablet TAKE 40 MG twice daily  . traMADol (ULTRAM) 50 MG tablet Take 1 tablet (50 mg total) by mouth 2 (two) times daily for 3 days.   No facility-administered encounter medications on file as of 12/29/2017.      Review of Systems  Unable to perform ROS: Other    There is no immunization history for the selected administration types on file for this patient. Pertinent  Health Maintenance Due  Topic Date Due  . PNA vac Low Risk Adult (1 of 2 - PCV13) 01/20/2018 (Originally 09/01/1996)  . INFLUENZA VACCINE  12/30/2017  . DEXA SCAN  Completed   No flowsheet data found. Functional Status Survey:    Vitals:   12/01/2017 1417  BP: (!) 93/58  Pulse: 67  Resp: 20  Temp: 97.8 F (36.6 C)  TempSrc: Oral   There is no height or weight on file to calculate BMI. Physical Exam  Constitutional: She is oriented to person, place, and time. She appears well-developed.  HENT:  Head: Normocephalic.  Mouth/Throat: Oropharynx is clear and moist.  Eyes: Pupils are equal, round, and reactive to light.  Cardiovascular: Normal rate and regular rhythm.  Pulmonary/Chest:  Now Breathing Better was in Severe respiratory distress with therapy with Wheezing  Rales Bilateral.  Abdominal: Soft. Bowel sounds are normal. She exhibits no  distension. There is no tenderness. There is no guarding.  Musculoskeletal:  Trace edema  Neurological: She is alert and oriented to person, place, and time.  Skin: Skin is warm and dry.  Psychiatric: She has a normal mood and affect. Her behavior is normal. Thought content normal.    Labs reviewed: Recent Labs    10/05/17 0546  12/15/17 0426 12/16/17 0459 12/17/17 0549 12/20/17 0700  NA 141   < > 145 145 144 143  K 3.7   < > 4.2 4.6 5.6* 5.0  CL 87*   < > 97* 99 97* 96*  CO2 48*   < > 42* 43* 43* 42*  GLUCOSE 94   < > 107* 105* 95 96  BUN 29*   < > 33* 32* 34* 46*  CREATININE 1.26*   < > 1.32* 1.26* 1.31* 1.64*  CALCIUM 8.9   < >  8.5* 8.5* 8.5* 8.8*  MG 2.0  --  2.4  --   --   --    < > = values in this interval not displayed.   Recent Labs    10/01/17 1225  AST 50*  ALT 38  ALKPHOS 87  BILITOT 1.1  PROT 7.1  ALBUMIN 3.6   Recent Labs    06/25/17 0426  10/01/17 1225 12/13/17 1445 12/20/17 0700  WBC 4.9   < > 4.9 4.6 4.7  NEUTROABS 2.5  --  3.7  --  3.0  HGB 11.9*   < > 11.5* 11.0* 10.7*  HCT 36.1   < > 35.5* 33.8* 33.9*  MCV 87.0   < > 90.3 90.1 92.1  PLT 107*   < > 88* 81* 86*   < > = values in this interval not displayed.   Lab Results  Component Value Date   TSH 1.445 02/28/2013   Lab Results  Component Value Date   HGBA1C 6.3 12/12/2008   Lab Results  Component Value Date   CHOL 131 09/23/2015   HDL 75 09/23/2015   LDLCALC 40 09/23/2015   TRIG 80 09/23/2015   CHOLHDL 1.7 09/23/2015    Significant Diagnostic Results in last 30 days:  Dg Chest Port 1 View  Result Date: 12/15/2017 CLINICAL DATA:  Pulmonary edema EXAM: PORTABLE CHEST 1 VIEW COMPARISON:  12/13/2017 FINDINGS: There is bilateral mild interstitial thickening. There are small bilateral pleural effusions. There is no pneumothorax. There is stable cardiomegaly. The osseous structures are unremarkable. IMPRESSION: 1. Mild pulmonary edema. Electronically Signed   By: Elige Ko   On:  12/15/2017 10:47   Dg Chest Portable 1 View  Result Date: 12/13/2017 CLINICAL DATA:  84 year old presenting with acute onset of shortness of breath, generalized weakness and RIGHT shoulder pain. Current history of diabetes, hypertension, COPD, CHF, asthma and bronchitis. Former smoker. EXAM: PORTABLE CHEST 1 VIEW COMPARISON:  10/01/2017, 06/23/2017 and earlier, including CTA chest 10/03/2015. FINDINGS: Cardiac silhouette moderately enlarged, unchanged. Thoracic aorta atherosclerotic, unchanged. Prominent central pulmonary arteries, unchanged. Suboptimal inspiration which accounts for atelectasis in the lower lobes. Pulmonary venous hypertension with perhaps minimal to mild interstitial pulmonary edema. Lungs otherwise clear. BILATERAL pleural effusions suspected. IMPRESSION: 1. Stable cardiomegaly. Pulmonary venous hypertension and minimal to mild interstitial pulmonary edema is suspected, indicating minimal to mild CHF. 2. Suboptimal inspiration. BILATERAL lower lobe atelectasis. Small BILATERAL pleural effusions are suspected. Electronically Signed   By: Hulan Saas M.D.   On: 12/13/2017 14:36    Assessment/Plan  SOB with Hypoxia Patient is getting Severe Respiratory distress and Dropping her POX with Mild Exertion.Most likely due to  CHF and COPD Her resting POX is 85 % on 3l Her CO2 is also ellevated With her Low BP and inability to monitor her in the facility will send her to ED for further Evaluation.    Family/ staff Communication:   Labs/tests ordered:

## 2017-12-21 NOTE — Telephone Encounter (Signed)
RX Fax for [facility]@ [phone]  

## 2017-12-21 NOTE — ED Notes (Signed)
Respiratory paged concerning pt. Pt placed on NRB and O2 sat rose to 97%. On pt's normal 4L [place] pt is 80%.

## 2017-12-21 NOTE — ED Notes (Signed)
Report given to 300 RN at this time.  

## 2017-12-21 NOTE — ED Provider Notes (Signed)
[facility]ENCY [facility]MENT Provider Note   CSN: [id number] Arrival date & time: 12/02/2017  1515     History   Chief Complaint Chief Complaint  Patient presents with  . Shortness of Breath    HPI Charlton HawsMary A Ellers is a 84 y.o. female.  Pt presents to the ED today with SOB.  Pt was admitted to the hospital from 7/15-19 with hypoxia and CHF.  She was diuresed with IV lasix and [facility]  The pt had a cxr yesterday which showed CHF.  Her Demadex was increased to 60 mg bid.  The pt became very SOB today while working with the physical therapist.  Pulse ox dropped to 60s on 3L.  After a duoneb, O2 sat increased to 84%.  Pt does report a cough with some blood tinged sputum.  Pt denies any fevers.     Past Medical History:  Diagnosis Date  . Arthritis   . Asthma   . Bronchitis   . CHF (congestive heart failure) (HCC)    a. ECHO (01/2013) EF 60-65%, grade II diastolic dysfx, LA mildly dilated  . COPD (chronic obstructive pulmonary disease) (HCC)   . Dementia   . Diabetes mellitus without complication (HCC)   . Hyperkalemia   . Hypertension   . Renal disorder   . Respiratory failure [facility](HCC)     Patient Active Problem List   Diagnosis Date Noted  . Elevated troponin 12/13/2017  . Chronic respiratory failure with hypoxia (HCC) 10/01/2017  . Thrombocytopenia (HCC) 06/24/2017  . CHF exacerbation (HCC) 06/23/2017  . Cardiorenal syndrome with renal failure 03/02/2013  . Acute renal failure (ARF) (HCC) 02/28/2013  . Hyperkalemia 02/28/2013  . Acute on chronic respiratory failure with hypoxia (HCC) 02/28/2013  . CKD (chronic kidney disease) stage 3, GFR 30-59 ml/min (HCC) 02/28/2013  . Hypoglycemia 02/28/2013  . Acute on chronic diastolic (congestive) heart failure (HCC) 02/02/2013  . Impingement syndrome of left shoulder 09/21/2012  . Rotator cuff tear arthropathy 09/21/2012  . Arthritis of shoulder region, degenerative 09/21/2012  . Arthritis of hand, degenerative 02/17/2011   . Arthritis of wrist, left, degenerative 01/06/2011  . WRIST PAIN, LEFT 04/15/2010  . SHOULDER PAIN 11/08/2008  . IMPINGEMENT SYNDROME 11/08/2008  . TRIGGER FINGER, RIGHT THUMB 12/28/2006  . DEPRESSION 11/29/2006  . Dementia 11/05/2006  . CONSTIPATION, DRUG INDUCED 10/08/2006  . VERTIGO 09/10/2006  . HLD (hyperlipidemia) 04/27/2006  . SYNDROME, CARPAL TUNNEL 04/27/2006  . Essential hypertension 04/27/2006  . ALLERGIC RHINITIS 04/27/2006  . BRONCHITIS, CHRONIC NOS 04/27/2006  . Asthma 04/27/2006  . GERD 04/27/2006  . FIBROCYSTIC BREAST DISEASE 04/27/2006  . URINARY INCONTINENCE 04/27/2006  . CEREBROVASCULAR ACCIDENT, HX OF 04/27/2006    Past Surgical History:  Procedure Laterality Date  . ABDOMINAL HYSTERECTOMY  1975  . hands       OB History    Gravida  1   Para      Term      Preterm      AB      Living  1     SAB      TAB      Ectopic      Multiple      Live Births               Home Medications    Prior to Admission medications   Medication Sig Start Date End Date Taking? Authorizing Provider  albuterol (PROVENTIL) (2.5 MG/3ML) 0.083% nebulizer solution Take 2.5 mg by nebulization every  6 (six) hours as needed for wheezing.   Yes [provider]  aspirin 81 MG tablet Take 81 mg by mouth daily.     Yes [provider]  atorvastatin (LIPITOR) 20 MG tablet Take 20 mg by mouth daily at 6 PM.   Yes [provider]  dextromethorphan (DELSYM) 30 MG/5ML liquid Take 5 mLs (30 mg total) by mouth 2 (two) times daily as needed for cough. 12/17/17  Yes Johnson, Clanford L, MD  docusate sodium (COLACE) 100 MG capsule Take 100 mg by mouth daily as needed for mild constipation or moderate constipation.    Yes [provider]  donepezil (ARICEPT) 10 MG tablet Take 10 mg by mouth at bedtime.   Yes [provider]  famotidine (PEPCID) 20 MG tablet Take 20 mg by mouth daily.   Yes [provider]  mirtazapine  (REMERON) 30 MG tablet Take 30 mg by mouth at bedtime.   Yes [provider]  mometasone-formoterol (DULERA) 200-5 MCG/ACT AERO Inhale 2 puffs into the lungs 2 (two) times daily. 02/06/13  Yes Avon Gully, MD  Multiple Vitamin (MULTIVITAMIN WITH MINERALS) TABS tablet Take 1 tablet by mouth daily.   Yes [provider]  nystatin (MYCOSTATIN) 100000 UNIT/ML suspension Use as directed 5 mLs in the mouth or throat 4 (four) times daily. 7 DAY COURSE TO END ON 12/27/2017   Yes [provider]  oxybutynin (DITROPAN-XL) 5 MG 24 hr tablet Take 5 mg by mouth every evening.   Yes [provider]  potassium chloride SA (K-DUR,KLOR-CON) 20 MEQ tablet Take 2 tablets (40 mEq total) by mouth daily. 12/19/17  Yes Johnson, Clanford L, MD  torsemide (DEMADEX) 20 MG tablet TAKE 40 MG twice daily Patient taking differently: Take 60 mg by mouth 2 (two) times daily.  12/17/17  Yes Johnson, Clanford L, MD  traMADol (ULTRAM) 50 MG tablet Take 1 tablet (50 mg total) by mouth 2 (two) times daily for 3 days. 01/13/2018 12/24/17 Yes Roena Malady, PA-C    Family History Family History  Problem Relation Age of Onset  . Arthritis Unknown   . Asthma Unknown   . Diabetes Unknown     Social History Social History   Tobacco Use  . Smoking status: Former Games developer  . Smokeless tobacco: Never Used  . Tobacco comment: quit smoking 1990  Substance Use Topics  . Alcohol use: No  . Drug use: No     Allergies   Erythromycin; Fluviral [flu virus vaccine]; and Penicillins   Review of Systems Review of Systems  Respiratory: Positive for cough and shortness of breath.   All other systems reviewed and are negative.    Physical Exam Updated Vital Signs BP (!) 118/58 (BP Location: Right Arm)   Pulse 77   Temp 98.6 F (37 C) (Oral)   Resp (!) 22   Ht 5\' 4"  (1.626 m)   Wt 85.3 kg (188 lb)   SpO2 100%   BMI 32.27 kg/m   Physical Exam  Constitutional: She is oriented to person, place,  and time. She appears well-developed and well-nourished.  HENT:  Head: Normocephalic and atraumatic.  Mouth/Throat: Oropharynx is clear and moist.  Eyes: Pupils are equal, round, and reactive to light. EOM are normal.  Neck: Normal range of motion. Neck supple.  Cardiovascular: Normal rate and regular rhythm.  Pulmonary/Chest: Effort normal. She has wheezes.  Abdominal: Soft. Bowel sounds are normal.  Musculoskeletal: Normal range of motion.  Right lower leg: Normal.       Left lower leg: Normal.  Neurological: She is alert and oriented to person, place, and time.  Skin: Skin is warm. Capillary refill takes less than 2 seconds.  Psychiatric: She has a normal mood and affect. Her behavior is normal.  Nursing note and vitals reviewed.    ED Treatments / Results  Labs (all labs ordered are listed, but only abnormal results are displayed) Labs Reviewed  BRAIN NATRIURETIC PEPTIDE - Abnormal; Notable for the following components:      Result Value   B Natriuretic Peptide 1,977.0 (*)    All other components within normal limits  COMPREHENSIVE METABOLIC PANEL - Abnormal; Notable for the following components:   Potassium 5.7 (*)    Chloride 96 (*)    CO2 39 (*)    Glucose, Bld 127 (*)    BUN 55 (*)    Creatinine, Ser 1.73 (*)    Albumin 3.2 (*)    GFR calc non Af Amer 26 (*)    GFR calc Af Amer 30 (*)    All other components within normal limits  CBC WITH DIFFERENTIAL/PLATELET - Abnormal; Notable for the following components:   RBC 3.83 (*)    Hemoglobin 11.3 (*)    HCT 34.9 (*)    Platelets 104 (*)    All other components within normal limits  CULTURE, BLOOD (ROUTINE X 2)  CULTURE, BLOOD (ROUTINE X 2)  I-STAT TROPONIN, ED  I-STAT CG4 LACTIC ACID, ED    EKG EKG Interpretation  Date/Time:  Tuesday December 21 2017 15:27:44 EDT Ventricular Rate:  90 PR Interval:    QRS Duration: 132 QT Interval:  401 QTC Calculation: 491 R Axis:   -79 Text Interpretation:  Right  bundle branch block Inferior infarct, old Lateral leads are also involved No significant change since last tracing Confirmed by Jacalyn Lefevre [phone]) on 12/07/2017 3:53:56 PM   Radiology Dg Chest Port 1 View  Result Date: 12/11/2017 CLINICAL DATA:  Progressive shortness of breath. EXAM: PORTABLE CHEST 1 VIEW COMPARISON:  One-view chest x-ray 12/15/2017. FINDINGS: The heart is enlarged. New asymmetric right upper lobe airspace disease is present. Bilateral pleural effusions and bibasilar airspace disease are relatively similar to the prior exam. Atherosclerotic changes are again noted at the aortic arch. IMPRESSION: 1. New right upper lobe pneumonia. 2. Similar appearance of bilateral pleural effusions and basilar airspace disease, likely atelectasis. There is underlying congestive heart failure. Electronically Signed   By: Marin Roberts M.D.   On: 12/19/2017 16:18    Procedures Procedures (including critical care time)  Medications Ordered in ED Medications  albuterol (PROVENTIL,VENTOLIN) solution continuous neb (10 mg/hr Nebulization New Bag/Given 12/16/2017 1551)  aztreonam (AZACTAM) 2 g in sodium chloride 0.9 % 100 mL IVPB (has no administration in time range)  methylPREDNISolone sodium succinate (SOLU-MEDROL) 125 mg/2 mL injection 125 mg (125 mg Intravenous Given 12/03/2017 1555)  furosemide (LASIX) injection 60 mg (60 mg Intravenous Given 12/10/2017 1555)     Initial Impression / Assessment and Plan / ED Course  I have reviewed the triage vital signs and the nursing notes.  Pertinent labs & imaging results that were available during my care of the patient were reviewed by me and considered in my medical decision making (see chart for details).     CRITICAL CARE Performed by: Jacalyn Lefevre   Total critical care time: 30 minutes  Critical care time was exclusive of separately billable procedures and treating other patients.  Critical care was necessary to treat or prevent  imminent or life-threatening deterioration.  Critical care was time spent personally by me on the following activities: development of treatment plan with patient and/or surrogate as well as nursing, discussions with consultants, evaluation of patient's response to treatment, examination of patient, obtaining history from patient or surrogate, ordering and performing treatments and interventions, ordering and review of laboratory studies, ordering and review of radiographic studies, pulse oximetry and re-evaluation of patient's condition.  Oxygen improving.  She was just d/c from the hospital on the 19th and has a new PNA, so I will treat her as HCAP.  Pt given IV aztreonam.  She was also given nebs and solumedrol.  Pt d/w Dr. Thayer Dallas ([facility]) for admission.  Final Clinical Impressions(s) / ED Diagnoses   Final diagnoses:  HCAP (healthcare-associated pneumonia)  Acute on chronic congestive heart failure, unspecified heart failure type (HCC)  Acute on chronic respiratory failure with hypoxia (HCC)  CKD (chronic kidney disease) stage 3, GFR 30-59 ml/min [facility])    ED Discharge Orders    None       Jacalyn Lefevre, MD 2017-12-31 1645

## 2017-12-21 NOTE — Progress Notes (Signed)
PHARMACY NOTE:  ANTIMICROBIAL RENAL DOSAGE ADJUSTMENT  Current antimicrobial regimen includes a mismatch between antimicrobial dosage and estimated renal function.  As per policy approved by the Pharmacy & Therapeutics and Medical Executive Committees, the antimicrobial dosage will be adjusted accordingly.  Current antimicrobial dosage:  Azactam 2 gram IV q8h  Indication: Pneumonia  Renal Function:  Estimated Creatinine Clearance: 24.6 mL/min (A) (by C-G formula based on SCr of 1.73 mg/dL (H)). []      On intermittent HD, scheduled: []      On CRRT    Antimicrobial dosage has been changed to:  Azactam 1 gram IV q8h  Additional comments:   Thank you for allowing pharmacy to be a part of this patient's care.  Claybon Jabsngel, Airam Runions G, [facility] CenterRPH 12/27/2017 8:49 PM

## 2017-12-21 NOTE — H&P (Addendum)
History and Physical    Brittney Li ZOX:[id number] DOB: 1929/09/18 DOA: December 27, 2017  PCP: Avon Gully, MD  Patient coming from: SNF  I have personally briefly reviewed patient's old medical records in [facility]  Chief Complaint: Shortness of breath  HPI: Brittney Li is a 84 y.o. female with medical history significant of CHF and dementia chronic respiratory failure presents with shortness of breath.  Patient currently resides at a nursing  facility.  She  had increasing shortness of breath and cough over the past few days.  Denies any fever but did feel warm today.  Does have some dementia so was not very extensive with her history.  Was found to have an increasing oxygen demand here in the ED.  Admits that she only uses 3 L and needed 4 L here to be near 90% at rest.  Patient was found to have changes suggestive of a right upper lobe pneumonia and CHF here today..  Patient was  discharge on July 19 for CHF.  Per report, She had changes of CHF on Cxr yesterday at her facility  and they  increased her Demadex but her symptoms continue to worsen.    (ED Course: Discussed case with Dr Particia Nearing.  Patient received IV antibiotics for hospital-acquired pneumonia, nebulizers and IV Lasix.  She is improving  Review of Systems: As per HPI otherwise 10 point review of systems negative. + Sob, cough all others reviewed and are otherwise neg other than those mentioned in HPI   Past Medical History:  Diagnosis Date  . Arthritis   . Asthma   . Bronchitis   . CHF (congestive heart failure) (HCC)    a. ECHO (01/2013) EF 60-65%, grade II diastolic dysfx, LA mildly dilated  . COPD (chronic obstructive pulmonary disease) (HCC)   . Dementia   . Diabetes mellitus without complication (HCC)   . Hyperkalemia   . Hypertension   . Renal disorder   . Respiratory failure [facility])     Past Surgical History:  Procedure Laterality Date  . ABDOMINAL HYSTERECTOMY  1975  . hands       reports that  she has quit smoking. She has never used smokeless tobacco. She reports that she does not drink alcohol or use drugs.  Allergies  Allergen Reactions  . Erythromycin Other (See Comments)    Unknown   . Fluviral [Flu Virus Vaccine]   . Penicillins Other (See Comments)    .Has patient had a PCN reaction causing immediate rash, facial/tongue/throat swelling, SOB or lightheadedness with hypotension: Unknown Has patient had a PCN reaction causing severe rash involving mucus membranes or skin necrosis:No Has patient had a PCN reaction that required hospitalization: Yes Has patient had a PCN reaction occurring within the last 10 years: No If all of the above answers are "NO", then may proceed with Cephalosporin use.     Family History  Problem Relation Age of Onset  . Arthritis Unknown   . Asthma Unknown   . Diabetes Unknown   Fam hx reviewed and is noncontributory    Prior to Admission medications   Medication Sig Start Date End Date Taking? Authorizing Provider  albuterol (PROVENTIL) (2.5 MG/3ML) 0.083% nebulizer solution Take 2.5 mg by nebulization every 6 (six) hours as needed for wheezing.   Yes [provider]  aspirin 81 MG tablet Take 81 mg by mouth daily.     Yes [provider]  atorvastatin (LIPITOR) 20 MG tablet Take 20 mg by mouth  daily at 6 PM.   Yes [provider]  dextromethorphan (DELSYM) 30 MG/5ML liquid Take 5 mLs (30 mg total) by mouth 2 (two) times daily as needed for cough. 12/17/17  Yes Johnson, Clanford L, MD  docusate sodium (COLACE) 100 MG capsule Take 100 mg by mouth daily as needed for mild constipation or moderate constipation.    Yes [provider]  donepezil (ARICEPT) 10 MG tablet Take 10 mg by mouth at bedtime.   Yes [provider]  famotidine (PEPCID) 20 MG tablet Take 20 mg by mouth daily.   Yes [provider]  mirtazapine (REMERON) 30 MG tablet Take 30 mg by mouth at bedtime.   Yes [provider]  mometasone-formoterol (DULERA) 200-5 MCG/ACT AERO Inhale 2 puffs into the lungs 2 (two) times daily. 02/06/13  Yes Avon Gully, MD  Multiple Vitamin (MULTIVITAMIN WITH MINERALS) TABS tablet Take 1 tablet by mouth daily.   Yes [provider]  nystatin (MYCOSTATIN) 100000 UNIT/ML suspension Use as directed 5 mLs in the mouth or throat 4 (four) times daily. 7 DAY COURSE TO END ON 12/27/2017   Yes [provider]  oxybutynin (DITROPAN-XL) 5 MG 24 hr tablet Take 5 mg by mouth every evening.   Yes [provider]  potassium chloride SA (K-DUR,KLOR-CON) 20 MEQ tablet Take 2 tablets (40 mEq total) by mouth daily. 12/19/17  Yes Johnson, Clanford L, MD  torsemide (DEMADEX) 20 MG tablet TAKE 40 MG twice daily Patient taking differently: Take 60 mg by mouth 2 (two) times daily.  12/17/17  Yes Johnson, Clanford L, MD  traMADol (ULTRAM) 50 MG tablet Take 1 tablet (50 mg total) by mouth 2 (two) times daily for 3 days. 12/29/2017 12/24/17 Yes Roena Malady, PA-C    Physical Exam: Vitals:   12/12/2017 1800 12/22/2017 1811 12/17/2017 1830 12/04/2017 1943  BP: 116/65  130/82   Pulse: 82 79 83   Resp: (!) 24 19 (!) 21   Temp:      TempSrc:      SpO2: 90% 90% 99%   Weight:    84.8 kg (186 lb 15.2 oz)  Height:    5\' 4"  (1.626 m)    Constitutional: NAD, calm, comfortable Vitals:   12/09/2017 1800 12/15/2017 1811 12/11/2017 1830 12/04/2017 1943  BP: 116/65  130/82   Pulse: 82 79 83   Resp: (!) 24 19 (!) 21   Temp:      TempSrc:      SpO2: 90% 90% 99%   Weight:    84.8 kg (186 lb 15.2 oz)  Height:    5\' 4"  (1.626 m)   Eyes: PERRL, lids and conjunctivae normal EOMI ENMT: Mucous membranes are moist. Posterior pharynx clear of any exudate or lesions.Normal dentition.  Neck: normal, supple, no masses, no thyromegaly Respiratory: clear to auscultation on left,  rhonchi RUL , no wheezing, Normal respiratory effort. No accessory muscle use.  Cardiovascular: Regular rate and rhythm, no  murmurs  1+ pedal pulses. No carotid bruits. 2+ LEE  Abdomen: no tenderness, no masses palpated. No hepatosplenomegaly. Bowel sounds positive. obese Musculoskeletal: no clubbing / cyanosis. No joint deformity upper and lower extremities.  Skin: no obvious ashes, lesions, ulcers. No induration Neurologic: CN 2-12 grossly intact. Sensation intact, . Strength 4/5 in all 4. moves all ext equally  Psychiatric: Normal judgment and insight. Alert and oriented x 3. Normal mood.   Labs on Admission: I have personally reviewed following labs and imaging studies  CBC: Recent Labs  Lab 12/20/17 0700 Jan 07, 2018 1543  WBC 4.7 6.2  NEUTROABS 3.0 5.1  HGB 10.7* 11.3*  HCT 33.9* 34.9*  MCV 92.1 91.1  PLT 86* 104*   Basic Metabolic Panel: Recent Labs  Lab 12/15/17 0426 12/16/17 0459 12/17/17 0549 12/20/17 0700 Jan 07, 2018 1543  NA 145 145 144 143 142  K 4.2 4.6 5.6* 5.0 5.7*  CL 97* 99 97* 96* 96*  CO2 42* 43* 43* 42* 39*  GLUCOSE 107* 105* 95 96 127*  BUN 33* 32* 34* 46* 55*  CREATININE 1.32* 1.26* 1.31* 1.64* 1.73*  CALCIUM 8.5* 8.5* 8.5* 8.8* 9.0  MG 2.4  --   --   --   --    GFR: Estimated Creatinine Clearance: 24.6 mL/min (A) (by C-G formula based on SCr of 1.73 mg/dL (H)). Liver Function Tests: Recent Labs  Lab Jan 07, 2018 1543  AST 40  ALT 30  ALKPHOS 84  BILITOT 0.8  PROT 6.9  ALBUMIN 3.2*   No results for input(s): LIPASE, AMYLASE in the last 168 hours. No results for input(s): AMMONIA in the last 168 hours. Coagulation Profile: No results for input(s): INR, PROTIME in the last 168 hours. Cardiac Enzymes: Recent Labs  Lab Jan 07, 2018 1919  TROPONINI 0.27*   BNP (last 3 results) No results for input(s): PROBNP in the last 8760 hours. HbA1C: No results for input(s): HGBA1C in the last 72 hours. CBG: Recent Labs  Lab 12/16/17 0832 12/16/17 1100  GLUCAP 110* 108*   Lipid Profile: No results for input(s): CHOL, HDL, LDLCALC, TRIG, CHOLHDL, LDLDIRECT in the last 72  hours. Thyroid Function Tests: No results for input(s): TSH, T4TOTAL, FREET4, T3FREE, THYROIDAB in the last 72 hours. Anemia Panel: No results for input(s): VITAMINB12, FOLATE, FERRITIN, TIBC, IRON, RETICCTPCT in the last 72 hours. Urine analysis:    Component Value Date/Time   COLORURINE YELLOW 02/28/2013 0656   APPEARANCEUR CLEAR 02/28/2013 0656   LABSPEC 1.008 02/28/2013 0656   PHURINE 5.0 02/28/2013 0656   GLUCOSEU NEGATIVE 02/28/2013 0656   HGBUR MODERATE (A) 02/28/2013 0656   BILIRUBINUR NEGATIVE 02/28/2013 0656   KETONESUR NEGATIVE 02/28/2013 0656   PROTEINUR 30 (A) 02/28/2013 0656   UROBILINOGEN 1.0 02/28/2013 0656   NITRITE NEGATIVE 02/28/2013 0656   LEUKOCYTESUR SMALL (A) 02/28/2013 0656    Radiological Exams on Admission: Dg Chest Port 1 View  Result Date: 2018/05/17 CLINICAL DATA:  Progressive shortness of breath. EXAM: PORTABLE CHEST 1 VIEW COMPARISON:  One-view chest x-ray 12/15/2017. FINDINGS: The heart is enlarged. New asymmetric right upper lobe airspace disease is present. Bilateral pleural effusions and bibasilar airspace disease are relatively similar to the prior exam. Atherosclerotic changes are again noted at the aortic arch. IMPRESSION: 1. New right upper lobe pneumonia. 2. Similar appearance of bilateral pleural effusions and basilar airspace disease, likely atelectasis. There is underlying congestive heart failure. Electronically Signed   By: Marin Robertshristopher  Mattern M.D.   On: 02019/12/17 16:18    EKG: Independently reviewed. SR,old RBBB  Assessment/Plan Principal Problem:   Hospital acquired PNA Active Problems:   Acute on chronic diastolic (congestive) heart failure (HCC)   Acute on chronic respiratory failure with hypoxia (HCC)   Elevated troponin   Hyperkalemia   CKD (chronic kidney disease) stage 3, GFR 30-59 ml/min (HCC)   Essential hypertension   Dementia   BRONCHITIS, CHRONIC NOS   CHF (congestive heart failure), NYHA class I, acute on chronic,  diastolic (HCC)   Dementia due to Parkinson's disease without behavioral disturbance (HCC)    -  Inpatient admission to telemetry IV antibiotics per protocol -Wean oxygen to chronic level as tolerated -Admit to telemetry, IV Lasix, follow ins and outs and daily weights, restrict fluids.  Cycle cardiac enzymes.  Supplemental oxygen to maintain oxygen sats as needed, Cont Asa.  -Received Iv lasix in ED> no acute EKG changes. Follow K in am  --CKD stable  -Cont home meds for htn , Dementia, chronic bronchtis     DVT prophylaxis: lovenox  Code Status: Full  Disposition Plan: suspect d/c back  to [facility] living [facility]  Admission status: inpt tele   Synetta Fail MD [facility] Pager [phone]  If 7PM-7AM, please contact night-coverage [email or web address] Password [facility]  12/11/2017, 8:38 PM

## 2017-12-22 ENCOUNTER — Inpatient Hospital Stay (HOSPITAL_COMMUNITY): Payer: Medicare Other | Admitting: Anesthesiology

## 2017-12-22 ENCOUNTER — Inpatient Hospital Stay (HOSPITAL_COMMUNITY): Payer: Medicare Other

## 2017-12-22 DIAGNOSIS — N179 Acute kidney failure, unspecified: Secondary | ICD-10-CM

## 2017-12-22 DIAGNOSIS — R112 Nausea with vomiting, unspecified: Secondary | ICD-10-CM

## 2017-12-22 DIAGNOSIS — J690 Pneumonitis due to inhalation of food and vomit: Secondary | ICD-10-CM

## 2017-12-22 DIAGNOSIS — G9340 Encephalopathy, unspecified: Secondary | ICD-10-CM

## 2017-12-22 DIAGNOSIS — K920 Hematemesis: Secondary | ICD-10-CM

## 2017-12-22 DIAGNOSIS — I10 Essential (primary) hypertension: Secondary | ICD-10-CM

## 2017-12-22 DIAGNOSIS — R579 Shock, unspecified: Secondary | ICD-10-CM

## 2017-12-22 DIAGNOSIS — E875 Hyperkalemia: Secondary | ICD-10-CM

## 2017-12-22 DIAGNOSIS — Z01818 Encounter for other preprocedural examination: Secondary | ICD-10-CM

## 2017-12-22 DIAGNOSIS — R042 Hemoptysis: Secondary | ICD-10-CM

## 2017-12-22 DIAGNOSIS — J9621 Acute and chronic respiratory failure with hypoxia: Secondary | ICD-10-CM

## 2017-12-22 DIAGNOSIS — J9601 Acute respiratory failure with hypoxia: Secondary | ICD-10-CM

## 2017-12-22 DIAGNOSIS — N183 Chronic kidney disease, stage 3 (moderate): Secondary | ICD-10-CM

## 2017-12-22 DIAGNOSIS — R748 Abnormal levels of other serum enzymes: Secondary | ICD-10-CM

## 2017-12-22 DIAGNOSIS — F0390 Unspecified dementia without behavioral disturbance: Secondary | ICD-10-CM

## 2017-12-22 LAB — POCT I-STAT 3, ART BLOOD GAS (G3+)
Acid-Base Excess: 15 mmol/L — ABNORMAL HIGH (ref 0.0–2.0)
Bicarbonate: 40.4 mmol/L — ABNORMAL HIGH (ref 20.0–28.0)
O2 Saturation: 100 %
TCO2: 42 mmol/L — ABNORMAL HIGH (ref 22–32)
pCO2 arterial: 51 mmHg — ABNORMAL HIGH (ref 32.0–48.0)
pH, Arterial: 7.506 — ABNORMAL HIGH (ref 7.350–7.450)
pO2, Arterial: 219 mmHg — ABNORMAL HIGH (ref 83.0–108.0)

## 2017-12-22 LAB — CBC
HCT: 28.2 % — ABNORMAL LOW (ref 36.0–46.0)
HEMATOCRIT: 32.4 % — AB (ref 36.0–46.0)
HEMOGLOBIN: 10.4 g/dL — AB (ref 12.0–15.0)
Hemoglobin: 9.8 g/dL — ABNORMAL LOW (ref 12.0–15.0)
MCH: 29 pg (ref 26.0–34.0)
MCH: 30 pg (ref 26.0–34.0)
MCHC: 32.1 g/dL (ref 30.0–36.0)
MCHC: 34.8 g/dL (ref 30.0–36.0)
MCV: 90.3 fL (ref 78.0–100.0)
MCV: 86.2 fL (ref 78.0–100.0)
PLATELETS: 100 10*3/uL — AB (ref 150–400)
Platelets: 92 10*3/uL — ABNORMAL LOW (ref 150–400)
RBC: 3.59 MIL/uL — ABNORMAL LOW (ref 3.87–5.11)
RBC: 3.27 MIL/uL — AB (ref 3.87–5.11)
RDW: 14.3 % (ref 11.5–15.5)
RDW: 13.7 % (ref 11.5–15.5)
WBC: 6.7 10*3/uL (ref 4.0–10.5)
WBC: 11 10*3/uL — AB (ref 4.0–10.5)

## 2017-12-22 LAB — BASIC METABOLIC PANEL
ANION GAP: 6 (ref 5–15)
BUN: 56 mg/dL — ABNORMAL HIGH (ref 8–23)
CHLORIDE: 98 mmol/L (ref 98–111)
CO2: 41 mmol/L — AB (ref 22–32)
CREATININE: 1.68 mg/dL — AB (ref 0.44–1.00)
Calcium: 8.7 mg/dL — ABNORMAL LOW (ref 8.9–10.3)
GFR calc non Af Amer: 26 mL/min — ABNORMAL LOW (ref >60)
GFR, EST AFRICAN AMERICAN: 31 mL/min — AB (ref >60)
Glucose, Bld: 118 mg/dL — ABNORMAL HIGH (ref 70–99)
Potassium: 5.1 mmol/L (ref 3.5–5.1)
Sodium: 145 mmol/L (ref 135–145)

## 2017-12-22 LAB — GLUCOSE, CAPILLARY
GLUCOSE-CAPILLARY: 95 mg/dL (ref 70–99)
Glucose-Capillary: 84 mg/dL (ref 70–99)
Glucose-Capillary: 81 mg/dL (ref 70–99)

## 2017-12-22 LAB — BLOOD GAS, ARTERIAL
ACID-BASE EXCESS: 11 mmol/L — AB (ref 0.0–2.0)
Bicarbonate: 32.9 mmol/L — ABNORMAL HIGH (ref 20.0–28.0)
DRAWN BY: 27733
FIO2: 1
MECHVT: 420 mL
O2 SAT: 71.4 %
PEEP/CPAP: 5 cmH2O
PH ART: 7.316 — AB (ref 7.350–7.450)
PO2 ART: 42 mmHg — AB (ref 83.0–108.0)
Patient temperature: 37
RATE: 15 resp/min
pCO2 arterial: 75.4 mmHg (ref 32.0–48.0)

## 2017-12-22 LAB — PROCALCITONIN: Procalcitonin: <0.1 ng/mL

## 2017-12-22 LAB — TROPONIN I
Troponin I: 0.29 ng/mL (ref <0.03)
Troponin I: 0.35 ng/mL (ref <0.03)

## 2017-12-22 LAB — PROTIME-INR
INR: 1.3
PROTHROMBIN TIME: 16.1 s — AB (ref 11.4–15.2)

## 2017-12-22 LAB — APTT: aPTT: 28 seconds (ref 24–36)

## 2017-12-22 MED ORDER — SODIUM CHLORIDE 0.9 % IV SOLN
8.0000 mg/h | INTRAVENOUS | Status: DC
  Administered 2017-12-22 – 2017-12-24 (×4): 8 mg/h via INTRAVENOUS
  Filled 2017-12-22 (×6): qty 80

## 2017-12-22 MED ORDER — MIDAZOLAM HCL 2 MG/2ML IJ SOLN
1.0000 mg | INTRAMUSCULAR | Status: DC | PRN
  Administered 2017-12-23: 1 mg via INTRAVENOUS

## 2017-12-22 MED ORDER — FENTANYL CITRATE (PF) 100 MCG/2ML IJ SOLN
100.0000 ug | Freq: Once | INTRAMUSCULAR | Status: AC
  Filled 2017-12-22: qty 2

## 2017-12-22 MED ORDER — PANTOPRAZOLE SODIUM 40 MG PO TBEC: 40.0000 mg | DELAYED_RELEASE_TABLET | Freq: Every day | ORAL | Status: DC

## 2017-12-22 MED ORDER — ETOMIDATE 2 MG/ML IV SOLN
20.0000 mg | Freq: Once | INTRAVENOUS | Status: AC
  Administered 2017-12-22: 10 mg via INTRAVENOUS
  Filled 2017-12-22: qty 10

## 2017-12-22 MED ORDER — MIDAZOLAM HCL 2 MG/2ML IJ SOLN
1.0000 mg | INTRAMUSCULAR | Status: DC | PRN
  Filled 2017-12-22 (×2): qty 2

## 2017-12-22 MED ORDER — FENTANYL CITRATE (PF) 100 MCG/2ML IJ SOLN
50.0000 ug | INTRAMUSCULAR | Status: AC | PRN
  Administered 2017-12-22 (×3): 50 ug via INTRAVENOUS
  Filled 2017-12-22 (×3): qty 2

## 2017-12-22 MED ORDER — ROCURONIUM BROMIDE 50 MG/5ML IV SOLN
50.0000 mg | Freq: Once | INTRAVENOUS | Status: AC
  Administered 2017-12-22: 50 mg via INTRAVENOUS
  Filled 2017-12-22: qty 5

## 2017-12-22 MED ORDER — MIDAZOLAM HCL 2 MG/2ML IJ SOLN: 1.0000 mg | INTRAMUSCULAR | Status: DC | PRN

## 2017-12-22 MED ORDER — BUDESONIDE 0.5 MG/2ML IN SUSP
0.5000 mg | Freq: Two times a day (BID) | RESPIRATORY_TRACT | Status: DC
  Administered 2017-12-22 – 2018-01-02 (×23): 0.5 mg via RESPIRATORY_TRACT
  Filled 2017-12-22 (×24): qty 2

## 2017-12-22 MED ORDER — SODIUM CHLORIDE 0.9 % IV SOLN
80.0000 mg | Freq: Once | INTRAVENOUS | Status: AC
  Administered 2017-12-22: 80 mg via INTRAVENOUS
  Filled 2017-12-22 (×2): qty 80

## 2017-12-22 MED ORDER — CHLORHEXIDINE GLUCONATE 0.12% ORAL RINSE (MEDLINE KIT)
15.0000 mL | Freq: Two times a day (BID) | OROMUCOSAL | Status: DC
  Administered 2017-12-22 – 2017-12-29 (×15): 15 mL via OROMUCOSAL

## 2017-12-22 MED ORDER — METHYLPREDNISOLONE SODIUM SUCC 125 MG IJ SOLR
60.0000 mg | Freq: Three times a day (TID) | INTRAMUSCULAR | Status: DC
  Administered 2017-12-22: 60 mg via INTRAVENOUS
  Filled 2017-12-22: qty 2

## 2017-12-22 MED ORDER — FENTANYL CITRATE (PF) 100 MCG/2ML IJ SOLN
100.0000 ug | Freq: Once | INTRAMUSCULAR | Status: AC
  Administered 2017-12-22: 100 ug via INTRAVENOUS

## 2017-12-22 MED ORDER — IPRATROPIUM-ALBUTEROL 0.5-2.5 (3) MG/3ML IN SOLN
3.0000 mL | Freq: Four times a day (QID) | RESPIRATORY_TRACT | Status: DC
  Administered 2017-12-22 – 2018-01-03 (×47): 3 mL via RESPIRATORY_TRACT
  Filled 2017-12-22 (×46): qty 3

## 2017-12-22 MED ORDER — FUROSEMIDE 10 MG/ML IJ SOLN
40.0000 mg | Freq: Every day | INTRAMUSCULAR | Status: DC
  Administered 2017-12-23: 40 mg via INTRAVENOUS
  Filled 2017-12-22: qty 4

## 2017-12-22 MED ORDER — ORAL CARE MOUTH RINSE
15.0000 mL | OROMUCOSAL | Status: DC
  Administered 2017-12-22 – 2017-12-29 (×68): 15 mL via OROMUCOSAL

## 2017-12-22 MED ORDER — MIDAZOLAM HCL 2 MG/2ML IJ SOLN: 2.0000 mg | Freq: Once | INTRAMUSCULAR | Status: AC

## 2017-12-22 MED ORDER — MIDAZOLAM HCL 2 MG/2ML IJ SOLN
1.0000 mg | INTRAMUSCULAR | Status: DC | PRN
  Administered 2017-12-22 (×2): 1 mg via INTRAVENOUS
  Filled 2017-12-22 (×3): qty 2

## 2017-12-22 MED ORDER — FENTANYL CITRATE (PF) 100 MCG/2ML IJ SOLN: 50.0000 ug | INTRAMUSCULAR | Status: DC | PRN

## 2017-12-22 MED ORDER — FAMOTIDINE IN NACL 20-0.9 MG/50ML-% IV SOLN
20.0000 mg | Freq: Every day | INTRAVENOUS | Status: DC
  Administered 2017-12-22: 20 mg via INTRAVENOUS
  Filled 2017-12-22: qty 50

## 2017-12-22 MED ORDER — PREDNISONE 20 MG PO TABS: 20.0000 mg | ORAL_TABLET | Freq: Every day | ORAL | Status: DC

## 2017-12-22 MED ORDER — PANTOPRAZOLE SODIUM 40 MG IV SOLR
40.0000 mg | Freq: Two times a day (BID) | INTRAVENOUS | Status: DC
  Administered 2017-12-26: 40 mg via INTRAVENOUS
  Filled 2017-12-22: qty 40

## 2017-12-22 MED ORDER — FENTANYL CITRATE (PF) 100 MCG/2ML IJ SOLN
50.0000 ug | INTRAMUSCULAR | Status: DC | PRN
  Filled 2017-12-22: qty 2

## 2017-12-22 MED ORDER — INSULIN ASPART 100 UNIT/ML ~~LOC~~ SOLN
1.0000 [IU] | SUBCUTANEOUS | Status: DC
  Administered 2017-12-25 – 2018-01-01 (×9): 1 [IU] via SUBCUTANEOUS

## 2017-12-22 MED ORDER — FUROSEMIDE 10 MG/ML IJ SOLN: 40.0000 mg | Freq: Two times a day (BID) | INTRAMUSCULAR | Status: DC

## 2017-12-22 MED ORDER — FAMOTIDINE IN NACL 20-0.9 MG/50ML-% IV SOLN
20.0000 mg | Freq: Two times a day (BID) | INTRAVENOUS | Status: DC
  Filled 2017-12-22: qty 50

## 2017-12-22 MED ORDER — MIDAZOLAM HCL 2 MG/2ML IJ SOLN
2.0000 mg | Freq: Once | INTRAMUSCULAR | Status: AC
  Administered 2017-12-22: 2 mg via INTRAVENOUS

## 2017-12-22 NOTE — Progress Notes (Signed)
CRITICAL VALUE ALERT  Critical Value:  Troponin 0.35  Date & Time Notied:  12/22/17  1806  Provider Notified: Molli KnockYacoub  Orders Received/Actions taken: no new orders

## 2017-12-22 NOTE — Clinical Social Work Note (Signed)
Patient was placed in [facility] last week for short term rehab.  Plan is to return to [facility] at discharged to complete rehab.   See full assessment below completed on 12/14/17.   Clinical Social Work Assessment  Patient Details  Name: Brittney Li MRN: [id number] Date of Birth: 15-Nov-1929  Date of referral:  12/14/17               Reason for consult:  Discharge Planning, Facility Placement                    Permission sought to share information with:  Chartered certified accountant granted to share information::  Yes, Verbal Permission Granted             Name::                   Agency::  [facility]             Relationship::                Contact Information:     Housing/Transportation Living arrangements for the past 2 months:  Apartment Source of Information:  Patient Patient Interpreter Needed:  None Criminal Activity/Legal Involvement Pertinent to Current Situation/Hospitalization:  No - Comment as needed Significant Relationships:  Adult Children, Other Family Members Lives with:  Self Do you feel safe going back to the place where you live?  Yes Need for family participation in patient care:  No (Coment)  Care giving concerns: PT recommending SNF short term rehab.   Social Worker assessment / plan: Pt is an 84 year old female referred to [facility] for SNF placement. Met with pt this afternoon to assess. Pt lives alone in an apartment here in [place]. Pt states she has been at [facility] in the past for short term rehab and she is agreeable to a referral there again. Will start referral and follow for dc planning needs.  Employment status:  Retired Forensic scientist:  Information systems manager, Medicaid In [place] PT Recommendations:  [place] / Referral to community resources:  [place]  Patient/Family's Response to care: Pt accepting of care.  Patient/Family's Understanding of and Emotional Response to Diagnosis, Current Treatment,  and Prognosis: Pt appears to have a good understanding of diagnosis and treatment recommendations. No emotional distress identified.  Emotional Assessment Appearance:  Appears stated age Attitude/Demeanor/Rapport:  Engaged Affect (typically observed):  Calm, Pleasant Orientation:  Oriented to Self, Oriented to Place, Oriented to  Time, Oriented to Situation Alcohol / Substance use:  Not Applicable Psych involvement (Current and /or in the community):  No (Comment)  Discharge Needs  Concerns to be addressed:  Discharge Planning Concerns Readmission within the last 30 days:  No Current discharge risk:  Physical Impairment Barriers to Discharge:  No Barriers Identified   Shade Flood, LCSW 12/14/2017, 1:14 PM

## 2017-12-22 NOTE — Progress Notes (Signed)
PROGRESS NOTE    Brittney Li  GYJ:[id number] DOB: [birth year] DOA: 12/24/2017 PCP: Rosita Fire, MD    Brief Narrative:  84 y.o. female with medical history significant of CHF and dementia chronic respiratory failure presents with shortness of breath.  Patient currently resides at a nursing  facility.  She  had increasing shortness of breath and cough over the past few days.  Denies any fever but did feel warm today.  Does have some dementia so was not very extensive with her history.  Was found to have an increasing oxygen demand here in the ED.  Admits that she only uses 3 L chronically and needed 4-5 L here to be near 90% at rest. Patient was found to have changes suggestive of a right upper lobe pneumonia and CHF on admission. Patient discharged on 12/17/17 after been admitted with acute on chronic diastolic HF at that time. She also reported seen some streak of blood since Monday (12/20/17) after coughing spells.   Assessment & Plan: 1-acute on chronic resp failure: with hypoxia and requiring intubation and mechanical ventilatory support. -multi-factorial in origin: including Hospital acquired PNA, COPD exacerbation, Hemoptysis and acute on chronic diastolic HF. -due to resp arrest, patient intubated and mechanically ventilated -case discussed with Dr. Nelda Marseille and patient accepted to [facility] for further evaluation and management. -she will need bronchoscopy most likely. -will continue nebulization, IV steroids and broad spectrum antibiotics. -continue also IV lasix (dose adjusted, given increase in Cr and soft BP). -continue ventilatory support and follow critical care and pulmonology rec's. -checking ABG -stop heparin products; SCD's for DVT prophylaxis   2-Dementia -Mild -patient at home on Aricept -on hold for now  3-Essential hypertension -BP stable to soft currently -will continue lasix only at adjusted dose.  -follow VS  4-COPD with chronic bronchitis and acute exacerbation    -as mentioned above will continue IV steroids and nebulizer treatment -receiving antibiotics for HCAP as well.  5-Acute on chronic diastolic (congestive) heart failure (HCC) -follow daily weights -continue IV lasix  -strict I's and O's -patient denies CP -mild elevation in her troponin; flat elevation and most likely suggesting demand ischemia in the setting of acute CHF. -monitor on telemetry   6-Hyperkalemia -resolved with IV diuresis -follow electrolyte trend   7- Acute on chronic kidney disease; stage 3 at baseline, GFR 30-59 ml/min (HCC) -in the setting of diuresis and also CHF -lasix dose adjusted -follow renal function trend  -medications adjusted by pharmacy for her renal function   8-GERD/GI prophylaxis  -will continue famotidine IV BID   DVT prophylaxis: SCD's Code Status: Full Code Family Communication: none at bedside  Disposition Plan: patient intubated, mechanically ventilated and transfer to [facility] for further evaluation and management; she will need bronchoscopy. Continue IV antibiotics, nebulizer treatment, broad spectrum antibiotics and diuresis for volume control.   Consultants:   Critical Care and pulmonology   Procedures:   See below for x-ray reports   Intubated and mechanically ventilated 12/22/17  Antimicrobials:  Anti-infectives (From admission, onward)   Start     Dose/Rate Route Frequency Ordered Stop   12/22/17 1800  vancomycin (VANCOCIN) IVPB 1000 mg/200 mL premix     1,000 mg 200 mL/hr over 60 Minutes Intravenous Every 24 hours 12/20/2017 1654     12/22/17 0100  aztreonam (AZACTAM) 1 g in sodium chloride 0.9 % 100 mL IVPB     1 g 200 mL/hr over 30 Minutes Intravenous Every 8 hours 12/05/2017 2033 12/30/17 0059   12/12/2017  1730  vancomycin (VANCOCIN) 1,750 mg in sodium chloride 0.9 % 500 mL IVPB     1,750 mg 250 mL/hr over 120 Minutes Intravenous  Once 12/28/2017 1649 12/18/2017 1920   11/30/2017 1645  aztreonam (AZACTAM) 2 g in sodium chloride 0.9  % 100 mL IVPB     2 g 200 mL/hr over 30 Minutes Intravenous  Once 12/17/2017 1631 12/20/2017 1726       Subjective: Afebrile, increase SOB and hemoptysis appreciated. Patient using 4-5L [place] supplementation.   Objective: Vitals:   12/17/2017 2243 12/22/17 0618 12/22/17 0738 12/22/17 1201  BP: (!) 103/55 (!) 106/52    Pulse: 91 65    Resp: 20 12    Temp: 99.3 F (37.4 C) 98.5 F (36.9 C)  (!) 97.2 F (36.2 C)  TempSrc: Oral Oral  Oral  SpO2: 94% 99% 98%   Weight:  83.4 kg (183 lb 13.8 oz)  86.6 kg (190 lb 14.7 oz)  Height:    5' 4"  (1.626 m)    Intake/Output Summary (Last 24 hours) at 12/22/2017 1301 Last data filed at 12/22/2017 0800 Gross per 24 hour  Intake 323 ml  Output -  Net 323 ml   Filed Weights   12/06/2017 1943 12/22/17 0618 12/22/17 1201  Weight: 84.8 kg (186 lb 15.2 oz) 83.4 kg (183 lb 13.8 oz) 86.6 kg (190 lb 14.7 oz)    Examination: General exam: Patient was Alert, awake, oriented x 3; having increased difficulty breathing, using accessory muscles and unable to speak in full sentences. Patient ended developing acute respiratory arrest, became tachypneic and had O2 sat down into mid 50's. She was intubated. Copious amount of Hemoptysis was appreciated. Respiratory system: diffuse crackles, exp wheezing and decrease BS at bases appreciated. Cardiovascular system: RRR. No murmurs, rubs or gallops. Mild JVD appreciated on exam. Gastrointestinal system: Abdomen is nondistended, soft and nontender. No organomegaly or masses felt. Normal bowel sounds heard. Central nervous system: CN grossly intact. No focal neurological deficits.  Extremities: No Cyanosis, no clubbing. Patient with 2-3++ edema bilaterally on exam. TED hose in place. Skin: No open wounds, rashes, petechiae, lesions or ulcers.   Data Reviewed: I have personally reviewed following labs and imaging studies  CBC: Recent Labs  Lab 12/20/17 0700 12/04/2017 1543 12/22/17 1206  WBC 4.7 6.2 6.7  NEUTROABS 3.0  5.1  --   HGB 10.7* 11.3* 10.4*  HCT 33.9* 34.9* 32.4*  MCV 92.1 91.1 90.3  PLT 86* 104* 92*   Basic Metabolic Panel: Recent Labs  Lab 12/16/17 0459 12/17/17 0549 12/20/17 0700 12/23/2017 1543 12/22/17 0510  NA 145 144 143 142 145  K 4.6 5.6* 5.0 5.7* 5.1  CL 99 97* 96* 96* 98  CO2 43* 43* 42* 39* 41*  GLUCOSE 105* 95 96 127* 118*  BUN 32* 34* 46* 55* 56*  CREATININE 1.26* 1.31* 1.64* 1.73* 1.68*  CALCIUM 8.5* 8.5* 8.8* 9.0 8.7*   GFR: Estimated Creatinine Clearance: 25.6 mL/min (A) (by C-G formula based on SCr of 1.68 mg/dL (H)).   Liver Function Tests: Recent Labs  Lab 12/06/2017 1543  AST 40  ALT 30  ALKPHOS 84  BILITOT 0.8  PROT 6.9  ALBUMIN 3.2*   Cardiac Enzymes: Recent Labs  Lab 12/07/2017 1919 12/22/17 0045  TROPONINI 0.27* 0.29*   CBG: Recent Labs  Lab 12/16/17 0832 12/16/17 1100  GLUCAP 110* 108*   Urine analysis:    Component Value Date/Time   COLORURINE YELLOW 02/28/2013 [place] 02/28/2013  0656   LABSPEC 1.008 02/28/2013 0656   PHURINE 5.0 02/28/2013 0656   GLUCOSEU NEGATIVE 02/28/2013 0656   HGBUR MODERATE (A) 02/28/2013 0656   BILIRUBINUR NEGATIVE 02/28/2013 0656   KETONESUR NEGATIVE 02/28/2013 0656   PROTEINUR 30 (A) 02/28/2013 0656   UROBILINOGEN 1.0 02/28/2013 0656   NITRITE NEGATIVE 02/28/2013 0656   LEUKOCYTESUR SMALL (A) 02/28/2013 0656    Recent Results (from the past 240 hour(s))  Culture, blood (routine x 2)     Status: None (Preliminary result)   Collection Time: 12/14/2017  3:30 PM  Result Value Ref Range Status   Specimen Description BLOOD LEFT FOREARM  Final   Special Requests   Final    BOTTLES DRAWN AEROBIC AND ANAEROBIC Blood Culture adequate volume   Culture   Final    NO GROWTH < 24 HOURS Performed at [facility], [address] [place], [place] [address]    Report Status PENDING  Incomplete  Culture, blood (routine x 2)     Status: None (Preliminary result)   Collection Time: 12/20/2017  4:49 PM   Result Value Ref Range Status   Specimen Description BLOOD LEFT HAND  Final   Special Requests   Final    BOTTLES DRAWN AEROBIC AND ANAEROBIC Blood Culture adequate volume   Culture   Final    NO GROWTH < 24 HOURS Performed at [facility], [address] [place], [place] [address]    Report Status PENDING  Incomplete  MRSA PCR Screening     Status: None   Collection Time: 12/11/2017  7:38 PM  Result Value Ref Range Status   MRSA by PCR NEGATIVE NEGATIVE Final    Comment:        The GeneXpert MRSA Assay (FDA approved for NASAL specimens only), is one component of a comprehensive MRSA colonization surveillance program. It is not intended to diagnose MRSA infection nor to guide or monitor treatment for MRSA infections. Performed at [facility], [address], [place], [place] [address]      Radiology Studies: Dg Chest [facility] 1 View  Result Date: 12/22/2017 CLINICAL DATA:  Intubation EXAM: PORTABLE CHEST 1 VIEW COMPARISON:  Yesterday FINDINGS: Under penetrated study due to patient size and portable technique. New endotracheal tube with tip between the clavicular heads and carina. An orogastric tube reaches the stomach. Cardiomegaly with vascular congestion and probable layering pleural fluid. Increased airspace opacity in the right upper lobe. IMPRESSION: 1. New endotracheal and orogastric tubes in good position. 2. CHF pattern. 3. Increased airspace opacity in the right upper lobe, asymmetric edema versus superimposed pneumonia Electronically Signed   By: Monte Fantasia M.D.   On: 12/22/2017 12:23   Dg Chest Port 1 View  Result Date: 12/18/2017 CLINICAL DATA:  Progressive shortness of breath. EXAM: PORTABLE CHEST 1 VIEW COMPARISON:  One-view chest x-ray 12/15/2017. FINDINGS: The heart is enlarged. New asymmetric right upper lobe airspace disease is present. Bilateral pleural effusions and bibasilar airspace disease are relatively similar to the prior exam. Atherosclerotic changes are  again noted at the aortic arch. IMPRESSION: 1. New right upper lobe pneumonia. 2. Similar appearance of bilateral pleural effusions and basilar airspace disease, likely atelectasis. There is underlying congestive heart failure. Electronically Signed   By: San Morelle M.D.   On: 12/18/2017 16:18    Scheduled Meds: . budesonide (PULMICORT) nebulizer solution  0.5 mg Nebulization BID  . chlorhexidine gluconate (MEDLINE KIT)  15 mL Mouth Rinse BID  . donepezil  10 mg Oral QHS  .  furosemide  40 mg Intravenous Q12H  . mouth rinse  15 mL Mouth Rinse BID  . mouth rinse  15 mL Mouth Rinse 10 times per day  . methylPREDNISolone sodium succinate  60 mg Intravenous Q8H  . mirtazapine  30 mg Oral QHS  . multivitamin with minerals  1 tablet Oral Daily  . nystatin  5 mL Mouth/Throat QID  . oxybutynin  5 mg Oral QPM  . sodium chloride flush  3 mL Intravenous Q12H   Continuous Infusions: . sodium chloride    . albuterol Stopped (12/09/2017 1651)  . aztreonam Stopped (12/22/17 1035)  . famotidine (PEPCID) IV    . vancomycin       LOS: 1 day    The patient is critically ill with multiple organ systems failure and requires high complexity decision making for assessment and support, frequent evaluation and titration of therapies, application of advanced monitoring technologies and extensive interpretation of multiple databases.   Critical care time - 85 mins.     Barton Dubois, MD [facility] Pager [phone]  If 7PM-7AM, please contact night-coverage [email or web address] Password [email or web address] 12/22/2017, 1:01 PM

## 2017-12-22 NOTE — Anesthesia Procedure Notes (Deleted)
Performed by: Itzayanna Kaster M, MD       

## 2017-12-22 NOTE — Progress Notes (Signed)
Rapid Response Event Note  Overview:  Code blue called at 1120 to room 319. Pt stopped to talking and became unresponsive Gean QuintAmy Hobbs, RN and NT were in room with pt.     Initial Focused Assessment: pt not responsive, laying in bed, with copious bright red bloody secretions. Sinus rhythm HR in the 70's. Saturating at 54% with respiratory manually ventilating.    Interventions: Intubation   Plan of Care (if not transferred): transferred to ICU 11  Event Summary:   at      at          Bing PlumeElisabeth L Felder Lebeda

## 2017-12-22 NOTE — Procedures (Signed)
Bronchoscopy Procedure Note Brittney HawsMary A Li [id number] 01/24/1930  Procedure: Bronchoscopy Indications: Diagnostic evaluation of the airways  Procedure Details Consent: Risks of procedure as well as the alternatives and risks of each were explained to the (patient/caregiver).  Consent for procedure obtained. Time Out: Verified patient identification, verified procedure, site/side was marked, verified correct patient position, special equipment/implants available, medications/allergies/relevent history reviewed, required imaging and test results available.  Performed  In preparation for procedure, patient was given 100% FiO2, bronchoscope lubricated and lidocaine given via ETT (5 ml). Sedation: Benzodiazepines, Muscle relaxants, Etomidate and Fentanyl  Airway entered and the following bronchi were examined: RUL, RML, RLL, LUL, LLL and Bronchi.   Minimal airway in the left side.  Right sided with blood primarily in the lower lobes that was cleared and washed then no further blood noted. Bronchoscope removed.  , Patient placed back on 100% FiO2 at conclusion of procedure.    Evaluation Hemodynamic Status: Transient hypotension treated with fluid; O2 sats: stable throughout Patient's Current Condition: stable Specimens:  Sent serosanguinous fluid Complications: No apparent complications Patient did tolerate procedure well.  Sample sent for bacterial, fungal and AFB stains and cultures with cytology.  Brittney Li,Brittney Li 12/22/2017

## 2017-12-22 NOTE — Plan of Care (Signed)
  Problem: Acute Rehab PT Goals(only PT should resolve) Goal: Pt Will Go Supine/Side To Sit Outcome: Progressing Flowsheets (Taken 12/22/2017 0903) Pt will go Supine/Side to Sit: with supervision Goal: Patient Will Transfer Sit To/From Stand Outcome: Progressing Flowsheets (Taken 12/22/2017 0903) Patient will transfer sit to/from stand: with min guard assist Goal: Pt Will Transfer Bed To Chair/Chair To Bed Outcome: Progressing Flowsheets (Taken 12/22/2017 0903) Pt will Transfer Bed to Chair/Chair to Bed: min guard assist Goal: Pt Will Ambulate Outcome: Progressing Flowsheets (Taken 12/22/2017 0903) Pt will Ambulate: 25 feet;with minimal assist;with rolling walker   9:03 AM, 12/22/17 Ocie BobJames Marshelle Bilger, MPT Physical Therapist with [facility] [phone] office [phone] mobile phone

## 2017-12-22 NOTE — H&P (Addendum)
PULMONARY / CRITICAL CARE MEDICINE   Name: Brittney Li MRN: [id number] DOB: 1929/10/06    ADMISSION DATE:  12/26/2017 CONSULTATION DATE:  12/22/17  REFERRING MD:  Dr. Dyann Li [facility])  CHIEF COMPLAINT:  Acute Hypoxic Respiratory Failure, Hemoptysis  HISTORY OF PRESENT ILLNESS:   Brittney Li is a 84 y.o. Female with a PMH of chronic respiratory failure requiring 3L home H8,NIDP,OEUMPN, Diastolic CHF, Dementia who presented to [facility] ED on 7/23 with c/o progressive Shortness of breath and cough for several days.  Initial workup at [facility] was concerning for CHF exacerbation and RUL PNA.  Pt was recently treated for CHF and discharged to nursing facility on 12/17/17. On 7/24 while at [facility], pt with hypoxia and suffered respiratory arrest in the setting of hematemesis vs hemoptysis, necessitating intubation.   Pt transferred to [facility] later in afternoon on 12/22/17 for further management of Acute on Chronic Hypoxic Respiratory failure requiring intubation and Hemoptysis. PCCM is asked to admit the pt.  PAST MEDICAL HISTORY :  She  has a past medical history of Arthritis, Asthma, Bronchitis, CHF (congestive heart failure) ([place]), COPD (chronic obstructive pulmonary disease) ([place]), Dementia, Diabetes mellitus without complication ([place]), Hyperkalemia, Hypertension, Renal disorder, and Respiratory failure ([place]).  PAST SURGICAL HISTORY: She  has a past surgical history that includes hands and Abdominal hysterectomy (1975).  Allergies  Allergen Reactions  . Erythromycin Other (See Comments)    Unknown   . Fluviral [Flu Virus Vaccine]   . Penicillins Other (See Comments)    .Has patient had a PCN reaction causing immediate rash, facial/tongue/throat swelling, SOB or lightheadedness with hypotension: Unknown Has patient had a PCN reaction causing severe rash involving mucus membranes or skin necrosis:No Has patient had a PCN reaction that required hospitalization: Yes Has patient had  a PCN reaction occurring within the last 10 years: No If all of the above answers are "NO", then may proceed with Cephalosporin use.     No current facility-administered medications on file prior to encounter.    Current Outpatient Medications on File Prior to Encounter  Medication Sig  . albuterol (PROVENTIL) (2.5 MG/3ML) 0.083% nebulizer solution Take 2.5 mg by nebulization every 6 (six) hours as needed for wheezing.  Marland Kitchen aspirin 81 MG tablet Take 81 mg by mouth daily.    Marland Kitchen atorvastatin (LIPITOR) 20 MG tablet Take 20 mg by mouth daily at 6 PM.  . dextromethorphan (DELSYM) 30 MG/5ML liquid Take 5 mLs (30 mg total) by mouth 2 (two) times daily as needed for cough.  . docusate sodium (COLACE) 100 MG capsule Take 100 mg by mouth daily as needed for mild constipation or moderate constipation.   Marland Kitchen donepezil (ARICEPT) 10 MG tablet Take 10 mg by mouth at bedtime.  . famotidine (PEPCID) 20 MG tablet Take 20 mg by mouth daily.  . mirtazapine (REMERON) 30 MG tablet Take 30 mg by mouth at bedtime.  . mometasone-formoterol (DULERA) 200-5 MCG/ACT AERO Inhale 2 puffs into the lungs 2 (two) times daily.  . Multiple Vitamin (MULTIVITAMIN WITH MINERALS) TABS tablet Take 1 tablet by mouth daily.  Marland Kitchen nystatin (MYCOSTATIN) 100000 UNIT/ML suspension Use as directed 5 mLs in the mouth or throat 4 (four) times daily. 7 DAY COURSE TO END ON 12/27/2017  . oxybutynin (DITROPAN-XL) 5 MG 24 hr tablet Take 5 mg by mouth every evening.  . potassium chloride SA (K-DUR,KLOR-CON) 20 MEQ tablet Take 2 tablets (40 mEq total) by mouth daily.  Marland Kitchen torsemide (DEMADEX) 20 MG  tablet TAKE 40 MG twice daily (Patient taking differently: Take 60 mg by mouth 2 (two) times daily. )  . traMADol (ULTRAM) 50 MG tablet Take 1 tablet (50 mg total) by mouth 2 (two) times daily for 3 days.    FAMILY HISTORY:  Her family history includes Arthritis in her unknown relative; Asthma in her unknown relative; Diabetes in her unknown relative.  SOCIAL  HISTORY: She  reports that she has quit smoking. She has never used smokeless tobacco. She reports that she does not drink alcohol or use drugs.  REVIEW OF SYSTEMS:   Unable to obtain due to intubation  SUBJECTIVE:  PRVC: 100%, 10 peep Awake, following commands Afebrile  VITAL SIGNS: BP (!) 81/59   Pulse 69   Temp (!) 97.2 F (36.2 C) (Oral)   Resp (!) 23   Ht 5\' 4"  (1.626 m)   Wt 190 lb 14.7 oz (86.6 kg)   SpO2 100%   BMI 32.77 kg/m   HEMODYNAMICS:    VENTILATOR SETTINGS: Vent Mode: PRVC FiO2 (%):  [100 %] 100 % Set Rate:  [15 bmp-22 bmp] 22 bmp Vt Set:  [400 mL] 400 mL PEEP:  [5 cmH20] 5 cmH20  INTAKE / OUTPUT: I/O last 3 completed shifts: In: 203 [I.V.:3; IV Piggyback:200] Out: -   PHYSICAL EXAMINATION: General:  Acute on chronically ill appearing female, laying in bed, intubated, alert, in no acute distress Neuro:  Awake and alert, follows commands, no focal deficits HEENT:  Atraumatic, normocephalic, neck supple, MM moist/pink Cardiovascular:  RRR, s1s2, no M/R/G Lungs:  Coarse on right, clear on left, no wheezing, even, non-labored, vent assisted, small amount of bright red blood in ETT Abdomen:  Soft, nontender, BS+ x4 Musculoskeletal:  No deformities, 2+ bilateral LE edema Skin:  No obvious rashes, lesions, or ulcerations  LABS:  BMET Recent Labs  Lab 12/20/17 0700 12/12/2017 1543 12/22/17 0510  NA 143 142 145  K 5.0 5.7* 5.1  CL 96* 96* 98  CO2 42* 39* 41*  BUN 46* 55* 56*  CREATININE 1.64* 1.73* 1.68*  GLUCOSE 96 127* 118*    Electrolytes Recent Labs  Lab 12/20/17 0700 12/28/2017 1543 12/22/17 0510  CALCIUM 8.8* 9.0 8.7*    CBC Recent Labs  Lab 12/20/17 0700 12/15/2017 1543 12/22/17 1206  WBC 4.7 6.2 6.7  HGB 10.7* 11.3* 10.4*  HCT 33.9* 34.9* 32.4*  PLT 86* 104* 92*    Coag's No results for input(s): APTT, INR in the last 168 hours.  Sepsis Markers Recent Labs  Lab 12/11/2017 1556  LATICACIDVEN 1.66    ABG Recent Labs   Lab 12/22/17 1210  PHART 7.316*  PCO2ART 75.4*  PO2ART 42.0*    Liver Enzymes Recent Labs  Lab 12/16/2017 1543  AST 40  ALT 30  ALKPHOS 84  BILITOT 0.8  ALBUMIN 3.2*    Cardiac Enzymes Recent Labs  Lab 12/15/2017 1919 12/22/17 0045  TROPONINI 0.27* 0.29*    Glucose Recent Labs  Lab 12/16/17 0832 12/16/17 1100  GLUCAP 110* 108*    Imaging Dg Chest Port 1 View  Result Date: 12/22/2017 CLINICAL DATA:  Intubation EXAM: PORTABLE CHEST 1 VIEW COMPARISON:  Yesterday FINDINGS: Under penetrated study due to patient size and portable technique. New endotracheal tube with tip between the clavicular heads and carina. An orogastric tube reaches the stomach. Cardiomegaly with vascular congestion and probable layering pleural fluid. Increased airspace opacity in the right upper lobe. IMPRESSION: 1. New endotracheal and orogastric tubes in good position. 2.  CHF pattern. 3. Increased airspace opacity in the right upper lobe, asymmetric edema versus superimposed pneumonia Electronically Signed   By: Marnee SpringJonathon  Watts M.D.   On: 12/22/2017 12:23   Dg Chest Port 1 View  Result Date: 03/04/18 CLINICAL DATA:  Progressive shortness of breath. EXAM: PORTABLE CHEST 1 VIEW COMPARISON:  One-view chest x-ray 12/15/2017. FINDINGS: The heart is enlarged. New asymmetric right upper lobe airspace disease is present. Bilateral pleural effusions and bibasilar airspace disease are relatively similar to the prior exam. Atherosclerotic changes are again noted at the aortic arch. IMPRESSION: 1. New right upper lobe pneumonia. 2. Similar appearance of bilateral pleural effusions and basilar airspace disease, likely atelectasis. There is underlying congestive heart failure. Electronically Signed   By: Marin Robertshristopher  Mattern M.D.   On: 010/04/19 16:18     STUDIES:  CT Chest w/ Contrast 7/24>> Bronchoscopy 7/24>>  CULTURES: Blood x2 7/23>> MRSA PCR 7/23>> Negative Strep Pneumo urinary antigen  7/24>> Legionella urinary antigen 7/24>> Tracheal aspirate 7/24>> Urine culture 7/24>>  ANTIBIOTICS: Aztreonam 7/23>> Vancomycin 7/23>>  SIGNIFICANT EVENTS: 06-19-17>> [facility]22/17>> Intubated, hemoptysis, [facility]  LINES/TUBES: ETT 7/24>>  DISCUSSION: 84 y.o. Female with Acute on Chronic Hypoxic Respiratory failure requiring intubation in setting of CHF exacerbation vs R PNA (aspiration) vs COPD exacerbation, and hemoptysis vs hematemsis  ASSESSMENT / PLAN:  PULMONARY A: Acute on Chronic Hypoxic Respiratory Failure Bilateral pleural effusions ? Aspiration PNA Hemoptysis -After bronch, seems more likely aspirating blood from ? GI bleed Hx: COPD, Asthma P:   PRVC: 8 cc/kg Wean FiO2 & Peep as tolerated to maintain O2 sats 88 to 92% VAP Bundle  Pulmonary Hygiene Bedside Bronch 7/24 CT Chest 7/24 Follow intermittent CXR & ABG Will cover empirically for ? Aspiration PNA  CARDIOVASCULAR A:  Acute on Chronic CHF (diastolic) -BNP 1,977 Elevated Troponin Hx: CHF (diastolic), HTN P:  ICU monitoring Maintain MAP >65 Trend Troponin Obtain ECHO & EKG Continue Lasix Hold antihypertensives  RENAL A:   AKI P:   Monitor I&O's / urinary output Trend BMP Ensure adequate renal perfusion Avoid nephrotoxic agents as able Replace electrolytes as indicated   GASTROINTESTINAL A:   Rule out GI Bleed - post FOB 7/24 on arrival with concern for possible GI source for bleeding P:   NPO Protonix gtt Consult GI, appreciate input Fecal occult blood testing  HEMATOLOGIC A:   Anemia Thrombocytopenia P:  Monitor for s/sx of bleeding Trend CBC SCD's for VTE prophylaxis (given hemoptysis) Transfuse for Hgb<7 Transfuse for Plt<50 and active bleeding   INFECTIOUS A:   Increased opacity RUL on CXR, ? Aspiration PNA -No fevers or leukocytosis P:   Monitor fever curve Trend WBC & PCT Pan culture Follow cultures Abx as above  ENDOCRINE A:    Hx: DM II   P:   CBG's SSI Follow ICU hypo/hyperglycemia protocol  NEUROLOGIC A:   Sedation needs in setting of mechanical ventilation Hx: Dementia P:   RASS goal: 0 to -1 Prn fentanyl and versed pushes maintain RASS goal Continue Aricept, Remeron   FAMILY  - Updates: No family at bedside 12/22/17 during NP rounds  - Inter-disciplinary family meet or Palliative Care meeting due by: 12/29/17    Harlon DittyJeremiah Keene, AGACNP-BC [place] [facility]   12/22/2017, 2:57 PM  Attending Note:  84 year old female with dementia that remains a full code.  Patient started vomiting and coughing up blood and was intubated.  It was difficult to ascertain if  this is hemoptysis or hematemesis from the history.  On exam, coarse BS diffusely.  Blood from both the ETT and NGT.  I reviewed CXR myself, opacification on the dependent parts of the right lung (inferior part of the RUL and RLL).  Discussed with PCCM-NP.  I performed an upper airway exam via a bronchoscope, no bleeding noted.  I performed a bronch that upon washing both mainstem bronchi there was not evidence of bleeding after lavage.  I suspect the bleeding is from the GI tract then aspirate.  Will pan culture.  Vanc/aztreonam.  F/U on cultures.  GI consult called, discussed with Dr. Bosie Clos, recommendations followed and will be seen in AM.  PPI drip and NPO.  The patient is critically ill with multiple organ systems failure and requires high complexity decision making for assessment and support, frequent evaluation and titration of therapies, application of advanced monitoring technologies and extensive interpretation of multiple databases.   Critical Care Time devoted to patient care services described in this note is  85  Minutes. This time reflects time of care of this signee Dr Koren Bound. This critical care time does not reflect procedure time, or teaching time or supervisory time of PA/NP/Med student/Med Resident  etc but could involve care discussion time.  Alyson Reedy, M.D. [facility] Pulmonary/Critical Care Medicine. Pager: [phone]. After hours pager: [phone].

## 2017-12-22 NOTE — Procedures (Signed)
Arterial Catheter Insertion Procedure Note Brittney Li [id number] 05/30/1930  Procedure: Insertion of Arterial Catheter  Indications: Blood pressure monitoring  Procedure Details Consent: Unable to obtain consent because of emergent medical necessity. Time Out: Verified patient identification, verified procedure, site/side was marked, verified correct patient position, special equipment/implants available, medications/allergies/relevent history reviewed, required imaging and test results available.  Performed  Maximum sterile technique was used including antiseptics, cap, gloves, gown, hand hygiene, mask and sheet. Skin prep: Chlorhexidine; local anesthetic administered 20 gauge catheter was inserted into right radial artery using the Seldinger technique. ULTRASOUND GUIDANCE USED: NO Evaluation Blood flow good; BP tracing good. Complications: No apparent complications.   Brittney Li,Brittney Li 12/22/2017

## 2017-12-22 NOTE — Progress Notes (Signed)
Patient currently coughing up large amounts of blood, patient complains of not being able to breath. Patient shaking and breathing more rapidly. This nurse and Tech in the room at present. Code called at this time. Patient became unresponsive, increase amount of blood with coughing, patient still had pulse, agonal breathing, patient moved from bedside commode to the bed with assist of  Four nurses. Patient still has pulse, patient SPO2 51% on 4 L Helix, rescue breathing started. Patient was Intubated by Dr. Sharee PimpleWynn, patient transported to ICU Room 11.

## 2017-12-22 NOTE — Progress Notes (Signed)
Central telemetry informed this nurse patient is currently in A-Fib. MD made aware.

## 2017-12-22 NOTE — Evaluation (Signed)
Physical Therapy Evaluation Patient Details Name: Brittney Li MRN: [id number] DOB: [birth year] Today's Date: 12/22/2017   History of Present Illness  Brittney Li is a 84 y.o. female with medical history significant of CHF and dementia chronic respiratory failure presents with shortness of breath.  Patient currently resides at a nursing  facility.  She  had increasing shortness of breath and cough over the past few days.  Denies any fever but did feel warm today.  Does have some dementia so was not very extensive with her history.  Was found to have an increasing oxygen demand here in the ED.  Admits that she only uses 3 L and needed 4 L here to be near 90% at rest.  Patient was found to have changes suggestive of a right upper lobe pneumonia and CHF here today..  Patient was  discharge on July 19 for CHF.  Per report, She had changes of CHF on Cxr yesterday at her facility  and they  increased her Demadex but her symptoms continue to worsen.    Clinical Impression  Patient limited to a few steps at bedside due to generalized weakness, fatigue and SOB with O2 saturation dropping to 87% while on 4 LPM O2, able to recover O2 saturation above 90% after resting.  Patient tolerated sitting up in chair to eat breakfast after therapy - nursing staff notified.  Patient will benefit from continued physical therapy in hospital and recommended venue below to increase strength, balance, endurance for safe ADLs and gait.    Follow Up Recommendations SNF    Equipment Recommendations  None recommended by PT    Recommendations for Other Services       Precautions / Restrictions Precautions Precautions: Fall Restrictions Weight Bearing Restrictions: No      Mobility  Bed Mobility Overal bed mobility: Needs Assistance Bed Mobility: Supine to Sit     Supine to sit: Min assist     General bed mobility comments: had to use siderail  Transfers Overall transfer level: Needs assistance Equipment  used: Rolling walker (2 wheeled) Transfers: Sit to/from [employer] Sit to Stand: Min assist;Mod assist Stand pivot transfers: Min assist;Mod assist       General transfer comment: slow labored movement  Ambulation/Gait Ambulation/Gait assistance: Mod assist Gait Distance (Feet): 3 Feet Assistive device: Rolling walker (2 wheeled) Gait Pattern/deviations: Decreased step length - right;Decreased step length - left;Decreased stride length Gait velocity: slow   General Gait Details: limited to 4-5 short unsteady side steps to transfer to chair due to SOB and fatigue  Stairs            Wheelchair Mobility    Modified Rankin (Stroke Patients Only)       Balance Overall balance assessment: Needs assistance Sitting-balance support: No upper extremity supported;Feet supported Sitting balance-Leahy Scale: Good     Standing balance support: Bilateral upper extremity supported;During functional activity Standing balance-Leahy Scale: Fair                               Pertinent Vitals/Pain Pain Assessment: 0-10 Pain Score: 7  Pain Location: left side Pain Descriptors / Indicators: Discomfort;Sore Pain Intervention(s): Limited activity within patient's tolerance;Monitored during session    Home Living Family/patient expects to be discharged to:: Skilled nursing facility Living Arrangements: Alone Available Help at Discharge: Personal care attendant;Available PRN/intermittently Type of Home: Apartment Home Access: Ramped entrance     Home Layout: One  level Home Equipment: Walker - standard;Bedside commode;Tub bench;Wheelchair - manual;Cane - single point;Walker - 2 wheels;Walker - 4 wheels;Shower seat      Prior Function Level of Independence: Needs assistance   Gait / Transfers Assistance Needed: household ambulation with RW  ADL's / Homemaking Assistance Needed: home aides 7 hours/day x 6 days/week, 5 hours/day on Saturday         Hand Dominance        Extremity/Trunk Assessment   Upper Extremity Assessment Upper Extremity Assessment: Generalized weakness    Lower Extremity Assessment Lower Extremity Assessment: Generalized weakness    Cervical / Trunk Assessment Cervical / Trunk Assessment: Kyphotic  Communication   Communication: No difficulties  Cognition Arousal/Alertness: Awake/alert Behavior During Therapy: WFL for tasks assessed/performed Overall Cognitive Status: Within Functional Limits for tasks assessed                                        General Comments      Exercises     Assessment/Plan    PT Assessment Patient needs continued PT services  PT Problem List Decreased strength;Decreased activity tolerance;Decreased balance;Decreased mobility       PT Treatment Interventions Gait training;Stair training;Functional mobility training;Therapeutic activities;Therapeutic exercise;Patient/family education    PT Goals (Current goals can be found in the Care Plan section)  Acute Rehab PT Goals Patient Stated Goal: return home after rehab PT Goal Formulation: With patient Time For Goal Achievement: 01/08/18 Potential to Achieve Goals: Good    Frequency Min 3X/week   Barriers to discharge        Co-evaluation               AM-PAC PT "6 Clicks" Daily Activity  Outcome Measure Difficulty turning over in bed (including adjusting bedclothes, sheets and blankets)?: None Difficulty moving from lying on back to sitting on the side of the bed? : A Little Difficulty sitting down on and standing up from a chair with arms (e.g., wheelchair, bedside commode, etc,.)?: A Lot Help needed moving to and from a bed to chair (including a wheelchair)?: A Lot Help needed walking in hospital room?: A Lot Help needed climbing 3-5 steps with a railing? : Total 6 Click Score: 14    End of Session Equipment Utilized During Treatment: Oxygen Activity Tolerance: Patient  tolerated treatment well;Patient limited by fatigue Patient left: in chair;with call bell/phone within reach Nurse Communication: Mobility status;Other (comment)(nursing staff informed that patient left up in chair) PT Visit Diagnosis: Unsteadiness on feet (R26.81);Other abnormalities of gait and mobility (R26.89);Muscle weakness (generalized) (M62.81)    Time: [id number] PT Time Calculation (min) (ACUTE ONLY): 31 min   Charges:   PT Evaluation $PT Eval Moderate Complexity: 1 Mod PT Treatments $Therapeutic Activity: 23-37 mins   PT G Codes:        9:01 AM, 12/22/17 Ocie BobJames Coy Rochford, MPT Physical Therapist with [phone] office [phone] mobile phone

## 2017-12-22 NOTE — Progress Notes (Signed)
Patient called this nurse to her room, patient coughing up moderate amounts of blood. MD notified.

## 2017-12-22 NOTE — Progress Notes (Addendum)
Attempted to obtain consent for bedside bronch from son x3, unable to reach by telephone. Called cousin, Karin GoldenLorraine, who verbalized that family would want this procedure and understood the emergent nature of it.    Able to reach son, obtained consent for bronch.

## 2017-12-22 NOTE — Progress Notes (Signed)
Troponin 0.27 increased to 0.29. but was 0.34 in the ER before admission. Troponin order x6  If next one shows a climb will notified MD. Pt is asymptomatic.

## 2017-12-22 NOTE — Anesthesia Procedure Notes (Addendum)
Procedure Name: Intubation Date/Time: 12/22/2017 11:30 AM Performed by: Nicanor Alcon, MD Pre-anesthesia Checklist: Patient identified, Emergency Drugs available, Suction available, Patient being monitored and Timeout performed Patient Re-evaluated:Patient Re-evaluated prior to induction Oxygen Delivery Method: Ambu bag Preoxygenation: Pre-oxygenation with 100% oxygen Induction Type: IV induction, Rapid sequence and Cricoid Pressure applied Ventilation: Mask ventilation without difficulty Laryngoscope Size: Mac and 4 Grade View: Grade I Tube type: Oral Tube size: 7.5 mm Number of attempts: 1 Airway Equipment and Method: Stylet Placement Confirmation: ETT inserted through vocal cords under direct vision,  CO2 detector and breath sounds checked- equal and bilateral Secured at: 22 cm Tube secured with: Tape Comments: Easy patient room intubation for hematemesis and hypoxemia.

## 2017-12-22 NOTE — Progress Notes (Signed)
Pt had 11 beat run of v-tach. MD aware.

## 2017-12-23 ENCOUNTER — Inpatient Hospital Stay (HOSPITAL_COMMUNITY): Payer: Medicare Other

## 2017-12-23 DIAGNOSIS — I361 Nonrheumatic tricuspid (valve) insufficiency: Secondary | ICD-10-CM

## 2017-12-23 DIAGNOSIS — E440 Moderate protein-calorie malnutrition: Secondary | ICD-10-CM

## 2017-12-23 DIAGNOSIS — J189 Pneumonia, unspecified organism: Secondary | ICD-10-CM

## 2017-12-23 LAB — BASIC METABOLIC PANEL
ANION GAP: 12 (ref 5–15)
ANION GAP: 9 (ref 5–15)
BUN: 58 mg/dL — ABNORMAL HIGH (ref 8–23)
BUN: 56 mg/dL — AB (ref 8–23)
CALCIUM: 8.2 mg/dL — AB (ref 8.9–10.3)
CHLORIDE: 100 mmol/L (ref 98–111)
CHLORIDE: 103 mmol/L (ref 98–111)
CO2: 33 mmol/L — AB (ref 22–32)
CO2: 37 mmol/L — ABNORMAL HIGH (ref 22–32)
CREATININE: 1.71 mg/dL — AB (ref 0.44–1.00)
Calcium: 9 mg/dL (ref 8.9–10.3)
Creatinine, Ser: 1.75 mg/dL — ABNORMAL HIGH (ref 0.44–1.00)
GFR calc Af Amer: 29 mL/min — ABNORMAL LOW (ref >60)
GFR calc non Af Amer: 26 mL/min — ABNORMAL LOW (ref >60)
GFR, EST AFRICAN AMERICAN: 30 mL/min — AB (ref >60)
GFR, EST NON AFRICAN AMERICAN: 25 mL/min — AB (ref >60)
GLUCOSE: 88 mg/dL (ref 70–99)
Glucose, Bld: 102 mg/dL — ABNORMAL HIGH (ref 70–99)
POTASSIUM: 4 mmol/L (ref 3.5–5.1)
Potassium: 5.6 mmol/L — ABNORMAL HIGH (ref 3.5–5.1)
SODIUM: 145 mmol/L (ref 135–145)
Sodium: 149 mmol/L — ABNORMAL HIGH (ref 135–145)

## 2017-12-23 LAB — BLOOD GAS, ARTERIAL
Acid-Base Excess: 14 mmol/L — ABNORMAL HIGH (ref 0.0–2.0)
Bicarbonate: 38.4 mmol/L — ABNORMAL HIGH (ref 20.0–28.0)
DRAWN BY: 535271
FIO2: 50
O2 SAT: 97.2 %
PCO2 ART: 52.4 mmHg — AB (ref 32.0–48.0)
PEEP: 10 cmH2O
Patient temperature: 100.4
RATE: 14 resp/min
VT: 400 mL
pH, Arterial: 7.483 — ABNORMAL HIGH (ref 7.350–7.450)
pO2, Arterial: 93.9 mmHg (ref 83.0–108.0)

## 2017-12-23 LAB — URINE CULTURE
Culture: NO GROWTH
Special Requests: NORMAL

## 2017-12-23 LAB — GLUCOSE, CAPILLARY
GLUCOSE-CAPILLARY: 79 mg/dL (ref 70–99)
GLUCOSE-CAPILLARY: 85 mg/dL (ref 70–99)
GLUCOSE-CAPILLARY: 89 mg/dL (ref 70–99)
Glucose-Capillary: 77 mg/dL (ref 70–99)
Glucose-Capillary: 74 mg/dL (ref 70–99)
Glucose-Capillary: 81 mg/dL (ref 70–99)

## 2017-12-23 LAB — CBC
HCT: 25.7 % — ABNORMAL LOW (ref 36.0–46.0)
HEMOGLOBIN: 8.6 g/dL — AB (ref 12.0–15.0)
MCH: 29.7 pg (ref 26.0–34.0)
MCHC: 33.5 g/dL (ref 30.0–36.0)
MCV: 88.6 fL (ref 78.0–100.0)
PLATELETS: 137 10*3/uL — AB (ref 150–400)
RBC: 2.9 MIL/uL — AB (ref 3.87–5.11)
RDW: 14.2 % (ref 11.5–15.5)
WBC: 8.4 10*3/uL (ref 4.0–10.5)

## 2017-12-23 LAB — PROCALCITONIN: Procalcitonin: 0.14 ng/mL

## 2017-12-23 LAB — HEMOGLOBIN AND HEMATOCRIT, BLOOD
HCT: 25.5 % — ABNORMAL LOW (ref 36.0–46.0)
HEMATOCRIT: 26.4 % — AB (ref 36.0–46.0)
HEMATOCRIT: 25.2 % — AB (ref 36.0–46.0)
HEMOGLOBIN: 8.7 g/dL — AB (ref 12.0–15.0)
HEMOGLOBIN: 8.5 g/dL — AB (ref 12.0–15.0)
Hemoglobin: 8.4 g/dL — ABNORMAL LOW (ref 12.0–15.0)

## 2017-12-23 LAB — POCT I-STAT 3, ART BLOOD GAS (G3+)
Acid-Base Excess: 16 mmol/L — ABNORMAL HIGH (ref 0.0–2.0)
Bicarbonate: 39.3 mmol/L — ABNORMAL HIGH (ref 20.0–28.0)
O2 SAT: 95 %
PCO2 ART: 44.2 mmHg (ref 32.0–48.0)
Patient temperature: 100
TCO2: 41 mmol/L — AB (ref 22–32)
pH, Arterial: 7.559 — ABNORMAL HIGH (ref 7.350–7.450)
pO2, Arterial: 71 mmHg — ABNORMAL LOW (ref 83.0–108.0)

## 2017-12-23 LAB — TROPONIN I
Troponin I: 0.44 ng/mL (ref <0.03)
Troponin I: 0.48 ng/mL (ref <0.03)

## 2017-12-23 LAB — STREP PNEUMONIAE URINARY ANTIGEN: Strep Pneumo Urinary Antigen: NEGATIVE

## 2017-12-23 LAB — MAGNESIUM
MAGNESIUM: 2.2 mg/dL (ref 1.7–2.4)
Magnesium: 2.2 mg/dL (ref 1.7–2.4)

## 2017-12-23 LAB — ECHOCARDIOGRAM COMPLETE
HEIGHTINCHES: 64 in
Weight: 3100.55 oz

## 2017-12-23 LAB — PHOSPHORUS
Phosphorus: 3.1 mg/dL (ref 2.5–4.6)
Phosphorus: 3.1 mg/dL (ref 2.5–4.6)

## 2017-12-23 LAB — CORTISOL: CORTISOL PLASMA: 7.8 ug/dL

## 2017-12-23 MED ORDER — DEXTROSE 10 % IV SOLN
INTRAVENOUS | Status: DC
  Administered 2017-12-23: 12:00:00 via INTRAVENOUS

## 2017-12-23 MED ORDER — PRO-STAT SUGAR FREE PO LIQD
30.0000 mL | Freq: Two times a day (BID) | ORAL | Status: DC
  Administered 2017-12-23 – 2017-12-29 (×12): 30 mL
  Filled 2017-12-23 (×12): qty 30

## 2017-12-23 MED ORDER — VITAL HIGH PROTEIN PO LIQD: 1000.0000 mL | ORAL | Status: DC

## 2017-12-23 MED ORDER — SODIUM CHLORIDE 0.9 % IV BOLUS
500.0000 mL | Freq: Once | INTRAVENOUS | Status: AC
  Administered 2017-12-23: 500 mL via INTRAVENOUS

## 2017-12-23 MED ORDER — SODIUM CHLORIDE 0.9 % IV BOLUS
250.0000 mL | Freq: Once | INTRAVENOUS | Status: AC
  Administered 2017-12-23: 250 mL via INTRAVENOUS

## 2017-12-23 MED ORDER — SODIUM CHLORIDE 0.9 % IV SOLN
2.0000 g | Freq: Once | INTRAVENOUS | Status: AC
  Administered 2017-12-23: 2 g via INTRAVENOUS
  Filled 2017-12-23: qty 20

## 2017-12-23 MED ORDER — VITAL HIGH PROTEIN PO LIQD
1000.0000 mL | ORAL | Status: DC
  Administered 2017-12-23 – 2017-12-27 (×9): 1000 mL

## 2017-12-23 NOTE — Progress Notes (Signed)
Dr. Darrick Pennaeterding made aware of pt's continued hypotension, MAP's in the 40's via a-line.  Received order for 250cc fluid bolus.  Will continue to monitor.

## 2017-12-23 NOTE — Progress Notes (Signed)
Dr. Darrick Pennaeterding made aware of 14 beat V-tach run reflected in a-line pressure.  Will continue to monitor.

## 2017-12-23 NOTE — Consult Note (Signed)
Referring Provider: Dr. Yacoub Primary Care Physician:  Fanta, Tesfaye, MD Primary Gastroenterologist:  Unassigned  Reason for Consultation:  GI bleed  HPI: Brittney Li is a 84 y.o. female with multiple medical problems who was at [facility] being treated for pneumonia and CHF exacerbation when she developed respiratory failure with bleeding that was thought to be either hemoptysis or hematemesis. Source was not known and blood and clots were reportedly noted during intubation without a source pinpointed. OG tube initially had bloody drainage that cleared with flushing. Bronchosopy negative for a source of the bleeding. No melena or hematochezia and nonbloody fluid returned from OG per nursing staff this morning. Cousin at bedside. Patient sedated and intubated.  Past Medical History:  Diagnosis Date  . Arthritis   . Asthma   . Bronchitis   . CHF (congestive heart failure) (HCC)    a. ECHO (01/2013) EF 60-65%, grade II diastolic dysfx, LA mildly dilated  . COPD (chronic obstructive pulmonary disease) (HCC)   . Dementia   . Diabetes mellitus without complication (HCC)   . Hyperkalemia   . Hypertension   . Renal disorder   . Respiratory failure (HCC)     Past Surgical History:  Procedure Laterality Date  . ABDOMINAL HYSTERECTOMY  1975  . hands      Prior to Admission medications   Medication Sig Start Date End Date Taking? Authorizing Provider  albuterol (PROVENTIL) (2.5 MG/3ML) 0.083% nebulizer solution Take 2.5 mg by nebulization every 6 (six) hours as needed for wheezing.   Yes [provider]  aspirin 81 MG tablet Take 81 mg by mouth daily.     Yes [provider]  atorvastatin (LIPITOR) 20 MG tablet Take 20 mg by mouth daily at 6 PM.   Yes [provider]  dextromethorphan (DELSYM) 30 MG/5ML liquid Take 5 mLs (30 mg total) by mouth 2 (two) times daily as needed for cough. 12/17/17  Yes Johnson, Clanford L, MD  docusate sodium (COLACE) 100  MG capsule Take 100 mg by mouth daily as needed for mild constipation or moderate constipation.    Yes [provider]  donepezil (ARICEPT) 10 MG tablet Take 10 mg by mouth at bedtime.   Yes [provider]  famotidine (PEPCID) 20 MG tablet Take 20 mg by mouth daily.   Yes [provider]  mirtazapine (REMERON) 30 MG tablet Take 30 mg by mouth at bedtime.   Yes [provider]  mometasone-formoterol (DULERA) 200-5 MCG/ACT AERO Inhale 2 puffs into the lungs 2 (two) times daily. 02/06/13  Yes Fanta, Tesfaye, MD  Multiple Vitamin (MULTIVITAMIN WITH MINERALS) TABS tablet Take 1 tablet by mouth daily.   Yes [provider]  nystatin (MYCOSTATIN) 100000 UNIT/ML suspension Use as directed 5 mLs in the mouth or throat 4 (four) times daily. 7 DAY COURSE TO END ON 12/27/2017   Yes [provider]  oxybutynin (DITROPAN-XL) 5 MG 24 hr tablet Take 5 mg by mouth every evening.   Yes [provider]  potassium chloride SA (K-DUR,KLOR-CON) 20 MEQ tablet Take 2 tablets (40 mEq total) by mouth daily. 12/19/17  Yes Johnson, Clanford L, MD  torsemide (DEMADEX) 20 MG tablet TAKE 40 MG twice daily Patient taking differently: Take 60 mg by mouth 2 (two) times daily.  12/17/17  Yes Johnson, Clanford L, MD  traMADol (ULTRAM) 50 MG tablet Take 1 tablet (50 mg total) by mouth 2 (two) times daily for 3 days. 12/13/2017 12/24/17 Yes Lassen, Arlo   C, PA-C    Scheduled Meds: . budesonide (PULMICORT) nebulizer solution  0.5 mg Nebulization BID  . chlorhexidine gluconate (MEDLINE KIT)  15 mL Mouth Rinse BID  . donepezil  10 mg Oral QHS  . furosemide  40 mg Intravenous Daily  . insulin aspart  1-3 Units Subcutaneous Q4H  . ipratropium-albuterol  3 mL Nebulization Q6H  . mouth rinse  15 mL Mouth Rinse 10 times per day  . mirtazapine  30 mg Oral QHS  . multivitamin with minerals  1 tablet Oral Daily  . nystatin  5 mL Mouth/Throat QID  . oxybutynin  5 mg Oral QPM  . [START  ON 12/26/2017] pantoprazole  40 mg Intravenous Q12H  . sodium chloride flush  3 mL Intravenous Q12H   Continuous Infusions: . sodium chloride 10 mL/hr at 12/23/17 0212  . aztreonam Stopped (12/23/17 0107)  . pantoprozole (PROTONIX) infusion 8 mg/hr (12/23/17 0800)  . vancomycin Stopped (12/22/17 2230)   PRN Meds:.sodium chloride, acetaminophen, albuterol, docusate sodium, fentaNYL (SUBLIMAZE) injection, fentaNYL (SUBLIMAZE) injection, midazolam, midazolam, ondansetron (ZOFRAN) IV, sodium chloride flush  Allergies as of 12/14/2017 - Review Complete 12/01/2017  Allergen Reaction Noted  . Erythromycin Other (See Comments)   . Fluviral [flu virus vaccine]  10/01/2017  . Penicillins Other (See Comments)     Family History  Problem Relation Age of Onset  . Arthritis Unknown   . Asthma Unknown   . Diabetes Unknown     Social History   Socioeconomic History  . Marital status: Divorced    Spouse name: Not on file  . Number of children: Not on file  . Years of education: 12th grade  . Highest education level: Not on file  Occupational History  . Occupation: retired    Employer: RETIRED  Social Needs  . Financial resource strain: Not on file  . Food insecurity:    Worry: Not on file    Inability: Not on file  . Transportation needs:    Medical: Not on file    Non-medical: Not on file  Tobacco Use  . Smoking status: Former Smoker  . Smokeless tobacco: Never Used  . Tobacco comment: quit smoking 1990  Substance and Sexual Activity  . Alcohol use: No  . Drug use: No  . Sexual activity: Not on file  Lifestyle  . Physical activity:    Days per week: Not on file    Minutes per session: Not on file  . Stress: Not on file  Relationships  . Social connections:    Talks on phone: Not on file    Gets together: Not on file    Attends religious service: Not on file    Active member of club or organization: Not on file    Attends meetings of clubs or organizations: Not on file     Relationship status: Not on file  . Intimate partner violence:    Fear of current or ex partner: Not on file    Emotionally abused: Not on file    Physically abused: Not on file    Forced sexual activity: Not on file  Other Topics Concern  . Not on file  Social History Narrative  . Not on file    Review of Systems: All negative except as stated above in HPI.  Physical Exam: Vital signs: Vitals:   12/23/17 0820 12/23/17 0900  BP:  (!) 85/66  Pulse:  98  Resp:  (!) 26  Temp:  (!) 100.6 F (38.1 C)    SpO2: 98% 95%   Last BM Date: 12/09/2017 General:   Sedated intubated; elderly, frail Head: normocephalic, atraumatic Lungs:  Coarse breath sounds Heart:  Regular rate and rhythm; no murmurs, clicks, rubs,  or gallops. Abdomen: diffuse tenderness with guarding, nondistended, +BS  Rectal:  Deferred Ext: no edema  GI:  Lab Results: Recent Labs    12/22/17 1206 12/22/17 1650 12/23/17 0136 12/23/17 0544  WBC 6.7 11.0* 8.4  --   HGB 10.4* 9.8* 8.6* 8.7*  HCT 32.4* 28.2* 25.7* 26.4*  PLT 92* 100* 137*  --    BMET Recent Labs    12/04/2017 1543 12/22/17 0510 12/23/17 0136  NA 142 145 145  K 5.7* 5.1 5.6*  CL 96* 98 100  CO2 39* 41* 33*  GLUCOSE 127* 118* 102*  BUN 55* 56* 58*  CREATININE 1.73* 1.68* 1.71*  CALCIUM 9.0 8.7* 8.2*   LFT Recent Labs    12/25/2017 1543  PROT 6.9  ALBUMIN 3.2*  AST 40  ALT 30  ALKPHOS 84  BILITOT 0.8   PT/INR Recent Labs    12/22/17 1620  LABPROT 16.1*  INR 1.30     Studies/Results: Portable Chest Xray  Result Date: 12/23/2017 CLINICAL DATA:  Acute respiratory failure. EXAM: PORTABLE CHEST 1 VIEW COMPARISON:  12/22/2017.  12/27/2017. FINDINGS: Endotracheal tube and NG tube in stable position. Stable cardiomegaly. Persistent right upper lobe and bibasilar pulmonary infiltrates/edema again noted. Bilateral small pleural effusions, right side greater than left again noted. No pneumothorax. Chest is unchanged from prior exam.  IMPRESSION: 1.  Lines and tubes in stable position. 2. Stable cardiomegaly. Persistent right upper lobe and bibasilar pulmonary infiltrates/edema again noted. Bilateral small pleural effusions, right side greater than left again noted. Electronically Signed   By: Thomas  Register   On: 12/23/2017 05:57   Dg Chest Port 1 View  Result Date: 12/22/2017 CLINICAL DATA:  Acute respiratory failure. EXAM: PORTABLE CHEST 1 VIEW COMPARISON:  Chest x-ray from earlier same day and chest x-ray dated 12/09/2017. FINDINGS: Endotracheal tube is well positioned with tip approximately 3 cm above the carina. Enteric tube passes below the diaphragm. Stable cardiomegaly. Atherosclerotic changes noted at the aortic arch. Dense opacity persists in the RIGHT upper lobe, stable compared to today's earlier exam, increased compared to yesterday's exam. Continued central pulmonary vascular congestion. Again noted is a probable layering pleural effusion at the RIGHT lung base. IMPRESSION: 1. Endotracheal tube is well positioned with tip approximately 3 cm above the carina. 2. Persistent dense opacity within the RIGHT upper lobe, presumably pneumonia or aspiration, stable compared to today's earlier chest x-ray but increased compared to the chest x-ray of 12/12/2017. 3. RIGHT pleural effusion, small to moderate in size, not significantly changed. 4. Stable cardiomegaly. 5. Aortic atherosclerosis. Electronically Signed   By: Stan  Maynard M.D.   On: 12/22/2017 16:41   Dg Chest Port 1 View  Result Date: 12/22/2017 CLINICAL DATA:  Intubation EXAM: PORTABLE CHEST 1 VIEW COMPARISON:  Yesterday FINDINGS: Under penetrated study due to patient size and portable technique. New endotracheal tube with tip between the clavicular heads and carina. An orogastric tube reaches the stomach. Cardiomegaly with vascular congestion and probable layering pleural fluid. Increased airspace opacity in the right upper lobe. IMPRESSION: 1. New endotracheal and  orogastric tubes in good position. 2. CHF pattern. 3. Increased airspace opacity in the right upper lobe, asymmetric edema versus superimposed pneumonia Electronically Signed   By: Jonathon  Watts M.D.   On: 12/22/2017 12:23   Dg   Chest Port 1 View  Result Date: 12/03/2017 CLINICAL DATA:  Progressive shortness of breath. EXAM: PORTABLE CHEST 1 VIEW COMPARISON:  One-view chest x-ray 12/15/2017. FINDINGS: The heart is enlarged. New asymmetric right upper lobe airspace disease is present. Bilateral pleural effusions and bibasilar airspace disease are relatively similar to the prior exam. Atherosclerotic changes are again noted at the aortic arch. IMPRESSION: 1. New right upper lobe pneumonia. 2. Similar appearance of bilateral pleural effusions and basilar airspace disease, likely atelectasis. There is underlying congestive heart failure. Electronically Signed   By: San Morelle M.D.   On: 11/29/2017 16:18    Impression/Plan: 84 yo with CHF, PNA with recent upper tract bleed in the setting of respiratory failure with source for bleeding not known. She does not have signs of ongoing GI bleeding and I am not convinced the source of her initial bleed was from her upper GI tract. Hgb 8.5 (9.8) and no overt GI bleeding that I would manage conservatively with Protonix drip and hold off on EGD unless starts showing signs of bleeding from the OG tube or melena/hematochezia. Will sign off. Dr. Oletta Lamas available to see tomorrow and this weekend if needed. D/W CCM.    LOS: 2 days   [place] C.  12/23/2017, 9:25 AM  Questions please call [phone]

## 2017-12-23 NOTE — Progress Notes (Signed)
Pt having periodic transient hypotension as noted in a-line pressures.  Received order for ABG from Dr. Darrick Pennaeterding.  AM labs are noted to be non-critical.  Will continue to monitor.

## 2017-12-23 NOTE — Progress Notes (Signed)
eLink Physician-Brief Progress Note Patient Name: Brittney HawsMary A Demond DOB: 06/02/1929 MRN: [id number]   Date of Service  12/23/2017  HPI/Events of Note  Pt with a run of wide-complex tachycardia with a transient dip in MAP-spontaneous termination. Electrolytes all within normal limits. Echo shows normal systolic Function, no RWMA, Grade 2 diastolic dysfunction. Pt ordinarily would benefit from beta blockers but recent history of bleeding of unclear source, and low normal blood pressures warrants caution.  eICU Interventions  Observe for now without Rx. Re-evaluate if arrhythmia recurs.        Thomasene Lotkoronkwo U Ogan 12/23/2017, 11:25 PM

## 2017-12-23 NOTE — Progress Notes (Signed)
Called to assess patient as RN reports several episodes of Vtach since yesterday and intermittent hypotension and fluctuations with discordance between aline and cuff pressures.  Treated with 500 ml NS bolus and currently normotensive.  No significant bloody output.  Labs drawn early check electrolytes which did not show derangements.   Vent changes per Elink for worsening alkaosis, patient not breathing over set rate.  P: Check EKG and troponin as troponin mildly elevated, not peaked yet TTE already ordered  Assess cortisol  H/h q 6  given >1gm drop since yesterday afternoon; GI to evaluate this am; remains on protonix gtt Recheck ABG at 0600 to ensure correcting alkalosis    Posey BoyerBrooke Simpson, AGACNP-BC [facility] Pgr: [phone] or if no answer [phone] 12/23/2017, 5:55 AM

## 2017-12-23 NOTE — Progress Notes (Signed)
RT ran ABG on patient.  The pH was 7.559.  RN called Dr. Darrick Pennaeterding and was informed to change Vt to 400.  RT called Dr. Darrick Pennaeterding for explanation and suggested changing rate first.  Dr. Darrick Pennaeterding said RT could change rate but she wanted Vt set at 400 and explained reason.  RT changed Vt to 400.  RT will continue to monitor patient.

## 2017-12-23 NOTE — Plan of Care (Signed)
  Problem: Nutrition: Goal: Adequate nutrition will be maintained Outcome: Progressing  Pt was started on TF today to maintain nutrition and adequate CBG levels. Pt tolerating well at this time.

## 2017-12-23 NOTE — Progress Notes (Signed)
  Echocardiogram 2D Echocardiogram has been performed.  [name], [name] 12/23/2017, 9:23 AM

## 2017-12-23 NOTE — Progress Notes (Signed)
Pt had 20 beat run of [facility]ach. [facility] Dr. Warrick Parisiangan notified. Will continue to monitor. Caswell Corwinana C Blossom Crume, RN 12/23/17 .10:58 PM

## 2017-12-23 NOTE — Progress Notes (Addendum)
PULMONARY / CRITICAL CARE MEDICINE   Name: Brittney Li MRN: [id number] DOB: 1929/10/06    ADMISSION DATE:  12/26/2017 CONSULTATION DATE:  12/22/17  REFERRING MD:  Dr. Dyann Kief [facility])  CHIEF COMPLAINT:  Acute Hypoxic Respiratory Failure, Hemoptysis  HISTORY OF PRESENT ILLNESS:   Brittney Li is a 84 y.o. Female with a PMH of chronic respiratory failure requiring 3L home H8,NIDP,OEUMPN, Diastolic CHF, Dementia who presented to [facility] ED on 7/23 with c/o progressive Shortness of breath and cough for several days.  Initial workup at [facility] was concerning for CHF exacerbation and RUL PNA.  Pt was recently treated for CHF and discharged to nursing facility on 12/17/17. On 7/24 while at [facility], pt with hypoxia and suffered respiratory arrest in the setting of hematemesis vs hemoptysis, necessitating intubation.   Pt transferred to [facility] later in afternoon on 12/22/17 for further management of Acute on Chronic Hypoxic Respiratory failure requiring intubation and Hemoptysis. PCCM is asked to admit the pt.  PAST MEDICAL HISTORY :  Brittney Li  has a past medical history of Arthritis, Asthma, Bronchitis, CHF (congestive heart failure) ([place]), COPD (chronic obstructive pulmonary disease) ([place]), Dementia, Diabetes mellitus without complication ([place]), Hyperkalemia, Hypertension, Renal disorder, and Respiratory failure ([place]).  PAST SURGICAL HISTORY: Brittney Li  has a past surgical history that includes hands and Abdominal hysterectomy (1975).  Allergies  Allergen Reactions  . Erythromycin Other (See Comments)    Unknown   . Fluviral [Flu Virus Vaccine]   . Penicillins Other (See Comments)    .Has patient had a PCN reaction causing immediate rash, facial/tongue/throat swelling, SOB or lightheadedness with hypotension: Unknown Has patient had a PCN reaction causing severe rash involving mucus membranes or skin necrosis:No Has patient had a PCN reaction that required hospitalization: Yes Has patient had  a PCN reaction occurring within the last 10 years: No If all of the above answers are "NO", then may proceed with Cephalosporin use.     No current facility-administered medications on file prior to encounter.    Current Outpatient Medications on File Prior to Encounter  Medication Sig  . albuterol (PROVENTIL) (2.5 MG/3ML) 0.083% nebulizer solution Take 2.5 mg by nebulization every 6 (six) hours as needed for wheezing.  Marland Kitchen aspirin 81 MG tablet Take 81 mg by mouth daily.    Marland Kitchen atorvastatin (LIPITOR) 20 MG tablet Take 20 mg by mouth daily at 6 PM.  . dextromethorphan (DELSYM) 30 MG/5ML liquid Take 5 mLs (30 mg total) by mouth 2 (two) times daily as needed for cough.  . docusate sodium (COLACE) 100 MG capsule Take 100 mg by mouth daily as needed for mild constipation or moderate constipation.   Marland Kitchen donepezil (ARICEPT) 10 MG tablet Take 10 mg by mouth at bedtime.  . famotidine (PEPCID) 20 MG tablet Take 20 mg by mouth daily.  . mirtazapine (REMERON) 30 MG tablet Take 30 mg by mouth at bedtime.  . mometasone-formoterol (DULERA) 200-5 MCG/ACT AERO Inhale 2 puffs into the lungs 2 (two) times daily.  . Multiple Vitamin (MULTIVITAMIN WITH MINERALS) TABS tablet Take 1 tablet by mouth daily.  Marland Kitchen nystatin (MYCOSTATIN) 100000 UNIT/ML suspension Use as directed 5 mLs in the mouth or throat 4 (four) times daily. 7 DAY COURSE TO END ON 12/27/2017  . oxybutynin (DITROPAN-XL) 5 MG 24 hr tablet Take 5 mg by mouth every evening.  . potassium chloride SA (K-DUR,KLOR-CON) 20 MEQ tablet Take 2 tablets (40 mEq total) by mouth daily.  Marland Kitchen torsemide (DEMADEX) 20 MG  tablet TAKE 40 MG twice daily (Patient taking differently: Take 60 mg by mouth 2 (two) times daily. )  . traMADol (ULTRAM) 50 MG tablet Take 1 tablet (50 mg total) by mouth 2 (two) times daily for 3 days.    FAMILY HISTORY:  Her family history includes Arthritis in her unknown relative; Asthma in her unknown relative; Diabetes in her unknown relative.  SOCIAL  HISTORY: Brittney Li  reports that Brittney Li has quit smoking. Brittney Li has never used smokeless tobacco. Brittney Li reports that Brittney Li does not drink alcohol or use drugs.  REVIEW OF SYSTEMS:   Unable to obtain due to intubation  SUBJECTIVE:  On ventilator Not on pressors or sedation  VITAL SIGNS: BP (!) 59/47 (BP Location: Left Leg)   Pulse 86   Temp (!) 100.6 F (38.1 C)   Resp 17   Ht 5\' 4"  (1.626 m)   Wt 193 lb 12.6 oz (87.9 kg)   SpO2 98%   BMI 33.26 kg/m   HEMODYNAMICS:    VENTILATOR SETTINGS: Vent Mode: PRVC FiO2 (%):  [40 %-100 %] 50 % Set Rate:  [15 bmp-24 bmp] 18 bmp Vt Set:  [400 mL-450 mL] 400 mL PEEP:  [5 cmH20-10 cmH20] 10 cmH20 Plateau Pressure:  [18 cmH20-28 cmH20] 24 cmH20  INTAKE / OUTPUT: I/O last 3 completed shifts: In: 3164.7 [P.O.:120; I.V.:1111.1; IV Piggyback:1933.5] Out: 837 [Urine:837]  PHYSICAL EXAMINATION: General:  Acute on chronically ill appearing female, intubated, alert, in no acute distress Neuro:  Awake and alert, follows commands, no focal deficits Cardiovascular:  RRR, s1s2, no MRG Lungs:  Coarse on right, clear on left, no wheezing, even, some gurgling Abdomen:  Soft, BS+ x4, mildly tender to palpation Musculoskeletal:  No deformities, 2+ bilateral LE edema Skin:  No obvious rashes, lesions, or ulcerations  LABS:  BMET Recent Labs  Lab 18-Jan-2018 1543 12/22/17 0510 12/23/17 0136  NA 142 145 145  K 5.7* 5.1 5.6*  CL 96* 98 100  CO2 39* 41* 33*  BUN 55* 56* 58*  CREATININE 1.73* 1.68* 1.71*  GLUCOSE 127* 118* 102*    Electrolytes Recent Labs  Lab 01/18/2018 1543 12/22/17 0510 12/23/17 0136 12/23/17 0544  CALCIUM 9.0 8.7* 8.2*  --   MG  --   --  2.2  --   PHOS  --   --   --  3.1    CBC Recent Labs  Lab 12/22/17 1206 12/22/17 1650 12/23/17 0136 12/23/17 0544  WBC 6.7 11.0* 8.4  --   HGB 10.4* 9.8* 8.6* 8.7*  HCT 32.4* 28.2* 25.7* 26.4*  PLT 92* 100* 137*  --     Coag's Recent Labs  Lab 12/22/17 1620  APTT 28  INR 1.30     Sepsis Markers Recent Labs  Lab Jan 18, 2018 1556 12/22/17 1620 12/23/17 0136  LATICACIDVEN 1.66  --   --   PROCALCITON  --  <0.10 0.14    ABG Recent Labs  Lab 12/22/17 1210 12/22/17 1627 12/23/17 0331  PHART 7.316* 7.506* 7.559*  PCO2ART 75.4* 51.0* 44.2  PO2ART 42.0* 219.0* 71.0*    Liver Enzymes Recent Labs  Lab 01-18-18 1543  AST 40  ALT 30  ALKPHOS 84  BILITOT 0.8  ALBUMIN 3.2*    Cardiac Enzymes Recent Labs  Lab 12/22/17 1650 12/23/17 0136 12/23/17 0544  TROPONINI 0.35* 0.44* 0.48*    Glucose Recent Labs  Lab 12/16/17 1100 12/22/17 1610 12/22/17 1921 12/22/17 2322 12/23/17 0339 12/23/17 0727  GLUCAP 108* 84 81 95 77 74  Imaging Portable Chest Xray  Result Date: 12/23/2017 CLINICAL DATA:  Acute respiratory failure. EXAM: PORTABLE CHEST 1 VIEW COMPARISON:  12/22/2017.  06/22/2017. FINDINGS: Endotracheal tube and NG tube in stable position. Stable cardiomegaly. Persistent right upper lobe and bibasilar pulmonary infiltrates/edema again noted. Bilateral small pleural effusions, right side greater than left again noted. No pneumothorax. Chest is unchanged from prior exam. IMPRESSION: 1.  Lines and tubes in stable position. 2. Stable cardiomegaly. Persistent right upper lobe and bibasilar pulmonary infiltrates/edema again noted. Bilateral small pleural effusions, right side greater than left again noted. Electronically Signed   By: Maisie Fushomas  Register   On: 12/23/2017 05:57   Dg Chest Port 1 View  Result Date: 12/22/2017 CLINICAL DATA:  Acute respiratory failure. EXAM: PORTABLE CHEST 1 VIEW COMPARISON:  Chest x-ray from earlier same day and chest x-ray dated 06/22/2017. FINDINGS: Endotracheal tube is well positioned with tip approximately 3 cm above the carina. Enteric tube passes below the diaphragm. Stable cardiomegaly. Atherosclerotic changes noted at the aortic arch. Dense opacity persists in the RIGHT upper lobe, stable compared to today's earlier  exam, increased compared to yesterday's exam. Continued central pulmonary vascular congestion. Again noted is a probable layering pleural effusion at the RIGHT lung base. IMPRESSION: 1. Endotracheal tube is well positioned with tip approximately 3 cm above the carina. 2. Persistent dense opacity within the RIGHT upper lobe, presumably pneumonia or aspiration, stable compared to today's earlier chest x-ray but increased compared to the chest x-ray of 06/22/2017. 3. RIGHT pleural effusion, small to moderate in size, not significantly changed. 4. Stable cardiomegaly. 5. Aortic atherosclerosis. Electronically Signed   By: Bary RichardStan  Maynard M.D.   On: 12/22/2017 16:41   Dg Chest Port 1 View  Result Date: 12/22/2017 CLINICAL DATA:  Intubation EXAM: PORTABLE CHEST 1 VIEW COMPARISON:  Yesterday FINDINGS: Under penetrated study due to patient size and portable technique. New endotracheal tube with tip between the clavicular heads and carina. An orogastric tube reaches the stomach. Cardiomegaly with vascular congestion and probable layering pleural fluid. Increased airspace opacity in the right upper lobe. IMPRESSION: 1. New endotracheal and orogastric tubes in good position. 2. CHF pattern. 3. Increased airspace opacity in the right upper lobe, asymmetric edema versus superimposed pneumonia Electronically Signed   By: Marnee SpringJonathon  Watts M.D.   On: 12/22/2017 12:23     STUDIES:  CT Chest w/ Contrast 7/24>> Bronchoscopy 7/24>>  CULTURES: Blood x2 7/23>> NG2D MRSA PCR 7/23>> Negative Strep Pneumo urinary antigen 7/24>> Legionella urinary antigen 7/24>> Tracheal aspirate 7/24>> Urine culture 7/24>>  ANTIBIOTICS: Aztreonam 7/23>> Vancomycin 7/23>>  SIGNIFICANT EVENTS: 2018/04/24>> [facility]>> Intubated, hemoptysis, [facility]  LINES/TUBES: ETT 7/24>>  DISCUSSION: 84 y.o. Female with Acute on Chronic Hypoxic Respiratory failure requiring intubation in setting of CHF exacerbation vs  R PNA (aspiration) vs COPD exacerbation, and hemoptysis vs hematemesis  ASSESSMENT / PLAN:  PULMONARY A: Acute on Chronic Hypoxic Respiratory Failure Bilateral pleural effusions, RUL and bibasilar infiltrates Concern for aspiration PNA Hemoptysis -After bronch, seems more likely aspirating blood from possible GI bleed Hx: COPD, Asthma Iatrogenic alkalosis d/t increased RR on vent P:   PRVC: 8 cc/kg Wean FiO2 & Peep as tolerated to maintain O2 sats 88 to 92% VAP Bundle  Pulmonary Hygiene Follow intermittent CXR & ABG Follow up Chest CT Will cover empirically for aspiration PNA Decrease RR to treat alkalosis  CARDIOVASCULAR A:  Acute on Chronic CHF (diastolic) -BNP 1,977 Elevated Troponin, but flat Hx: CHF (diastolic), HTN Ventricular  tachycardia, intermittent, possibly due to alkalosis P:  ICU monitoring Maintain MAP >65 Obtain ECHO & EKG Continue Lasix Hold antihypertensives Decrease RR  RENAL A:   AKI Mildly hyperkalemic to 5.6 P:   Monitor I&O's / urinary output Trend BMP Ensure adequate renal perfusion Avoid nephrotoxic agents as able Replace electrolytes as indicated   GASTROINTESTINAL A:   Rule out GI Bleed - GI thinks bleeding likely esophagitis or traumatic intubation, will not plan on endoscopy, agrees with Protonix gtt P:   NPO Protonix gtt Appreciate GI input Fecal occult blood testing - in process  HEMATOLOGIC A:   Anemia Thrombocytopenia P:  Monitor for s/sx of bleeding Trend CBC SCD's for VTE prophylaxis (given hemoptysis) Transfuse for Hgb<7 Transfuse for Plt<50 and active bleeding   INFECTIOUS A:   Increased opacity RUL on CXR, possible aspiration PNA -No fevers or leukocytosis P:   Monitor fever curve Trend WBC & PCT Follow cultures Continue aztreonam and vancomycin  ENDOCRINE A:   Hx: DM II   P:   CBG's SSI Follow ICU hypo/hyperglycemia protocol  NEUROLOGIC A:   Sedation needs in setting of mechanical  ventilation Hx: Dementia P:   RASS goal: 0 to -1 Prn fentanyl and versed pushes maintain RASS goal Continue Aricept, Remeron   FAMILY  - Updates: No family at bedside 12/22/17 during NP rounds  - Inter-disciplinary family meet or Palliative Care meeting due by: 12/29/17   Harlen Labs. Frances Furbish, MD PGY-2, [facility] 12/23/2017 9:08 AM    12/23/2017, 8:45 AM

## 2017-12-23 NOTE — Progress Notes (Signed)
Initial Nutrition Assessment  DOCUMENTATION CODES:   Non-severe (moderate) malnutrition in context of chronic illness, Obesity unspecified  INTERVENTION:    Vital High Protein at 40 ml/h (960 ml per day)  Pro-stat 30 ml BID  Provides 1160 kcal, 114 gm protein, 803 ml free water daily  NUTRITION DIAGNOSIS:   Moderate Malnutrition related to chronic illness(CHF, COPD) as evidenced by mild fat depletion, mild muscle depletion.  GOAL:   Provide needs based on ASPEN/SCCM guidelines  MONITOR:   Vent status, TF tolerance, Weight trends, Labs, I & O's  REASON FOR ASSESSMENT:   Ventilator, Consult Enteral/tube feeding initiation and management  ASSESSMENT:   84 yo female with PMH of asthma, bronchitis, CHF, COPD, HTN, DM, renal disorder, and dementia who was admitted on 7/23 with CHF exacerbation vs PNA.   Discussed patient in ICU rounds and with RN today. GI consulted; no plans for scope. Received MD Consult for TF initiation and management. Patient is currently intubated on ventilator support MV: 7 L/min Temp (24hrs), Avg:99.9 F (37.7 C), Min:97.9 F (36.6 C), Max:100.6 F (38.1 C)  Propofol: none Labs reviewed. Potassium 5.6, BUN 58 (H), Creatinine 1.71 (H) Medications reviewed and include Novolog, Remeron, MVI. Weight has fluctuated over the past 6 months, from 158-197 lbs; suspect related to fluid status. Suspect current weight is elevated due to moderate edema.  NUTRITION - FOCUSED PHYSICAL EXAM:    Most Recent Value  Orbital Region  Mild depletion  Upper Arm Region  Mild depletion  Thoracic and Lumbar Region  No depletion  Buccal Region  Unable to assess  Temple Region  Mild depletion  Clavicle Bone Region  Mild depletion  Clavicle and Acromion Bone Region  Mild depletion  Scapular Bone Region  No depletion  Dorsal Hand  Unable to assess  Patellar Region  No depletion  Anterior Thigh Region  No depletion  Posterior Calf Region  No depletion  Edema (RD  Assessment)  Moderate  Hair  Reviewed  Eyes  Unable to assess  Mouth  Unable to assess  Skin  Reviewed  Nails  Unable to assess       Diet Order:   Diet Order           Diet NPO time specified  Diet effective now          EDUCATION NEEDS:   No education needs have been identified at this time  Skin:  Skin Assessment: Reviewed RN Assessment  Last BM:  7/23  Height:   Ht Readings from Last 1 Encounters:  12/22/17 5\' 4"  (1.626 m)    Weight:   Wt Readings from Last 1 Encounters:  12/23/17 193 lb 12.6 oz (87.9 kg)  7/24  183 lbs (BMI 31.5)  Ideal Body Weight:  54.5 kg  BMI:  Body mass index is 33.26 kg/m.  Estimated Nutritional Needs:   Kcal:  [phone]  Protein:  109 gm   Fluid:  1.5 L    Joaquin CourtsKimberly Georgana Romain, RD, LDN, CNSC Pager [phone] After Hours Pager [phone]

## 2017-12-23 NOTE — Clinical Social Work Note (Signed)
Clinical Social Work Assessment  Patient Details  Name: Brittney Li MRN: [id number] Date of Birth: 06/18/1929  Date of referral:  12/23/17               Reason for consult:  Facility Placement, Discharge Planning                Permission sought to share information with:    Permission granted to share information::  No  Name::     [facility]  Agency::  SNF  Relationship::    Contact Information:  Housing/Transportation Living arrangements for the past 2 months:  Apartment, Skilled Nursing Facility(pt lived at home alone and then [facility] few weeks ago. ) Source of Information:  ParamedicMedical Team(Social Worker from [facility] ) Patient Interpreter Needed:  None Criminal Activity/Legal Involvement Pertinent to Current Situation/Hospitalization:  No - Comment as needed Significant Relationships:  Other(Comment)(none identified at this time. ) Lives with:  Facility Resident Do you feel safe going back to the place where you live?  Yes Need for family participation in patient care:  Yes (Comment)  Care giving concerns:  CSW informed via last assessment that [facility] Per update placed in pt's chart by Lytle ButteHeather LCSW pt is expected to return to the facility at the time of discharge.    Social Worker assessment / plan:  CSW went to speak with pt at bedside. Pt remains intubated at this time and unable to speak with CSW. CSW examined pt's chart to gather further needed information to complete assessment. Per last assessment completed on 12/14/17 pt is from home [place] in [place]. [facility] and then admitted back to hospital. Per note placed in chart on 12/22/17 by [facility].   Employment status:  Retired Health and safety inspectornsurance information:  Medicare PT Recommendations:  Not assessed at this time Information / Referral to community resources:   Skilled Nursing Facility  Patient/Family's Response to care:  Pt remains intubated at this time therefore response is unable to be determined.   Patient/Family's Understanding of and Emotional Response to Diagnosis, Current Treatment, and Prognosis:  No emotional distress identified at this time.   Emotional Assessment Appearance:  Appears stated age Attitude/Demeanor/Rapport:  Unable to Assess Affect (typically observed):  Unable to Assess Orientation:  (pt intuabted at this time. ) Alcohol / Substance use:  Not Applicable Psych involvement (Current and /or in the community):  No (Comment)  Discharge Needs  Concerns to be addressed:  No discharge needs identified Readmission within the last 30 days:  Yes Current discharge risk:  Dependent with Mobility, Other(intubated. ) Barriers to Discharge:  Continued Medical Work up   Sempra EnergyKierra S Leandria Thier, LCSWA 12/23/2017, 7:33 AM

## 2017-12-23 NOTE — Progress Notes (Signed)
Pt transported to CT with no apparent complications.

## 2017-12-24 ENCOUNTER — Inpatient Hospital Stay (HOSPITAL_COMMUNITY): Payer: Medicare Other

## 2017-12-24 DIAGNOSIS — J90 Pleural effusion, not elsewhere classified: Secondary | ICD-10-CM

## 2017-12-24 LAB — BASIC METABOLIC PANEL
Anion gap: 11 (ref 5–15)
BUN: 63 mg/dL — AB (ref 8–23)
CHLORIDE: 103 mmol/L (ref 98–111)
CO2: 34 mmol/L — AB (ref 22–32)
CREATININE: 1.75 mg/dL — AB (ref 0.44–1.00)
Calcium: 8.7 mg/dL — ABNORMAL LOW (ref 8.9–10.3)
GFR calc Af Amer: 29 mL/min — ABNORMAL LOW (ref >60)
GFR calc non Af Amer: 25 mL/min — ABNORMAL LOW (ref >60)
Glucose, Bld: 112 mg/dL — ABNORMAL HIGH (ref 70–99)
Potassium: 3.8 mmol/L (ref 3.5–5.1)
Sodium: 148 mmol/L — ABNORMAL HIGH (ref 135–145)

## 2017-12-24 LAB — LACTATE DEHYDROGENASE, PLEURAL OR PERITONEAL FLUID: LD, Fluid: 118 U/L — ABNORMAL HIGH (ref 3–23)

## 2017-12-24 LAB — GLUCOSE, CAPILLARY
GLUCOSE-CAPILLARY: 88 mg/dL (ref 70–99)
GLUCOSE-CAPILLARY: 98 mg/dL (ref 70–99)
GLUCOSE-CAPILLARY: 109 mg/dL — AB (ref 70–99)
Glucose-Capillary: 88 mg/dL (ref 70–99)
Glucose-Capillary: 98 mg/dL (ref 70–99)
Glucose-Capillary: 106 mg/dL — ABNORMAL HIGH (ref 70–99)

## 2017-12-24 LAB — PROTEIN, PLEURAL OR PERITONEAL FLUID

## 2017-12-24 LAB — PROCALCITONIN: Procalcitonin: 0.21 ng/mL

## 2017-12-24 LAB — LACTATE DEHYDROGENASE: LDH: 213 U/L — ABNORMAL HIGH (ref 98–192)

## 2017-12-24 LAB — PHOSPHORUS
Phosphorus: 3.2 mg/dL (ref 2.5–4.6)
Phosphorus: 3.1 mg/dL (ref 2.5–4.6)

## 2017-12-24 LAB — CBC
HEMATOCRIT: 25.4 % — AB (ref 36.0–46.0)
Hemoglobin: 8.7 g/dL — ABNORMAL LOW (ref 12.0–15.0)
MCH: 29.6 pg (ref 26.0–34.0)
MCHC: 34.3 g/dL (ref 30.0–36.0)
MCV: 86.4 fL (ref 78.0–100.0)
Platelets: 94 10*3/uL — ABNORMAL LOW (ref 150–400)
RBC: 2.94 MIL/uL — ABNORMAL LOW (ref 3.87–5.11)
RDW: 13.9 % (ref 11.5–15.5)
WBC: 8.7 10*3/uL (ref 4.0–10.5)

## 2017-12-24 LAB — MAGNESIUM
MAGNESIUM: 2.3 mg/dL (ref 1.7–2.4)
Magnesium: 2.3 mg/dL (ref 1.7–2.4)

## 2017-12-24 LAB — PROTEIN, TOTAL: TOTAL PROTEIN: 5.3 g/dL — AB (ref 6.5–8.1)

## 2017-12-24 MED ORDER — SODIUM CHLORIDE 0.9 % IV SOLN
2.0000 g | INTRAVENOUS | Status: AC
  Administered 2017-12-24 – 2017-12-28 (×5): 2 g via INTRAVENOUS
  Filled 2017-12-24 (×5): qty 2

## 2017-12-24 MED ORDER — LACTATED RINGERS IV BOLUS
500.0000 mL | Freq: Once | INTRAVENOUS | Status: AC
  Administered 2017-12-24: 500 mL via INTRAVENOUS

## 2017-12-24 MED FILL — Medication: Qty: 1 | Status: AC

## 2017-12-24 NOTE — Progress Notes (Signed)
eLink Physician-Brief Progress Note Patient Name: Brittney HawsMary A Li DOB: 07/08/1929 MRN: [id number]   Date of Service  12/24/2017  HPI/Events of Note  Blood pressure in the low normal to low range. BMET suggestive of volume depletion.   eICU Interventions  Lactated Ringers 500 ml iv bolus x 1        Kobey Sides U Riyad Keena 12/24/2017, 8:46 PM

## 2017-12-24 NOTE — Plan of Care (Signed)
  Problem: Respiratory: Goal: Ability to maintain adequate ventilation will improve Outcome: Progressing  Pt tolerating breathing with the ventilator. Pt having moderate amounts of oral secretions, tolerating suction well.

## 2017-12-24 NOTE — Progress Notes (Addendum)
PULMONARY / CRITICAL CARE MEDICINE   Name: Brittney Li MRN: [id number] DOB: 1929/10/06    ADMISSION DATE:  12/26/2017 CONSULTATION DATE:  12/22/17  REFERRING MD:  Dr. Dyann Kief [facility])  CHIEF COMPLAINT:  Acute Hypoxic Respiratory Failure, Hemoptysis  HISTORY OF PRESENT ILLNESS:   Brittney Li is a 84 y.o. Female with a PMH of chronic respiratory failure requiring 3L home H8,NIDP,OEUMPN, Diastolic CHF, Dementia who presented to [facility] ED on 7/23 with c/o progressive Shortness of breath and cough for several days.  Initial workup at [facility] was concerning for CHF exacerbation and RUL PNA.  Pt was recently treated for CHF and discharged to nursing facility on 12/17/17. On 7/24 while at [facility], pt with hypoxia and suffered respiratory arrest in the setting of hematemesis vs hemoptysis, necessitating intubation.   Pt transferred to [facility] later in afternoon on 12/22/17 for further management of Acute on Chronic Hypoxic Respiratory failure requiring intubation and Hemoptysis. PCCM is asked to admit the pt.  PAST MEDICAL HISTORY :  She  has a past medical history of Arthritis, Asthma, Bronchitis, CHF (congestive heart failure) ([place]), COPD (chronic obstructive pulmonary disease) ([place]), Dementia, Diabetes mellitus without complication ([place]), Hyperkalemia, Hypertension, Renal disorder, and Respiratory failure ([place]).  PAST SURGICAL HISTORY: She  has a past surgical history that includes hands and Abdominal hysterectomy (1975).  Allergies  Allergen Reactions  . Erythromycin Other (See Comments)    Unknown   . Fluviral [Flu Virus Vaccine]   . Penicillins Other (See Comments)    .Has patient had a PCN reaction causing immediate rash, facial/tongue/throat swelling, SOB or lightheadedness with hypotension: Unknown Has patient had a PCN reaction causing severe rash involving mucus membranes or skin necrosis:No Has patient had a PCN reaction that required hospitalization: Yes Has patient had  a PCN reaction occurring within the last 10 years: No If all of the above answers are "NO", then may proceed with Cephalosporin use.     No current facility-administered medications on file prior to encounter.    Current Outpatient Medications on File Prior to Encounter  Medication Sig  . albuterol (PROVENTIL) (2.5 MG/3ML) 0.083% nebulizer solution Take 2.5 mg by nebulization every 6 (six) hours as needed for wheezing.  Marland Kitchen aspirin 81 MG tablet Take 81 mg by mouth daily.    Marland Kitchen atorvastatin (LIPITOR) 20 MG tablet Take 20 mg by mouth daily at 6 PM.  . dextromethorphan (DELSYM) 30 MG/5ML liquid Take 5 mLs (30 mg total) by mouth 2 (two) times daily as needed for cough.  . docusate sodium (COLACE) 100 MG capsule Take 100 mg by mouth daily as needed for mild constipation or moderate constipation.   Marland Kitchen donepezil (ARICEPT) 10 MG tablet Take 10 mg by mouth at bedtime.  . famotidine (PEPCID) 20 MG tablet Take 20 mg by mouth daily.  . mirtazapine (REMERON) 30 MG tablet Take 30 mg by mouth at bedtime.  . mometasone-formoterol (DULERA) 200-5 MCG/ACT AERO Inhale 2 puffs into the lungs 2 (two) times daily.  . Multiple Vitamin (MULTIVITAMIN WITH MINERALS) TABS tablet Take 1 tablet by mouth daily.  Marland Kitchen nystatin (MYCOSTATIN) 100000 UNIT/ML suspension Use as directed 5 mLs in the mouth or throat 4 (four) times daily. 7 DAY COURSE TO END ON 12/27/2017  . oxybutynin (DITROPAN-XL) 5 MG 24 hr tablet Take 5 mg by mouth every evening.  . potassium chloride SA (K-DUR,KLOR-CON) 20 MEQ tablet Take 2 tablets (40 mEq total) by mouth daily.  Marland Kitchen torsemide (DEMADEX) 20 MG  tablet TAKE 40 MG twice daily (Patient taking differently: Take 60 mg by mouth 2 (two) times daily. )  . traMADol (ULTRAM) 50 MG tablet Take 1 tablet (50 mg total) by mouth 2 (two) times daily for 3 days.    FAMILY HISTORY:  Her family history includes Arthritis in her unknown relative; Asthma in her unknown relative; Diabetes in her unknown relative.  SOCIAL  HISTORY: She  reports that she has quit smoking. She has never used smokeless tobacco. She reports that she does not drink alcohol or use drugs.  REVIEW OF SYSTEMS:   Unable to obtain due to intubation  SUBJECTIVE:  On ventilator Not on pressors or sedation  VITAL SIGNS: BP 101/81   Pulse 74   Temp 99.9 F (37.7 C)   Resp 15   Ht 5\' 4"  (1.626 m)   Wt 189 lb 9.5 oz (86 kg)   SpO2 100%   BMI 32.54 kg/m   HEMODYNAMICS:    VENTILATOR SETTINGS: Vent Mode: PRVC FiO2 (%):  [50 %] 50 % Set Rate:  [14 bmp] 14 bmp Vt Set:  [400 mL] 400 mL PEEP:  [10 cmH20] 10 cmH20 Plateau Pressure:  [18 cmH20-23 cmH20] 21 cmH20  INTAKE / OUTPUT: I/O last 3 completed shifts: In: 4124.7 [I.V.:1842.5; NG/GT:239.5; IV Piggyback:2042.8] Out: 1865 [Urine:1865]  PHYSICAL EXAMINATION: General:  Acute on chronically ill appearing female, intubated, alert, in no acute distress Neuro:  Awake and alert, follows commands, no focal deficits Cardiovascular:  RRR, s1s2, no MRG Lungs:  Less air movement on L, clear on R, comfortable WOB Abdomen:  Soft, BS+ x4, mildly tender to palpation Musculoskeletal:  No deformities, 2+ bilateral LE edema Skin:  No obvious rashes, lesions, or ulcerations  LABS:  BMET Recent Labs  Lab 12/23/17 0136 12/23/17 1136 12/24/17 0320  NA 145 149* 148*  K 5.6* 4.0 3.8  CL 100 103 103  CO2 33* 37* 34*  BUN 58* 56* 63*  CREATININE 1.71* 1.75* 1.75*  GLUCOSE 102* 88 112*    Electrolytes Recent Labs  Lab 12/23/17 0136 12/23/17 0544 12/23/17 1136 12/23/17 1703 12/24/17 0320  CALCIUM 8.2*  --  9.0  --  8.7*  MG 2.2  --   --  2.2 2.3  PHOS  --  3.1  --  3.1 3.2    CBC Recent Labs  Lab 12/22/17 1650 12/23/17 0136  12/23/17 1136 12/23/17 1703 12/24/17 0320  WBC 11.0* 8.4  --   --   --  8.7  HGB 9.8* 8.6*   < > 8.5* 8.4* 8.7*  HCT 28.2* 25.7*   < > 25.5* 25.2* 25.4*  PLT 100* 137*  --   --   --  94*   < > = values in this interval not displayed.     Coag's Recent Labs  Lab 12/22/17 1620  APTT 28  INR 1.30    Sepsis Markers Recent Labs  Lab 2018/01/19 1556 12/22/17 1620 12/23/17 0136 12/24/17 0320  LATICACIDVEN 1.66  --   --   --   PROCALCITON  --  <0.10 0.14 0.21    ABG Recent Labs  Lab 12/22/17 1627 12/23/17 0331 12/23/17 1235  PHART 7.506* 7.559* 7.483*  PCO2ART 51.0* 44.2 52.4*  PO2ART 219.0* 71.0* 93.9    Liver Enzymes Recent Labs  Lab 19-Jan-2018 1543  AST 40  ALT 30  ALKPHOS 84  BILITOT 0.8  ALBUMIN 3.2*    Cardiac Enzymes Recent Labs  Lab 12/22/17 1650 12/23/17 0136 12/23/17 0544  TROPONINI 0.35* 0.44* 0.48*    Glucose Recent Labs  Lab 12/23/17 1137 12/23/17 1516 12/23/17 1934 12/23/17 2335 12/24/17 0400 12/24/17 0810  GLUCAP 79 89 81 85 88 88    Imaging Ct Chest Wo Contrast  Result Date: 12/23/2017 CLINICAL DATA:  84 year old female with hemoptysis since yesterday. History of chronic lung disease requiring home oxygen. Patient presented on April 09, 2018 with progressive shortness of breath and cough. Respiratory arrest while an inpatient with aspirated hematemesis versus hemoptysis, intubated. Status post bronchoscopy yesterday. EXAM: CT CHEST WITHOUT CONTRAST TECHNIQUE: Multidetector CT imaging of the chest was performed following the standard protocol without IV contrast. COMPARISON:  Portable chest 0519 hours today and earlier. Prior chest CTA 10/03/2015. FINDINGS: Cardiovascular: Calcified aortic and coronary atherosclerosis. Vascular patency is not evaluated in the absence of IV contrast. Stable cardiomegaly. No pericardial effusion. Mediastinum/Nodes: Enteric tube courses through the esophagus to the stomach. No mediastinal lymphadenopathy is evident. Lungs/Pleura: Endotracheal tube in place, tip in good position above the carina. There is layering fluid in the right mainstem bronchus (series 8, image 71). There is partial opacification of the right lower lobe superior segment bronchus  and posterior segmental lower lobe bronchus on image 94. Otherwise the major airways are patent. Small layering left and small to moderate layering right pleural effusions, layering fluid up to 3.5 centimeters deep on the right side. Associated compressive atelectasis. Superimposed chronic lung disease with centrilobular emphysema. Superimposed right upper lobe consolidation with either intervening emphysema or cavitary changes of the lung (series 8, image 47). The more posterior aspect of the right lower lobe and the superior segment of the right lower lobe then are densely opacified with no air bronchograms evident (series 4, image 63). Mild superimposed dependent atelectasis in both lungs. Upper Abdomen: The enteric tube loops in the proximal stomach. The visible stomach is decompressed. Otherwise grossly stable visible upper abdominal viscera since 2017. Musculoskeletal: No acute osseous abnormality identified. IMPRESSION: 1. Abnormal right upper lobe and superior segment right lower lobe with consolidation versus drowned lung. Cavitation versus underlying emphysema within the right upper lobe component. Top differential considerations include pneumonia, aspiration, and pulmonary hemorrhage. 2. Superimposed right greater than left layering pleural effusions and underlying chronic lung disease with Emphysema (ICD10-J43.9). 3. Endotracheal tube tip is in good position. Enteric tube looped in the proximal stomach which appears decompressed. 4.  Aortic Atherosclerosis (ICD10-I70.0). Electronically Signed   By: Odessa FlemingH  Hall M.D.   On: 12/23/2017 12:28     STUDIES:  CT Chest w/ Contrast 7/24>> Bronchoscopy 7/24>>  CULTURES: Blood x2 7/23>> NG3D MRSA PCR 7/23>> Negative Strep Pneumo urinary antigen 7/24>> negative Legionella urinary antigen 7/24>> in process Tracheal aspirate 7/24>> no organisms seen Urine culture 7/24>> no growth  ANTIBIOTICS: Aztreonam 7/23>>7/26 Vancomycin 7/23>>7/25 Ceftazidime 7/26  >>  SIGNIFICANT EVENTS: 09/15/17>> [facility] Intubated, hemoptysis, [facility] >> CT shows pneumonia, 20-beat run of ventricular tachycardia, GI consulted and recommended PPI, no endoscopy  LINES/TUBES: ETT 7/24>>  DISCUSSION: 84 y.o. Female with Acute on Chronic Hypoxic Respiratory failure requiring intubation in setting of CHF exacerbation vs R PNA (aspiration) vs COPD exacerbation, and hemoptysis vs hematemesis  ASSESSMENT / PLAN:  PULMONARY A: Acute on Chronic Hypoxic Respiratory Failure Bilateral pleural effusions, RUL and bibasilar infiltrates Concern for aspiration PNA Hemoptysis Hx: COPD, Asthma P:   PRVC: 8 cc/kg Wean FiO2 & Peep as tolerated to maintain O2 sats 88 to 92% VAP Bundle  Pulmonary Hygiene Follow intermittent CXR & ABG Continue  abx as described below Decrease rate to 12 bpm Decrease PEEP [place] chest to check for pleural effusion  CARDIOVASCULAR A:  Acute on Chronic CHF (diastolic) -BNP 1,977 Elevated Troponin, but flat Hx: CHF (diastolic), HTN Ventricular tachycardia, intermittent, possibly due to alkalosis Echo with EF 55-60% G2DD P:  ICU monitoring Maintain MAP >65 Hold antihypertensives  RENAL A:   AKI Mildly hyperkalemic, now resolved Good UOP P:   Monitor I&O's / urinary output Trend BMP Ensure adequate renal perfusion Avoid nephrotoxic agents as able Replace electrolytes as indicated   GASTROINTESTINAL A:   Rule out GI Bleed - GI thinks bleeding likely esophagitis or traumatic intubation, will not plan on endoscopy, agrees with Protonix gtt P:   NPO Stop protonix gtt Protonix IV 40 mg BID Appreciate GI input Fecal occult blood testing - in process  HEMATOLOGIC A:   Anemia, stable Thrombocytopenia to 94 on 7/26 P:  Monitor for s/sx of bleeding Trend CBC SCD's for VTE prophylaxis (given hemoptysis) Transfuse for Hgb<7 Transfuse for Plt<50 and active bleeding   INFECTIOUS A:    Increased opacity RUL on CXR, possible aspiration PNA -No fevers or leukocytosis P:   Monitor fever curve Trend WBC & PCT Follow cultures Start ceftazidime for better aspiration PNA coverage Stop vancomycin and aztreonam  ENDOCRINE A:   Hx: DM II   P:   CBG's SSI Follow ICU hypo/hyperglycemia protocol  NEUROLOGIC A:   Sedation needs in setting of mechanical ventilation Hx: Dementia P:   RASS goal: 0 to -1 Prn fentanyl and versed pushes maintain RASS goal Continue Aricept, Remeron   FAMILY  - Updates: No family at bedside 12/22/17 during NP rounds  - Inter-disciplinary family meet or Palliative Care meeting due by: 12/29/17   Harlen Labs. Frances Furbish, MD PGY-2, [facility] 12/24/2017 8:20 AM    12/24/2017, 8:20 AM

## 2017-12-24 NOTE — Progress Notes (Addendum)
[facility] having some fluctuating hypotension (MAP<60). Pt does not appear symptomatic at this time. Orders received for 500mL bolus of LR. BP has stabilized, RN [facility] about holding bolus at this time since pt has dCHF. [facility] also notified of pt having scant vaginal bleeding. Will continue to monitor. Caswell CorwinDana C Ayiden Milliman, RN 12/24/17 8:42 PM

## 2017-12-24 NOTE — Progress Notes (Signed)
PT Cancellation Note  Patient Details Name: Brittney Li MRN: [id number] DOB: 11/29/1929   Cancelled Treatment:    Reason Eval/Treat Not Completed: Patient at procedure or test/unavailable. Per RN, going for thoracentesis this afternoon. Will follow-up for PT treatment as schedule permits.  Brittney Li, PT, DPT Acute Rehab Services  Pager: [phone]  Brittney Li 12/24/2017, 12:28 PM

## 2017-12-24 NOTE — Procedures (Signed)
Thoracentesis Procedure Note  Pre-operative Diagnosis: right pleural effusion   Post-operative Diagnosis: same  Indications: evaluation of pleural fluid and treatment of dyspnea   Procedure Details  Consent: Informed consent was obtained. Risks of the procedure were discussed including: infection, bleeding, pain, pneumothorax.  Under sterile conditions the patient was positioned. Betadine solution and sterile drapes were utilized.  1% buffered lidocaine was used to anesthetize the  rib space which was identified via real time US. Fluid was obtained without any difficulties and minimal blood loss.  A dressing was applied to the wound and wound care instructions were provided.   Findings 500 ml of clear pleural fluid was obtained. A sample was sent to Pathology for cytogenetics, flow, and cell counts, as well as for infection analysis.  Complications:  None; patient tolerated the procedure well.          Condition: stable   Simonne MartinetPeter E Chivon Lepage ACNP-BC [facility] Pulmonary/Critical Care Pager # [phone] OR # [phone] if no answer

## 2017-12-25 ENCOUNTER — Inpatient Hospital Stay (HOSPITAL_COMMUNITY): Payer: Medicare Other

## 2017-12-25 ENCOUNTER — Inpatient Hospital Stay: Payer: Self-pay

## 2017-12-25 LAB — BASIC METABOLIC PANEL
ANION GAP: 9 (ref 5–15)
BUN: 62 mg/dL — ABNORMAL HIGH (ref 8–23)
CHLORIDE: 105 mmol/L (ref 98–111)
CO2: 36 mmol/L — AB (ref 22–32)
CREATININE: 1.54 mg/dL — AB (ref 0.44–1.00)
Calcium: 8.5 mg/dL — ABNORMAL LOW (ref 8.9–10.3)
GFR calc Af Amer: 34 mL/min — ABNORMAL LOW (ref >60)
GFR calc non Af Amer: 29 mL/min — ABNORMAL LOW (ref >60)
Glucose, Bld: 130 mg/dL — ABNORMAL HIGH (ref 70–99)
POTASSIUM: 3.5 mmol/L (ref 3.5–5.1)
SODIUM: 150 mmol/L — AB (ref 135–145)

## 2017-12-25 LAB — GLUCOSE, CAPILLARY
GLUCOSE-CAPILLARY: 111 mg/dL — AB (ref 70–99)
GLUCOSE-CAPILLARY: 118 mg/dL — AB (ref 70–99)
Glucose-Capillary: 138 mg/dL — ABNORMAL HIGH (ref 70–99)
Glucose-Capillary: 136 mg/dL — ABNORMAL HIGH (ref 70–99)
Glucose-Capillary: 127 mg/dL — ABNORMAL HIGH (ref 70–99)

## 2017-12-25 LAB — CULTURE, BAL-QUANTITATIVE W GRAM STAIN

## 2017-12-25 LAB — CULTURE, BAL-QUANTITATIVE
CULTURE: NORMAL — AB
CULTURE: NORMAL — AB

## 2017-12-25 LAB — CBC
HCT: 25 % — ABNORMAL LOW (ref 36.0–46.0)
HEMOGLOBIN: 8.3 g/dL — AB (ref 12.0–15.0)
MCH: 29.4 pg (ref 26.0–34.0)
MCHC: 33.2 g/dL (ref 30.0–36.0)
MCV: 88.7 fL (ref 78.0–100.0)
PLATELETS: 92 10*3/uL — AB (ref 150–400)
RBC: 2.82 MIL/uL — ABNORMAL LOW (ref 3.87–5.11)
RDW: 14.2 % (ref 11.5–15.5)
WBC: 8.2 10*3/uL (ref 4.0–10.5)

## 2017-12-25 LAB — LEGIONELLA PNEUMOPHILA SEROGP 1 UR AG: L. PNEUMOPHILA SEROGP 1 UR AG: NEGATIVE

## 2017-12-25 LAB — PHOSPHORUS: Phosphorus: 3.1 mg/dL (ref 2.5–4.6)

## 2017-12-25 LAB — MAGNESIUM: Magnesium: 2.3 mg/dL (ref 1.7–2.4)

## 2017-12-25 MED ORDER — POTASSIUM CHLORIDE 20 MEQ/15ML (10%) PO SOLN
40.0000 meq | Freq: Once | ORAL | Status: AC
  Administered 2017-12-25: 40 meq
  Filled 2017-12-25: qty 30

## 2017-12-25 MED ORDER — ADULT MULTIVITAMIN LIQUID CH
15.0000 mL | Freq: Every day | ORAL | Status: DC
  Administered 2017-12-25 – 2017-12-29 (×5): 15 mL
  Filled 2017-12-25 (×6): qty 15

## 2017-12-25 MED ORDER — SODIUM CHLORIDE 0.9% FLUSH: 10.0000 mL | INTRAVENOUS | Status: DC | PRN

## 2017-12-25 MED ORDER — HEPARIN SODIUM (PORCINE) 5000 UNIT/ML IJ SOLN
5000.0000 [IU] | Freq: Three times a day (TID) | INTRAMUSCULAR | Status: DC
  Administered 2017-12-25: 5000 [IU] via SUBCUTANEOUS
  Filled 2017-12-25 (×2): qty 1

## 2017-12-25 MED ORDER — CHLORHEXIDINE GLUCONATE CLOTH 2 % EX PADS
6.0000 | MEDICATED_PAD | Freq: Every day | CUTANEOUS | Status: DC
  Administered 2017-12-25 – 2017-12-30 (×6): 6 via TOPICAL

## 2017-12-25 MED ORDER — FUROSEMIDE 10 MG/ML IJ SOLN
40.0000 mg | Freq: Once | INTRAMUSCULAR | Status: AC
  Administered 2017-12-25: 40 mg via INTRAVENOUS
  Filled 2017-12-25: qty 4

## 2017-12-25 MED ORDER — HEPARIN SODIUM (PORCINE) 5000 UNIT/ML IJ SOLN
5000.0000 [IU] | Freq: Three times a day (TID) | INTRAMUSCULAR | Status: DC
  Administered 2017-12-25 – 2017-12-29 (×8): 5000 [IU] via SUBCUTANEOUS
  Filled 2017-12-25 (×10): qty 1

## 2017-12-25 MED ORDER — FREE WATER
200.0000 mL | Freq: Four times a day (QID) | Status: DC
  Administered 2017-12-25 – 2017-12-26 (×4): 200 mL

## 2017-12-25 MED ORDER — SODIUM CHLORIDE 0.9% FLUSH
10.0000 mL | Freq: Two times a day (BID) | INTRAVENOUS | Status: DC
  Administered 2017-12-25 – 2018-01-02 (×17): 10 mL

## 2017-12-25 NOTE — Progress Notes (Signed)
Pt had a 10 beat run of wide complex tachycardia with transient hypotension. [facility] notified. Will continue to monitor.

## 2017-12-25 NOTE — Progress Notes (Signed)
Peripherally Inserted Central Catheter/Midline Placement  The IV Nurse has discussed with the patient and/or persons authorized to consent for the patient, the purpose of this procedure and the potential benefits and risks involved with this procedure.  The benefits include less needle sticks, lab draws from the catheter, and the patient may be discharged home with the catheter. Risks include, but not limited to, infection, bleeding, blood clot (thrombus formation), and puncture of an artery; nerve damage and irregular heartbeat and possibility to perform a PICC exchange if needed/ordered by physician.  Alternatives to this procedure were also discussed.  Bard Power PICC patient education guide, fact sheet on infection prevention and patient information card has been provided to patient /or left at bedside.    PICC/Midline Placement Documentation  PICC Double Lumen 12/25/17 PICC Right Brachial 40 cm 0 cm (Active)  Indication for Insertion or Continuance of Line Prolonged intravenous therapies 12/25/2017  1:38 PM  Exposed Catheter (cm) 0 cm 12/25/2017  1:38 PM  Site Assessment Clean;Dry;Intact 12/25/2017  1:38 PM  Lumen #1 Status Flushed;Blood return noted 12/25/2017  1:38 PM  Lumen #2 Status Flushed;Blood return noted 12/25/2017  1:38 PM  Dressing Type Transparent 12/25/2017  1:38 PM  Dressing Status Clean;Dry;Intact;Antimicrobial disc in place 12/25/2017  1:38 PM  Dressing Intervention New dressing 12/25/2017  1:38 PM  Dressing Change Due 01/01/18 12/25/2017  1:38 PM    Verbal consent signed by 2 RN due to intubation  Maximino GreenlandLumban, Rudra Hobbins Albarece 12/25/2017, 1:39 PM

## 2017-12-25 NOTE — Progress Notes (Signed)
eLink Physician-Brief Progress Note Patient Name: Brittney HawsMary A Li DOB: 04/23/1930 MRN: [id number]   Date of Service  12/25/2017  HPI/Events of Note  Pt had a short run of wide complex tachycardia without hemodynamic compromise.  eICU Interventions  Will check electrolytes        Harlee Pursifull U Roxi Hlavaty 12/25/2017, 2:33 AM

## 2017-12-25 NOTE — Progress Notes (Addendum)
PULMONARY / CRITICAL CARE MEDICINE   Name: Brittney Li MRN: [id number] DOB: 1930/02/19    ADMISSION DATE:  12/22/2017 CONSULTATION DATE:  12/22/17  REFERRING MD:  Dr. Dyann Kief [facility])  CHIEF COMPLAINT:  Acute Hypoxic Respiratory Failure, Hemoptysis  HISTORY OF PRESENT ILLNESS:   Brittney Li is a 84 y.o. Female with a PMH of chronic respiratory failure requiring 3L home C9,OBSJ,GGEZMO, Diastolic CHF, Dementia who presented to [facility] ED on 7/23 with c/o progressive Shortness of breath and cough for several days.  Initial workup at [facility] was concerning for CHF exacerbation and RUL PNA.  Pt was recently treated for CHF and discharged to nursing facility on 12/17/17. On 7/24 while at [facility], pt with hypoxia and suffered respiratory arrest in the setting of hematemesis vs hemoptysis, necessitating intubation.   Pt transferred to [facility] later in afternoon on 12/22/17 for further management of Acute on Chronic Hypoxic Respiratory failure requiring intubation and Hemoptysis. PCCM is asked to admit the pt.  PAST MEDICAL HISTORY :  She  has a past medical history of Arthritis, Asthma, Bronchitis, CHF (congestive heart failure) ([place]), COPD (chronic obstructive pulmonary disease) ([place]), Dementia, Diabetes mellitus without complication ([place]), Hyperkalemia, Hypertension, Renal disorder, and Respiratory failure ([place]).  PAST SURGICAL HISTORY: She  has a past surgical history that includes hands and Abdominal hysterectomy (1975).  Allergies  Allergen Reactions  . Erythromycin Other (See Comments)    Unknown   . Fluviral [Flu Virus Vaccine]   . Penicillins Other (See Comments)    .Has patient had a PCN reaction causing immediate rash, facial/tongue/throat swelling, SOB or lightheadedness with hypotension: Unknown Has patient had a PCN reaction causing severe rash involving mucus membranes or skin necrosis:No Has patient had a PCN reaction that required hospitalization: Yes Has patient had  a PCN reaction occurring within the last 10 years: No If all of the above answers are "NO", then may proceed with Cephalosporin use.     No current facility-administered medications on file prior to encounter.    Current Outpatient Medications on File Prior to Encounter  Medication Sig  . albuterol (PROVENTIL) (2.5 MG/3ML) 0.083% nebulizer solution Take 2.5 mg by nebulization every 6 (six) hours as needed for wheezing.  Marland Kitchen aspirin 81 MG tablet Take 81 mg by mouth daily.    Marland Kitchen atorvastatin (LIPITOR) 20 MG tablet Take 20 mg by mouth daily at 6 PM.  . dextromethorphan (DELSYM) 30 MG/5ML liquid Take 5 mLs (30 mg total) by mouth 2 (two) times daily as needed for cough.  . docusate sodium (COLACE) 100 MG capsule Take 100 mg by mouth daily as needed for mild constipation or moderate constipation.   Marland Kitchen donepezil (ARICEPT) 10 MG tablet Take 10 mg by mouth at bedtime.  . famotidine (PEPCID) 20 MG tablet Take 20 mg by mouth daily.  . mirtazapine (REMERON) 30 MG tablet Take 30 mg by mouth at bedtime.  . mometasone-formoterol (DULERA) 200-5 MCG/ACT AERO Inhale 2 puffs into the lungs 2 (two) times daily.  . Multiple Vitamin (MULTIVITAMIN WITH MINERALS) TABS tablet Take 1 tablet by mouth daily.  Marland Kitchen nystatin (MYCOSTATIN) 100000 UNIT/ML suspension Use as directed 5 mLs in the mouth or throat 4 (four) times daily. 7 DAY COURSE TO END ON 12/27/2017  . oxybutynin (DITROPAN-XL) 5 MG 24 hr tablet Take 5 mg by mouth every evening.  . potassium chloride SA (K-DUR,KLOR-CON) 20 MEQ tablet Take 2 tablets (40 mEq total) by mouth daily.  Marland Kitchen torsemide (DEMADEX) 20 MG  tablet TAKE 40 MG twice daily (Patient taking differently: Take 60 mg by mouth 2 (two) times daily. )    FAMILY HISTORY:  Her family history includes Arthritis in her unknown relative; Asthma in her unknown relative; Diabetes in her unknown relative.  SOCIAL HISTORY: She  reports that she has quit smoking. She has never used smokeless tobacco. She reports that  she does not drink alcohol or use drugs.  REVIEW OF SYSTEMS:   Unable to obtain due to intubation  SUBJECTIVE:  Underwent thoracentesis yesterday.  Run of NSVT overnight, BP remained stable  VITAL SIGNS: BP (!) 105/57   Pulse 75   Temp (!) 100.4 F (38 C)   Resp (!) 23   Ht _0  (1.626 m)   Wt 182 lb 5.1 oz (82.7 kg)   SpO2 97%   BMI 31.30 kg/m   HEMODYNAMICS:    VENTILATOR SETTINGS: Vent Mode: PRVC FiO2 (%):  [40 %-50 %] 40 % Set Rate:  [14 bmp] 14 bmp Vt Set:  [400 mL] 400 mL PEEP:  [10 cmH20] 10 cmH20 Plateau Pressure:  [15 cmH20-25 cmH20] 20 cmH20  INTAKE / OUTPUT: I/O last 3 completed shifts: In: 2782.5 [P.O.:120; I.V.:947.9; NG/GT:717.5; IV Piggyback:997.1] Out: 2165 [Urine:2165]  PHYSICAL EXAMINATION: General:  Acute on chronically ill appearing female, intubated, alert, in no acute distress Neuro:  Awake and alert, follows commands, no focal deficits Cardiovascular:  RRR, s1s2, no MRG Lungs:  Coarse on right, clear on left, no wheezing, even, some gurgling Abdomen:  Soft, BS+ x4, mildly tender to palpation Musculoskeletal:  No deformities, 2+ bilateral LE edema Skin:  No obvious rashes, lesions, or ulcerations  LABS:  BMET Recent Labs  Lab 12/23/17 1136 12/24/17 0320 12/25/17 0245  NA 149* 148* 150*  K 4.0 3.8 3.5  CL 103 103 105  CO2 37* 34* 36*  BUN 56* 63* 62*  CREATININE 1.75* 1.75* 1.54*  GLUCOSE 88 112* 130*    Electrolytes Recent Labs  Lab 12/23/17 1136  12/24/17 0320 12/24/17 1653 12/25/17 0245  CALCIUM 9.0  --  8.7*  --  8.5*  MG  --    < > 2.3 2.3 2.3  PHOS  --    < > 3.2 3.1 3.1   < > = values in this interval not displayed.    CBC Recent Labs  Lab 12/23/17 0136  12/23/17 1703 12/24/17 0320 12/25/17 0245  WBC 8.4  --   --  8.7 8.2  HGB 8.6*   < > 8.4* 8.7* 8.3*  HCT 25.7*   < > 25.2* 25.4* 25.0*  PLT 137*  --   --  94* 92*   < > = values in this interval not displayed.    Coag's Recent Labs  Lab  12/22/17 1620  APTT 28  INR 1.30    Sepsis Markers Recent Labs  Lab 12/20/2017 1556 12/22/17 1620 12/23/17 0136 12/24/17 0320  LATICACIDVEN 1.66  --   --   --   PROCALCITON  --  <0.10 0.14 0.21    ABG Recent Labs  Lab 12/22/17 1627 12/23/17 0331 12/23/17 1235  PHART 7.506* 7.559* 7.483*  PCO2ART 51.0* 44.2 52.4*  PO2ART 219.0* 71.0* 93.9    Liver Enzymes Recent Labs  Lab 12/28/2017 1543  AST 40  ALT 30  ALKPHOS 84  BILITOT 0.8  ALBUMIN 3.2*    Cardiac Enzymes Recent Labs  Lab 12/22/17 1650 12/23/17 0136 12/23/17 0544  TROPONINI 0.35* 0.44* 0.48*    Glucose Recent  Labs  Lab 12/24/17 1138 12/24/17 1521 12/24/17 1925 12/24/17 2329 12/25/17 0322 12/25/17 0805  GLUCAP 98 98 109* 106* 111* 138*    Imaging Dg Chest Portable 1 View  Result Date: 12/24/2017 CLINICAL DATA:  84 year old female with a history of thoracentesis EXAM: PORTABLE CHEST 1 VIEW COMPARISON:  CT 12/23/2017, chest x-ray 12/24/2017, 12/22/2017 FINDINGS: Cardiomediastinal silhouette unchanged. Endotracheal tube unchanged, terminating 4.4 cm above the carina. Gastric tube unchanged terminating in the left upper quadrant. Similar appearance of mixed interstitial and airspace opacities of the bilateral lungs with right upper lobe consolidation. Small bilateral pleural effusions, improved on the right. No visualized pneumothorax. IMPRESSION: No visualized pneumothorax in this patient status post thoracentesis. Small persisting bilateral pleural effusions. Persisting mixed interstitial and airspace disease, including right upper lobe consolidation. Unchanged endotracheal tube and gastric tube. Electronically Signed   By: Corrie Mckusick D.O.   On: 12/24/2017 16:22   [place] Ekg Site Rite  Result Date: 12/25/2017 If Site Rite image not attached, placement could not be confirmed due to current cardiac rhythm.    STUDIES:  CT Chest w/ Contrast 7/24>> Bronchoscopy 7/24>>  CULTURES: Blood x2 7/23>>  NG2D MRSA PCR 7/23>> Negative Strep Pneumo urinary antigen 7/24>> negative Legionella urinary antigen 7/24>> BAL 7/24>> Urine culture 7/24>>  ANTIBIOTICS: Aztreonam 7/23>> 7/26 Vancomycin 7/23>> 7/26 Cetaz 7/26 >>  SIGNIFICANT EVENTS: 12/03/2017>> Presented to [facility] 12/22/17>> Intubated, hemoptysis, transfer to [facility]  LINES/TUBES: ETT 7/24>> Rt radial A line 7/24 >   DISCUSSION: 84 y.o. Female with Acute on Chronic Hypoxic Respiratory failure requiring intubation in setting of CHF exacerbation vs R PNA (aspiration) vs COPD exacerbation, and hemoptysis vs hematemesis  ASSESSMENT / PLAN:  PULMONARY A: Acute on Chronic Hypoxic Respiratory Failure Bilateral pleural effusions, RUL and bibasilar infiltrates Concern for aspiration PNA Hemoptysis Bronch with no active bleed Hx: COPD, Asthma Iatrogenic alkalosis d/t increased RR on vent P:   Continue vent support Wean PEEP, FiO2 Follow chest x-ray Reviewed results of right thoracentesis which shows transudate.  No evidence of empyema.  CARDIOVASCULAR A:  Acute on Chronic CHF (diastolic) Elevated Troponin, but flat Hx: CHF (diastolic), HTN Ventricular tachycardia, intermittent, possibly due to alkalosis Echo noted with grade 2 diastolic dysfunction, high ventricular filling pressure, pulm HTN BNP 1,977 P:  Lasix 40 mg IV. Cannot be aggressive as she has soft BP PICC line placement for access. Check CVP after line placement  RENAL A:   AKI P:   Follow urine output and Cr Start free water for increased Na  GASTROINTESTINAL A:   Rule out GI Bleed - GI thinks bleeding likely esophagitis or traumatic intubation, will not plan on endoscopy, agrees with PPI P:   Tube feeds PPI  HEMATOLOGIC A:   Anemia Thrombocytopenia P:  Monitor for s/sx of bleeding Trend CBC SCD's for VTE prophylaxis (given hemoptysis) Transfuse for Hgb<7 Transfuse for Plt<50 and active bleeding  INFECTIOUS A:   Increased opacity RUL on  CXR, possible aspiration PNA -No fevers or leukocytosis P:   Abx narrowed to ceftax. Follow cultures  ENDOCRINE A:   Hx: DM II   P:   SSI coverage Follow ICU hypo/hyperglycemia protocol  NEUROLOGIC A:   Sedation needs in setting of mechanical ventilation Hx: Dementia P:   RASS goal: 0 to -1 Prn fentanyl and versed pushes maintain RASS goal Continue Aricept, Remeron  FAMILY  - Updates: No family at bedside - Inter-disciplinary family meet or Palliative Care meeting due by: 12/29/17  BEST PRACTICE / DISPOSITION  Level of Care:  ICU Primary Service:  PCCM Consultants: GI  Code Status:  Full Diet:  Tube feeds DVT Px:  Start Hep SQ, SCDs GI Px:  PPI bid. Change to qd Skin Integrity:  Good  The patient is critically ill with multiple organ system failure and requires high complexity decision making for assessment and support, frequent evaluation and titration of therapies, advanced monitoring, review of radiographic studies and interpretation of complex data.   Critical Care Time devoted to patient care services, exclusive of separately billable procedures, described in this note is 35 minutes.   Marshell Garfinkel MD [facility] 12/25/2017, 9:13 AM

## 2017-12-26 ENCOUNTER — Inpatient Hospital Stay (HOSPITAL_COMMUNITY): Payer: Medicare Other

## 2017-12-26 DIAGNOSIS — I509 Heart failure, unspecified: Secondary | ICD-10-CM

## 2017-12-26 LAB — BLOOD GAS, ARTERIAL
Acid-Base Excess: 13.9 mmol/L — ABNORMAL HIGH (ref 0.0–2.0)
Acid-Base Excess: 11.8 mmol/L — ABNORMAL HIGH (ref 0.0–2.0)
BICARBONATE: 36.1 mmol/L — AB (ref 20.0–28.0)
Bicarbonate: 38.4 mmol/L — ABNORMAL HIGH (ref 20.0–28.0)
Drawn by: 51702
Drawn by: 535271
FIO2: 40
FIO2: 40
LHR: 14 {breaths}/min
MECHVT: 400 mL
O2 Saturation: 96.1 %
O2 Saturation: 94.3 %
PATIENT TEMPERATURE: 99.9
PEEP: 10 cmH2O
PEEP: 5 cmH2O
PO2 ART: 72.6 mmHg — AB (ref 83.0–108.0)
Patient temperature: 98.6
RATE: 14 resp/min
VT: 400 mL
pCO2 arterial: 52 mmHg — ABNORMAL HIGH (ref 32.0–48.0)
pCO2 arterial: 51.1 mmHg — ABNORMAL HIGH (ref 32.0–48.0)
pH, Arterial: 7.482 — ABNORMAL HIGH (ref 7.350–7.450)
pH, Arterial: 7.466 — ABNORMAL HIGH (ref 7.350–7.450)
pO2, Arterial: 78.2 mmHg — ABNORMAL LOW (ref 83.0–108.0)

## 2017-12-26 LAB — GLUCOSE, CAPILLARY
GLUCOSE-CAPILLARY: 103 mg/dL — AB (ref 70–99)
GLUCOSE-CAPILLARY: 107 mg/dL — AB (ref 70–99)
GLUCOSE-CAPILLARY: 111 mg/dL — AB (ref 70–99)
GLUCOSE-CAPILLARY: 112 mg/dL — AB (ref 70–99)
GLUCOSE-CAPILLARY: 115 mg/dL — AB (ref 70–99)
Glucose-Capillary: 102 mg/dL — ABNORMAL HIGH (ref 70–99)
Glucose-Capillary: 111 mg/dL — ABNORMAL HIGH (ref 70–99)

## 2017-12-26 LAB — BASIC METABOLIC PANEL
Anion gap: 7 (ref 5–15)
BUN: 62 mg/dL — AB (ref 8–23)
CO2: 37 mmol/L — ABNORMAL HIGH (ref 22–32)
CREATININE: 1.32 mg/dL — AB (ref 0.44–1.00)
Calcium: 8.3 mg/dL — ABNORMAL LOW (ref 8.9–10.3)
Chloride: 107 mmol/L (ref 98–111)
GFR calc Af Amer: 41 mL/min — ABNORMAL LOW (ref >60)
GFR, EST NON AFRICAN AMERICAN: 35 mL/min — AB (ref >60)
Glucose, Bld: 123 mg/dL — ABNORMAL HIGH (ref 70–99)
Potassium: 3.5 mmol/L (ref 3.5–5.1)
SODIUM: 151 mmol/L — AB (ref 135–145)

## 2017-12-26 LAB — CBC
HCT: 24.3 % — ABNORMAL LOW (ref 36.0–46.0)
Hemoglobin: 8 g/dL — ABNORMAL LOW (ref 12.0–15.0)
MCH: 29.3 pg (ref 26.0–34.0)
MCHC: 32.9 g/dL (ref 30.0–36.0)
MCV: 89 fL (ref 78.0–100.0)
PLATELETS: 89 10*3/uL — AB (ref 150–400)
RBC: 2.73 MIL/uL — ABNORMAL LOW (ref 3.87–5.11)
RDW: 14.6 % (ref 11.5–15.5)
WBC: 8.8 10*3/uL (ref 4.0–10.5)

## 2017-12-26 LAB — CULTURE, BLOOD (ROUTINE X 2)
CULTURE: NO GROWTH
Culture: NO GROWTH
SPECIAL REQUESTS: ADEQUATE
Special Requests: ADEQUATE

## 2017-12-26 LAB — PHOSPHORUS: PHOSPHORUS: 2.7 mg/dL (ref 2.5–4.6)

## 2017-12-26 LAB — MAGNESIUM: Magnesium: 2.4 mg/dL (ref 1.7–2.4)

## 2017-12-26 LAB — CALCIUM, IONIZED: Calcium, Ionized, Serum: 4.9 mg/dL (ref 4.5–5.6)

## 2017-12-26 MED ORDER — PHENYLEPHRINE HCL-NACL 10-0.9 MG/250ML-% IV SOLN
0.0000 ug/min | INTRAVENOUS | Status: DC
  Filled 2017-12-26: qty 250

## 2017-12-26 MED ORDER — SODIUM CHLORIDE 0.9 % IV BOLUS
1000.0000 mL | Freq: Once | INTRAVENOUS | Status: AC
  Administered 2017-12-26: 1000 mL via INTRAVENOUS

## 2017-12-26 MED ORDER — FUROSEMIDE 10 MG/ML IJ SOLN
40.0000 mg | Freq: Once | INTRAMUSCULAR | Status: AC
  Administered 2017-12-26: 40 mg via INTRAVENOUS
  Filled 2017-12-26: qty 4

## 2017-12-26 MED ORDER — PANTOPRAZOLE SODIUM 40 MG IV SOLR
40.0000 mg | Freq: Every day | INTRAVENOUS | Status: DC
Start: 2017-12-26 — End: 2017-12-27
  Administered 2017-12-26: 40 mg via INTRAVENOUS
  Filled 2017-12-26: qty 40

## 2017-12-26 MED ORDER — FREE WATER
400.0000 mL | Freq: Four times a day (QID) | Status: DC
  Administered 2017-12-26 – 2017-12-29 (×11): 400 mL

## 2017-12-26 NOTE — Progress Notes (Addendum)
Physical Therapy Treatment Patient Details Name: Brittney Li MRN: [id number] DOB: 1929-11-26 Today's Date: 12/26/2017    History of Present Illness Pt is an 84 y.o. female admitted from SNF to [facility] on January 17, 2018 with worsening SOB and cough; initial workup concerning for RUL pneumonia and CHF exacerbation. Sufferred respiratory arrest in setting of hematemesis vs hemoptysis; ETT 7/24. PMH includes dementia, CHF, chronic respiratory failure, COPD (3L home O2), asthma.   PT Comments    Pt seen for re-evaluation and treatment today after respiratory distress requiring intubation. Pt up in chair having been transferred via [facility] lift by nursing. Pt able to stand and take pivotal steps back to bed with maxA and HHA. Pt with generalized weakness and decreased activity tolerance, but appears very motivated to participate. Continue to recommend SNF-level therapies to maximize functional mobility and independence. Will follow acutely to address established goals.   Follow Up Recommendations  SNF     Equipment Recommendations  (TBD)    Recommendations for Other Services OT consult     Precautions / Restrictions Precautions Precautions: Fall Precaution Comments: vent Restrictions Weight Bearing Restrictions: No    Mobility  Bed Mobility Overal bed mobility: Needs Assistance Bed Mobility: Sit to Supine       Sit to supine: Max assist   General bed mobility comments: Pt received sitting in recliner (was transferred OOB via maxisky). MaxA to assist BLEs into supine  Transfers Overall transfer level: Needs assistance Equipment used: 1 person hand held assist Transfers: Sit to/from [employer] Transfers Sit to Stand: Max assist Stand pivot transfers: Max assist       General transfer comment: Able to stand on second attempt with maxA for trunk elevation; too pivotal shuffling steps from recliner to bed with maxA and HHA  Ambulation/Gait                 Stairs              Wheelchair Mobility    Modified Rankin (Stroke Patients Only)       Balance Overall balance assessment: Needs assistance Sitting-balance support: No upper extremity supported;Feet supported Sitting balance-Leahy Scale: Fair       Standing balance-Leahy Scale: Poor                              Cognition Arousal/Alertness: Awake/alert Behavior During Therapy: WFL for tasks assessed/performed Overall Cognitive Status: Difficult to assess                                 General Comments: Following commands appropriately with increased time      Exercises      General Comments General comments (skin integrity, edema, etc.): PEEP decreased from 10 to 5cm today      Pertinent Vitals/Pain Pain Assessment: No/denies pain    Home Living                      Prior Function            PT Goals (current goals can now be found in the care plan section) Acute Rehab PT Goals Patient Stated Goal: None stated PT Goal Formulation: With patient Time For Goal Achievement: 01/08/18 Potential to Achieve Goals: Fair Progress towards PT goals: Goals downgraded-see care plan    Frequency    Min 3X/week  PT Plan Current plan remains appropriate    Co-evaluation              AM-PAC PT "6 Clicks" Daily Activity  Outcome Measure  Difficulty turning over in bed (including adjusting bedclothes, sheets and blankets)?: Unable Difficulty moving from lying on back to sitting on the side of the bed? : Unable Difficulty sitting down on and standing up from a chair with arms (e.g., wheelchair, bedside commode, etc,.)?: Unable Help needed moving to and from a bed to chair (including a wheelchair)?: A Lot Help needed walking in hospital room?: A Lot Help needed climbing 3-5 steps with a railing? : Total 6 Click Score: 8    End of Session Equipment Utilized During Treatment: Gait belt;Oxygen Activity Tolerance: Patient  tolerated treatment well;Patient limited by fatigue Patient left: in bed;with call bell/phone within reach;with bed alarm set;with family/visitor present Nurse Communication: Mobility status PT Visit Diagnosis: Unsteadiness on feet (R26.81);Other abnormalities of gait and mobility (R26.89);Muscle weakness (generalized) (M62.81)     Time: 1330-1400 PT Time Calculation (min) (ACUTE ONLY): 30 min  Charges:  $Therapeutic Activity: 8-22 mins  $Re-eval                     Ina HomesJaclyn Ellison Leisure, PT, DPT Acute Rehab Services  Pager: [phone]  Malachy ChamberJaclyn L Peter Daquila 12/26/2017, 2:26 PM

## 2017-12-26 NOTE — Progress Notes (Addendum)
eLink Physician-Brief Progress Note Patient Name: Brittney HawsMary A Li DOB: 04/11/1930 MRN: [id number]   Date of Service  12/26/2017  HPI/Events of Note  Hypotension - BP = 101/27 with MAP = 50. LVEF = 55 to 60%.  eICU Interventions  Will order: 1. Bolus with 0.9 NaCl 1 liter IV over 1 hour now.       Intervention Category Major Interventions: Hypotension - evaluation and management  Jatavion Peaster Dennard Nipugene 12/26/2017, 6:37 AM

## 2017-12-26 NOTE — Progress Notes (Addendum)
PULMONARY / CRITICAL CARE MEDICINE   Name: Brittney Li MRN: [id number] DOB: 1929/10/06    ADMISSION DATE:  12/26/2017 CONSULTATION DATE:  12/22/17  REFERRING MD:  Dr. Dyann Kief [facility])  CHIEF COMPLAINT:  Acute Hypoxic Respiratory Failure, Hemoptysis  HISTORY OF PRESENT ILLNESS:   Brittney Li is a 84 y.o. Female with a PMH of chronic respiratory failure requiring 3L home H8,NIDP,OEUMPN, Diastolic CHF, Dementia who presented to [facility] ED on 7/23 with c/o progressive Shortness of breath and cough for several days.  Initial workup at [facility] was concerning for CHF exacerbation and RUL PNA.  Pt was recently treated for CHF and discharged to nursing facility on 12/17/17. On 7/24 while at [facility], pt with hypoxia and suffered respiratory arrest in the setting of hematemesis vs hemoptysis, necessitating intubation.   Pt transferred to [facility] later in afternoon on 12/22/17 for further management of Acute on Chronic Hypoxic Respiratory failure requiring intubation and Hemoptysis. PCCM is asked to admit the pt.  PAST MEDICAL HISTORY :  She  has a past medical history of Arthritis, Asthma, Bronchitis, CHF (congestive heart failure) ([place]), COPD (chronic obstructive pulmonary disease) ([place]), Dementia, Diabetes mellitus without complication ([place]), Hyperkalemia, Hypertension, Renal disorder, and Respiratory failure ([place]).  PAST SURGICAL HISTORY: She  has a past surgical history that includes hands and Abdominal hysterectomy (1975).  Allergies  Allergen Reactions  . Erythromycin Other (See Comments)    Unknown   . Fluviral [Flu Virus Vaccine]   . Penicillins Other (See Comments)    .Has patient had a PCN reaction causing immediate rash, facial/tongue/throat swelling, SOB or lightheadedness with hypotension: Unknown Has patient had a PCN reaction causing severe rash involving mucus membranes or skin necrosis:No Has patient had a PCN reaction that required hospitalization: Yes Has patient had  a PCN reaction occurring within the last 10 years: No If all of the above answers are "NO", then may proceed with Cephalosporin use.     No current facility-administered medications on file prior to encounter.    Current Outpatient Medications on File Prior to Encounter  Medication Sig  . albuterol (PROVENTIL) (2.5 MG/3ML) 0.083% nebulizer solution Take 2.5 mg by nebulization every 6 (six) hours as needed for wheezing.  Marland Kitchen aspirin 81 MG tablet Take 81 mg by mouth daily.    Marland Kitchen atorvastatin (LIPITOR) 20 MG tablet Take 20 mg by mouth daily at 6 PM.  . dextromethorphan (DELSYM) 30 MG/5ML liquid Take 5 mLs (30 mg total) by mouth 2 (two) times daily as needed for cough.  . docusate sodium (COLACE) 100 MG capsule Take 100 mg by mouth daily as needed for mild constipation or moderate constipation.   Marland Kitchen donepezil (ARICEPT) 10 MG tablet Take 10 mg by mouth at bedtime.  . famotidine (PEPCID) 20 MG tablet Take 20 mg by mouth daily.  . mirtazapine (REMERON) 30 MG tablet Take 30 mg by mouth at bedtime.  . mometasone-formoterol (DULERA) 200-5 MCG/ACT AERO Inhale 2 puffs into the lungs 2 (two) times daily.  . Multiple Vitamin (MULTIVITAMIN WITH MINERALS) TABS tablet Take 1 tablet by mouth daily.  Marland Kitchen nystatin (MYCOSTATIN) 100000 UNIT/ML suspension Use as directed 5 mLs in the mouth or throat 4 (four) times daily. 7 DAY COURSE TO END ON 12/27/2017  . oxybutynin (DITROPAN-XL) 5 MG 24 hr tablet Take 5 mg by mouth every evening.  . potassium chloride SA (K-DUR,KLOR-CON) 20 MEQ tablet Take 2 tablets (40 mEq total) by mouth daily.  Marland Kitchen torsemide (DEMADEX) 20 MG  tablet TAKE 40 MG twice daily (Patient taking differently: Take 60 mg by mouth 2 (two) times daily. )    FAMILY HISTORY:  Her family history includes Arthritis in her unknown relative; Asthma in her unknown relative; Diabetes in her unknown relative.  SOCIAL HISTORY: She  reports that she has quit smoking. She has never used smokeless tobacco. She reports that  she does not drink alcohol or use drugs.  REVIEW OF SYSTEMS:   Unable to obtain due to intubation  SUBJECTIVE:  Reports L hip pain No pressors or sedation  VITAL SIGNS: BP 100/69   Pulse 65   Temp 99.5 F (37.5 C)   Resp 17   Ht _0  (1.626 m)   Wt 182 lb 15.7 oz (83 kg)   SpO2 100%   BMI 31.41 kg/m   HEMODYNAMICS:    VENTILATOR SETTINGS: Vent Mode: PRVC FiO2 (%):  [40 %] 40 % Set Rate:  [14 bmp] 14 bmp Vt Set:  [400 mL] 400 mL PEEP:  [10 cmH20] 10 cmH20 Plateau Pressure:  [18 cmH20-22 cmH20] 18 cmH20  INTAKE / OUTPUT: I/O last 3 completed shifts: In: 2646.5 [P.O.:120; I.V.:379.4; NG/GT:1550; IV Piggyback:597] Out: 2820 [Urine:2820]  PHYSICAL EXAMINATION: General:  Acute on chronically ill, elderly female, intubated, alert, calm Neuro:  Awake and alert, follows commands Cardiovascular:  Irregular rhythm, normal rate, no MRG Lungs:  Coarse on right, clear on left, no wheezing, normal WOB Abdomen:  Soft, BS+ x4, nontender to palpation Musculoskeletal:  No deformities, 2+ bilateral LE edema, tender to palpation of L hip Skin:  No obvious rashes, lesions, or ulcerations  LABS:  BMET Recent Labs  Lab 12/24/17 0320 12/25/17 0245 12/26/17 0400  NA 148* 150* 151*  K 3.8 3.5 3.5  CL 103 105 107  CO2 34* 36* 37*  BUN 63* 62* 62*  CREATININE 1.75* 1.54* 1.32*  GLUCOSE 112* 130* 123*    Electrolytes Recent Labs  Lab 12/24/17 0320 12/24/17 1653 12/25/17 0245 12/26/17 0400  CALCIUM 8.7*  --  8.5* 8.3*  MG 2.3 2.3 2.3 2.4  PHOS 3.2 3.1 3.1 2.7    CBC Recent Labs  Lab 12/24/17 0320 12/25/17 0245 12/26/17 0400  WBC 8.7 8.2 8.8  HGB 8.7* 8.3* 8.0*  HCT 25.4* 25.0* 24.3*  PLT 94* 92* 89*    Coag's Recent Labs  Lab 12/22/17 1620  APTT 28  INR 1.30    Sepsis Markers Recent Labs  Lab 12/09/2017 1556 12/22/17 1620 12/23/17 0136 12/24/17 0320  LATICACIDVEN 1.66  --   --   --   PROCALCITON  --  <0.10 0.14 0.21    ABG Recent Labs  Lab  12/23/17 0331 12/23/17 1235 12/26/17 0435  PHART 7.559* 7.483* 7.482*  PCO2ART 44.2 52.4* 52.0*  PO2ART 71.0* 93.9 78.2*    Liver Enzymes Recent Labs  Lab 12/10/2017 1543  AST 40  ALT 30  ALKPHOS 84  BILITOT 0.8  ALBUMIN 3.2*    Cardiac Enzymes Recent Labs  Lab 12/22/17 1650 12/23/17 0136 12/23/17 0544  TROPONINI 0.35* 0.44* 0.48*    Glucose Recent Labs  Lab 12/25/17 1212 12/25/17 1558 12/25/17 1917 12/25/17 2358 12/26/17 0351 12/26/17 0733  GLUCAP 136* 127* 118* 111* 115* 102*    Imaging Dg Chest Port 1 View  Result Date: 12/26/2017 CLINICAL DATA:  Acute respiratory failure EXAM: PORTABLE CHEST 1 VIEW COMPARISON:  Chest radiograph from one day prior. FINDINGS: Endotracheal tube tip is 4.2 cm above the carina. Enteric tube enters stomach with  the tip not seen on this image. Left PICC terminates in the lower third of the SVC. Stable cardiomediastinal silhouette with mild cardiomegaly. No pneumothorax. Stable small left pleural effusion. No right pleural effusion. Patchy right upper lung opacity appears slightly improved. Mild reticular opacities at the lung bases are stable. IMPRESSION: 1. Well position support structures. 2. Stable small left pleural effusion. 3. Patchy right upper lung opacity appears slightly improved. Electronically Signed   By: Ilona Sorrel M.D.   On: 12/26/2017 07:13   Dg Chest Port 1 View  Result Date: 12/25/2017 CLINICAL DATA:  Status post PICC line insertion. EXAM: PORTABLE CHEST 1 VIEW COMPARISON:  December 24, 2017 FINDINGS: An ETT terminates in good position. The NG tube terminates in left upper quadrant. A new left PICC line terminates in the central SVC. In infiltrate in the right upper lobe is stable. There is a layering effusion with underlying opacity on the left. There is mild opacity in the right base is well, possibly with a small associated effusion. The cardiomediastinal silhouette is stable. No pneumothorax. IMPRESSION: 1. The new left  PICC line is in good position, terminating in the central SVC. Other support apparatus as above. 2. Probable bilateral layering effusions with underlying opacities. Focal infiltrate in the right upper lobe. Mild opacity in the right lower lobe as well. Electronically Signed   By: Dorise Bullion III M.D   On: 12/25/2017 14:32   [place] Ekg Site Rite  Result Date: 12/25/2017 If Site Rite image not attached, placement could not be confirmed due to current cardiac rhythm.    STUDIES:  CT Chest w/ Contrast 7/24>> Bronchoscopy 7/24>>  CULTURES: Blood x2 7/23>> NG2D MRSA PCR 7/23>> Negative Strep Pneumo urinary antigen 7/24>> negative Legionella urinary antigen 7/24>> BAL 7/24>> normal respiratory flora Urine culture 7/24>> Pleural fluid culture 7/26 >> NG<24H  ANTIBIOTICS: Aztreonam 7/23>> 7/26 Vancomycin 7/23>> 7/26 Ceftaz 7/26 >>  SIGNIFICANT EVENTS: 12/11/2017>> Presented to [facility] 12/22/17>> Intubated, hemoptysis, transfer to [facility] 12/24/17>> Thoracentesis showing transudative fluid  LINES/TUBES: ETT 7/24>> Rt radial A line 7/24 >  R PICC 7/27>  DISCUSSION: 84 y.o. Female with Acute on Chronic Hypoxic Respiratory failure requiring intubation in setting of CHF exacerbation vs R PNA (aspiration) vs COPD exacerbation, and hemoptysis vs hematemesis  ASSESSMENT / PLAN:  PULMONARY A: Acute on Chronic Hypoxic Respiratory Failure Bilateral pleural effusions, RUL and bibasilar infiltrates Concern for aspiration PNA Hemoptysis Bronch with no active bleed Hx: COPD, Asthma P:   Continue vent support Wean PEEP Follow chest x-ray Right thoracentesis shows transudative pleural fluid, likely due to CHF  CARDIOVASCULAR A:  Acute on Chronic CHF (diastolic) Hx: CHF (diastolic), HTN Ventricular tachycardia, intermittent, possibly due to alkalosis No pressors needed currently Echo noted with grade 2 diastolic dysfunction, high ventricular filling pressure, pulm HTN BNP 1,977 P:   Lasix 40 mg IV once  RENAL A:   AKI, improving, creatinine 1.32 on 7/28 Hypernatremia P:   Follow urine output and Cr Increase free water to 400 ml Q6H  GASTROINTESTINAL A:   Rule out GI Bleed - GI thinks bleeding likely esophagitis or traumatic intubation, will not plan on endoscopy, agrees with PPI P:   Tube feeds PPI, change to daily  HEMATOLOGIC A:   Anemia, stable Thrombocytopenia, stable P:  Monitor for s/sx of bleeding Trend CBC SCD's for VTE prophylaxis (given hemoptysis) Transfuse for Hgb<7 Transfuse for Plt<50 and active bleeding  INFECTIOUS A:   Increased opacity RUL on CXR, possible aspiration PNA -No  fevers or leukocytosis P:   Abx narrowed to ceftaz. Follow cultures  ENDOCRINE A:   Hx: DM II   P:   SSI coverage Follow ICU hypo/hyperglycemia protocol  NEUROLOGIC A:   Sedation needs in setting of mechanical ventilation Hx: Dementia P:   RASS goal: 0 to -1 Prn fentanyl and versed pushes maintain RASS goal Continue Aricept, Remeron  FAMILY  - Updates: No family at bedside - Inter-disciplinary family meet or Palliative Care meeting due by: 12/29/17  BEST PRACTICE / DISPOSITION Level of Care:  ICU Primary Service:  PCCM Consultants: GI  Code Status:  Full Diet:  Tube feeds DVT Px:  Hep SQ, SCDs GI Px:  PPI daily Skin Integrity:  Good  Amanda C. Shan Levans, MD PGY-2, [facility] 12/26/2017 8:03 AM

## 2017-12-27 ENCOUNTER — Inpatient Hospital Stay (HOSPITAL_COMMUNITY): Payer: Medicare Other

## 2017-12-27 DIAGNOSIS — I5033 Acute on chronic diastolic (congestive) heart failure: Secondary | ICD-10-CM

## 2017-12-27 LAB — BASIC METABOLIC PANEL
ANION GAP: 6 (ref 5–15)
Anion gap: 8 (ref 5–15)
BUN: 63 mg/dL — AB (ref 8–23)
BUN: 60 mg/dL — ABNORMAL HIGH (ref 8–23)
CHLORIDE: 110 mmol/L (ref 98–111)
CO2: 34 mmol/L — ABNORMAL HIGH (ref 22–32)
CO2: 37 mmol/L — AB (ref 22–32)
CREATININE: 1.26 mg/dL — AB (ref 0.44–1.00)
Calcium: 8.1 mg/dL — ABNORMAL LOW (ref 8.9–10.3)
Calcium: 8.2 mg/dL — ABNORMAL LOW (ref 8.9–10.3)
Chloride: 109 mmol/L (ref 98–111)
Creatinine, Ser: 1.14 mg/dL — ABNORMAL HIGH (ref 0.44–1.00)
GFR calc Af Amer: 43 mL/min — ABNORMAL LOW (ref >60)
GFR calc Af Amer: 49 mL/min — ABNORMAL LOW (ref >60)
GFR calc non Af Amer: 37 mL/min — ABNORMAL LOW (ref >60)
GFR calc non Af Amer: 42 mL/min — ABNORMAL LOW (ref >60)
GLUCOSE: 132 mg/dL — AB (ref 70–99)
Glucose, Bld: 112 mg/dL — ABNORMAL HIGH (ref 70–99)
POTASSIUM: 3.4 mmol/L — AB (ref 3.5–5.1)
Potassium: 3.4 mmol/L — ABNORMAL LOW (ref 3.5–5.1)
SODIUM: 152 mmol/L — AB (ref 135–145)
Sodium: 152 mmol/L — ABNORMAL HIGH (ref 135–145)

## 2017-12-27 LAB — C DIFFICILE QUICK SCREEN W PCR REFLEX
C DIFFICILE (CDIFF) TOXIN: NEGATIVE
C Diff antigen: NEGATIVE
C Diff interpretation: NOT DETECTED

## 2017-12-27 LAB — CBC
HEMATOCRIT: 22.4 % — AB (ref 36.0–46.0)
HEMOGLOBIN: 7.3 g/dL — AB (ref 12.0–15.0)
MCH: 29.2 pg (ref 26.0–34.0)
MCHC: 32.6 g/dL (ref 30.0–36.0)
MCV: 89.6 fL (ref 78.0–100.0)
Platelets: 90 10*3/uL — ABNORMAL LOW (ref 150–400)
RBC: 2.5 MIL/uL — ABNORMAL LOW (ref 3.87–5.11)
RDW: 14.6 % (ref 11.5–15.5)
WBC: 9.4 10*3/uL (ref 4.0–10.5)

## 2017-12-27 LAB — GLUCOSE, CAPILLARY
GLUCOSE-CAPILLARY: 104 mg/dL — AB (ref 70–99)
GLUCOSE-CAPILLARY: 115 mg/dL — AB (ref 70–99)
GLUCOSE-CAPILLARY: 114 mg/dL — AB (ref 70–99)
Glucose-Capillary: 108 mg/dL — ABNORMAL HIGH (ref 70–99)
Glucose-Capillary: 106 mg/dL — ABNORMAL HIGH (ref 70–99)
Glucose-Capillary: 97 mg/dL (ref 70–99)

## 2017-12-27 LAB — MAGNESIUM: Magnesium: 2.4 mg/dL (ref 1.7–2.4)

## 2017-12-27 MED ORDER — POTASSIUM CHLORIDE 10 MEQ/100ML IV SOLN
10.0000 meq | INTRAVENOUS | Status: AC
  Administered 2017-12-27 (×5): 10 meq via INTRAVENOUS
  Filled 2017-12-27 (×5): qty 100

## 2017-12-27 MED ORDER — PHENYLEPHRINE HCL-NACL 10-0.9 MG/250ML-% IV SOLN
0.0000 ug/min | INTRAVENOUS | Status: DC
  Administered 2017-12-28: 60 ug/min via INTRAVENOUS
  Administered 2017-12-28: 20 ug/min via INTRAVENOUS
  Administered 2017-12-28: 50 ug/min via INTRAVENOUS
  Administered 2017-12-28: 30 ug/min via INTRAVENOUS
  Administered 2017-12-28: 20 ug/min via INTRAVENOUS
  Administered 2017-12-29: 12 ug/min via INTRAVENOUS
  Administered 2017-12-29: 22 ug/min via INTRAVENOUS
  Filled 2017-12-27 (×8): qty 250

## 2017-12-27 MED ORDER — PANTOPRAZOLE SODIUM 40 MG PO PACK
40.0000 mg | PACK | Freq: Every day | ORAL | Status: DC
  Administered 2017-12-27 – 2017-12-31 (×5): 40 mg
  Filled 2017-12-27 (×6): qty 20

## 2017-12-27 MED ORDER — POTASSIUM CHLORIDE 10 MEQ/50ML IV SOLN
10.0000 meq | INTRAVENOUS | Status: AC
  Administered 2017-12-28 (×4): 10 meq via INTRAVENOUS
  Filled 2017-12-27 (×4): qty 50

## 2017-12-27 MED ORDER — SODIUM CHLORIDE 0.9 % IV BOLUS
500.0000 mL | Freq: Once | INTRAVENOUS | Status: AC
  Administered 2017-12-28: 500 mL via INTRAVENOUS

## 2017-12-27 NOTE — Progress Notes (Signed)
OT Note Addendum for Charges    12/27/17 1551  OT Visit Information  Last OT Received On 12/27/17  OT General Charges  $OT Visit 1 Visit  OT Evaluation  $OT Eval High Complexity 1 High  OT Treatments  $Self Care/Home Management  8-22 mins   Fredric MareBailey A. Brett Albinooffey, M.S., OTR/L Acute Rehab Department: [phone]

## 2017-12-27 NOTE — Progress Notes (Signed)
eLink Physician-Brief Progress Note Patient Name: Brittney HawsMary A Li DOB: 11/28/1929 MRN: [id number]   Date of Service  12/27/2017  HPI/Events of Note  Multiple issues: 1. K+ = 3.4 and Creatinine = 1.14 and 2. Hypotension = BP = 87/43 with MAP = 55. CVP = 18. LVEF = 55-60% and PA pressure = 57.  eICU Interventions  Will order: 1. Replace K+. 2. Bolus with 0.9 NaCl 500 mL IV over 30 minutes now.  3. Phenylephrine IV infusion. Titrate to MAP >= 65.      Intervention Category Major Interventions: Hypotension - evaluation and management  Sommer,Steven Eugene 12/27/2017, 11:53 PM

## 2017-12-27 NOTE — Progress Notes (Signed)
CSW continues to follow for placement needs. CSW aware that pt remains on vent at 40 percent at this time. CSW to remain involved for support to pt at this time.   Claude MangesKierra S. Muhammed Teutsch, MSW, LCSW-A Emergency Department Clinical Social Worker [phone]

## 2017-12-27 NOTE — Progress Notes (Signed)
Notified dr sommers regarding pt ectopy and bp trending lower, orders noted

## 2017-12-27 NOTE — Progress Notes (Signed)
Occupational Therapy Evaluation Patient Details Name: Brittney HawsMary A Li MRN: [id number] DOB: 05/28/1930 Today's Date: 12/27/2017    History of Present Illness Pt is an 84 y.o. female admitted from SNF to [facility] on 12/25/2017 with worsening SOB and cough; initial workup concerning for RUL pneumonia and CHF exacerbation. Sufferred respiratory arrest in setting of hematemesis vs hemoptysis; ETT 7/24. PMH includes dementia, CHF, chronic respiratory failure, COPD (3L home O2), asthma.   Clinical Impression   Per pt's chart, PTA, pt was required assistance from home aide and used a RW for household ambulation. Pt currently requires maxA +2 for safety/equipment for functional mobility. Pt required minguard to use suction and wipe mouth of secretion. Notable edema in bilateral hands, pt repositioned and educated nsg/pt on importance of elevation. VSS during session, pt had consistent amount of secretions during session, nsg aware. Due to deficits listed below (see OT problem list), pt will benefit from continued OT services to maximize independence and safety with ADLs and functional mobility. Based on pt's current level of function, recommend SNF follow-up.     Follow Up Recommendations  SNF;Supervision/Assistance - 24 hour    Equipment Recommendations  Other (comment)(TBD by next venue)    Recommendations for Other Services PT consult     Precautions / Restrictions Precautions Precautions: Fall Precaution Comments: vent Restrictions Weight Bearing Restrictions: No      Mobility Bed Mobility Overal bed mobility: Needs Assistance Bed Mobility: Sit to Supine     Supine to sit: Min assist     General bed mobility comments: pt able to progress to sitting upright with legs off bed;minA to progress hips to EOB for feet flat on floor  Transfers Overall transfer level: Needs assistance Equipment used: 1 person hand held assist Transfers: Sit to/from BJ'sStand;Stand Pivot Transfers Sit to Stand: Max  assist;+2 safety/equipment Stand pivot transfers: Max assist;+2 safety/equipment       General transfer comment: Able to stand on second attempt with maxA for powerup;pivot with shuffle steps from EOB to recliner    Balance Overall balance assessment: Needs assistance Sitting-balance support: Single extremity supported;Feet supported Sitting balance-Leahy Scale: Fair     Standing balance support: Bilateral upper extremity supported;During functional activity Standing balance-Leahy Scale: Poor Standing balance comment: maxA to power upright to standing                           ADL either performed or assessed with clinical judgement   ADL Overall ADL's : Needs assistance/impaired     Grooming: Sitting;Min guard Grooming Details (indicate cue type and reason): assistance for line management Upper Body Bathing: Minimal assistance   Lower Body Bathing: Maximal assistance;Sit to/from stand   Upper Body Dressing : Minimal assistance;Sitting   Lower Body Dressing: Maximal assistance;Sit to/from stand   Toilet Transfer: Maximal assistance;Stand-pivot;Grab bars;+2 for safety/equipment Toilet Transfer Details (indicate cue type and reason): simulated stand pivot from EOB to recliner Toileting- Clothing Manipulation and Hygiene: Maximal assistance;Sit to/from stand;+2 for safety/equipment       Functional mobility during ADLs: Maximal assistance;+2 for safety/equipment       Vision         Perception     Praxis      Pertinent Vitals/Pain Pain Assessment: Faces Faces Pain Scale: Hurts even more Pain Location: R should during movement;bilateral hands Pain Descriptors / Indicators: Grimacing;Constant Pain Intervention(s): Limited activity within patient's tolerance;Monitored during session;Repositioned     Hand Dominance Right   Extremity/Trunk Assessment  Upper Extremity Assessment Upper Extremity Assessment: RUE deficits/detail;LUE deficits/detail RUE  Deficits / Details: edema distal>proximal;pt reports pain in RUE;pain in R shoulder exacerbated by shoulder flexion past 40 degrees;AROM elbow WNL;pt unable to make a tight composite fist RUE Sensation: (rhand diff than left hand;pt unable to provide detail) RUE Coordination: decreased fine motor LUE Deficits / Details: edema distal>proximal;AROM elbow WNL;unable to make tight fist;no pain in shoulder;shoulder felxion AROM WFL, will further assess   Lower Extremity Assessment Lower Extremity Assessment: Defer to PT evaluation   Cervical / Trunk Assessment Cervical / Trunk Assessment: Kyphotic   Communication Communication Communication: Other (comment);Expressive difficulties(due to intubation)   Cognition Arousal/Alertness: Awake/alert Behavior During Therapy: WFL for tasks assessed/performed Overall Cognitive Status: Difficult to assess                                 General Comments: Following commands appropriately with increased time   General Comments  placed maximove under pt in recliner;educated pt on elevating BUE to reduce edema;VSS during session;pt had significant secretions during entire session, nsg aware    Exercises     Shoulder Instructions      Home Living Family/patient expects to be discharged to:: Skilled nursing facility Living Arrangements: Alone Available Help at Discharge: Personal care attendant;Available PRN/intermittently Type of Home: Apartment Home Access: Ramped entrance     Home Layout: One level         Bathroom Toilet: Handicapped height     Home Equipment: Walker - standard;Bedside commode;Tub bench;Wheelchair - manual;Cane - single point;Walker - 2 wheels;Walker - 4 wheels;Shower seat   Additional Comments: difficulty acquiring pt's home setup and prior level of function, information stated above per chart      Prior Functioning/Environment Level of Independence: Needs assistance  Gait / Transfers Assistance Needed:  household ambulation with RW ADL's / Homemaking Assistance Needed: home aides 7 hours/day x 6 days/week, 5 hours/day on Saturday   Comments: difficulty acquiring pt's home setup and prior level of function, information stated above per chart        OT Problem List: Decreased range of motion;Decreased strength;Decreased activity tolerance;Impaired balance (sitting and/or standing);Decreased safety awareness;Cardiopulmonary status limiting activity;Impaired sensation;Pain;Impaired UE functional use      OT Treatment/Interventions: Self-care/ADL training;Therapeutic exercise;Energy conservation;DME and/or AE instruction;Therapeutic activities;Patient/family education;Balance training    OT Goals(Current goals can be found in the care plan section) Acute Rehab OT Goals Patient Stated Goal: None stated Time For Goal Achievement: 01/10/18 Potential to Achieve Goals: Good  OT Frequency: Min 2X/week   Barriers to D/C:            Co-evaluation              AM-PAC PT "6 Clicks" Daily Activity     Outcome Measure Help from another person eating meals?: A Little Help from another person taking care of personal grooming?: A Lot Help from another person toileting, which includes using toliet, bedpan, or urinal?: A Lot Help from another person bathing (including washing, rinsing, drying)?: A Lot Help from another person to put on and taking off regular upper body clothing?: A Lot Help from another person to put on and taking off regular lower body clothing?: A Lot 6 Click Score: 13   End of Session Equipment Utilized During Treatment: Gait belt Nurse Communication: Mobility status  Activity Tolerance: Patient tolerated treatment well Patient left: in chair;with call bell/phone within reach  OT Visit Diagnosis:  Unsteadiness on feet (R26.81);Other abnormalities of gait and mobility (R26.89);Pain Pain - Right/Left: Right Pain - part of body: Shoulder;Arm;Hand                Time:  1308-6578 OT Time Calculation (min): 30 min Charges:     Diona Browner OTS    Diona Browner 12/27/2017, 3:44 PM

## 2017-12-27 NOTE — Progress Notes (Signed)
eLink Physician-Brief Progress Note Patient Name: Brittney HawsMary A Li DOB: 03/29/1930 MRN: [id number]   Date of Service  12/27/2017  HPI/Events of Note  Labile BP - BP normal to soft. Bedside monitor reveals frequent PVC's with post compensatory pauses.   eICU Interventions  Will order: 1. Monitor CVP. 2. BMP and Mg++ levels STAT.     Intervention Category Major Interventions: Hypotension - evaluation and management  Benji Poynter Eugene 12/27/2017, 10:24 PM

## 2017-12-27 NOTE — Progress Notes (Signed)
PULMONARY / CRITICAL CARE MEDICINE   Name: Brittney Li MRN: [id number] DOB: [birth year]    ADMISSION DATE:  12/13/2017 CONSULTATION DATE:  12/22/17  REFERRING MD:  Dr. Dyann Kief [facility])  CHIEF COMPLAINT:  Acute Hypoxic Respiratory Failure, Hemoptysis  HISTORY OF PRESENT ILLNESS:   Brittney Li is a 84 y.o. Female with a PMH of chronic respiratory failure requiring 3L home E0,PQZR,AQTMAU, Diastolic CHF, Dementia who presented to [facility] ED on 7/23 with c/o progressive Shortness of breath and cough for several days.  Initial workup at [facility] was concerning for CHF exacerbation and RUL PNA.  Pt was recently treated for CHF and discharged to nursing facility on 12/17/17. On 7/24 while at [facility], pt with hypoxia and suffered respiratory arrest in the setting of hematemesis vs hemoptysis, necessitating intubation.   Pt transferred to [facility] later in afternoon on 12/22/17 for further management of Acute on Chronic Hypoxic Respiratory failure requiring intubation and Hemoptysis. PCCM is asked to admit the pt.  PAST MEDICAL HISTORY :  She  has a past medical history of Arthritis, Asthma, Bronchitis, CHF (congestive heart failure) ([place]), COPD (chronic obstructive pulmonary disease) ([place]), Dementia, Diabetes mellitus without complication ([place]), Hyperkalemia, Hypertension, Renal disorder, and Respiratory failure ([place]).  PAST SURGICAL HISTORY: She  has a past surgical history that includes hands and Abdominal hysterectomy (1975).  Allergies  Allergen Reactions  . Erythromycin Other (See Comments)    Unknown   . Fluviral [Flu Virus Vaccine]   . Penicillins Other (See Comments)    .Has patient had a PCN reaction causing immediate rash, facial/tongue/throat swelling, SOB or lightheadedness with hypotension: Unknown Has patient had a PCN reaction causing severe rash involving mucus membranes or skin necrosis:No Has patient had a PCN reaction that required hospitalization: Yes Has patient had  a PCN reaction occurring within the last 10 years: No If all of the above answers are "NO", then may proceed with Cephalosporin use.     No current facility-administered medications on file prior to encounter.    Current Outpatient Medications on File Prior to Encounter  Medication Sig  . albuterol (PROVENTIL) (2.5 MG/3ML) 0.083% nebulizer solution Take 2.5 mg by nebulization every 6 (six) hours as needed for wheezing.  Marland Kitchen aspirin 81 MG tablet Take 81 mg by mouth daily.    Marland Kitchen atorvastatin (LIPITOR) 20 MG tablet Take 20 mg by mouth daily at 6 PM.  . dextromethorphan (DELSYM) 30 MG/5ML liquid Take 5 mLs (30 mg total) by mouth 2 (two) times daily as needed for cough.  . docusate sodium (COLACE) 100 MG capsule Take 100 mg by mouth daily as needed for mild constipation or moderate constipation.   Marland Kitchen donepezil (ARICEPT) 10 MG tablet Take 10 mg by mouth at bedtime.  . famotidine (PEPCID) 20 MG tablet Take 20 mg by mouth daily.  . mirtazapine (REMERON) 30 MG tablet Take 30 mg by mouth at bedtime.  . mometasone-formoterol (DULERA) 200-5 MCG/ACT AERO Inhale 2 puffs into the lungs 2 (two) times daily.  . Multiple Vitamin (MULTIVITAMIN WITH MINERALS) TABS tablet Take 1 tablet by mouth daily.  Marland Kitchen nystatin (MYCOSTATIN) 100000 UNIT/ML suspension Use as directed 5 mLs in the mouth or throat 4 (four) times daily. 7 DAY COURSE TO END ON 12/27/2017  . oxybutynin (DITROPAN-XL) 5 MG 24 hr tablet Take 5 mg by mouth every evening.  . potassium chloride SA (K-DUR,KLOR-CON) 20 MEQ tablet Take 2 tablets (40 mEq total) by mouth daily.  Marland Kitchen torsemide (DEMADEX) 20 MG  tablet TAKE 40 MG twice daily (Patient taking differently: Take 60 mg by mouth 2 (two) times daily. )    FAMILY HISTORY:  Her family history includes Arthritis in her unknown relative; Asthma in her unknown relative; Diabetes in her unknown relative.  SOCIAL HISTORY: She  reports that she has quit smoking. She has never used smokeless tobacco. She reports that  she does not drink alcohol or use drugs.  REVIEW OF SYSTEMS:   Unable to obtain due to intubation  SUBJECTIVE:  No complaints  VITAL SIGNS: BP (!) 120/46   Pulse 64   Temp 100.2 F (37.9 C)   Resp (!) 22   Ht _0  (1.626 m)   Wt 192 lb 14.4 oz (87.5 kg)   SpO2 95%   BMI 33.11 kg/m   HEMODYNAMICS:    VENTILATOR SETTINGS: Vent Mode: PSV;CPAP FiO2 (%):  [40 %] 40 % Set Rate:  [14 bmp] 14 bmp Vt Set:  [400 mL] 400 mL PEEP:  [5 cmH20-7 cmH20] 5 cmH20 Pressure Support:  [10 cmH20] 10 cmH20 Plateau Pressure:  [16 cmH20-19 cmH20] 18 cmH20  INTAKE / OUTPUT: I/O last 3 completed shifts: In: 3719.9 [P.O.:120; I.V.:309.4; NG/GT:2200; IV Piggyback:1090.4] Out: 2425 [Urine:2425]  PHYSICAL EXAMINATION: General:  Acute on chronically ill, elderly female, intubated, alert, calm Neuro:  Awake and alert, follows commands, answers questions Cardiovascular: Regular rhythm, normal rate, no MRG Lungs:  Coarse on right, clear on left, no wheezing, normal WOB Abdomen:  Soft, BS+ x4, nontender to palpation Musculoskeletal:  No deformities, 2+ bilateral LE edema, tender to palpation of L hip Skin:  No obvious rashes, lesions, or ulcerations  LABS:  BMET Recent Labs  Lab 12/25/17 0245 12/26/17 0400 12/27/17 0207  NA 150* 151* 152*  K 3.5 3.5 3.4*  CL 105 107 110  CO2 36* 37* 34*  BUN 62* 62* 63*  CREATININE 1.54* 1.32* 1.26*  GLUCOSE 130* 123* 132*    Electrolytes Recent Labs  Lab 12/24/17 1653 12/25/17 0245 12/26/17 0400 12/27/17 0207  CALCIUM  --  8.5* 8.3* 8.1*  MG 2.3 2.3 2.4  --   PHOS 3.1 3.1 2.7  --     CBC Recent Labs  Lab 12/25/17 0245 12/26/17 0400 12/27/17 0207  WBC 8.2 8.8 9.4  HGB 8.3* 8.0* 7.3*  HCT 25.0* 24.3* 22.4*  PLT 92* 89* 90*    Coag's Recent Labs  Lab 12/22/17 1620  APTT 28  INR 1.30    Sepsis Markers Recent Labs  Lab 12/04/2017 1556 12/22/17 1620 12/23/17 0136 12/24/17 0320  LATICACIDVEN 1.66  --   --   --   PROCALCITON   --  <0.10 0.14 0.21    ABG Recent Labs  Lab 12/23/17 1235 12/26/17 0435 12/26/17 1415  PHART 7.483* 7.482* 7.466*  PCO2ART 52.4* 52.0* 51.1*  PO2ART 93.9 78.2* 72.6*    Liver Enzymes Recent Labs  Lab 12/20/2017 1543  AST 40  ALT 30  ALKPHOS 84  BILITOT 0.8  ALBUMIN 3.2*    Cardiac Enzymes Recent Labs  Lab 12/22/17 1650 12/23/17 0136 12/23/17 0544  TROPONINI 0.35* 0.44* 0.48*    Glucose Recent Labs  Lab 12/26/17 1236 12/26/17 1540 12/26/17 1925 12/26/17 2342 12/27/17 0336 12/27/17 0718  GLUCAP 111* 112* 103* 107* 104* 108*    Imaging No results found.   STUDIES:  CT Chest w/ Contrast 7/24>> Bronchoscopy 7/24>> C diff 7/28>> negative  CULTURES: Blood x2 7/23>> NG5D MRSA PCR 7/23>> Negative Strep Pneumo urinary antigen 7/24>> negative  Legionella urinary antigen 7/24>> BAL 7/24>> normal respiratory flora Urine culture 7/24>>NG Pleural fluid culture 7/26 >> NG1D  ANTIBIOTICS: Aztreonam 7/23>> 7/26 Vancomycin 7/23>> 7/26 Ceftaz 7/26 >>  SIGNIFICANT EVENTS: 12/26/2017>> Presented to [facility] 12/22/17>> Intubated, hemoptysis, transfer to [facility] 12/24/17>> Thoracentesis showing transudative fluid 12/26/17>> weaned further on vent  LINES/TUBES: ETT 7/24>> Rt radial A line 7/24 >  R PICC 7/27>  DISCUSSION: 84 y.o. Female with Acute on Chronic Hypoxic Respiratory failure requiring intubation in setting of CHF exacerbation vs R PNA (aspiration) vs COPD exacerbation, and hemoptysis vs hematemesis  ASSESSMENT / PLAN:  PULMONARY A: Acute on Chronic Hypoxic Respiratory Failure, improving Bilateral pleural effusions, RUL and bibasilar infiltrates Concern for aspiration PNA Hx: COPD, Asthma P:   SBT today, may be able to extubate on 7/30 Follow chest x-ray  CARDIOVASCULAR A:  Acute on Chronic CHF (diastolic) Hx: CHF (diastolic), HTN Echo noted with grade 2 diastolic dysfunction, high ventricular filling pressure, pulm HTN BNP 1,977 P:   Continue free water  RENAL A:   AKI, improving, creatinine 1.26 on 7/29 Hypernatremia Mild hypokalemia P:   Follow urine output and Cr Continue free water to 400 ml Q4H Replete K Stop Lasix  GASTROINTESTINAL A:   Rule out GI Bleed - GI thinks bleeding likely esophagitis or traumatic intubation, will not plan on endoscopy, agrees with PPI Diarrhea, C diff negative P:   Tube feeds PPI daily  HEMATOLOGIC A:   Anemia, Hgb decreased 8.0>7.3 on 7/29 Thrombocytopenia, stable P:  Monitor for s/sx of bleeding Trend CBC SCD's for VTE prophylaxis (given hemoptysis) Transfuse for Hgb<7 Transfuse for Plt<50 and active bleeding  INFECTIOUS A:   Increased opacity RUL on CXR, possible aspiration PNA Fever overnight to 100.6 P:   Abx narrowed to ceftaz. Follow cultures  ENDOCRINE A:   Hx: DM II   P:   SSI coverage Follow ICU hypo/hyperglycemia protocol  NEUROLOGIC A:   Sedation needs in setting of mechanical ventilation Hx: Dementia P:   RASS goal: 0 to -1 Prn fentanyl and versed pushes maintain RASS goal Continue Aricept, Remeron  FAMILY  - Updates: No family at bedside - Inter-disciplinary family meet or Palliative Care meeting due by: 12/29/17  BEST PRACTICE / DISPOSITION Level of Care:  ICU Primary Service:  PCCM Consultants: GI  Code Status:  Full Diet:  Tube feeds DVT Px:  Hep SQ, SCDs GI Px:  PPI daily Skin Integrity:  Good  Rocio Wolak C. Shan Levans, MD PGY-2, [facility] 12/27/2017 8:17 AM

## 2017-12-28 ENCOUNTER — Inpatient Hospital Stay (HOSPITAL_COMMUNITY): Payer: Medicare Other

## 2017-12-28 LAB — CBC
HEMATOCRIT: 24 % — AB (ref 36.0–46.0)
HEMOGLOBIN: 7.7 g/dL — AB (ref 12.0–15.0)
MCH: 29.4 pg (ref 26.0–34.0)
MCHC: 32.1 g/dL (ref 30.0–36.0)
MCV: 91.6 fL (ref 78.0–100.0)
Platelets: 121 10*3/uL — ABNORMAL LOW (ref 150–400)
RBC: 2.62 MIL/uL — ABNORMAL LOW (ref 3.87–5.11)
RDW: 15.3 % (ref 11.5–15.5)
WBC: 10.4 10*3/uL (ref 4.0–10.5)

## 2017-12-28 LAB — GLUCOSE, CAPILLARY
GLUCOSE-CAPILLARY: 108 mg/dL — AB (ref 70–99)
GLUCOSE-CAPILLARY: 109 mg/dL — AB (ref 70–99)
GLUCOSE-CAPILLARY: 98 mg/dL (ref 70–99)
GLUCOSE-CAPILLARY: 99 mg/dL (ref 70–99)
Glucose-Capillary: 94 mg/dL (ref 70–99)
Glucose-Capillary: 115 mg/dL — ABNORMAL HIGH (ref 70–99)

## 2017-12-28 LAB — BASIC METABOLIC PANEL
ANION GAP: 6 (ref 5–15)
BUN: 58 mg/dL — ABNORMAL HIGH (ref 8–23)
CALCIUM: 8.3 mg/dL — AB (ref 8.9–10.3)
CHLORIDE: 111 mmol/L (ref 98–111)
CO2: 35 mmol/L — AB (ref 22–32)
Creatinine, Ser: 1.14 mg/dL — ABNORMAL HIGH (ref 0.44–1.00)
GFR calc Af Amer: 49 mL/min — ABNORMAL LOW (ref >60)
GFR calc non Af Amer: 42 mL/min — ABNORMAL LOW (ref >60)
Glucose, Bld: 115 mg/dL — ABNORMAL HIGH (ref 70–99)
Potassium: 4 mmol/L (ref 3.5–5.1)
Sodium: 152 mmol/L — ABNORMAL HIGH (ref 135–145)

## 2017-12-28 LAB — BODY FLUID CULTURE: Culture: NO GROWTH

## 2017-12-28 NOTE — Progress Notes (Signed)
PULMONARY / CRITICAL CARE MEDICINE   Name: Brittney Li MRN: [id number] DOB: 1929/10/06    ADMISSION DATE:  12/26/2017 CONSULTATION DATE:  12/22/17  REFERRING MD:  Dr. Dyann Kief [facility])  CHIEF COMPLAINT:  Acute Hypoxic Respiratory Failure, Hemoptysis  HISTORY OF PRESENT ILLNESS:   Brittney Li is a 84 y.o. Female with a PMH of chronic respiratory failure requiring 3L home H8,NIDP,OEUMPN, Diastolic CHF, Dementia who presented to [facility] ED on 7/23 with c/o progressive Shortness of breath and cough for several days.  Initial workup at [facility] was concerning for CHF exacerbation and RUL PNA.  Pt was recently treated for CHF and discharged to nursing facility on 12/17/17. On 7/24 while at [facility], pt with hypoxia and suffered respiratory arrest in the setting of hematemesis vs hemoptysis, necessitating intubation.   Pt transferred to [facility] later in afternoon on 12/22/17 for further management of Acute on Chronic Hypoxic Respiratory failure requiring intubation and Hemoptysis. PCCM is asked to admit the pt.  PAST MEDICAL HISTORY :  She  has a past medical history of Arthritis, Asthma, Bronchitis, CHF (congestive heart failure) ([place]), COPD (chronic obstructive pulmonary disease) ([place]), Dementia, Diabetes mellitus without complication ([place]), Hyperkalemia, Hypertension, Renal disorder, and Respiratory failure ([place]).  PAST SURGICAL HISTORY: She  has a past surgical history that includes hands and Abdominal hysterectomy (1975).  Allergies  Allergen Reactions  . Erythromycin Other (See Comments)    Unknown   . Fluviral [Flu Virus Vaccine]   . Penicillins Other (See Comments)    .Has patient had a PCN reaction causing immediate rash, facial/tongue/throat swelling, SOB or lightheadedness with hypotension: Unknown Has patient had a PCN reaction causing severe rash involving mucus membranes or skin necrosis:No Has patient had a PCN reaction that required hospitalization: Yes Has patient had  a PCN reaction occurring within the last 10 years: No If all of the above answers are "NO", then may proceed with Cephalosporin use.     No current facility-administered medications on file prior to encounter.    Current Outpatient Medications on File Prior to Encounter  Medication Sig  . albuterol (PROVENTIL) (2.5 MG/3ML) 0.083% nebulizer solution Take 2.5 mg by nebulization every 6 (six) hours as needed for wheezing.  Marland Kitchen aspirin 81 MG tablet Take 81 mg by mouth daily.    Marland Kitchen atorvastatin (LIPITOR) 20 MG tablet Take 20 mg by mouth daily at 6 PM.  . dextromethorphan (DELSYM) 30 MG/5ML liquid Take 5 mLs (30 mg total) by mouth 2 (two) times daily as needed for cough.  . docusate sodium (COLACE) 100 MG capsule Take 100 mg by mouth daily as needed for mild constipation or moderate constipation.   Marland Kitchen donepezil (ARICEPT) 10 MG tablet Take 10 mg by mouth at bedtime.  . famotidine (PEPCID) 20 MG tablet Take 20 mg by mouth daily.  . mirtazapine (REMERON) 30 MG tablet Take 30 mg by mouth at bedtime.  . mometasone-formoterol (DULERA) 200-5 MCG/ACT AERO Inhale 2 puffs into the lungs 2 (two) times daily.  . Multiple Vitamin (MULTIVITAMIN WITH MINERALS) TABS tablet Take 1 tablet by mouth daily.  Marland Kitchen nystatin (MYCOSTATIN) 100000 UNIT/ML suspension Use as directed 5 mLs in the mouth or throat 4 (four) times daily. 7 DAY COURSE TO END ON 12/27/2017  . oxybutynin (DITROPAN-XL) 5 MG 24 hr tablet Take 5 mg by mouth every evening.  . potassium chloride SA (K-DUR,KLOR-CON) 20 MEQ tablet Take 2 tablets (40 mEq total) by mouth daily.  Marland Kitchen torsemide (DEMADEX) 20 MG  tablet TAKE 40 MG twice daily (Patient taking differently: Take 60 mg by mouth 2 (two) times daily. )    FAMILY HISTORY:  Her family history includes Arthritis in her unknown relative; Asthma in her unknown relative; Diabetes in her unknown relative.  SOCIAL HISTORY: She  reports that she has quit smoking. She has never used smokeless tobacco. She reports that  she does not drink alcohol or use drugs.  REVIEW OF SYSTEMS:   Unable to obtain due to intubation  SUBJECTIVE:  No complaints  VITAL SIGNS: BP (!) 111/59   Pulse 69   Temp 100 F (37.8 C)   Resp (!) 22   Ht _0  (1.626 m)   Wt 194 lb 3.6 oz (88.1 kg)   SpO2 95%   BMI 33.34 kg/m   HEMODYNAMICS: CVP:  [11 mmHg-18 mmHg] 11 mmHg  VENTILATOR SETTINGS: Vent Mode: PSV;CPAP FiO2 (%):  [40 %] 40 % Set Rate:  [14 bmp] 14 bmp Vt Set:  [400 mL] 400 mL PEEP:  [5 cmH20] 5 cmH20 Pressure Support:  [8 cmH20-10 cmH20] 8 cmH20 Plateau Pressure:  [17 cmH20-21 cmH20] 17 cmH20  INTAKE / OUTPUT: I/O last 3 completed shifts: In: 5673.4 [P.O.:120; I.V.:682.5; NK/NL:9767.3; IV Piggyback:911.5] Out: 2670 [Urine:2670]  PHYSICAL EXAMINATION: General:  Acute on chronically ill, elderly female, intubated, alert, calm Neuro:  Awake and alert, opens eyes to voice Cardiovascular: Regular rhythm, normal rate, no MRG Lungs:  Crackles bilaterally, R>L, no wheezing, normal WOB Abdomen:  Soft, BS+ x4, nontender to palpation Musculoskeletal:  No deformities, 1+ bilateral LE edema, tender to palpation of L hip Skin:  No obvious rashes, lesions, or ulcerations  LABS:  BMET Recent Labs  Lab 12/27/17 0207 12/27/17 2223 12/28/17 0500  NA 152* 152* 152*  K 3.4* 3.4* 4.0  CL 110 109 111  CO2 34* 37* 35*  BUN 63* 60* 58*  CREATININE 1.26* 1.14* 1.14*  GLUCOSE 132* 112* 115*    Electrolytes Recent Labs  Lab 12/24/17 1653 12/25/17 0245 12/26/17 0400 12/27/17 0207 12/27/17 2223 12/28/17 0500  CALCIUM  --  8.5* 8.3* 8.1* 8.2* 8.3*  MG 2.3 2.3 2.4  --  2.4  --   PHOS 3.1 3.1 2.7  --   --   --     CBC Recent Labs  Lab 12/26/17 0400 12/27/17 0207 12/28/17 0500  WBC 8.8 9.4 10.4  HGB 8.0* 7.3* 7.7*  HCT 24.3* 22.4* 24.0*  PLT 89* 90* 121*    Coag's Recent Labs  Lab 12/22/17 1620  APTT 28  INR 1.30    Sepsis Markers Recent Labs  Lab 12/23/2017 1556 12/22/17 1620  12/23/17 0136 12/24/17 0320  LATICACIDVEN 1.66  --   --   --   PROCALCITON  --  <0.10 0.14 0.21    ABG Recent Labs  Lab 12/23/17 1235 12/26/17 0435 12/26/17 1415  PHART 7.483* 7.482* 7.466*  PCO2ART 52.4* 52.0* 51.1*  PO2ART 93.9 78.2* 72.6*    Liver Enzymes Recent Labs  Lab 12/09/2017 1543  AST 40  ALT 30  ALKPHOS 84  BILITOT 0.8  ALBUMIN 3.2*    Cardiac Enzymes Recent Labs  Lab 12/22/17 1650 12/23/17 0136 12/23/17 0544  TROPONINI 0.35* 0.44* 0.48*    Glucose Recent Labs  Lab 12/27/17 1117 12/27/17 1517 12/27/17 1923 12/27/17 2317 12/28/17 0334 12/28/17 0719  GLUCAP 115* 114* 106* 97 99 94    Imaging Dg Chest Portable 1 View  Result Date: 12/28/2017 CLINICAL DATA:  Follow-up of pneumonia EXAM:  PORTABLE CHEST 1 VIEW COMPARISON:  Chest x-ray of December 27, 2017 FINDINGS: The lungs are well-expanded. There is interstitial and alveolar opacity in the right upper lobe with some similar interstitial density in the right lower lobe. There is a small right pleural effusion more conspicuous today. The left lung is relatively clear though the retrocardiac region is dense. The heart is top-normal in size. The pulmonary vascularity is normal. There is calcification in the wall of the aortic arch. The endotracheal tube tip projects 4.2 cm above the carina. The NG tube tip in proximal port appear to lie below the GE junction in the gastric cardia and proximal body. The left-sided PICC line tip projects over the midportion of the SVC. IMPRESSION: Worsening in the appearance of the pulmonary interstitium on the right consistent with progressive pneumonia. Increased volume of pleural fluid on the right. Persistent increased density in the left retrocardiac region. The support tubes are in reasonable position. Thoracic aortic atherosclerosis. Electronically Signed   By: David  Martinique M.D.   On: 12/28/2017 09:35     STUDIES:  CT Chest w/ Contrast 7/24>> Bronchoscopy 7/24>> C diff  7/28>> negative  CULTURES: Blood x2 7/23>> NG5D MRSA PCR 7/23>> Negative Strep Pneumo urinary antigen 7/24>> negative Legionella urinary antigen 7/24>> BAL 7/24>> normal respiratory flora Urine culture 7/24>>NG Pleural fluid culture 7/26 >> NG1D  ANTIBIOTICS: Aztreonam 7/23>> 7/26 Vancomycin 7/23>> 7/26 Ceftaz 7/26 >>  SIGNIFICANT EVENTS: 12/04/2017>> Presented to [facility] 12/22/17>> Intubated, hemoptysis, transfer to [facility] 12/24/17>> Thoracentesis showing transudative fluid 12/26/17>> weaned further on vent 12/27/17>> Required addition of neosynephrine for MAP<60 unresponsive to fluid bolus  LINES/TUBES: ETT 7/24>> Rt radial A line 7/24 >  R PICC 7/27>  DISCUSSION: 84 y.o. Female with Acute on Chronic Hypoxic Respiratory failure requiring intubation in setting of CHF exacerbation vs R PNA (aspiration) vs COPD exacerbation, and hemoptysis vs hematemesis  ASSESSMENT / PLAN:  PULMONARY A: Acute on Chronic Hypoxic Respiratory Failure, improving PNA CXR with increased infiltrates on 7/30, likely due to increase in fluid balance Hx: COPD, Asthma P:   SBT today, may be able to extubate soon Follow chest x-ray  CARDIOVASCULAR A:  Acute on Chronic CHF (diastolic) Hypotensive shock overnight and on 7/30, requiring addition of neosynephrine; shock could be due to worsening infection BP possibly inaccurate given vasculopathy and edema, wide range of pressures Hx: CHF (diastolic), HTN Echo noted with grade 2 diastolic dysfunction, high ventricular filling pressure, pulm HTN BNP 1,977 P:  Continue free water Wean neosynephrine as able for goal MAP>65 May need to replace art line if BP measurements are considered inaccurate later today  RENAL A:   AKI, improving, creatinine 1.14 on 7/30 Hypernatremia Mild hypokalemia Free water deficit 3.4 L P:   Follow urine output and Cr Continue free water to 400 ml Q4H K repleted  GASTROINTESTINAL A:   Rule out GI Bleed - GI thinks  bleeding likely esophagitis or traumatic intubation, will not plan on endoscopy, agrees with PPI Diarrhea, C diff negative P:   Tube feeds PPI daily  HEMATOLOGIC A:   Anemia, likely combined Fe deficiency and anemia of chronic disease, Hg 7.7 Thrombocytopenia, stable at 121 Small amount of bleeding from respiratory tract and GI tract P:  Monitor for s/sx of bleeding Trend CBC SCD's for VTE prophylaxis (given hemoptysis) Transfuse for Hgb<7 Transfuse for Plt<50 and active bleeding  INFECTIOUS A:   Increased opacity RUL on CXR, possible aspiration PNA Mild fever overnight to 100.4 P:   Abx  narrowed to ceftaz. Follow cultures  ENDOCRINE A:   Hx: DM II   P:   SSI coverage Follow ICU hypo/hyperglycemia protocol  NEUROLOGIC A:   Sedation needs in setting of mechanical ventilation Hx: Dementia P:   RASS goal: 0 to -1 Prn fentanyl and versed pushes maintain RASS goal Continue Aricept, Remeron  FAMILY  - Updates: No family at bedside - Inter-disciplinary family meet or Palliative Care meeting due by: 12/29/17  BEST PRACTICE / DISPOSITION Level of Care:  ICU Primary Service:  PCCM Consultants: GI  Code Status:  Full Diet:  Tube feeds DVT Px:  Hep SQ, SCDs GI Px:  PPI daily Skin Integrity:  Good  Sherlynn Tourville C. Shan Levans, MD PGY-2, [facility] 12/28/2017 9:57 AM

## 2017-12-29 ENCOUNTER — Inpatient Hospital Stay (HOSPITAL_COMMUNITY): Payer: Medicare Other

## 2017-12-29 LAB — GLUCOSE, CAPILLARY
GLUCOSE-CAPILLARY: 109 mg/dL — AB (ref 70–99)
GLUCOSE-CAPILLARY: 119 mg/dL — AB (ref 70–99)
Glucose-Capillary: 102 mg/dL — ABNORMAL HIGH (ref 70–99)
Glucose-Capillary: 127 mg/dL — ABNORMAL HIGH (ref 70–99)
Glucose-Capillary: 103 mg/dL — ABNORMAL HIGH (ref 70–99)
Glucose-Capillary: 91 mg/dL (ref 70–99)

## 2017-12-29 LAB — BASIC METABOLIC PANEL
ANION GAP: 6 (ref 5–15)
BUN: 56 mg/dL — ABNORMAL HIGH (ref 8–23)
CHLORIDE: 108 mmol/L (ref 98–111)
CO2: 36 mmol/L — AB (ref 22–32)
CREATININE: 1.02 mg/dL — AB (ref 0.44–1.00)
Calcium: 8.1 mg/dL — ABNORMAL LOW (ref 8.9–10.3)
GFR calc Af Amer: 56 mL/min — ABNORMAL LOW (ref >60)
GFR calc non Af Amer: 48 mL/min — ABNORMAL LOW (ref >60)
Glucose, Bld: 107 mg/dL — ABNORMAL HIGH (ref 70–99)
POTASSIUM: 3.4 mmol/L — AB (ref 3.5–5.1)
Sodium: 150 mmol/L — ABNORMAL HIGH (ref 135–145)

## 2017-12-29 LAB — CBC
HEMATOCRIT: 22.5 % — AB (ref 36.0–46.0)
HEMOGLOBIN: 7.4 g/dL — AB (ref 12.0–15.0)
MCH: 29.7 pg (ref 26.0–34.0)
MCHC: 32.9 g/dL (ref 30.0–36.0)
MCV: 90.4 fL (ref 78.0–100.0)
Platelets: 131 10*3/uL — ABNORMAL LOW (ref 150–400)
RBC: 2.49 MIL/uL — AB (ref 3.87–5.11)
RDW: 15.3 % (ref 11.5–15.5)
WBC: 10.6 10*3/uL — AB (ref 4.0–10.5)

## 2017-12-29 LAB — PROTIME-INR
INR: 1.2
PROTHROMBIN TIME: 15.1 s (ref 11.4–15.2)

## 2017-12-29 MED ORDER — POTASSIUM CHLORIDE 20 MEQ/15ML (10%) PO SOLN
20.0000 meq | ORAL | Status: AC
  Administered 2017-12-29 (×2): 20 meq
  Filled 2017-12-29 (×2): qty 15

## 2017-12-29 MED ORDER — FUROSEMIDE 10 MG/ML IJ SOLN
INTRAMUSCULAR | Status: AC
  Filled 2017-12-29: qty 4

## 2017-12-29 MED ORDER — ORAL CARE MOUTH RINSE
15.0000 mL | Freq: Two times a day (BID) | OROMUCOSAL | Status: DC
  Administered 2017-12-29 – 2018-01-01 (×8): 15 mL via OROMUCOSAL

## 2017-12-29 MED ORDER — CHLORHEXIDINE GLUCONATE 0.12 % MT SOLN
15.0000 mL | Freq: Two times a day (BID) | OROMUCOSAL | Status: DC
  Administered 2017-12-30 – 2018-01-02 (×7): 15 mL via OROMUCOSAL
  Filled 2017-12-29 (×7): qty 15

## 2017-12-29 MED ORDER — FUROSEMIDE 10 MG/ML IJ SOLN
40.0000 mg | Freq: Four times a day (QID) | INTRAMUSCULAR | Status: AC
  Administered 2017-12-29 (×2): 40 mg via INTRAVENOUS
  Filled 2017-12-29: qty 4

## 2017-12-29 NOTE — Progress Notes (Signed)
Attending:    Subjective: Baseline COPD had respiratory failure from pneumonia requiring intubation.  She was noted to have hemoptysis so a bronchoscopy was performed which was unrevealing.  She has not been able to be liberated from the ventilator due to generalized weakness and excessive respiratory secretions.  She has been vasopressor dependent for a few days again, source uncertain (overdiuresis?).  Remains mechanically ventilated.  Had a thoracentesis last week> about 1 liter removed.   Objective: Vitals:   12/29/17 0743 12/29/17 0744 12/29/17 0745 12/29/17 0800  BP: 99/78  99/78 (!) 88/60  Pulse: 72  73 77  Resp: (!) 22  (!) 22 18  Temp:    100.2 F (37.9 C)  TempSrc:      SpO2: 95% 95% 94% 92%  Weight:      Height:       Vent Mode: PSV;CPAP FiO2 (%):  [40 %] 40 % Set Rate:  [14 bmp] 14 bmp Vt Set:  [400 mL] 400 mL PEEP:  [5 cmH20] 5 cmH20 Pressure Support:  [8 cmH20] 8 cmH20 Plateau Pressure:  [18 cmH20-20 cmH20] 19 cmH20  Intake/Output Summary (Last 24 hours) at 12/29/2017 1031 Last data filed at 12/29/2017 0932 Gross per 24 hour  Intake 2495.2 ml  Output 1300 ml  Net 1195.2 ml    General:  In bed on vent HENT: NCAT ETT in place PULM: CTA B, vent supported breathing CV: RRR, no mgr GI: BS+, soft, nontender Derm: massive edema arms/legs Neuro: sedated on vent     CBC    Component Value Date/Time   WBC 10.6 (H) 12/29/2017 0403   RBC 2.49 (L) 12/29/2017 0403   HGB 7.4 (L) 12/29/2017 0403   HCT 22.5 (L) 12/29/2017 0403   PLT 131 (L) 12/29/2017 0403   MCV 90.4 12/29/2017 0403   MCH 29.7 12/29/2017 0403   MCHC 32.9 12/29/2017 0403   RDW 15.3 12/29/2017 0403   LYMPHSABS 0.7 2018-03-05 1543   MONOABS 0.5 2018-03-05 1543   EOSABS 0.0 2018-03-05 1543   BASOSABS 0.0 2018-03-05 1543    BMET    Component Value Date/Time   NA 150 (H) 12/29/2017 0403   K 3.4 (L) 12/29/2017 0403   CL 108 12/29/2017 0403   CO2 36 (H) 12/29/2017 0403   GLUCOSE 107 (H)  12/29/2017 0403   BUN 56 (H) 12/29/2017 0403   CREATININE 1.02 (H) 12/29/2017 0403   CREATININE 1.30 (H) 09/23/2015 1223   CALCIUM 8.1 (L) 12/29/2017 0403   GFRNONAA 48 (L) 12/29/2017 0403   GFRAA 56 (L) 12/29/2017 0403   Echo: LVEF 55-60%, PA pressure est 57mm Hg, dilated RV, grade 2 diastolic dysfunction CXR images dense RUL consolidation, emphysema, ett in place  Impression/Plan: Acute respiratory failure with hypoxemia: has been marginal but close to extubation over the last few days, will attempt extubation today Pulmonary hypertension with massive volume overload: unclear cause, will diurese HCAP: continue antibiotics Shock: remains on low dose neosynephrine, but appears to be well perfused Hypernatremia: continue free water replacement  Bleeding> I wonder about a pharyngeal bleed given blood from NG tube and from ETT and bronch was negative, monitor for bleeding, transfuse for Hgb < 7gm/dL  My cc time 32 minutes  Heber CarolinaBrent Fedra Lanter, MD [place] [facility] Pager: [phone] Cell: [phone] After 3pm or if no response, call [phone]

## 2017-12-29 NOTE — Progress Notes (Signed)
Occupational Therapy Treatment Patient Details Name: Brittney Li MRN: [id number] DOB: 16-Dec-1929 Today's Date: 12/29/2017    History of present illness Pt is an 84 y.o. female admitted [facility] SNF to [facility] on 12/20/2017 with worsening SOB and cough; initial workup concerning for RUL pneumonia and CHF exacerbation. Sufferred respiratory arrest in setting of hematemesis vs hemoptysis; ETT 7/24. Extubated 12/29/17. PMH includes dementia, CHF, chronic respiratory failure, COPD (3L home O2), asthma.   OT comments   Pt extubated this date and currently on 4lnc. Pt appeared anxious with discussion of transferring to chair, stating she "feels so weak". Pt declined OOB mobility this date, but agreeable to attempt OOB mobility tomorrow. Pt sat EOB with minguard for 5 min to complete grooming after set-up. Pt SpO2 85%-94% during session with poor waveform. Will continue to see acutely. D/c venue remains appropriate.                     BP                Supine    117/53        Sitting    140/98        Sitting    155/96 Return to supine   130/72   Follow Up Recommendations  SNF;Supervision/Assistance - 24 hour    Equipment Recommendations  Other (comment)(TBD d/c venue)    Recommendations for Other Services      Precautions / Restrictions Precautions Precautions: Fall Restrictions Weight Bearing Restrictions: No       Mobility Bed Mobility Overal bed mobility: Needs Assistance Bed Mobility: Sit to Supine;Supine to Sit     Supine to sit: Mod assist Sit to supine: Max assist   General bed mobility comments: pt able to progress to EOB with modA LB management;pt required maxA to recline trunk and bring BLE up on bed  Transfers Overall transfer level: Needs assistance               General transfer comment: pt declined OOB mobility     Balance Overall balance assessment: Needs assistance Sitting-balance support: Single extremity supported;Feet supported Sitting  balance-Leahy Scale: Fair         Standing balance comment: pt declined OOB mobiltiy                            ADL either performed or assessed with clinical judgement   ADL Overall ADL's : Needs assistance/impaired Eating/Feeding: Set up Eating/Feeding Details (indicate cue type and reason): pt scooped one ice cube out of cup using spoon and brought it to her mouth without dropping it Grooming: Sitting;Min guard;Wash/dry face   Upper Body Bathing: Sitting;Min Engineer, petroleum Details (indicate cue type and reason): pt declined OOB mobiltiy         Functional mobility during ADLs: Maximal assistance;+2 for safety/equipment General ADL Comments: pt declined OOB mobility;pt sat EOB for about 5 min and completed ADL after setup assistance;     Vision       Perception     Praxis      Cognition Arousal/Alertness: Awake/alert Behavior During Therapy: Anxious Overall Cognitive Status: No family/caregiver present to determine baseline cognitive functioning  General Comments: Following commands appropriately with increased time;pt appeared anxious with mobility and with discussion about use of maxisky        Exercises     Shoulder Instructions       General Comments pt coughed up dark colored secretions after sitting EOB    Pertinent Vitals/ Pain       Pain Assessment: No/denies pain Pain Intervention(s): Monitored during session  Home Living                                          Prior Functioning/Environment              Frequency  Min 2X/week        Progress Toward Goals  OT Goals(current goals can now be found in the care plan section)  Progress towards OT goals: Progressing toward goals  Acute Rehab OT Goals Patient Stated Goal: to get up next session  Time For Goal Achievement: 01/10/18 Potential to Achieve Goals: Good ADL Goals Pt  Will Perform Grooming: with set-up;sitting Pt Will Perform Lower Body Dressing: sit to/from stand;with min guard assist Pt Will Transfer to Toilet: with min guard assist;ambulating Pt/caregiver will Perform Home Exercise Program: Increased ROM;Increased strength;Both right and left upper extremity;Independently;With written HEP provided  Plan Discharge plan remains appropriate    Co-evaluation    PT/OT/SLP Co-Evaluation/Treatment: Yes Reason for Co-Treatment: Complexity of the patient's impairments (multi-system involvement);For patient/therapist safety;To address functional/ADL transfers PT goals addressed during session: Mobility/safety with mobility OT goals addressed during session: ADL's and self-care      AM-PAC PT "6 Clicks" Daily Activity     Outcome Measure   Help from another person eating meals?: A Little Help from another person taking care of personal grooming?: A Little Help from another person toileting, which includes using toliet, bedpan, or urinal?: Total Help from another person bathing (including washing, rinsing, drying)?: A Lot Help from another person to put on and taking off regular upper body clothing?: A Lot Help from another person to put on and taking off regular lower body clothing?: A Lot 6 Click Score: 13    End of Session Equipment Utilized During Treatment: Gait belt;Oxygen  OT Visit Diagnosis: Unsteadiness on feet (R26.81);Other abnormalities of gait and mobility (R26.89);Pain   Activity Tolerance Patient tolerated treatment well   Patient Left in bed;with call bell/phone within reach(in chair position)   Nurse Communication Mobility status        Time: [id number] OT Time Calculation (min): 39 min  Charges:    Diona Brownereresa Thompson OTS     Diona Brownereresa Thompson 12/29/2017, 4:16 PM

## 2017-12-29 NOTE — Progress Notes (Signed)
OT Cancellation Note  Patient Details Name: Brittney Li MRN: [id number] DOB: 02/21/1930   Cancelled Treatment:    Reason Eval/Treat Not Completed: Other (comment). Per nursing, planning to extubate this am. Will return at later time as able.   Thornell MuleWARD,HILLARY  Bonita Brindisi, OT/L  OT Clinical Specialist [phone]  12/29/2017, 11:11 AM

## 2017-12-29 NOTE — Progress Notes (Signed)
[facility] LAB REPLACEMENT ONLY  The patient does not apply for the [facility] Protocol based on the criteria listed below:   1. Is GFR >/= 40 ml/min? Yes.    Patient's GFR today is 48 2. Is urine output >/= 0.5 ml/kg/hr for the last 6 hours? Yes.   Patient's UOP is 0.5 ml/kg/hr 3. Is BUN < 60 mg/dL? Yes.    Patient's BUN today is 56 4. Abnormal electrolyte K 3.4 5. Ordered repletion with: per protocol 6. If a panic level lab has been reported, has the CCM MD in charge been notified? Yes.  .   Physician:  Brittney Li  [name] 12/29/2017 5:26 AM

## 2017-12-29 NOTE — Progress Notes (Signed)
PULMONARY / CRITICAL CARE MEDICINE   Name: Brittney Li MRN: [id number] DOB: 1929/10/06    ADMISSION DATE:  12/26/2017 CONSULTATION DATE:  12/22/17  REFERRING MD:  Dr. Dyann Kief [facility])  CHIEF COMPLAINT:  Acute Hypoxic Respiratory Failure, Hemoptysis  HISTORY OF PRESENT ILLNESS:   Brittney Li is a 84 y.o. Female with a PMH of chronic respiratory failure requiring 3L home H8,NIDP,OEUMPN, Diastolic CHF, Dementia who presented to [facility] ED on 7/23 with c/o progressive Shortness of breath and cough for several days.  Initial workup at [facility] was concerning for CHF exacerbation and RUL PNA.  Pt was recently treated for CHF and discharged to nursing facility on 12/17/17. On 7/24 while at [facility], pt with hypoxia and suffered respiratory arrest in the setting of hematemesis vs hemoptysis, necessitating intubation.   Pt transferred to [facility] later in afternoon on 12/22/17 for further management of Acute on Chronic Hypoxic Respiratory failure requiring intubation and Hemoptysis. PCCM is asked to admit the pt.  PAST MEDICAL HISTORY :  She  has a past medical history of Arthritis, Asthma, Bronchitis, CHF (congestive heart failure) ([place]), COPD (chronic obstructive pulmonary disease) ([place]), Dementia, Diabetes mellitus without complication ([place]), Hyperkalemia, Hypertension, Renal disorder, and Respiratory failure ([place]).  PAST SURGICAL HISTORY: She  has a past surgical history that includes hands and Abdominal hysterectomy (1975).  Allergies  Allergen Reactions  . Erythromycin Other (See Comments)    Unknown   . Fluviral [Flu Virus Vaccine]   . Penicillins Other (See Comments)    .Has patient had a PCN reaction causing immediate rash, facial/tongue/throat swelling, SOB or lightheadedness with hypotension: Unknown Has patient had a PCN reaction causing severe rash involving mucus membranes or skin necrosis:No Has patient had a PCN reaction that required hospitalization: Yes Has patient had  a PCN reaction occurring within the last 10 years: No If all of the above answers are "NO", then may proceed with Cephalosporin use.     No current facility-administered medications on file prior to encounter.    Current Outpatient Medications on File Prior to Encounter  Medication Sig  . albuterol (PROVENTIL) (2.5 MG/3ML) 0.083% nebulizer solution Take 2.5 mg by nebulization every 6 (six) hours as needed for wheezing.  Marland Kitchen aspirin 81 MG tablet Take 81 mg by mouth daily.    Marland Kitchen atorvastatin (LIPITOR) 20 MG tablet Take 20 mg by mouth daily at 6 PM.  . dextromethorphan (DELSYM) 30 MG/5ML liquid Take 5 mLs (30 mg total) by mouth 2 (two) times daily as needed for cough.  . docusate sodium (COLACE) 100 MG capsule Take 100 mg by mouth daily as needed for mild constipation or moderate constipation.   Marland Kitchen donepezil (ARICEPT) 10 MG tablet Take 10 mg by mouth at bedtime.  . famotidine (PEPCID) 20 MG tablet Take 20 mg by mouth daily.  . mirtazapine (REMERON) 30 MG tablet Take 30 mg by mouth at bedtime.  . mometasone-formoterol (DULERA) 200-5 MCG/ACT AERO Inhale 2 puffs into the lungs 2 (two) times daily.  . Multiple Vitamin (MULTIVITAMIN WITH MINERALS) TABS tablet Take 1 tablet by mouth daily.  Marland Kitchen nystatin (MYCOSTATIN) 100000 UNIT/ML suspension Use as directed 5 mLs in the mouth or throat 4 (four) times daily. 7 DAY COURSE TO END ON 12/27/2017  . oxybutynin (DITROPAN-XL) 5 MG 24 hr tablet Take 5 mg by mouth every evening.  . potassium chloride SA (K-DUR,KLOR-CON) 20 MEQ tablet Take 2 tablets (40 mEq total) by mouth daily.  Marland Kitchen torsemide (DEMADEX) 20 MG  tablet TAKE 40 MG twice daily (Patient taking differently: Take 60 mg by mouth 2 (two) times daily. )    FAMILY HISTORY:  Her family history includes Arthritis in her unknown relative; Asthma in her unknown relative; Diabetes in her unknown relative.  SOCIAL HISTORY: She  reports that she has quit smoking. She has never used smokeless tobacco. She reports that  she does not drink alcohol or use drugs.  REVIEW OF SYSTEMS:   Unable to obtain due to intubation  SUBJECTIVE:  No complaints  VITAL SIGNS: BP 132/68   Pulse 74   Temp (!) 100.4 F (38 C)   Resp 17   Ht _0  (1.626 m)   Wt 201 lb 15.1 oz (91.6 kg)   SpO2 93%   BMI 34.66 kg/m   HEMODYNAMICS: CVP:  [9 mmHg-26 mmHg] 9 mmHg  VENTILATOR SETTINGS: Vent Mode: PRVC FiO2 (%):  [40 %] 40 % Set Rate:  [14 bmp] 14 bmp Vt Set:  [400 mL] 400 mL PEEP:  [5 cmH20] 5 cmH20 Pressure Support:  [8 cmH20] 8 cmH20 Plateau Pressure:  [18 cmH20-20 cmH20] 20 cmH20  INTAKE / OUTPUT: I/O last 3 completed shifts: In: 5041.5 [I.V.:1371.5; NG/GT:3320; IV Piggyback:350] Out: 2275 [Urine:2275]  PHYSICAL EXAMINATION: General:  Acute on chronically ill, elderly female, intubated, alert, calm Neuro:  Awake and alert, opens eyes to voice, follows commands Cardiovascular: Regular rhythm, normal rate, no MRG Lungs:  Crackles bilaterally, R>L, no wheezing, normal WOB Abdomen:  Soft, BS+ x4, nontender to palpation Musculoskeletal:  No deformities, 1+ bilateral LE edema Skin:  Some erosions on edematous ankles  LABS:  BMET Recent Labs  Lab 12/27/17 2223 12/28/17 0500 12/29/17 0403  NA 152* 152* 150*  K 3.4* 4.0 3.4*  CL 109 111 108  CO2 37* 35* 36*  BUN 60* 58* 56*  CREATININE 1.14* 1.14* 1.02*  GLUCOSE 112* 115* 107*    Electrolytes Recent Labs  Lab 12/24/17 1653 12/25/17 0245 12/26/17 0400  12/27/17 2223 12/28/17 0500 12/29/17 0403  CALCIUM  --  8.5* 8.3*   < > 8.2* 8.3* 8.1*  MG 2.3 2.3 2.4  --  2.4  --   --   PHOS 3.1 3.1 2.7  --   --   --   --    < > = values in this interval not displayed.    CBC Recent Labs  Lab 12/27/17 0207 12/28/17 0500 12/29/17 0403  WBC 9.4 10.4 10.6*  HGB 7.3* 7.7* 7.4*  HCT 22.4* 24.0* 22.5*  PLT 90* 121* 131*    Coag's Recent Labs  Lab 12/22/17 1620  APTT 28  INR 1.30    Sepsis Markers Recent Labs  Lab 12/22/17 1620  12/23/17 0136 12/24/17 0320  PROCALCITON <0.10 0.14 0.21    ABG Recent Labs  Lab 12/23/17 1235 12/26/17 0435 12/26/17 1415  PHART 7.483* 7.482* 7.466*  PCO2ART 52.4* 52.0* 51.1*  PO2ART 93.9 78.2* 72.6*    Liver Enzymes No results for input(s): AST, ALT, ALKPHOS, BILITOT, ALBUMIN in the last 168 hours.  Cardiac Enzymes Recent Labs  Lab 12/22/17 1650 12/23/17 0136 12/23/17 0544  TROPONINI 0.35* 0.44* 0.48*    Glucose Recent Labs  Lab 12/28/17 1056 12/28/17 1533 12/28/17 1929 12/28/17 2339 12/29/17 0341 12/29/17 0717  GLUCAP 115* 108* 109* 98 102* 109*    Imaging Dg Chest Portable 1 View  Result Date: 12/28/2017 CLINICAL DATA:  Follow-up of pneumonia EXAM: PORTABLE CHEST 1 VIEW COMPARISON:  Chest x-ray of December 27, 2017  FINDINGS: The lungs are well-expanded. There is interstitial and alveolar opacity in the right upper lobe with some similar interstitial density in the right lower lobe. There is a small right pleural effusion more conspicuous today. The left lung is relatively clear though the retrocardiac region is dense. The heart is top-normal in size. The pulmonary vascularity is normal. There is calcification in the wall of the aortic arch. The endotracheal tube tip projects 4.2 cm above the carina. The NG tube tip in proximal port appear to lie below the GE junction in the gastric cardia and proximal body. The left-sided PICC line tip projects over the midportion of the SVC. IMPRESSION: Worsening in the appearance of the pulmonary interstitium on the right consistent with progressive pneumonia. Increased volume of pleural fluid on the right. Persistent increased density in the left retrocardiac region. The support tubes are in reasonable position. Thoracic aortic atherosclerosis. Electronically Signed   By: David  Martinique M.D.   On: 12/28/2017 09:35     STUDIES:  CT Chest w/ Contrast 7/24>> Bronchoscopy 7/24>> C diff 7/28>> negative  CULTURES: Blood x2 7/23>>  NGTD MRSA PCR 7/23>> Negative Strep Pneumo urinary antigen 7/24>> negative Legionella urinary antigen 7/24>> BAL 7/24>> normal respiratory flora Urine culture 7/24>>NG Pleural fluid culture 7/26 >> NGTD  ANTIBIOTICS: Aztreonam 7/23>> 7/26 Vancomycin 7/23>> 7/26 Ceftaz 7/26 >> 7/30  SIGNIFICANT EVENTS: 12/22/2017>> Presented to [facility] 12/22/17>> Intubated, hemoptysis, transfer to [facility] 12/24/17>> Thoracentesis showing transudative fluid 12/26/17>> weaned further on vent 12/27/17>> Required addition of neosynephrine for MAP<60 unresponsive to fluid bolus 12/28/17>> Bloody secretions from ETT and OGT  LINES/TUBES: ETT 7/24>> Rt radial A line 7/24 >  R PICC 7/27>  DISCUSSION: 84 y.o. Female with Acute on Chronic Hypoxic Respiratory failure requiring intubation in setting of CHF exacerbation vs R PNA (aspiration) vs COPD exacerbation, and hemoptysis vs hematemesis  ASSESSMENT / PLAN:  PULMONARY A: Acute on Chronic Hypoxic Respiratory Failure, improving PNA CXR with increased infiltrates on 7/30, likely due to increase in fluid balance Hx: COPD, Asthma P:   Extubate today Lasix  CARDIOVASCULAR A:  Acute on Chronic CHF (diastolic) Continued shock, requiring neosynephrine, dose on 7/31 22 mcg/min; shock could be due to worsening infection vs CHF Hx: CHF (diastolic), HTN Echo noted with grade 2 diastolic dysfunction, high ventricular filling pressure, pulm HTN BNP 1,977 P:  Continue free water Wean neosynephrine as able for goal systolic >30  RENAL A:   AKI, improving, creatinine 1.02 on 7/31 Hypernatremia Mild hypokalemia Free water deficit 2.9 L P:   Follow urine output and Cr Continue free water 400 ml Q6H K repleted Lasix  GASTROINTESTINAL A:   Rule out GI Bleed - GI thinks bleeding likely esophagitis or traumatic intubation, will not plan on endoscopy, agrees with PPI Diarrhea, C diff negative P:   Tube feeds PPI daily  HEMATOLOGIC A:   Anemia, likely  combined Fe deficiency and anemia of chronic disease, Hg 7.4 Thrombocytopenia, stable at 131 Continued bleeding from respiratory tract and GI tract P:  Monitor for s/sx of bleeding Trend CBC SCD's for VTE prophylaxis (given hemoptysis) Transfuse for Hgb<7 Transfuse for Plt<50 and active bleeding  INFECTIOUS A:   Increased opacity RUL on CXR, possible aspiration PNA Mild fever overnight to 100.4 P:   Abx narrowed to ceftaz. Course completed on 7/30  ENDOCRINE A:   Hx: DM II   P:   SSI coverage Follow ICU hypo/hyperglycemia protocol  NEUROLOGIC A:   Sedation needs in setting of mechanical  ventilation Hx: Dementia P:   RASS goal: 0 to -1 Prn fentanyl and versed pushes maintain RASS goal Continue Aricept, Remeron  FAMILY  - Updates: No family at bedside - Inter-disciplinary family meet or Palliative Care meeting due by: 12/29/17  BEST PRACTICE / DISPOSITION Level of Care:  ICU Primary Service:  PCCM Consultants: GI  Code Status:  Full Diet:  Tube feeds DVT Px:  Hep SQ, SCDs GI Px:  PPI daily Skin Integrity:  Good  Monigue Spraggins C. Shan Levans, MD PGY-2, [facility] 12/29/2017 7:46 AM

## 2017-12-29 NOTE — Plan of Care (Signed)
Pt continues to have large amounts of oral and endotracheal secretions, follows commands, still weak, not on any current sedation , remains on low dose neo for bp, hopefully ot attempt to wean vent slowly.

## 2017-12-29 NOTE — Progress Notes (Signed)
eLink Physician-Brief Progress Note Patient Name: Charlton HawsMary A Behunin DOB: 11/26/1929 MRN: [id number]   Date of Service  12/29/2017  HPI/Events of Note  Bloody secretions  eICU Interventions  H/h stable Hep SQ stopped Primary team to follow up     Intervention Category Evaluation Type: Other  Diron Haddon 12/29/2017, 6:50 AM

## 2017-12-29 NOTE — Procedures (Signed)
Extubation Procedure Note  Patient Details:   Name: Brittney Li DOB: 09/06/1929 MRN: [id number]   Airway Documentation:   Positive cuff leak prior to extubation.  Vent end date: 12/29/17 Vent end time: 1145   Evaluation  O2 sats: stable throughout Complications: No apparent complications Patient did tolerate procedure well. Bilateral Breath Sounds: Clear, Diminished   Yes- pt whispered her name. Voice is hoarse.   Pt extubated to 4L [place] with RN at bedside. No signs of stridor or distress. VS stable throughout.   Brittney Li  Brittney Li 12/29/2017, 11:48 AM

## 2017-12-29 NOTE — Progress Notes (Signed)
OT Note - Addendum    12/29/17 1627  OT Visit Information  Last OT Received On 12/29/17  OT Time Calculation  OT Start Time (ACUTE ONLY) 1424  OT Stop Time (ACUTE ONLY) 1503  OT Time Calculation (min) 39 min  OT General Charges  $OT Visit 1 Visit  OT Treatments  $Self Care/Home Management  8-22 mins  Luisa DagoHilary Vernice Bowker, OT/L  OT Clinical Specialist [phone]

## 2017-12-29 NOTE — Progress Notes (Signed)
Physical Therapy Treatment Patient Details Name: Brittney HawsMary A Dhawan MRN: [id number] DOB: 02/08/1930 Today's Date: 12/29/2017    History of Present Illness Pt is an 84 y.o. female admitted [facility] SNF to [facility] on March 12, 2018 with worsening SOB and cough; initial workup concerning for RUL pneumonia and CHF exacerbation. Sufferred respiratory arrest in setting of hematemesis vs hemoptysis; ETT 7/24. PMH includes dementia, CHF, chronic respiratory failure, COPD (3L home O2), asthma.    PT Comments    Pt extubated this morning and is currently on 4 L O2 via [place]. RN requested pt to get OOB. Pt reports she is not feeling well but will attempt to stand pivot transfer to recliner. Pt requires modA for coming to EoB, once there pt with c/o of dizziness (see OT note for BP change) , able to sit EoB for 5 minutes and pt washed her face. Pt declined to transfer to chair reporting dizziness had not subsided. Pt requires max A to return to bed. Pt agrees to bed placement in chair position to improve breathing. PT continues to recommend SNF level rehab at d/c and will continue to progress mobility acutely.      Follow Up Recommendations  SNF     Equipment Recommendations  None recommended by PT    Recommendations for Other Services       Precautions / Restrictions Precautions Precautions: Fall Restrictions Weight Bearing Restrictions: No    Mobility  Bed Mobility Overal bed mobility: Needs Assistance Bed Mobility: Sit to Supine;Supine to Sit     Supine to sit: Mod assist Sit to supine: Max assist   General bed mobility comments: pt able to progress to EOB with modA LB management;pt required maxA to recline trunk and bring BLE up on bed  Transfers Overall transfer level: Needs assistance               General transfer comment: pt declined OOB mobility         Balance Overall balance assessment: Needs assistance Sitting-balance support: Single extremity supported;Feet supported Sitting  balance-Leahy Scale: Fair         Standing balance comment: pt declined OOB mobiltiy                             Cognition Arousal/Alertness: Awake/alert Behavior During Therapy: Anxious Overall Cognitive Status: No family/caregiver present to determine baseline cognitive functioning                                 General Comments: Following commands appropriately with increased time;pt appeared anxious with mobility and with discussion about use of maxisky      Exercises      General Comments General comments (skin integrity, edema, etc.): pt coughed up dark colored secretions after sitting EOB      Pertinent Vitals/Pain Pain Assessment: No/denies pain Pain Intervention(s): Monitored during session           PT Goals (current goals can now be found in the care plan section) Acute Rehab PT Goals Patient Stated Goal: to get up next session  PT Goal Formulation: With patient Time For Goal Achievement: 01/08/18 Potential to Achieve Goals: Fair Progress towards PT goals: Progressing toward goals    Frequency    Min 3X/week      PT Plan Current plan remains appropriate    Co-evaluation PT/OT/SLP Co-Evaluation/Treatment: Yes Reason for Co-Treatment: Complexity of the  patient's impairments (multi-system involvement) PT goals addressed during session: Mobility/safety with mobility OT goals addressed during session: ADL's and self-care      AM-PAC PT "6 Clicks" Daily Activity  Outcome Measure  Difficulty turning over in bed (including adjusting bedclothes, sheets and blankets)?: Unable Difficulty moving from lying on back to sitting on the side of the bed? : Unable Difficulty sitting down on and standing up from a chair with arms (e.g., wheelchair, bedside commode, etc,.)?: Unable Help needed moving to and from a bed to chair (including a wheelchair)?: Total Help needed walking in hospital room?: Total Help needed climbing 3-5 steps  with a railing? : Total 6 Click Score: 6    End of Session Equipment Utilized During Treatment: Oxygen Activity Tolerance: Patient limited by fatigue Patient left: in bed;with call bell/phone within reach;with bed alarm set Nurse Communication: Mobility status PT Visit Diagnosis: Unsteadiness on feet (R26.81);Other abnormalities of gait and mobility (R26.89);Muscle weakness (generalized) (M62.81)     Time: 2130-8657 PT Time Calculation (min) (ACUTE ONLY): 39 min  Charges:  $Therapeutic Activity: 8-22 mins                     Daxtin Leiker B. Beverely Risen PT, DPT Acute Rehabilitation  [phone] Pager [phone]     Elon Alas Fleet 12/29/2017, 6:05 PM

## 2017-12-29 NOTE — Progress Notes (Signed)
PT Cancellation Note  Patient Details Name: Brittney HawsMary A Li MRN: [id number] DOB: 02/21/1930   Cancelled Treatment:    Reason Eval/Treat Not Completed: (P) Medical issues which prohibited therapy Pt is being extubated within the hour and RN request to check back this afternoon. PT will follow back as able.   Lavonia Eager B. Beverely RisenVan Fleet PT, DPT Acute Rehabilitation  [phone] Pager [phone]  Elon Alaslizabeth B Van Fleet 12/29/2017, 12:00 PM

## 2017-12-30 LAB — GLUCOSE, CAPILLARY
GLUCOSE-CAPILLARY: 97 mg/dL (ref 70–99)
GLUCOSE-CAPILLARY: 82 mg/dL (ref 70–99)
GLUCOSE-CAPILLARY: 116 mg/dL — AB (ref 70–99)
Glucose-Capillary: 132 mg/dL — ABNORMAL HIGH (ref 70–99)
Glucose-Capillary: 85 mg/dL (ref 70–99)
Glucose-Capillary: 95 mg/dL (ref 70–99)

## 2017-12-30 LAB — CBC
HEMATOCRIT: 24.4 % — AB (ref 36.0–46.0)
HEMOGLOBIN: 7.7 g/dL — AB (ref 12.0–15.0)
MCH: 29.3 pg (ref 26.0–34.0)
MCHC: 31.6 g/dL (ref 30.0–36.0)
MCV: 92.8 fL (ref 78.0–100.0)
Platelets: 134 10*3/uL — ABNORMAL LOW (ref 150–400)
RBC: 2.63 MIL/uL — ABNORMAL LOW (ref 3.87–5.11)
RDW: 15.7 % — ABNORMAL HIGH (ref 11.5–15.5)
WBC: 10 10*3/uL (ref 4.0–10.5)

## 2017-12-30 LAB — BASIC METABOLIC PANEL
Anion gap: 6 (ref 5–15)
BUN: 54 mg/dL — ABNORMAL HIGH (ref 8–23)
CALCIUM: 8.4 mg/dL — AB (ref 8.9–10.3)
CHLORIDE: 111 mmol/L (ref 98–111)
CO2: 35 mmol/L — ABNORMAL HIGH (ref 22–32)
CREATININE: 1.06 mg/dL — AB (ref 0.44–1.00)
GFR calc Af Amer: 54 mL/min — ABNORMAL LOW (ref >60)
GFR calc non Af Amer: 46 mL/min — ABNORMAL LOW (ref >60)
GLUCOSE: 96 mg/dL (ref 70–99)
Potassium: 3.7 mmol/L (ref 3.5–5.1)
Sodium: 152 mmol/L — ABNORMAL HIGH (ref 135–145)

## 2017-12-30 LAB — POCT I-STAT 3, ART BLOOD GAS (G3+)
Acid-Base Excess: 9 mmol/L — ABNORMAL HIGH (ref 0.0–2.0)
Bicarbonate: 35 mmol/L — ABNORMAL HIGH (ref 20.0–28.0)
O2 SAT: 95 %
PCO2 ART: 60.9 mmHg — AB (ref 32.0–48.0)
PO2 ART: 78 mmHg — AB (ref 83.0–108.0)
TCO2: 37 mmol/L — AB (ref 22–32)
pH, Arterial: 7.367 (ref 7.350–7.450)

## 2017-12-30 LAB — BRAIN NATRIURETIC PEPTIDE: B NATRIURETIC PEPTIDE 5: 2351.4 pg/mL — AB (ref 0.0–100.0)

## 2017-12-30 MED ORDER — RESOURCE THICKENUP CLEAR PO POWD
ORAL | Status: DC | PRN
  Administered 2018-01-02: 10:00:00 via ORAL
  Filled 2017-12-30 (×2): qty 125

## 2017-12-30 MED ORDER — FUROSEMIDE 10 MG/ML IJ SOLN
60.0000 mg | Freq: Four times a day (QID) | INTRAMUSCULAR | Status: AC
  Administered 2017-12-30 (×3): 60 mg via INTRAVENOUS
  Filled 2017-12-30 (×3): qty 6

## 2017-12-30 MED ORDER — ADULT MULTIVITAMIN W/MINERALS CH
1.0000 | ORAL_TABLET | Freq: Every day | ORAL | Status: DC
  Administered 2017-12-30 – 2018-01-02 (×4): 1 via ORAL
  Filled 2017-12-30 (×4): qty 1

## 2017-12-30 MED ORDER — POTASSIUM CHLORIDE 10 MEQ/100ML IV SOLN: 10.0000 meq | INTRAVENOUS | Status: DC

## 2017-12-30 MED ORDER — FUROSEMIDE 10 MG/ML IJ SOLN
20.0000 mg | Freq: Once | INTRAMUSCULAR | Status: AC
  Administered 2017-12-30: 20 mg via INTRAVENOUS
  Filled 2017-12-30: qty 2

## 2017-12-30 MED ORDER — POTASSIUM CHLORIDE CRYS ER 20 MEQ PO TBCR
40.0000 meq | EXTENDED_RELEASE_TABLET | Freq: Once | ORAL | Status: AC
Start: 2017-12-30 — End: 2017-12-30
  Administered 2017-12-30: 40 meq via ORAL
  Filled 2017-12-30: qty 2

## 2017-12-30 NOTE — Progress Notes (Signed)
Nutrition Follow-up  DOCUMENTATION CODES:   Non-severe (moderate) malnutrition in context of chronic illness, Obesity unspecified  INTERVENTION:    Diet advancement per SLP as able.  Will likely need PO supplements to help meet nutrition needs once diet is advanced, recommend Ensure Enlive BID.  NUTRITION DIAGNOSIS:   Moderate Malnutrition related to chronic illness(CHF, COPD) as evidenced by mild fat depletion, mild muscle depletion.  Ongoing  GOAL:   Patient will meet greater than or equal to 90% of their needs  Unmet  MONITOR:   Diet advancement, PO intake, Labs, Weight trends, I & O's  ASSESSMENT:   84 yo female with PMH of asthma, bronchitis, CHF, COPD, HTN, DM, renal disorder, and dementia who was admitted on 7/23 with CHF exacerbation vs PNA.   Patient was extubated yesterday. Volume overloaded today.  I/O +6.5 L since admission Required BiPAP for a short time last night. Currently on non-re-breather mask.  Remains NPO. Swallow evaluation with SLP has been ordered. Labs reviewed. Sodium 152 (H) CBG's: 85-95 Medications reviewed and include Lasix, Novolog, Remeron, MVI, neosynephrine.   Diet Order:   Diet Order    None      EDUCATION NEEDS:   No education needs have been identified at this time  Skin:  Skin Assessment: Reviewed RN Assessment  Last BM:  8/1  Height:   Ht Readings from Last 1 Encounters:  12/22/17 5\' 4"  (1.626 m)    Weight:   Wt Readings from Last 1 Encounters:  12/30/17 197 lb 1.5 oz (89.4 kg)    Ideal Body Weight:  54.5 kg  BMI:  Body mass index is 33.83 kg/m.  Estimated Nutritional Needs:   Kcal:  1600-1800  Protein:  95-110 gm  Fluid:  1.6-1.8 L    Joaquin CourtsKimberly Harris, RD, LDN, CNSC Pager [phone] After Hours Pager [phone]

## 2017-12-30 NOTE — Progress Notes (Signed)
eLink Physician-Brief Progress Note Patient Name: Charlton HawsMary A Terrien DOB: 11/03/1929 MRN: [id number]   Date of Service  12/30/2017  HPI/Events of Note  Desaturation after morning bath. Last CXR suggestive of hypervolemia.  eICU Interventions  BNP ordered, Lasix 20 mg iv x 1, CPAP of 8 cm H20 as needed to stabilize O2 saturation.        Thomasene Lotkoronkwo U Ogan 12/30/2017, 6:12 AM

## 2017-12-30 NOTE — Progress Notes (Signed)
PULMONARY / CRITICAL CARE MEDICINE   Name: Brittney Li MRN: [id number] DOB: [birth year]    ADMISSION DATE:  12/26/2017 CONSULTATION DATE:  12/22/2017  REFERRING MD:  Dr. Dyann Kief [facility])  CHIEF COMPLAINT:  Dyspnea  HISTORY OF PRESENT ILLNESS:   84 y/o female with COPD, CHF on 3 L O2 at home presented with pneumonia, respiratory failure.  Was extubated on 7/31.  Noted to have some hematemesis/hemoptysis, source uncertain.   SUBJECTIVE:  Overnight had desaturation with bath, brief BIPAP  VITAL SIGNS: BP 119/67   Pulse 70   Temp 99 F (37.2 C)   Resp (!) 23   Ht _0  (1.626 m)   Wt 89.4 kg (197 lb 1.5 oz)   SpO2 94%   BMI 33.83 kg/m   HEMODYNAMICS: CVP:  [7 mmHg-26 mmHg] 12 mmHg  VENTILATOR SETTINGS: FiO2 (%):  [70 %] 70 %  INTAKE / OUTPUT: I/O last 3 completed shifts: In: 2465.2 [P.O.:60; I.V.:1005.2; NG/GT:1400] Out: 2105 [Urine:2105]  PHYSICAL EXAMINATION:  General:  Resting comfortably in bed HENT: NCAT OP clear PULM: Crackles bases B, normal effort CV: RRR, no mgr, JVD elevated GI: BS+, soft, nontender MSK: normal bulk and tone Neuro: awake, alert, no distress, MAEW   LABS:  BMET Recent Labs  Lab 12/28/17 0500 12/29/17 0403 12/30/17 0430  NA 152* 150* 152*  K 4.0 3.4* 3.7  CL 111 108 111  CO2 35* 36* 35*  BUN 58* 56* 54*  CREATININE 1.14* 1.02* 1.06*  GLUCOSE 115* 107* 96    Electrolytes Recent Labs  Lab 12/24/17 1653 12/25/17 0245 12/26/17 0400  12/27/17 2223 12/28/17 0500 12/29/17 0403 12/30/17 0430  CALCIUM  --  8.5* 8.3*   < > 8.2* 8.3* 8.1* 8.4*  MG 2.3 2.3 2.4  --  2.4  --   --   --   PHOS 3.1 3.1 2.7  --   --   --   --   --    < > = values in this interval not displayed.    CBC Recent Labs  Lab 12/28/17 0500 12/29/17 0403 12/30/17 0539  WBC 10.4 10.6* 10.0  HGB 7.7* 7.4* 7.7*  HCT 24.0* 22.5* 24.4*  PLT 121* 131* 134*    Coag's Recent Labs  Lab 12/29/17 0755  INR 1.20    Sepsis Markers Recent Labs   Lab 12/24/17 0320  PROCALCITON 0.21    ABG Recent Labs  Lab 12/26/17 0435 12/26/17 1415 12/30/17 0745  PHART 7.482* 7.466* 7.367  PCO2ART 52.0* 51.1* 60.9*  PO2ART 78.2* 72.6* 78.0*    Liver Enzymes No results for input(s): AST, ALT, ALKPHOS, BILITOT, ALBUMIN in the last 168 hours.  Cardiac Enzymes No results for input(s): TROPONINI, PROBNP in the last 168 hours.  Glucose Recent Labs  Lab 12/29/17 1135 12/29/17 1512 12/29/17 1929 12/29/17 2305 12/30/17 0332 12/30/17 0715  GLUCAP 127* 119* 103* 91 85 95    Imaging Dg Chest Portable 1 View  Result Date: 12/29/2017 CLINICAL DATA:  Bloody substance draining from endotracheal tube. EXAM: PORTABLE CHEST 1 VIEW COMPARISON:  12/28/2017 FINDINGS: Endotracheal tube terminates 7.3 cm above the carina. Advancement with 3-4 cm may be considered. Enteric catheter terminates in the expected location of gastric cardia. Cardiomediastinal silhouette is normal. Mediastinal contours appear intact. Persistent patchy airspace consolidation in the right lung, and lower lobe of the left lung. Bilateral pleural effusions. Osseous structures are without acute abnormality. Soft tissues are grossly normal. IMPRESSION: Endotracheal tube terminates 7.3 cm above the carina.  Advancement with 3-4 cm may be considered. Persistent patchy airspace consolidation throughout the right lung, and lower lobe of the left lung. Bilateral pleural effusions. Overall, slight worsening of the aeration of the lungs. Electronically Signed   By: Fidela Salisbury M.D.   On: 12/29/2017 08:53     STUDIES:  CT Chest w/ Contrast 7/24>> Bronchoscopy 7/24>> C diff 7/28>> negative  CULTURES: Blood x2 7/23>> NGTD MRSA PCR 7/23>> Negative Strep Pneumo urinary antigen 7/24>> negative Legionella urinary antigen 7/24>> BAL 7/24>> normal respiratory flora Urine culture 7/24>>NG Pleural fluid culture 7/26 >> NGTD  ANTIBIOTICS: Aztreonam 7/23>> 7/26 Vancomycin 7/23>>  7/26 Ceftaz 7/26 >> 7/30  SIGNIFICANT EVENTS: 12/12/2017>> Presented to [facility] 12/22/17>> Intubated, hemoptysis, transfer to [facility] 7/25 >> GI consult, no EGD needed 12/24/17>> Thoracentesis showing transudative fluid 12/26/17>> weaned further on vent 12/27/17>> Required addition of neosynephrine for MAP<60 unresponsive to fluid bolus 12/28/17>> Bloody secretions from ETT and OGT  LINES/TUBES: ETT 7/24>> 7/31 Rt radial A line 7/24 >  R PICC 7/27>    DISCUSSION: 84 y/o female with CAP causing respiratory failure, has baseline COPD, CHF.  Is volume overloaded this morning.   ASSESSMENT / PLAN:  PULMONARY A: COPD Acute respiratory failure with hypoxemia Acute pulmonary edema P:   Diurese today Use supplemental O2 to maintain O2 saturation > 90% Continue pulmicort and scheduled duoneb  CARDIOVASCULAR A:  Afib Acute diastolic heart failure P:  Tele Monitor hemodynamics Continue diuresis today Hold anticoagulation with bleeding (from throat?)  RENAL A:   Hypernatremia Volume overload P:   Monitor BMET and UOP Replace electrolytes as needed Diurese today Will likely need free water by 8/2  GASTROINTESTINAL A:   Hematemesis? Hemoglobin stable If there is a GI bleed it is slow GERD P:   Advance diet Maintain PPI oral for now  HEMATOLOGIC A:   Hemorrhagic anemia, stable P:  Monitor for bleeding Transfuse PRBC for Hgb < 7 gm/dL   INFECTIOUS A:   Severe CAP P:   Completed antibiotics Monitor for fever  ENDOCRINE A:   No acute issues   P:   Monitor glucose  NEUROLOGIC A:   No acute issues P:   Minimize sedating medications   FAMILY  - Updates: none bedside  - Inter-disciplinary family meet or Palliative Care meeting due by:  Day 7   My cc time 33 minutes  Roselie Awkward, MD [facility] [facility] Pager: [phone] Cell: [phone] After 3pm or if no response, call [phone]   12/30/2017, 8:16 AM ,

## 2017-12-30 NOTE — Evaluation (Signed)
Clinical/Bedside Swallow Evaluation Patient Details  Name: Brittney Li MRN: [id number] Date of Birth: 02/22/1930  Today's Date: 12/30/2017 Time: SLP Start Time (ACUTE ONLY): 1320 SLP Stop Time (ACUTE ONLY): 1332 SLP Time Calculation (min) (ACUTE ONLY): 12 min  Past Medical History:  Past Medical History:  Diagnosis Date  . Arthritis   . Asthma   . Bronchitis   . CHF (congestive heart failure) (HCC)    a. ECHO (01/2013) EF 60-65%, grade II diastolic dysfx, LA mildly dilated  . COPD (chronic obstructive pulmonary disease) (HCC)   . Dementia   . Diabetes mellitus without complication (HCC)   . Hyperkalemia   . Hypertension   . Renal disorder   . Respiratory failure [employer](HCC)    Past Surgical History:  Past Surgical History:  Procedure Laterality Date  . ABDOMINAL HYSTERECTOMY  1975  . hands     HPI:  84 y/o female with COPD, CHF on 3 L O2 at home presented with pneumonia, respiratory failure.  Ar[facility] 7[facility], intubated 7/24 due to respiratory arrest resulting from  hematemesis/hemoptysis, source uncertain.  GI cleared pt. Pt extubated on 7/31 has been tolerating clears. Pt reports consuming pureed foods and nectar thick liquids PTA or possibly during a stay at [facility]. She also confirms this is her diet preference.    Assessment / Plan / Recommendation Clinical Impression  Pt demonstrates no overt signs of aspiration with thin liquids. However, she is dysphonic with the potential for decreased sensation of aspiration following 7 days intubated. She reports liking "thick water" and says she was given this at [facility]. She also reports her top denture does not fit and she prefers pureed foods at baseline. For now, will initiate a conservative, modified diet, both to meet pts preference, but also to reduce the chance of silent aspiration events. Will consider objective testing depending on progress. Pt in agreement with plan.  SLP Visit Diagnosis: Dysphagia, oropharyngeal  phase (R13.12)    Aspiration Risk  Mild aspiration risk    Diet Recommendation Dysphagia 1 (Puree);Nectar-thick liquid   Liquid Administration via: Cup;Straw Medication Administration: Crushed with puree Supervision: Patient able to self feed Compensations: Slow rate;Small sips/bites Postural Changes: Seated upright at 90 degrees    Other  Recommendations Other Recommendations: Order thickener from pharmacy   Follow up Recommendations 24 hour supervision/assistance      Frequency and Duration min 2x/week  2 weeks       Prognosis        Swallow Study   General HPI: 84 y/o female with COPD, CHF on 3 L O2 at home presented with pneumonia, respiratory failure.  [facility] 7[facility], intubated 7/24 due to respiratory arrest resulting from  hematemesis/hemoptysis, source uncertain.  GI cleared pt. Pt extubated on 7/31 has been tolerating clears. Pt reports consuming pureed foods and nectar thick liquids PTA or possibly [facility] a [facility]. She also confirms this is her diet preference.  Diet Prior to this Study: NPO Temperature Spikes Noted: No Respiratory Status: Nasal cannula History of Recent Intubation: Yes Length of Intubations (days): 7 days Date extubated: 12/29/17 Behavior/Cognition: Alert;Cooperative Oral Cavity Assessment: Within Functional Limits Oral Care Completed by SLP: No Oral Cavity - Dentition: Poor condition;Missing dentition Vision: Functional for self-feeding Self-Feeding Abilities: Able to feed self Patient Positioning: Upright in bed Baseline Vocal Quality: Breathy;Hoarse Volitional Cough: Strong Volitional Swallow: Able to elicit    Oral/Motor/Sensory Function Overall Oral Motor/Sensory Function: Within functional limits  Ice Chips     Thin Liquid Thin Liquid: Within functional limits Presentation: Cup;Straw;Self Fed    Nectar Thick Nectar Thick Liquid: Not tested   Honey Thick Honey Thick Liquid: Not tested   Puree Puree: Within  functional limits Presentation: Spoon   Solid     Solid: Not tested     Harlon Ditty, MA CCC-SLP [phone]  Claudine Mouton 12/30/2017,2:46 PM

## 2017-12-30 DEATH — deceased

## 2017-12-31 ENCOUNTER — Inpatient Hospital Stay (HOSPITAL_COMMUNITY): Payer: Medicare Other

## 2017-12-31 LAB — BASIC METABOLIC PANEL
Anion gap: 4 — ABNORMAL LOW (ref 5–15)
BUN: 56 mg/dL — AB (ref 8–23)
CALCIUM: 8.2 mg/dL — AB (ref 8.9–10.3)
CO2: 36 mmol/L — AB (ref 22–32)
CREATININE: 1.07 mg/dL — AB (ref 0.44–1.00)
Chloride: 114 mmol/L — ABNORMAL HIGH (ref 98–111)
GFR calc Af Amer: 53 mL/min — ABNORMAL LOW (ref >60)
GFR calc non Af Amer: 46 mL/min — ABNORMAL LOW (ref >60)
Glucose, Bld: 119 mg/dL — ABNORMAL HIGH (ref 70–99)
Potassium: 3.8 mmol/L (ref 3.5–5.1)
SODIUM: 154 mmol/L — AB (ref 135–145)

## 2017-12-31 LAB — GLUCOSE, CAPILLARY
GLUCOSE-CAPILLARY: 111 mg/dL — AB (ref 70–99)
GLUCOSE-CAPILLARY: 110 mg/dL — AB (ref 70–99)
Glucose-Capillary: 140 mg/dL — ABNORMAL HIGH (ref 70–99)
Glucose-Capillary: 144 mg/dL — ABNORMAL HIGH (ref 70–99)
Glucose-Capillary: 134 mg/dL — ABNORMAL HIGH (ref 70–99)
Glucose-Capillary: 154 mg/dL — ABNORMAL HIGH (ref 70–99)

## 2017-12-31 LAB — CBC
HCT: 22.6 % — ABNORMAL LOW (ref 36.0–46.0)
Hemoglobin: 7 g/dL — ABNORMAL LOW (ref 12.0–15.0)
MCH: 28.9 pg (ref 26.0–34.0)
MCHC: 31 g/dL (ref 30.0–36.0)
MCV: 93.4 fL (ref 78.0–100.0)
Platelets: 121 10*3/uL — ABNORMAL LOW (ref 150–400)
RBC: 2.42 MIL/uL — ABNORMAL LOW (ref 3.87–5.11)
RDW: 16 % — AB (ref 11.5–15.5)
WBC: 7.9 10*3/uL (ref 4.0–10.5)

## 2017-12-31 LAB — HEMOGLOBIN AND HEMATOCRIT, BLOOD
HCT: 23.9 % — ABNORMAL LOW (ref 36.0–46.0)
Hemoglobin: 7.6 g/dL — ABNORMAL LOW (ref 12.0–15.0)

## 2017-12-31 MED ORDER — DEXTROSE 5 % IV SOLN
INTRAVENOUS | Status: AC
  Administered 2017-12-31 (×2): via INTRAVENOUS

## 2017-12-31 MED ORDER — ATORVASTATIN CALCIUM 20 MG PO TABS
20.0000 mg | ORAL_TABLET | Freq: Every day | ORAL | Status: DC
  Administered 2017-12-31 – 2018-01-01 (×2): 20 mg via ORAL
  Filled 2017-12-31 (×3): qty 1

## 2017-12-31 NOTE — Progress Notes (Signed)
CSW continues to follow for further discharge needs once medically stable.   Claude MangesKierra S. Tameshia Bonneville, MSW, LCSW-A Emergency Department Clinical Social Worker [phone]

## 2017-12-31 NOTE — Progress Notes (Signed)
Physical Therapy Treatment Patient Details Name: Brittney Li MRN: [id number] DOB: March 25, 1930 Today's Date: 12/31/2017    History of Present Illness Pt is an 84 y.o. female [facility] from [facility] to [facility] on 2018-01-12 with worsening SOB and cough; initial workup concerning for RUL pneumonia and CHF exacerbation. Sufferred respiratory arrest in setting of hematemesis vs hemoptysis; ETT 7/24. PMH includes dementia, CHF, chronic respiratory failure, COPD (3L home O2), asthma.    PT Comments    Pt is making slow progress towards her goals today but is limited in progressing mobility by oxygen desaturation (see General Comments). Pt requires modAx2 for getting to EoB, where she sat for 3 minutes without assist before her O2 levels dropped and she became dizzy. Pt then required maxAx2 to return to supine where her O2 levels rebounded. RN notified and requested staff work on building tolerance to upright position between therapy sessions. PT will continue to follow acutely to progress mobility.   Follow Up Recommendations  SNF     Equipment Recommendations  None recommended by PT    Recommendations for Other Services       Precautions / Restrictions Precautions Precautions: Fall Restrictions Weight Bearing Restrictions: No    Mobility  Bed Mobility Overal bed mobility: Needs Assistance Bed Mobility: Sit to Supine;Supine to Sit     Supine to sit: Mod assist;+2 for physical assistance Sit to supine: Max assist;+2 for physical assistance   General bed mobility comments: pt able to progress to EOB with modA LB management;pt required maxA to recline trunk and bring BLE up on bed  Transfers Overall transfer level: Needs assistance               General transfer comment: pt declined OOB mobility         Balance Overall balance assessment: Needs assistance Sitting-balance support: Single extremity supported;Feet supported Sitting balance-Leahy Scale: Fair                                       Cognition Arousal/Alertness: Awake/alert Behavior During Therapy: WFL for tasks assessed/performed Overall Cognitive Status: No family/caregiver present to determine baseline cognitive functioning                                 General Comments: pt anxious about coming to sit EoB because she states she is so tired      Exercises      General Comments General comments (skin integrity, edema, etc.): Pt on 10 L O2 via HFNC, in supine SaO2 98%O2, with sitting EOB intitially pt able to maintain SaO2 >90%O2 however after approximately 3 minutes pt SaO2 dropped to 86%O2 despite cuing for pursed lipped breathing SaO2 continued to drop to 83%O2 and pt with c/o of dizziness, pt returned to supine with HoB elevated and SaO2 rebounded to 98%O2 within 5 minutes       Pertinent Vitals/Pain Pain Assessment: Faces Faces Pain Scale: Hurts little more Pain Location: generalized with movement Pain Descriptors / Indicators: Grimacing Pain Intervention(s): Limited activity within patient's tolerance;Monitored during session;Repositioned           PT Goals (current goals can now be found in the care plan section) Acute Rehab PT Goals Patient Stated Goal: to get up next session  PT Goal Formulation: With patient Time For Goal Achievement: 01/08/18 Potential to Achieve Goals: Fair  Progress towards PT goals: Progressing toward goals    Frequency    Min 3X/week      PT Plan Current plan remains appropriate    Co-evaluation PT/OT/SLP Co-Evaluation/Treatment: Yes            AM-PAC PT "6 Clicks" Daily Activity  Outcome Measure  Difficulty turning over in bed (including adjusting bedclothes, sheets and blankets)?: Unable Difficulty moving from lying on back to sitting on the side of the bed? : Unable Difficulty sitting down on and standing up from a chair with arms (e.g., wheelchair, bedside commode, etc,.)?: Unable Help needed moving to and from  a bed to chair (including a wheelchair)?: Total Help needed walking in hospital room?: Total Help needed climbing 3-5 steps with a railing? : Total 6 Click Score: 6    End of Session Equipment Utilized During Treatment: Oxygen Activity Tolerance: Patient limited by fatigue Patient left: in bed;with call bell/phone within reach;with bed alarm set Nurse Communication: Mobility status PT Visit Diagnosis: Unsteadiness on feet (R26.81);Other abnormalities of gait and mobility (R26.89);Muscle weakness (generalized) (M62.81);Dizziness and giddiness (R42)     Time: [id number] PT Time Calculation (min) (ACUTE ONLY): 22 min  Charges:  $Therapeutic Activity: 8-22 mins                     Crystalina Stodghill B. Beverely RisenVan Fleet PT, DPT Acute Rehabilitation  [phone] Pager [phone]     Elon Alaslizabeth B Van Fleet 12/31/2017, 4:18 PM

## 2017-12-31 NOTE — Progress Notes (Signed)
eLink Physician-Brief Progress Note Patient Name: Charlton HawsMary A Joaquin DOB: 06/27/1929 MRN: [id number]   Date of Service  12/31/2017  HPI/Events of Note  Hypernatremia  eICU Interventions  D % W infusion x 1 liter then d/c.        Thomasene Lotkoronkwo U Ogan 12/31/2017, 6:20 AM

## 2017-12-31 NOTE — Progress Notes (Signed)
LB PCCM  S: still coughing up some blood, remains extubated; given D5 overnight, eating OK  O:  Vitals:   12/31/17 0700 12/31/17 0721 12/31/17 0800 12/31/17 0900  BP: (!) 107/56 100/66 (!) 87/60 (!) 102/57  Pulse: 64 67 65 66  Resp: 20 (!) 21 20 (!) 23  Temp: 98.8 F (37.1 C)  98.8 F (37.1 C) 98.6 F (37 C)  TempSrc:      SpO2: 98% 95% 96% 97%  Weight:      Height:       High flow O2 10L   General:  Resting comfortably in bed HENT: NCAT OP clear PULM: Diminished air flow, no crackles, normal effort CV: Irreg irreg, no mgr GI: BS+, soft, nontender MSK: normal bulk and tone Neuro: awake, alert, no distress, MAEW   CXR 7/31 > RUL infiltrate, emphysema  BMET    Component Value Date/Time   NA 154 (H) 12/31/2017 0405   K 3.8 12/31/2017 0405   CL 114 (H) 12/31/2017 0405   CO2 36 (H) 12/31/2017 0405   GLUCOSE 119 (H) 12/31/2017 0405   BUN 56 (H) 12/31/2017 0405   CREATININE 1.07 (H) 12/31/2017 0405   CREATININE 1.30 (H) 09/23/2015 1223   CALCIUM 8.2 (L) 12/31/2017 0405   GFRNONAA 46 (L) 12/31/2017 0405   GFRAA 53 (L) 12/31/2017 0405   CBC    Component Value Date/Time   WBC 7.9 12/31/2017 0405   RBC 2.42 (L) 12/31/2017 0405   HGB 7.6 (L) 12/31/2017 0734   HCT 23.9 (L) 12/31/2017 0734   PLT 121 (L) 12/31/2017 0405   MCV 93.4 12/31/2017 0405   MCH 28.9 12/31/2017 0405   MCHC 31.0 12/31/2017 0405   RDW 16.0 (H) 12/31/2017 0405   LYMPHSABS 0.7 12/09/2017 1543   MONOABS 0.5 12/22/2017 1543   EOSABS 0.0 12/28/2017 1543   BASOSABS 0.0 12/14/2017 1543   Impression/Plan: HCAP: completed ceftazidime, monitor for fever Acute respiratory failure with hypoxemia: ongoing hypoxemia due to pneumonia and emphysema, suspect this will take quite some time to recover Acute pulmonary edema: I think this has resolved, will repeat a CXR today, hold lasix with hypernatremia Hypernatremia: continue D5W infusion this morning Hemoptysis: continues, Hgb is stable, hold  anticoagulants, if continues beyond next 3-4 days consider ENT eval, suspect posterior pharyngeal injury or proximal tracheal injury given negative bronch and no clear evidence of a GI bleed Hyperlipidemia: restart lipitor Don't see a history of stroke or MI so won't restart ASA  Will talk to her family today  [facility]aint[facility]  Heber CarolinaBrent Demetrias Goodbar, MD [place] [facility] Pager: [phone] Cell: [phone] After 3pm or if no response, call [phone]

## 2017-12-31 NOTE — Progress Notes (Signed)
  Speech Language Pathology Treatment: Dysphagia  Patient Details Name: Charlton HawsMary A Escorcia MRN: [id number] DOB: 12/30/1929 Today's Date: 12/31/2017 Time: [id number] SLP Time Calculation (min) (ACUTE ONLY): 8 min  Assessment / Plan / Recommendation Clinical Impression  Pts vocal quality much improved, enjoying drinks. Still reports preference for nectar thickened liquids. Trialed thin without signs of aspiration, but pt reported she didn't like it. She mostly only wants to eat ice. Pt to continue current diet given her preferences, but could also be advanced to thin liquids if she would like to.   HPI HPI: 84 y/o female with COPD, CHF on 3 L O2 at home presented with pneumonia, respiratory failure.  [facility] 7/[facility], intubated 7/24 due to respiratory arrest resulting from  hematemesis/hemoptysis, source uncertain.  GI cleared pt. Pt extubated on 7/31 has been tolerating clears. Pt reports consuming pureed foods and nectar thick liquids PTA or possibly [facility]. She also confirms this is her diet preference.       SLP Plan  Continue with current plan of care       Recommendations  Diet recommendations: Dysphagia 1 (puree);Nectar-thick liquid Liquids provided via: Cup;Straw Medication Administration: Crushed with puree Compensations: Slow rate;Small sips/bites                Follow up Recommendations: 24 hour supervision/assistance SLP Visit Diagnosis: Dysphagia, oropharyngeal phase (R13.12) Plan: Continue with current plan of care       GO               [facility] Hospital Of MarylandBonnie Glenetta Kiger, MA CCC-SLP [phone]  Claudine MoutonDeBlois, Maral Lampe Caroline 12/31/2017, 2:01 PM

## 2018-01-01 LAB — CBC
HCT: 25.9 % — ABNORMAL LOW (ref 36.0–46.0)
Hemoglobin: 8.1 g/dL — ABNORMAL LOW (ref 12.0–15.0)
MCH: 29.2 pg (ref 26.0–34.0)
MCHC: 31.3 g/dL (ref 30.0–36.0)
MCV: 93.5 fL (ref 78.0–100.0)
PLATELETS: 133 10*3/uL — AB (ref 150–400)
RBC: 2.77 MIL/uL — AB (ref 3.87–5.11)
RDW: 15.9 % — ABNORMAL HIGH (ref 11.5–15.5)
WBC: 8.8 10*3/uL (ref 4.0–10.5)

## 2018-01-01 LAB — BASIC METABOLIC PANEL
Anion gap: 7 (ref 5–15)
BUN: 57 mg/dL — AB (ref 8–23)
CO2: 35 mmol/L — ABNORMAL HIGH (ref 22–32)
Calcium: 8.7 mg/dL — ABNORMAL LOW (ref 8.9–10.3)
Chloride: 108 mmol/L (ref 98–111)
Creatinine, Ser: 1.28 mg/dL — ABNORMAL HIGH (ref 0.44–1.00)
GFR calc Af Amer: 43 mL/min — ABNORMAL LOW (ref >60)
GFR, EST NON AFRICAN AMERICAN: 37 mL/min — AB (ref >60)
GLUCOSE: 120 mg/dL — AB (ref 70–99)
Potassium: 4.1 mmol/L (ref 3.5–5.1)
Sodium: 150 mmol/L — ABNORMAL HIGH (ref 135–145)

## 2018-01-01 LAB — GLUCOSE, CAPILLARY
Glucose-Capillary: 103 mg/dL — ABNORMAL HIGH (ref 70–99)
Glucose-Capillary: 116 mg/dL — ABNORMAL HIGH (ref 70–99)
Glucose-Capillary: 118 mg/dL — ABNORMAL HIGH (ref 70–99)
Glucose-Capillary: 121 mg/dL — ABNORMAL HIGH (ref 70–99)
Glucose-Capillary: 123 mg/dL — ABNORMAL HIGH (ref 70–99)

## 2018-01-01 LAB — BLOOD GAS, ARTERIAL
Acid-Base Excess: 9.8 mmol/L — ABNORMAL HIGH (ref 0.0–2.0)
Bicarbonate: 35.5 mmol/L — ABNORMAL HIGH (ref 20.0–28.0)
Drawn by: 502221
FIO2: 100
O2 CONTENT: 25 L/min
O2 SAT: 89 %
PCO2 ART: 66.5 mmHg — AB (ref 32.0–48.0)
Patient temperature: 98.6
pH, Arterial: 7.348 — ABNORMAL LOW (ref 7.350–7.450)
pO2, Arterial: 57.7 mmHg — ABNORMAL LOW (ref 83.0–108.0)

## 2018-01-01 MED ORDER — PANTOPRAZOLE SODIUM 40 MG PO TBEC
40.0000 mg | DELAYED_RELEASE_TABLET | Freq: Every day | ORAL | Status: DC
  Administered 2018-01-01 – 2018-01-02 (×2): 40 mg via ORAL
  Filled 2018-01-01 (×2): qty 1

## 2018-01-01 MED ORDER — SALINE SPRAY 0.65 % NA SOLN
2.0000 | Freq: Three times a day (TID) | NASAL | Status: DC
  Administered 2018-01-01 – 2018-01-02 (×4): 2 via NASAL
  Filled 2018-01-01: qty 44

## 2018-01-01 MED ORDER — FUROSEMIDE 10 MG/ML IJ SOLN
60.0000 mg | Freq: Once | INTRAMUSCULAR | Status: AC
  Administered 2018-01-01: 60 mg via INTRAVENOUS
  Filled 2018-01-01: qty 6

## 2018-01-01 MED ORDER — SODIUM CHLORIDE 3 % IN NEBU
4.0000 mL | INHALATION_SOLUTION | Freq: Two times a day (BID) | RESPIRATORY_TRACT | Status: DC
  Filled 2018-01-01: qty 4

## 2018-01-01 MED ORDER — ACETAMINOPHEN 325 MG PO TABS: 650.0000 mg | ORAL_TABLET | ORAL | Status: DC | PRN

## 2018-01-01 MED ORDER — ALBUTEROL SULFATE (2.5 MG/3ML) 0.083% IN NEBU: 2.5000 mg | INHALATION_SOLUTION | RESPIRATORY_TRACT | Status: DC | PRN

## 2018-01-01 MED ORDER — SODIUM CHLORIDE 3 % IN NEBU
4.0000 mL | INHALATION_SOLUTION | Freq: Two times a day (BID) | RESPIRATORY_TRACT | Status: DC
  Administered 2018-01-01 – 2018-01-02 (×3): 4 mL via RESPIRATORY_TRACT
  Filled 2018-01-01 (×5): qty 4

## 2018-01-01 NOTE — Progress Notes (Addendum)
Called by RN that patient is desaturating to high 70's- low 80's. Patient was increased to 60L and 100%. Patient is starting to complain about the HFNC being uncomfortable and asking how long she has to wear it. Patient SPO2 has slowly increased from 77 to 82% at this time. RT has instructed patient to breath in through nose and out through her mouth. BBS are clear/diminshed. No Rhonchi or wheezing noted. Patient does have slight increased WOB and stating that she is tired. Patient has stated she does not want to go on BIPAP and does not wish to be intubated. RT made RN aware of changes and patients status. RN stated she would call MD. RT will continue to monitor.

## 2018-01-01 NOTE — Progress Notes (Signed)
Called to assess patient for hypoxemia.  Admitted with dense PNA and associated pleural effusion. Required intubation, treated with ceftazidime.  Developed hemoptysis.  On HFNC.  Some concern for possible posterior pharyngeal injury or proximal tracheal injury given negative bronchoscopy and no evidence of GIB.     Exam: General: pleasant elderly female in NAD, lying in bed.  States she has not slept.  HEENT: MM pink/moist, no JVD, HFNC in place Neuro: Awake, alert, oriented, MAE/generalized weakness  CV: s1s2 rrr, no m/r/g PULM: even/non-labored, lungs bilaterally clear anterior, diminished right base ZO:XWRUGI:soft, non-tender, bsx4 active  Extremities: warm/dry, generalized 2+ pitting edema  Skin: no rashes or lesions  Plan: Needs pulmonary hygiene to help with secretion clearance CXR images reviewed from 8/2, PNA is better but not cleared Nasal saline to moisten mucosa  Add IPPB Chest percussion  NTS PRN  Follow CXR  Aspiration precautions Continue duoneb  May need to consider thoracentesis on right if not improving effusion to r/o parapneumonic process Monitor on SDU  Patient is undecided about mechanical ventilation.  States she needs to think about it. She has a son who lives in [place] who would make decisions for her if necessary.   Brittney BrimBrandi Aranda Bihm, NP-C [facility] Pgr: [phone] or if no answer [phone] 01/01/2018, 4:54 AM

## 2018-01-01 NOTE — Progress Notes (Signed)
eLink Physician-Brief Progress Note Patient Name: Charlton HawsMary A Gignac DOB: 06/05/1929 MRN: [id number]   Date of Service  01/01/2018  HPI/Events of Note  Increasing oxygen requirement, drowsy on high flow oxygen  eICU Interventions  ABG        Okoron[place]wo U Ogan 01/01/2018, 2:44 AM

## 2018-01-01 NOTE — Progress Notes (Signed)
Pt O2 saturation between 86-90% with a gradual increase in O2 needs from 10 to 15L HFNC. RT on the floor to place pt on the CPAP. On call MD Opyd notified. Will continue to monitor patient closely.

## 2018-01-01 NOTE — Progress Notes (Addendum)
[place] TEAM 1 - Stepdown/ICU TEAM  FERNE ELLINGWOOD  TKZ:[id number] DOB: 1930-05-30 DOA: 11/30/2017 PCP: Rosita Fire, MD    Brief Narrative:  84 yr old female with a hx of diastolic CHF, dementia, and COPD on 3 L home O2 who [facility] with a pneumonia and acute respiratory failure. She was intubated and has been extubated since 12/29/17.   Significant Events: 7/23 Presented to [facility] 7/24 resp arrest - hemoptysis, intubated, transfer to [facility] 7/25 GI consult, no EGD needed 7/25 TTE - EF 55-60% - grade 2 DD 7/26 Thoracentesis showing transudative fluid 7/29 addition of neosynephrine for MAP<60 unresponsive to fluid bolus 7/30 Bloody secretions from ETT and OGT 7/31 extubated   Subjective: The patient has supper with significant increased work of breathing with desaturations overnight.  PC CM was reconsult them and has prescribed an aggressive chest PT regimen.  The patient is resting comfortably in bed on high flow nasal cannula at the time of my visit.  She is alert oriented and quite pleasant.  She is able to tell me she does not wish to go back on the ventilator nor does she wish to be put through CPR.  She does wish to continue aggressive medical therapy but if she begins to decline precipitously she does not wish for these aggressive efforts to be made to prolong her life.  We discussed DO NOT RESUSCITATE status and she agrees this is what she would like.  Assessment & Plan:  HCAP Has completed a course of broad empiric antibiotic therapy  Acute hypoxic respiratory failure ongoing hypoxemia due to pneumonia and emphysema, suspect this will take quite some time to recover - aggressive pulmonary toilet as per PC CM - continue high flow nasal cannula oxygen as needed  Acute pulmonary edema - diastolic CHF exacerbation It appears the patient has been diuresed as much as she can be - continue to hold diuretic for now  Hypernatremia Related to diuresis as well as poor  intake - improving - continue free water via IV for now  Hemoptysis Hgb is stable - holding anticoagulants - if continues beyond 4 days consider ENT eval - suspect posterior pharyngeal injury or proximal tracheal injury given negative bronch and no clear evidence of a GIB  Acute kidney injury Holding diuretic - follow renal fxn  Recent Labs  Lab 12/28/17 0500 12/29/17 0403 12/30/17 0430 12/31/17 0405 01/01/18 0450  CREATININE 1.14* 1.02* 1.06* 1.07* 1.28*    Hyperlipidemia cont lipitor  DVT prophylaxis: SCDs Code Status: DNR - NO CODE Family Communication: no family present at time of exam  Disposition Plan: SDU  Consultants:  PCCM GI  Antimicrobials:  None presently    Objective: Blood pressure 114/62, pulse 69, temperature 97.6 F (36.4 C), resp. rate (!) 24, height 5' (1.524 m), weight 95.4 kg (210 lb 5.1 oz), SpO2 91 %.  Intake/Output Summary (Last 24 hours) at 01/01/2018 0917 Last data filed at 01/01/2018 0600 Gross per 24 hour  Intake 1327.88 ml  Output 950 ml  Net 377.88 ml   Filed Weights   12/31/17 0500 12/31/17 1452 01/01/18 0500  Weight: 89.8 kg (197 lb 15.6 oz) 94.9 kg (209 lb 3.5 oz) 95.4 kg (210 lb 5.1 oz)    Examination: General: Modest respiratory distress but comfortable on high flow nasal cannula Lungs: Poor air movement bilateral bases right greater than left with no active wheezing Cardiovascular: Regular rate and rhythm without murmur gallop or rub normal S1 and S2 Abdomen:  Nontender, nondistended, soft, bowel sounds positive, no rebound, no ascites, no appreciable mass Extremities: Trace bilateral lower extremity edema  CBC: Recent Labs  Lab 12/30/17 0539 12/31/17 0405 12/31/17 0734 01/01/18 0450  WBC 10.0 7.9  --  8.8  HGB 7.7* 7.0* 7.6* 8.1*  HCT 24.4* 22.6* 23.9* 25.9*  MCV 92.8 93.4  --  93.5  PLT 134* 121*  --  992*   Basic Metabolic Panel: Recent Labs  Lab 12/26/17 0400  12/27/17 2223  12/30/17 0430 12/31/17 0405  01/01/18 0450  NA 151*   < > 152*   < > 152* 154* 150*  K 3.5   < > 3.4*   < > 3.7 3.8 4.1  CL 107   < > 109   < > 111 114* 108  CO2 37*   < > 37*   < > 35* 36* 35*  GLUCOSE 123*   < > 112*   < > 96 119* 120*  BUN 62*   < > 60*   < > 54* 56* 57*  CREATININE 1.32*   < > 1.14*   < > 1.06* 1.07* 1.28*  CALCIUM 8.3*   < > 8.2*   < > 8.4* 8.2* 8.7*  MG 2.4  --  2.4  --   --   --   --   PHOS 2.7  --   --   --   --   --   --    < > = values in this interval not displayed.   GFR: Estimated Creatinine Clearance: 32.6 mL/min (A) (by C-G formula based on SCr of 1.28 mg/dL (H)).  Liver Function Tests: No results for input(s): AST, ALT, ALKPHOS, BILITOT, PROT, ALBUMIN in the last 168 hours. No results for input(s): LIPASE, AMYLASE in the last 168 hours. No results for input(s): AMMONIA in the last 168 hours.  Coagulation Profile: Recent Labs  Lab 12/29/17 0755  INR 1.20    HbA1C: Hgb A1c MFr Bld  Date/Time Value Ref Range Status  12/12/2008 08:21 AM 6.3 %   09/06/2008 08:35 AM 6.1 %     CBG: Recent Labs  Lab 12/31/17 1957 12/31/17 2050 01/01/18 0018 01/01/18 0404 01/01/18 0749  GLUCAP 134* 154* 123* 116* 103*    Recent Results (from the past 240 hour(s))  Culture, bal-quantitative     Status: Abnormal   Collection Time: 12/22/17  4:02 PM  Result Value Ref Range Status   Specimen Description BRONCHIAL ALVEOLAR LAVAGE RIGHT UPPER  Final   Special Requests RUL  Final   Gram Stain   Final    FEW WBC PRESENT,BOTH PMN AND MONONUCLEAR NO ORGANISMS SEEN    Culture (A)  Final    20,000 COLONIES/mL Consistent with normal respiratory flora. Performed at [facility], [place] [address], [place], [place] [address]    Report Status 12/25/2017 FINAL  Final  Culture, bal-quantitative     Status: Abnormal   Collection Time: 12/22/17  4:03 PM  Result Value Ref Range Status   Specimen Description BRONCHIAL ALVEOLAR LAVAGE  Final   Special Requests RLL  Final   Gram Stain   Final     ABUNDANT WBC PRESENT,BOTH PMN AND MONONUCLEAR NO ORGANISMS SEEN    Culture (A)  Final    40,000 COLONIES/mL Consistent with normal respiratory flora. Performed at [facility], [place] [address] [place], [place] [address]    Report Status 12/25/2017 FINAL  Final  Culture, Urine  Status: None   Collection Time: 12/22/17  4:25 PM  Result Value Ref Range Status   Specimen Description URINE, CATHETERIZED  Final   Special Requests Normal  Final   Culture   Final    NO GROWTH Performed at [facility], [address], [place], [place] [address]    Report Status 12/23/2017 FINAL  Final  Body fluid culture     Status: None   Collection Time: 12/24/17  3:00 PM  Result Value Ref Range Status   Specimen Description PLEURAL FLUID  Final   Special Requests RIGHT  Final   Gram Stain   Final    WBC PRESENT,BOTH PMN AND MONONUCLEAR NO ORGANISMS SEEN CYTOSPIN SMEAR    Culture   Final    NO GROWTH 3 DAYS Performed at [facility], [place] [address], [place], [place] [address]    Report Status 12/28/2017 FINAL  Final  C difficile quick scan w PCR reflex     Status: None   Collection Time: 12/26/17 10:41 PM  Result Value Ref Range Status   C Diff antigen NEGATIVE NEGATIVE Final   C Diff toxin NEGATIVE NEGATIVE Final   C Diff interpretation No C. difficile detected.  Final    Comment: Performed at [facility], [place] [address] [place], [place] [address]     Scheduled Meds: . atorvastatin  20 mg Oral q1800  . budesonide (PULMICORT) nebulizer solution  0.5 mg Nebulization BID  . chlorhexidine  15 mL Mouth Rinse BID  . donepezil  10 mg Oral QHS  . insulin aspart  1-3 Units Subcutaneous Q4H  . ipratropium-albuterol  3 mL Nebulization Q6H  . mouth rinse  15 mL Mouth Rinse q12n4p  . mirtazapine  30 mg Oral QHS  . multivitamin with minerals  1 tablet Oral Daily  . pantoprazole sodium  40 mg Per Tube Daily  . sodium chloride  2 spray Each Nare Q8H  . sodium chloride  flush  10-40 mL Intracatheter Q12H  . sodium chloride HYPERTONIC  4 mL Nebulization BID     LOS: 11 days   Cherene Altes, MD [facility] Office  [phone] Pager - Text Page per Shea Evans  If 7PM-7AM, please contact night-coverage per Amion 01/01/2018, 9:17 AM

## 2018-01-01 NOTE — Progress Notes (Signed)
After being placed on the CPAP, the patient was still unable to maintain they're O2 saturation >92%, staying mostly in the mid to upper 80's. The patient began to cough and wasn't able to get the sputum up and out with the mask on, so the mask was removed and RT was called.  On call MD Opyd was notified of the patient's inability to tolerate the CPAP and an order for heated HFNC was placed. The heated HFNC was initiated at 0230.  Rapid Response notified to put the patient on their radar at 0207 of the patient's steady decline. The RRN was on the unit at approximately 0300.  The patient continued to have issues maintaining they're O2 saturation above 90%. ELink was notified and an ABG was ordered.   PCCM MD Scatliffe and NP Veleta MinersOllis came up the the unit at approximately 0400. They assessed the patient, adjusted their oxygen setting, and performed deep suctioning which helped improve the patients O2 saturation greatly.   New orders have been placed by PCCM and the patient is now resting and in no apparent distress.   Will continue to monitor the patient for any changes in respiratory status.

## 2018-01-02 LAB — GLUCOSE, CAPILLARY
Glucose-Capillary: 119 mg/dL — ABNORMAL HIGH (ref 70–99)
Glucose-Capillary: 124 mg/dL — ABNORMAL HIGH (ref 70–99)
Glucose-Capillary: 115 mg/dL — ABNORMAL HIGH (ref 70–99)
Glucose-Capillary: 110 mg/dL — ABNORMAL HIGH (ref 70–99)

## 2018-01-02 MED ORDER — MORPHINE SULFATE (PF) 2 MG/ML IV SOLN
2.0000 mg | INTRAVENOUS | Status: DC | PRN
  Administered 2018-01-02 – 2018-01-03 (×2): 2 mg via INTRAVENOUS
  Filled 2018-01-02 (×2): qty 1

## 2018-01-02 MED ORDER — LORAZEPAM 2 MG/ML IJ SOLN
0.5000 mg | INTRAMUSCULAR | Status: DC | PRN
  Administered 2018-01-03: 1 mg via INTRAVENOUS
  Filled 2018-01-02: qty 1

## 2018-01-02 MED ORDER — LORAZEPAM 2 MG/ML IJ SOLN: 1.0000 mg | INTRAMUSCULAR | Status: DC | PRN

## 2018-01-02 MED ORDER — FUROSEMIDE 10 MG/ML IJ SOLN
60.0000 mg | Freq: Once | INTRAMUSCULAR | Status: AC
  Administered 2018-01-02: 60 mg via INTRAVENOUS
  Filled 2018-01-02: qty 6

## 2018-01-02 NOTE — Progress Notes (Signed)
[facility] - Stepdown/ICU TEAM  Brittney Li  [id number]:[id number] DOB: 03/18/1930 DOA: 2017/07/27 PCP: Avon GullyFanta, Tesfaye, MD    Brief Narrative:  84 yr old female with a hx of diastolic CHF, dementia, and COPD on 3 L home O2 who presented [facility] with a pneumonia and acute respiratory failure. She was intubated and has been extubated since 12/29/17.   Significant Events: 7/23 [facility]24 resp arrest - hemoptysis, intubated, [facility] 7/25 GI consult, no EGD needed 7/25 TTE - EF 55-60% - grade 2 DD 7/26 Thoracentesis showing transudative fluid 7/29 addition of neosynephrine for MAP<60 unresponsive to fluid bolus 7/30 Bloody secretions from ETT and OGT 7/31 extubated   Subjective: The patient has suffered with further decline overnight.  She is approaching maximum high flow nasal cannula oxygen support and saturations persist in the low 80s and upper 70s.  She is alert conversant and oriented.  She denies any significant respiratory distress or uncontrolled pain.  She does not appear anxious.  I had a very frank discussion with Brittney Li and have explained to her that unfortunately we have nothing further to offer in regard to medical therapy and that I fear she is dying.  She voiced understanding.  Offered to transition to comfort focused care but she made it clear to me that she does not wish to be sedated or altered at this time.  She actually denies any significant sensation of dyspnea or discomfort.  I asked if I could call her son who lives in [place], [place] to update him and she asked that I not do that "just yet."  She told me she has already made all of her own funeral arrangements at [employer] [facility] home in [place].  She is a very pleasant lady with a strong Christian faith.    Assessment & Plan:  HCAP Has completed a course of broad empiric antibiotic therapy  Acute hypoxic respiratory failure ongoing hypoxemia due to pneumonia  and emphysema - aggressive pulmonary toilet as per PCCM - continue high flow nasal cannula oxygen - she is declining at this point - we will continue our current measures, but have also placed prn morphine and ativan orders to prevent her from suffering should her condition decline further as expected   Acute pulmonary edema - diastolic CHF exacerbation It appears the patient has been diuresed as much as she can be - continue to hold diuretic  Hypernatremia Related to diuresis as well as poor intake - improving - now off maintenance IVF for fear of developing pulmonary edema    Hemoptysis Hgb is stable - holding anticoagulants - suspect posterior pharyngeal injury or proximal tracheal injury given negative bronch and no clear evidence of a GIB - appears to have ceased at this time   Acute kidney injury Holding diuretic - renal fxn is worsening   Recent Labs  Lab 12/28/17 0500 12/29/17 0403 12/30/17 0430 12/31/17 0405 01/01/18 0450  CREATININE Li.14* Li.02* Li.06* Li.07* Li.28*    DVT prophylaxis: SCDs Code Status: DNR - NO CODE Family Communication: no family present at time of exam - pt asks that I not call family  Disposition Plan: SDU  Consultants:  PCCM GI  Antimicrobials:  None presently    Objective: Blood pressure 100/69, pulse 71, temperature 98.2 F (36.8 C), temperature source Axillary, resp. rate 20, height 5' (Li.524 m), weight 95.4 kg (210 lb 5.Li oz), SpO2 (!) 85 %.  Intake/Output Summary (  Last 24 hours) at 01/02/2018 0955 Last data filed at 01/02/2018 0751 Gross per 24 hour  Intake 240 ml  Output 1250 ml  Net -1010 ml   Filed Weights   12/31/17 0500 12/31/17 1452 01/01/18 0500  Weight: 89.8 kg (197 lb 15.6 oz) 94.9 kg (209 lb 3.5 oz) 95.4 kg (210 lb 5.Li oz)    Examination: General: appears to be in signif resp distress (though pt herself denies) Lungs: very limited air movement th/o B fields - no active wheezing  Cardiovascular: RRR - no M or rub  Abdomen:  NT/ND, soft, bs+, no mass  Extremities: Trace bilateral lower extremity edema w/o signif change   CBC: Recent Labs  Lab 12/30/17 0539 12/31/17 0405 12/31/17 0734 01/01/18 0450  WBC 10.0 7.9  --  8.8  HGB 7.7* 7.0* 7.6* 8.Li*  HCT 24.4* 22.6* 23.9* 25.9*  MCV 92.8 93.4  --  93.5  PLT 134* 121*  --  133*   Basic Metabolic Panel: Recent Labs  Lab 12/27/17 2223  12/30/17 0430 12/31/17 0405 01/01/18 0450  NA 152*   < > 152* 154* 150*  K 3.4*   < > 3.7 3.8 4.Li  CL 109   < > 111 114* 108  CO2 37*   < > 35* 36* 35*  GLUCOSE 112*   < > 96 119* 120*  BUN 60*   < > 54* 56* 57*  CREATININE Li.14*   < > Li.06* Li.07* Li.28*  CALCIUM 8.2*   < > 8.4* 8.2* 8.7*  MG 2.4  --   --   --   --    < > = values in this interval not displayed.   GFR: Estimated Creatinine Clearance: 32.6 mL/min (A) (by C-G formula based on SCr of Li.28 mg/dL (H)).   Coagulation Profile: Recent Labs  Lab 12/29/17 0755  INR Li.20    HbA1C: Hgb A1c MFr Bld  Date/Time Value Ref Range Status  12/12/2008 08:21 AM 6.3 %   09/06/2008 08:35 AM 6.Li %     CBG: Recent Labs  Lab 01/01/18 0404 01/01/18 0749 01/01/18 1209 01/01/18 1615 01/02/18 0717  GLUCAP 116* 103* 121* 118* 119*    Recent Results (from the past 240 hour(s))  Body fluid culture     Status: None   Collection Time: 12/24/17  3:00 PM  Result Value Ref Range Status   Specimen Description PLEURAL FLUID  Final   Special Requests RIGHT  Final   Gram Stain   Final    WBC PRESENT,BOTH PMN AND MONONUCLEAR NO ORGANISMS SEEN CYTOSPIN SMEAR    Culture   Final    NO GROWTH 3 DAYS Performed at [facility], [address], [place], [place] [address]    Report Status 12/28/2017 FINAL  Final  C difficile quick scan w PCR reflex     Status: None   Collection Time: 12/26/17 10:41 PM  Result Value Ref Range Status   C Diff antigen NEGATIVE NEGATIVE Final   C Diff toxin NEGATIVE NEGATIVE Final   C Diff interpretation No C. difficile detected.   Final    Comment: Performed at [facility], [address] [place], [place] [address]     Scheduled Meds: . atorvastatin  20 mg Oral q1800  . budesonide (PULMICORT) nebulizer solution  0.5 mg Nebulization BID  . chlorhexidine  15 mL Mouth Rinse BID  . donepezil  10 mg Oral QHS  . ipratropium-albuterol  3 mL Nebulization Q6H  .  mouth rinse  15 mL Mouth Rinse q12n4p  . mirtazapine  30 mg Oral QHS  . multivitamin with minerals  Li tablet Oral Daily  . pantoprazole  40 mg Oral Q1200  . sodium chloride  2 spray Each Nare Q8H  . sodium chloride flush  10-40 mL Intracatheter Q12H  . sodium chloride HYPERTONIC  4 mL Nebulization BID     LOS: 12 days   Lonia Blood, MD [facility] Office  [phone] Pager - Text Page per Loretha Stapler  If 7PM-7AM, please contact night-coverage per Amion 01/02/2018, 9:55 AM

## 2018-01-02 NOTE — Progress Notes (Signed)
Pt placed on BiPAP 12/5 100% due to increased WOB and continued decreased SPO2 (70s) while on Optiflow 100% 60 LPM.  RN and MD made aware.  Pt tolerating well at this time. RT will continue to monitor and wean as tolerated.

## 2018-01-02 NOTE — Progress Notes (Deleted)
Pt on RA and SPO2 was 86% when RT checked her SATS.  Placed pt on 2L [place] and SPO2 is now 92%. Will continue to monitor.

## 2018-01-02 NOTE — Progress Notes (Signed)
[facility] PCCM  Seen briefly this afternoon, events of last 48 hours noted  On exam: O2 saturation 71% on high flow O2 Some respiratory distress Grey discoloration of skin  I indicated we would make her comfortable, she agreed.  Advised RN to give PRN morphine  Heber CarolinaBrent Vashawn Ekstein, MD [facility]M Pager: [phone] Cell: [phone] After 3pm or if no response, call [phone]

## 2018-01-02 NOTE — Progress Notes (Signed)
Morphine 2mg  given IV, per order and conversation with patient, pulmonology and Dr. Sharon SellerMcClung. Patient continues to desaturate on HFNC @60 % FiO2. Patient is now agreeable to trying a mask. She indicated she was waiting for family earlier, but is getting weaker now. She is mostly breathing through her mouth and having periodic apneic spells. Dr. Sharon SellerMcClung stated RT could attempt BiPAP and a Morphine drip may be indicated soon.     01/02/18 1257  Vitals  Pulse Rate 76  ECG Heart Rate 75  Resp (!) 27  Oxygen Therapy  SpO2 (!) 72 %

## 2018-01-12 ENCOUNTER — Ambulatory Visit: Payer: Medicare Other | Admitting: Student

## 2018-01-13 IMAGING — CT CT ANGIO CHEST
1 of 6 series · 5 of 36 positions shown · IV contrast (Omnipaque 300)
Comparison: 03/01/2013

CLINICAL DATA: Possible pulmonary hypertension

EXAM:
CT ANGIOGRAPHY CHEST WITH CONTRAST
TECHNIQUE: Multidetector CT imaging of the chest was performed using the
standard protocol during bolus administration of intravenous
contrast. Multiplanar CT image reconstructions and MIPs were
obtained to evaluate the vascular anatomy.
CONTRAST:  100 mL Isovue 370.

[Series 4: pe 3.0 b40f · axial · 0.63mm/px · z∈[-231,-60]mm · 5 of 87 slices shown]
[im 15/87  lung]
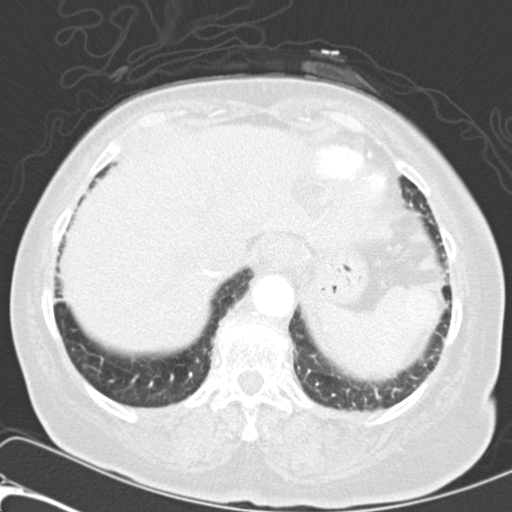
[im 29/87  mediastinal]
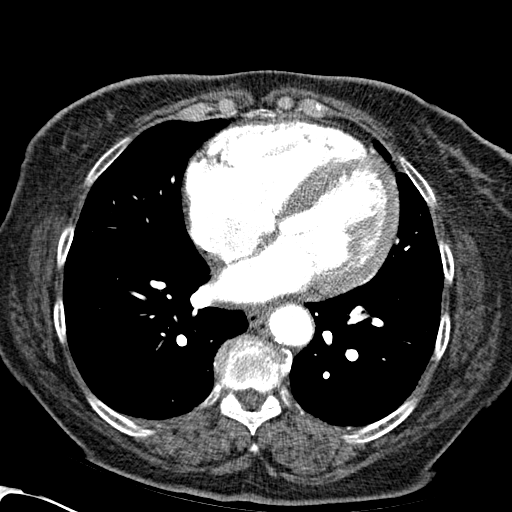
[im 44/87  lung]
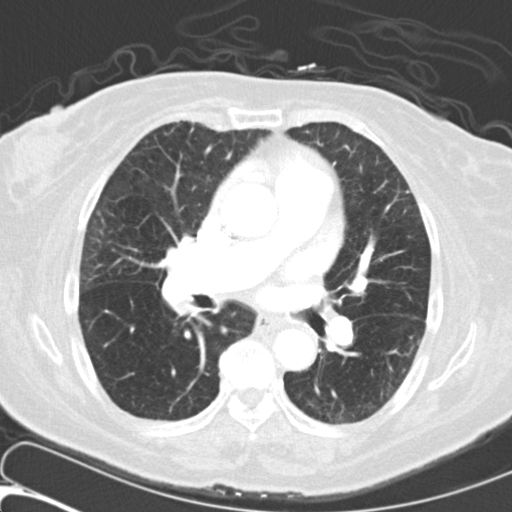
[im 58/87  mediastinal]
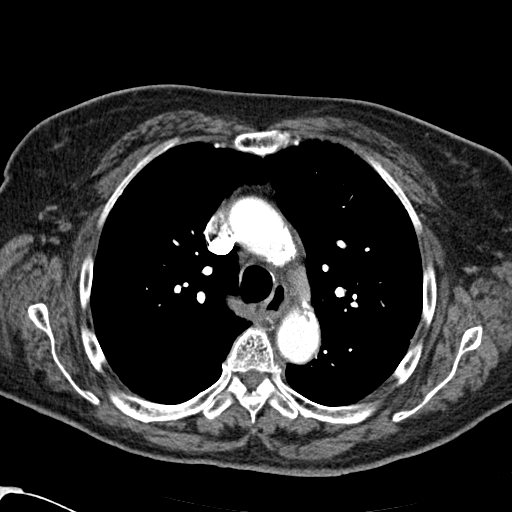
[im 72/87  lung]
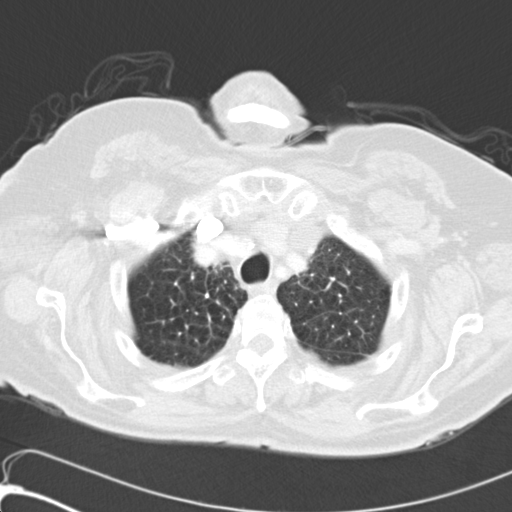

[5 of 36 positions shown; findings below may reference images not displayed]

FINDINGS: The lungs are well aerated bilaterally and demonstrate very mild
emphysematous changes. No focal infiltrate or sizable effusion is
seen. No parenchymal nodules are noted. The thoracic inlet is within
normal limits. The thoracic aorta and its branches demonstrate
calcific changes without aneurysmal dilatation or dissection. Heavy
coronary calcifications are seen. Pulmonary artery shows a normal
branching pattern without evidence of pulmonary embolism. Central
pulmonary arteries are prominent consistent with the given clinical
history of pulmonary hypertension. No significant hilar or
mediastinal adenopathy is noted. The upper abdomen demonstrates
hypodensities within the left kidney and left lobe of the liver
consistent with cysts.

Review of the MIP images confirms the above findings.
IMPRESSION: No pulmonary embolism is identified. Prominent central pulmonary
arteries are seen consistent with the given clinical history of
pulmonary hypertension.

Mild emphysematous changes.

## 2018-01-30 NOTE — Discharge Summary (Addendum)
   Death Summary   Charlton HawsMary A Hegner [id number] DOB: 01/26/1930 DOA: 06/08/17  PCP: Avon GullyFanta, Tesfaye, MD  Admit date: 06/08/17 Date of Death: 01/03/2018  Final Diagnoses:  Principal Problem:   Hospital acquired PNA Active Problems:   Dementia   Essential hypertension   BRONCHITIS, CHRONIC NOS   Acute on chronic diastolic (congestive) heart failure (HCC)   Hyperkalemia   Acute on chronic respiratory failure with hypoxia (HCC)   CKD (chronic kidney disease) stage 3, GFR 30-59 ml/min (HCC)   Elevated troponin   CHF (congestive heart failure), NYHA class I, acute on chronic, diastolic (HCC)   Dementia due to Parkinson's disease without behavioral disturbance (HCC)   Encounter for intubation   Acute renal failure superimposed on stage 3 chronic kidney disease (HCC)   Hemoptysis   Aspiration pneumonia (HCC)   Acute respiratory failure with hypoxia (HCC)   Hematemesis without nausea   Acute encephalopathy   Nausea and vomiting   Shock circulatory (HCC)   Malnutrition of moderate degree   Pleural effusion, right   History of present illness:  84 yr old female with a hx of diastolic CHF, dementia, and COPD on 3 L home O2 who presented from [facility] with a pneumonia and acute respiratory failure.   Hospital Course:  The patient [facility] where she was found to be in significant respiratory distress.  Aggressive treatment was initiated.  The day following her admission she suffered a full-blown respiratory arrest and had to be intubated.  She was therefore emergently transferred to [facility].  She underwent aggressive medical therapy with vasoactive pressor support and thoracentesis.  Ultimately she was able to be extubated on 12/29/17.  Unfortunately her severe baseline emphysema proved to be quite difficult to manage.  While she initially improved she quickly developed recurrent acute hypoxic respiratory failure in the setting of chronic hypoxic  respiratory failure.  The patient made it very clear that she did not wish to go back on the ventilator.  She wanted to continue conservative medical care in hopes of improvement but realizing that she likely will not improve.  Unfortunately her respiratory distress slowly and progressively worsened and the patient ultimately died as a result.   Signed:  Lonia BloodJeffrey T Cambren Helm  [facility] 01/18/2018, 7:23 PM

## 2018-01-30 NOTE — Progress Notes (Signed)
RT at bedside patient pulled off bipap and O2 saturations dropped down to 50's. HHF 100% and non-rebreather mask HOB elevated and placed back on bipap sats remain 55 ativan IV given

## 2018-01-30 NOTE — Progress Notes (Signed)
Call placed to son and wife notified that patient condition has declined. Respiratory saturations 50-60s RT @bedside  and MD made aware. All measures taken to provide comfort. Ativan and Morphine IV given as ordered. Reported to On coming RN.

## 2018-01-30 NOTE — Care Management Note (Addendum)
Case Management Note  Patient Details  Name: Brittney Li MRN: [id number] Date of Birth: 07/06/1929  Subjective/Objective:    From  [facility] presents with HCAP.  Patient expired.               Action/Plan: NCM will follow for transition of care needs.   Expected Discharge Date:                  Expected Discharge Plan:  Skilled Nursing Facility  In-House Referral:  Clinical Social Work  Discharge planning Services  CM Consult  Post Acute Care Choice:    Choice offered to:     DME Arranged:    DME Agency:     HH Arranged:    HH Agency:     Status of Service:  In process, will continue to follow  If discussed at Long Length of Stay Meetings, dates discussed:    Additional Comments:  Leone Havenaylor, Martine Bleecker [place], RN 01/01/2018, 10:59 AM

## 2018-01-30 NOTE — Progress Notes (Signed)
Late Entry: 0705- RT called to the pts bedside for pt desat. RT times 2 at the bedside and RN. RT found pt on BIPAP 12/6, 100, shallow agonal breathing on the BIPAP sats 60s, HR 75, Bp 91/73. MD paged by RT. 0718-RT spoke with Dr Sharon SellerMcClung via phone in regards to pt condition. Per MD for RT to remove BIPAP and place pt on [place] for comfort care. RN notified. 0725-RT placed pt on 6L [place] for comfort care per MD order. RN aware

## 2018-01-30 NOTE — Progress Notes (Signed)
RT at bedside to give schedule tx.  Pt's SP02 is in the low 70's while on HFNC 100% and 50L with some increase WOB.  Patient placed on Bipap 12/6 100%.  Pt's SP02 in now 90%.  RN made aware.

## 2018-01-30 DEATH — deceased
